# Patient Record
Sex: Female | Born: 1940 | Race: White | Hispanic: No | Marital: Married | Smoking: Current some day smoker
Health system: Southern US, Community
[De-identification: ages and names within clinical notes are randomized; demographics above are authoritative.]

## PROBLEM LIST (undated history)

## (undated) DIAGNOSIS — Z860100 Personal history of colon polyps, unspecified: Secondary | ICD-10-CM

## (undated) DIAGNOSIS — F329 Major depressive disorder, single episode, unspecified: Secondary | ICD-10-CM

## (undated) DIAGNOSIS — R0602 Shortness of breath: Secondary | ICD-10-CM

## (undated) DIAGNOSIS — K56600 Partial intestinal obstruction, unspecified as to cause: Secondary | ICD-10-CM

## (undated) DIAGNOSIS — K219 Gastro-esophageal reflux disease without esophagitis: Secondary | ICD-10-CM

## (undated) DIAGNOSIS — M1990 Unspecified osteoarthritis, unspecified site: Secondary | ICD-10-CM

## (undated) DIAGNOSIS — I639 Cerebral infarction, unspecified: Secondary | ICD-10-CM

## (undated) DIAGNOSIS — J449 Chronic obstructive pulmonary disease, unspecified: Secondary | ICD-10-CM

## (undated) DIAGNOSIS — Z853 Personal history of malignant neoplasm of breast: Secondary | ICD-10-CM

## (undated) DIAGNOSIS — R011 Cardiac murmur, unspecified: Secondary | ICD-10-CM

## (undated) DIAGNOSIS — C50919 Malignant neoplasm of unspecified site of unspecified female breast: Secondary | ICD-10-CM

## (undated) DIAGNOSIS — Z515 Encounter for palliative care: Secondary | ICD-10-CM

## (undated) DIAGNOSIS — I341 Nonrheumatic mitral (valve) prolapse: Secondary | ICD-10-CM

## (undated) DIAGNOSIS — F32A Depression, unspecified: Secondary | ICD-10-CM

## (undated) DIAGNOSIS — R188 Other ascites: Secondary | ICD-10-CM

## (undated) DIAGNOSIS — I499 Cardiac arrhythmia, unspecified: Secondary | ICD-10-CM

## (undated) DIAGNOSIS — C569 Malignant neoplasm of unspecified ovary: Secondary | ICD-10-CM

## (undated) DIAGNOSIS — F419 Anxiety disorder, unspecified: Secondary | ICD-10-CM

## (undated) DIAGNOSIS — C801 Malignant (primary) neoplasm, unspecified: Secondary | ICD-10-CM

## (undated) HISTORY — DX: Chronic obstructive pulmonary disease, unspecified: J44.9

## (undated) HISTORY — DX: Partial intestinal obstruction, unspecified as to cause: K56.600

## (undated) HISTORY — PX: TYMPANOPLASTY: SHX33

## (undated) HISTORY — DX: Gastro-esophageal reflux disease without esophagitis: K21.9

## (undated) HISTORY — DX: Personal history of colonic polyps: Z86.010

## (undated) HISTORY — DX: Malignant (primary) neoplasm, unspecified: C80.1

## (undated) HISTORY — DX: Cerebral infarction, unspecified: I63.9

## (undated) HISTORY — DX: Other ascites: R18.8

## (undated) HISTORY — PX: LAPAROSCOPY: SHX197

## (undated) HISTORY — DX: Unspecified osteoarthritis, unspecified site: M19.90

## (undated) HISTORY — PX: OTHER SURGICAL HISTORY: SHX169

## (undated) HISTORY — DX: Personal history of colon polyps, unspecified: Z86.0100

## (undated) HISTORY — DX: Personal history of malignant neoplasm of breast: Z85.3

---
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
## 1991-08-06 DIAGNOSIS — C50919 Malignant neoplasm of unspecified site of unspecified female breast: Secondary | ICD-10-CM

## 1991-08-06 HISTORY — DX: Malignant neoplasm of unspecified site of unspecified female breast: C50.919

## 1998-11-13 ENCOUNTER — Other Ambulatory Visit: Admission: RE | Admit: 1998-11-13 | Discharge: 1998-11-13 | Payer: Self-pay | Admitting: Obstetrics & Gynecology

## 2000-01-25 ENCOUNTER — Other Ambulatory Visit: Admission: RE | Admit: 2000-01-25 | Discharge: 2000-01-25 | Payer: Self-pay | Admitting: Obstetrics & Gynecology

## 2000-03-10 ENCOUNTER — Encounter: Admission: RE | Admit: 2000-03-10 | Discharge: 2000-03-10 | Payer: Self-pay | Admitting: Oncology

## 2000-03-10 ENCOUNTER — Encounter: Payer: Self-pay | Admitting: Oncology

## 2001-03-23 ENCOUNTER — Encounter: Payer: Self-pay | Admitting: Oncology

## 2001-03-23 ENCOUNTER — Encounter: Admission: RE | Admit: 2001-03-23 | Discharge: 2001-03-23 | Payer: Self-pay | Admitting: Oncology

## 2001-04-13 ENCOUNTER — Ambulatory Visit (HOSPITAL_COMMUNITY): Admission: RE | Admit: 2001-04-13 | Discharge: 2001-04-13 | Payer: Self-pay | Admitting: Oncology

## 2001-04-13 ENCOUNTER — Encounter: Payer: Self-pay | Admitting: Oncology

## 2001-06-03 ENCOUNTER — Other Ambulatory Visit: Admission: RE | Admit: 2001-06-03 | Discharge: 2001-06-03 | Payer: Self-pay | Admitting: Obstetrics & Gynecology

## 2002-03-24 ENCOUNTER — Encounter: Admission: RE | Admit: 2002-03-24 | Discharge: 2002-03-24 | Payer: Self-pay | Admitting: Oncology

## 2002-03-24 ENCOUNTER — Encounter: Payer: Self-pay | Admitting: Oncology

## 2002-06-23 ENCOUNTER — Other Ambulatory Visit: Admission: RE | Admit: 2002-06-23 | Discharge: 2002-06-23 | Payer: Self-pay | Admitting: Obstetrics & Gynecology

## 2003-09-13 ENCOUNTER — Other Ambulatory Visit: Admission: RE | Admit: 2003-09-13 | Discharge: 2003-09-13 | Payer: Self-pay | Admitting: Obstetrics & Gynecology

## 2003-09-19 ENCOUNTER — Encounter: Admission: RE | Admit: 2003-09-19 | Discharge: 2003-09-19 | Payer: Self-pay | Admitting: Obstetrics & Gynecology

## 2004-10-22 ENCOUNTER — Ambulatory Visit: Payer: Self-pay | Admitting: Internal Medicine

## 2005-01-22 ENCOUNTER — Ambulatory Visit: Payer: Self-pay | Admitting: Internal Medicine

## 2005-02-07 ENCOUNTER — Encounter: Admission: RE | Admit: 2005-02-07 | Discharge: 2005-02-07 | Payer: Self-pay | Admitting: Obstetrics & Gynecology

## 2005-02-12 ENCOUNTER — Other Ambulatory Visit: Admission: RE | Admit: 2005-02-12 | Discharge: 2005-02-12 | Payer: Self-pay | Admitting: Obstetrics & Gynecology

## 2005-04-12 ENCOUNTER — Ambulatory Visit: Payer: Self-pay | Admitting: Internal Medicine

## 2005-04-24 ENCOUNTER — Ambulatory Visit: Payer: Self-pay | Admitting: Internal Medicine

## 2005-08-21 ENCOUNTER — Ambulatory Visit: Payer: Self-pay | Admitting: Internal Medicine

## 2005-09-02 ENCOUNTER — Ambulatory Visit: Payer: Self-pay | Admitting: Internal Medicine

## 2005-12-13 ENCOUNTER — Ambulatory Visit: Payer: Self-pay | Admitting: Internal Medicine

## 2005-12-16 ENCOUNTER — Ambulatory Visit: Payer: Self-pay | Admitting: Internal Medicine

## 2006-03-28 ENCOUNTER — Ambulatory Visit: Payer: Self-pay | Admitting: Internal Medicine

## 2006-04-02 ENCOUNTER — Ambulatory Visit: Payer: Self-pay | Admitting: Internal Medicine

## 2006-06-06 ENCOUNTER — Ambulatory Visit: Payer: Self-pay | Admitting: Internal Medicine

## 2006-07-01 ENCOUNTER — Ambulatory Visit: Payer: Self-pay | Admitting: Internal Medicine

## 2006-07-01 LAB — CONVERTED CEMR LAB
ALT: 23 units/L (ref 0–40)
AST: 24 units/L (ref 0–37)
Alkaline Phosphatase: 69 units/L (ref 39–117)
Chloride: 102 meq/L (ref 96–112)
Glomerular Filtration Rate, Af Am: 81 mL/min/{1.73_m2}
Glucose, Bld: 162 mg/dL — ABNORMAL HIGH (ref 70–99)
HDL: 54.9 mg/dL (ref >39.0)
Potassium: 5 meq/L (ref 3.5–5.1)
Sodium: 139 meq/L (ref 135–145)
Total Bilirubin: 0.5 mg/dL (ref 0.3–1.2)
Total Protein: 6.8 g/dL (ref 6.0–8.3)
VLDL: 28 mg/dL (ref 0–40)

## 2006-07-02 ENCOUNTER — Ambulatory Visit: Payer: Self-pay | Admitting: Internal Medicine

## 2006-09-30 ENCOUNTER — Ambulatory Visit: Payer: Self-pay | Admitting: Internal Medicine

## 2006-09-30 LAB — CONVERTED CEMR LAB
ALT: 20 units/L (ref 0–40)
AST: 24 units/L (ref 0–37)
Albumin: 3.7 g/dL (ref 3.5–5.2)
BUN: 10 mg/dL (ref 6–23)
Calcium: 9.4 mg/dL (ref 8.4–10.5)
Chloride: 104 meq/L (ref 96–112)
GFR calc Af Amer: 109 mL/min
GFR calc non Af Amer: 90 mL/min
LDL Cholesterol: 65 mg/dL (ref 0–99)
Total CHOL/HDL Ratio: 2.9
VLDL: 28 mg/dL (ref 0–40)
Vit D, 1,25-Dihydroxy: 48 (ref 20–57)

## 2006-10-03 ENCOUNTER — Ambulatory Visit: Payer: Self-pay | Admitting: Internal Medicine

## 2006-10-20 ENCOUNTER — Ambulatory Visit: Payer: Self-pay

## 2007-01-06 ENCOUNTER — Ambulatory Visit: Payer: Self-pay | Admitting: Internal Medicine

## 2007-01-06 LAB — CONVERTED CEMR LAB
ALT: 20 units/L (ref 0–40)
Alkaline Phosphatase: 70 units/L (ref 39–117)
Bilirubin, Direct: 0.1 mg/dL (ref 0.0–0.3)
Cholesterol: 165 mg/dL (ref 0–200)
HDL: 53.7 mg/dL (ref >39.0)
LDL Cholesterol: 84 mg/dL (ref 0–99)
Total Bilirubin: 0.6 mg/dL (ref 0.3–1.2)
Total CHOL/HDL Ratio: 3.1
Total Protein: 7 g/dL (ref 6.0–8.3)

## 2007-01-09 ENCOUNTER — Ambulatory Visit: Payer: Self-pay | Admitting: Internal Medicine

## 2007-02-17 ENCOUNTER — Encounter: Admission: RE | Admit: 2007-02-17 | Discharge: 2007-02-17 | Payer: Self-pay | Admitting: Obstetrics & Gynecology

## 2007-04-10 ENCOUNTER — Ambulatory Visit: Payer: Self-pay | Admitting: Internal Medicine

## 2007-04-10 LAB — CONVERTED CEMR LAB
BUN: 14 mg/dL (ref 6–23)
CO2: 27 meq/L (ref 19–32)
Calcium: 9.8 mg/dL (ref 8.4–10.5)
GFR calc Af Amer: 108 mL/min
GFR calc non Af Amer: 90 mL/min
Glucose, Bld: 130 mg/dL — ABNORMAL HIGH (ref 70–99)
Potassium: 4.6 meq/L (ref 3.5–5.1)

## 2007-04-14 ENCOUNTER — Ambulatory Visit: Payer: Self-pay | Admitting: Internal Medicine

## 2007-05-14 ENCOUNTER — Ambulatory Visit: Payer: Self-pay | Admitting: Internal Medicine

## 2007-06-16 ENCOUNTER — Encounter: Payer: Self-pay | Admitting: Internal Medicine

## 2007-06-24 ENCOUNTER — Encounter: Payer: Self-pay | Admitting: Internal Medicine

## 2007-06-24 DIAGNOSIS — K219 Gastro-esophageal reflux disease without esophagitis: Secondary | ICD-10-CM

## 2007-06-24 DIAGNOSIS — J449 Chronic obstructive pulmonary disease, unspecified: Secondary | ICD-10-CM

## 2007-06-24 DIAGNOSIS — Z853 Personal history of malignant neoplasm of breast: Secondary | ICD-10-CM

## 2007-06-24 DIAGNOSIS — J4489 Other specified chronic obstructive pulmonary disease: Secondary | ICD-10-CM | POA: Insufficient documentation

## 2007-07-20 ENCOUNTER — Ambulatory Visit: Payer: Self-pay | Admitting: Internal Medicine

## 2007-07-20 DIAGNOSIS — E119 Type 2 diabetes mellitus without complications: Secondary | ICD-10-CM

## 2007-07-20 LAB — CONVERTED CEMR LAB
ALT: 22 units/L (ref 0–35)
AST: 19 units/L (ref 0–37)
Albumin: 3.8 g/dL (ref 3.5–5.2)
Alkaline Phosphatase: 66 units/L (ref 39–117)
BUN: 12 mg/dL (ref 6–23)
CO2: 27 meq/L (ref 19–32)
Chloride: 103 meq/L (ref 96–112)
Creatinine, Ser: 0.8 mg/dL (ref 0.4–1.2)
Direct LDL: 94.7 mg/dL
HDL: 56.7 mg/dL (ref >39.0)
Total Bilirubin: 0.7 mg/dL (ref 0.3–1.2)
Total Protein: 6.3 g/dL (ref 6.0–8.3)
VLDL: 50 mg/dL — ABNORMAL HIGH (ref 0–40)

## 2007-07-22 ENCOUNTER — Ambulatory Visit: Payer: Self-pay | Admitting: Internal Medicine

## 2007-07-22 DIAGNOSIS — Z860100 Personal history of colon polyps, unspecified: Secondary | ICD-10-CM | POA: Insufficient documentation

## 2007-08-24 ENCOUNTER — Encounter: Payer: Self-pay | Admitting: Internal Medicine

## 2007-09-04 ENCOUNTER — Encounter: Payer: Self-pay | Admitting: Internal Medicine

## 2007-10-16 ENCOUNTER — Encounter: Payer: Self-pay | Admitting: Internal Medicine

## 2007-11-20 ENCOUNTER — Telehealth: Payer: Self-pay | Admitting: Internal Medicine

## 2007-11-30 ENCOUNTER — Ambulatory Visit: Payer: Self-pay | Admitting: Internal Medicine

## 2007-12-02 ENCOUNTER — Ambulatory Visit: Payer: Self-pay | Admitting: Internal Medicine

## 2007-12-02 LAB — CONVERTED CEMR LAB
Calcium: 9.2 mg/dL (ref 8.4–10.5)
GFR calc Af Amer: 93 mL/min
GFR calc non Af Amer: 77 mL/min
Glucose, Bld: 96 mg/dL (ref 70–99)
HDL: 55.4 mg/dL (ref >39.0)
LDL Cholesterol: 86 mg/dL (ref 0–99)
Potassium: 4.3 meq/L (ref 3.5–5.1)
Sodium: 139 meq/L (ref 135–145)
Total CHOL/HDL Ratio: 3.1
VLDL: 29 mg/dL (ref 0–40)

## 2007-12-03 LAB — CONVERTED CEMR LAB
Bilirubin Urine: NEGATIVE
Hemoglobin, Urine: NEGATIVE
Nitrite: NEGATIVE
Total Protein, Urine: NEGATIVE mg/dL
Urobilinogen, UA: 0.2 (ref 0.0–1.0)

## 2007-12-07 ENCOUNTER — Ambulatory Visit: Payer: Self-pay | Admitting: Internal Medicine

## 2007-12-09 ENCOUNTER — Telehealth: Payer: Self-pay | Admitting: Internal Medicine

## 2007-12-10 ENCOUNTER — Encounter (INDEPENDENT_AMBULATORY_CARE_PROVIDER_SITE_OTHER): Payer: Self-pay | Admitting: *Deleted

## 2008-01-11 ENCOUNTER — Encounter: Payer: Self-pay | Admitting: Internal Medicine

## 2008-01-14 ENCOUNTER — Encounter: Payer: Self-pay | Admitting: Internal Medicine

## 2008-01-19 ENCOUNTER — Encounter: Admission: RE | Admit: 2008-01-19 | Discharge: 2008-01-19 | Payer: Self-pay | Admitting: Surgery

## 2008-01-20 ENCOUNTER — Ambulatory Visit: Payer: Self-pay | Admitting: Internal Medicine

## 2008-01-20 DIAGNOSIS — E785 Hyperlipidemia, unspecified: Secondary | ICD-10-CM

## 2008-01-20 DIAGNOSIS — K8020 Calculus of gallbladder without cholecystitis without obstruction: Secondary | ICD-10-CM | POA: Insufficient documentation

## 2008-01-20 DIAGNOSIS — I059 Rheumatic mitral valve disease, unspecified: Secondary | ICD-10-CM | POA: Insufficient documentation

## 2008-01-22 ENCOUNTER — Encounter: Payer: Self-pay | Admitting: Internal Medicine

## 2008-01-22 ENCOUNTER — Ambulatory Visit: Payer: Self-pay | Admitting: Internal Medicine

## 2008-01-22 LAB — HM COLONOSCOPY

## 2008-01-25 ENCOUNTER — Encounter: Payer: Self-pay | Admitting: Internal Medicine

## 2008-01-27 ENCOUNTER — Encounter: Payer: Self-pay | Admitting: Internal Medicine

## 2008-02-25 ENCOUNTER — Encounter: Payer: Self-pay | Admitting: Internal Medicine

## 2008-02-26 ENCOUNTER — Ambulatory Visit: Payer: Self-pay | Admitting: Internal Medicine

## 2008-02-26 LAB — CONVERTED CEMR LAB
CO2: 27 meq/L (ref 19–32)
Calcium: 10.2 mg/dL (ref 8.4–10.5)
Creatinine, Ser: 0.8 mg/dL (ref 0.4–1.2)
GFR calc Af Amer: 93 mL/min
Glucose, Bld: 137 mg/dL — ABNORMAL HIGH (ref 70–99)
Hgb A1c MFr Bld: 7.1 % — ABNORMAL HIGH (ref 4.6–6.0)

## 2008-03-02 ENCOUNTER — Ambulatory Visit: Payer: Self-pay | Admitting: Internal Medicine

## 2008-04-25 ENCOUNTER — Ambulatory Visit: Payer: Self-pay | Admitting: Internal Medicine

## 2008-04-25 ENCOUNTER — Inpatient Hospital Stay (HOSPITAL_COMMUNITY): Admission: AD | Admit: 2008-04-25 | Discharge: 2008-04-29 | Payer: Self-pay | Admitting: Internal Medicine

## 2008-04-25 ENCOUNTER — Ambulatory Visit: Payer: Self-pay | Admitting: Cardiology

## 2008-04-25 DIAGNOSIS — R0602 Shortness of breath: Secondary | ICD-10-CM

## 2008-04-25 DIAGNOSIS — R05 Cough: Secondary | ICD-10-CM | POA: Insufficient documentation

## 2008-04-26 ENCOUNTER — Encounter: Payer: Self-pay | Admitting: Cardiology

## 2008-04-30 ENCOUNTER — Telehealth: Payer: Self-pay | Admitting: Internal Medicine

## 2008-05-06 ENCOUNTER — Encounter: Payer: Self-pay | Admitting: Internal Medicine

## 2008-05-09 ENCOUNTER — Ambulatory Visit: Payer: Self-pay | Admitting: Internal Medicine

## 2008-05-09 DIAGNOSIS — F17200 Nicotine dependence, unspecified, uncomplicated: Secondary | ICD-10-CM | POA: Insufficient documentation

## 2008-05-19 ENCOUNTER — Encounter: Payer: Self-pay | Admitting: Internal Medicine

## 2008-06-01 ENCOUNTER — Encounter: Admission: RE | Admit: 2008-06-01 | Discharge: 2008-06-01 | Payer: Self-pay | Admitting: Obstetrics & Gynecology

## 2008-06-27 ENCOUNTER — Telehealth: Payer: Self-pay | Admitting: Internal Medicine

## 2008-07-27 ENCOUNTER — Telehealth: Payer: Self-pay | Admitting: Internal Medicine

## 2008-08-08 ENCOUNTER — Telehealth: Payer: Self-pay | Admitting: Internal Medicine

## 2008-08-11 ENCOUNTER — Encounter: Payer: Self-pay | Admitting: Internal Medicine

## 2008-08-15 ENCOUNTER — Ambulatory Visit: Payer: Self-pay | Admitting: Internal Medicine

## 2008-08-15 LAB — CONVERTED CEMR LAB
CO2: 28 meq/L (ref 19–32)
Calcium: 9.6 mg/dL (ref 8.4–10.5)
Creatinine, Ser: 0.7 mg/dL (ref 0.4–1.2)
Glucose, Bld: 127 mg/dL — ABNORMAL HIGH (ref 70–99)

## 2008-08-16 ENCOUNTER — Ambulatory Visit: Payer: Self-pay | Admitting: Internal Medicine

## 2008-11-14 ENCOUNTER — Ambulatory Visit: Payer: Self-pay | Admitting: Internal Medicine

## 2008-11-16 ENCOUNTER — Ambulatory Visit: Payer: Self-pay | Admitting: Internal Medicine

## 2008-11-16 LAB — CONVERTED CEMR LAB
BUN: 14 mg/dL (ref 6–23)
CO2: 28 meq/L (ref 19–32)
Chloride: 106 meq/L (ref 96–112)
Creatinine, Ser: 0.8 mg/dL (ref 0.4–1.2)
Nitrite: NEGATIVE
Specific Gravity, Urine: 1.02 (ref 1.000–1.030)
Total Protein, Urine: NEGATIVE mg/dL
pH: 6 (ref 5.0–8.0)

## 2008-11-18 ENCOUNTER — Telehealth: Payer: Self-pay | Admitting: Internal Medicine

## 2009-03-01 ENCOUNTER — Telehealth (INDEPENDENT_AMBULATORY_CARE_PROVIDER_SITE_OTHER): Payer: Self-pay | Admitting: *Deleted

## 2009-03-20 ENCOUNTER — Ambulatory Visit: Payer: Self-pay | Admitting: Internal Medicine

## 2009-03-20 LAB — CONVERTED CEMR LAB
Bilirubin, Direct: 0.1 mg/dL (ref 0.0–0.3)
CO2: 29 meq/L (ref 19–32)
Calcium: 9.2 mg/dL (ref 8.4–10.5)
Creatinine, Ser: 0.7 mg/dL (ref 0.4–1.2)
Total Protein: 6.8 g/dL (ref 6.0–8.3)

## 2009-03-21 ENCOUNTER — Ambulatory Visit: Payer: Self-pay | Admitting: Internal Medicine

## 2009-04-12 ENCOUNTER — Encounter: Payer: Self-pay | Admitting: Internal Medicine

## 2009-04-28 ENCOUNTER — Telehealth: Payer: Self-pay | Admitting: Internal Medicine

## 2009-05-03 ENCOUNTER — Ambulatory Visit: Payer: Self-pay | Admitting: Internal Medicine

## 2009-06-05 ENCOUNTER — Encounter: Admission: RE | Admit: 2009-06-05 | Discharge: 2009-06-05 | Payer: Self-pay | Admitting: Obstetrics & Gynecology

## 2009-06-20 ENCOUNTER — Ambulatory Visit: Payer: Self-pay | Admitting: Internal Medicine

## 2009-06-20 LAB — CONVERTED CEMR LAB
GFR calc non Af Amer: 88.91 mL/min (ref >60)
Glucose, Bld: 97 mg/dL (ref 70–99)
Hgb A1c MFr Bld: 6.8 % — ABNORMAL HIGH (ref 4.6–6.5)
Potassium: 4.1 meq/L (ref 3.5–5.1)
Sodium: 142 meq/L (ref 135–145)

## 2009-06-23 ENCOUNTER — Ambulatory Visit: Payer: Self-pay | Admitting: Internal Medicine

## 2009-06-23 DIAGNOSIS — M1990 Unspecified osteoarthritis, unspecified site: Secondary | ICD-10-CM | POA: Insufficient documentation

## 2009-06-23 DIAGNOSIS — J310 Chronic rhinitis: Secondary | ICD-10-CM

## 2009-08-31 ENCOUNTER — Telehealth: Payer: Self-pay | Admitting: Internal Medicine

## 2009-09-01 ENCOUNTER — Telehealth: Payer: Self-pay | Admitting: Internal Medicine

## 2009-09-21 ENCOUNTER — Telehealth: Payer: Self-pay | Admitting: Internal Medicine

## 2009-09-29 ENCOUNTER — Ambulatory Visit: Payer: Self-pay | Admitting: Internal Medicine

## 2009-09-29 LAB — CONVERTED CEMR LAB
GFR calc non Af Amer: 88.84 mL/min (ref >60)
Glucose, Bld: 117 mg/dL — ABNORMAL HIGH (ref 70–99)
Hgb A1c MFr Bld: 7.1 % — ABNORMAL HIGH (ref 4.6–6.5)
Potassium: 4 meq/L (ref 3.5–5.1)
Sodium: 140 meq/L (ref 135–145)
Vitamin B-12: 370 pg/mL (ref 211–911)

## 2009-10-04 ENCOUNTER — Ambulatory Visit: Payer: Self-pay | Admitting: Internal Medicine

## 2010-01-16 ENCOUNTER — Ambulatory Visit: Payer: Self-pay | Admitting: Internal Medicine

## 2010-01-16 LAB — CONVERTED CEMR LAB
AST: 22 units/L (ref 0–37)
Alkaline Phosphatase: 67 units/L (ref 39–117)
Bilirubin, Direct: 0.1 mg/dL (ref 0.0–0.3)
Direct LDL: 108 mg/dL
GFR calc non Af Amer: 85.92 mL/min (ref >60)
Potassium: 4.5 meq/L (ref 3.5–5.1)
Sodium: 143 meq/L (ref 135–145)
Total Bilirubin: 0.4 mg/dL (ref 0.3–1.2)
Total CHOL/HDL Ratio: 3
VLDL: 68.4 mg/dL — ABNORMAL HIGH (ref 0.0–40.0)

## 2010-01-17 ENCOUNTER — Ambulatory Visit: Payer: Self-pay | Admitting: Internal Medicine

## 2010-01-17 DIAGNOSIS — J209 Acute bronchitis, unspecified: Secondary | ICD-10-CM

## 2010-04-30 ENCOUNTER — Telehealth: Payer: Self-pay | Admitting: Internal Medicine

## 2010-05-09 ENCOUNTER — Encounter: Payer: Self-pay | Admitting: Internal Medicine

## 2010-05-11 ENCOUNTER — Ambulatory Visit: Payer: Self-pay | Admitting: Internal Medicine

## 2010-05-11 LAB — CONVERTED CEMR LAB
Calcium: 9.7 mg/dL (ref 8.4–10.5)
Creatinine, Ser: 0.8 mg/dL (ref 0.4–1.2)
GFR calc non Af Amer: 79.43 mL/min (ref >60)
Sodium: 139 meq/L (ref 135–145)

## 2010-05-14 ENCOUNTER — Ambulatory Visit: Payer: Self-pay | Admitting: Internal Medicine

## 2010-05-14 ENCOUNTER — Encounter: Payer: Self-pay | Admitting: Internal Medicine

## 2010-05-28 ENCOUNTER — Encounter: Payer: Self-pay | Admitting: Internal Medicine

## 2010-06-06 ENCOUNTER — Encounter: Admission: RE | Admit: 2010-06-06 | Discharge: 2010-06-06 | Payer: Self-pay | Admitting: Obstetrics & Gynecology

## 2010-06-21 ENCOUNTER — Encounter: Payer: Self-pay | Admitting: Internal Medicine

## 2010-08-22 ENCOUNTER — Encounter: Payer: Self-pay | Admitting: Internal Medicine

## 2010-08-28 ENCOUNTER — Ambulatory Visit
Admission: RE | Admit: 2010-08-28 | Discharge: 2010-08-28 | Payer: Self-pay | Source: Home / Self Care | Attending: Internal Medicine | Admitting: Internal Medicine

## 2010-08-28 ENCOUNTER — Encounter (INDEPENDENT_AMBULATORY_CARE_PROVIDER_SITE_OTHER): Payer: Self-pay | Admitting: *Deleted

## 2010-08-28 DIAGNOSIS — R0989 Other specified symptoms and signs involving the circulatory and respiratory systems: Secondary | ICD-10-CM | POA: Insufficient documentation

## 2010-08-30 ENCOUNTER — Ambulatory Visit
Admission: RE | Admit: 2010-08-30 | Discharge: 2010-08-30 | Payer: Self-pay | Source: Home / Self Care | Attending: Internal Medicine | Admitting: Internal Medicine

## 2010-08-30 ENCOUNTER — Other Ambulatory Visit: Payer: Self-pay | Admitting: Internal Medicine

## 2010-08-30 LAB — CBC WITH DIFFERENTIAL/PLATELET
Basophils Absolute: 0 10*3/uL (ref 0.0–0.1)
Basophils Relative: 0.5 % (ref 0.0–3.0)
Hemoglobin: 11.9 g/dL — ABNORMAL LOW (ref 12.0–15.0)
Lymphocytes Relative: 29.2 % (ref 12.0–46.0)
Monocytes Relative: 7.9 % (ref 3.0–12.0)
Neutro Abs: 6.1 10*3/uL (ref 1.4–7.7)
RBC: 4.05 Mil/uL (ref 3.87–5.11)

## 2010-08-30 LAB — TSH: TSH: 3.3 u[IU]/mL (ref 0.35–5.50)

## 2010-08-30 LAB — URINALYSIS
Ketones, ur: NEGATIVE
Leukocytes, UA: NEGATIVE
Specific Gravity, Urine: 1.01 (ref 1.000–1.030)
Urine Glucose: NEGATIVE
pH: 7 (ref 5.0–8.0)

## 2010-08-30 LAB — BASIC METABOLIC PANEL
CO2: 29 mEq/L (ref 19–32)
Chloride: 101 mEq/L (ref 96–112)
Sodium: 138 mEq/L (ref 135–145)

## 2010-08-30 LAB — LIPID PANEL: Total CHOL/HDL Ratio: 3

## 2010-08-30 LAB — MICROALBUMIN / CREATININE URINE RATIO
Creatinine,U: 45.3 mg/dL
Microalb, Ur: 1 mg/dL (ref 0.0–1.9)

## 2010-08-30 LAB — HEPATIC FUNCTION PANEL
ALT: 23 U/L (ref 0–35)
AST: 20 U/L (ref 0–37)
Alkaline Phosphatase: 67 U/L (ref 39–117)
Bilirubin, Direct: 0.1 mg/dL (ref 0.0–0.3)
Total Protein: 6.9 g/dL (ref 6.0–8.3)

## 2010-08-30 LAB — HEMOGLOBIN A1C: Hgb A1c MFr Bld: 7.5 % — ABNORMAL HIGH (ref 4.6–6.5)

## 2010-09-04 ENCOUNTER — Encounter: Payer: Self-pay | Admitting: Internal Medicine

## 2010-09-04 NOTE — Assessment & Plan Note (Signed)
Summary: 4 mo rov /nws  #   Vital Signs:  Patient profile:   70 year old female Height:      64 inches Weight:      137.25 pounds BMI:     23.64 O2 Sat:      91 % on Room air Temp:     97.5 degrees F oral Pulse rate:   88 / minute BP sitting:   110 / 64  (left arm) Cuff size:   regular  Vitals Entered By: Lucious Groves (January 17, 2010 1:26 PM)  O2 Flow:  Room air CC: 4 mo rtn ov./kb Is Patient Diabetic? No Pain Assessment Patient in pain? no      Comments Patient states that she did not know she was supposed to be taking Vitamin D./kb   Primary Care Provider:  Dr Trinna Post Hannah Crill  CC:  4 mo rtn ov./kb.  History of Present Illness: The patient presents for a follow up of hypertension, diabetes, hyperlipidemia C/o cough and nasal drainage.  Current Medications (verified): 1)  Lovastatin 40 Mg  Tabs (Lovastatin) .... Take 1 By Mouth Qd 2)  Tenormin 50 Mg  Tabs (Atenolol) .... 1/2 Two Times A Day 3)  Zoloft 100 Mg  Tabs (Sertraline Hcl) .... Once Daily 4)  Vitamin-B Complex   Tabs (B Complex Vitamins) .... Once Daily 5)  Baby Aspirin 81 Mg  Chew (Aspirin) .... Once Daily 6)  Buspar 15 Mg  Tabs (Buspirone Hcl) .... Two Times A Day 7)  Xanax 0.5 Mg  Tabs (Alprazolam) .... Two Times A Day As Needed 8)  Prandin 2 Mg  Tabs (Repaglinide) .... Three Times A Day As Needed 9)  Omeprazole 20 Mg Cpdr (Omeprazole) .... Take 2  By Mouth  A Day 10)  Duoneb 0.5-2.5 (3) Mg/42ml Soln (Ipratropium-Albuterol) .Marland Kitchen.. 1 Via Hhn Qid Dx 491.2 Copd 11)  Metformin Hcl 1000 Mg Tabs (Metformin Hcl) .... Take 1 Tablet By Mouth Two Times A Day 12)  Wellbutrin Sr 150 Mg Xr12h-Tab (Bupropion Hcl) .Marland Kitchen.. 1 By Mouth Bid 13)  Pennsaid 1.5 % Soln (Diclofenac Sodium) .... 3-5 Gtt To Skin Three Times Daily As Needed 14)  Vitamin D3 1000 Unit  Tabs (Cholecalciferol) .Marland Kitchen.. 1 By Mouth Daily 15)  Combivent 103-18 Mcg/act Aero (Ipratropium-Albuterol) .... 2 Inh Qid Prn 16)  Onetouch Test  Strp (Glucose Blood) .... Use  Bid 17)  Prednisone 10 Mg Tabs (Prednisone) .... Take 40mg  Qd For 3 Days, Then 20 Mg Qd For 3 Days, Then 10mg  Qd For 6 Days, Then Stop. Take Pc.  Allergies (verified): 1)  ! 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   Review of Systems  The patient denies fever, syncope, prolonged cough, and abdominal pain.    Physical Exam  General:  Well-developed,well-nourished,in no acute distress; alert,appropriate and cooperative throughout examination Mouth:  no lesions Neck:  supple, no lymphodenomathies, no masses, thyromegaly  Lungs:  clear bilaterally, no wheezes, rhonchi or crackles Heart:  RRR Abdomen:  soft, non-tender, no organomegalies, no masses Msk:  No deformity or scoliosis noted of thoracic or lumbar spine.   Neurologic:  No cranial nerve deficits noted. Station and gait are normal. Plantar reflexes are down-going bilaterally. DTRs are symmetrical throughout. Sensory, motor and coordinative functions appear intact. Skin:  Intact without suspicious lesions or rashes Psych:  Cognition and judgment appear intact. Alert and cooperative with normal attention span and concentration. No apparent delusions, illusions, hallucinations; not suicidal and depressed affect.     Impression & Recommendations:  Problem #  1:  DIABETES MELLITUS, TYPE II (ICD-250.00) Assessment Unchanged  Her updated medication list for this problem includes:    Baby Aspirin 81 Mg Chew (Aspirin) ..... Once daily    Prandin 2 Mg Tabs (Repaglinide) .Marland Kitchen... Three times a day as needed    Metformin Hcl 1000 Mg Tabs (Metformin hcl) .Marland Kitchen... Take 1 tablet by mouth two times a day  Problem # 2:  COPD (ICD-496) Assessment: Unchanged  Her updated medication list for this problem includes:    Duoneb 0.5-2.5 (3) Mg/46ml Soln (Ipratropium-albuterol) .Marland Kitchen... 1 via hhn qid dx 491.2 copd    Combivent 103-18 Mcg/act Aero (Ipratropium-albuterol) .Marland Kitchen... 2 inh qid prn  Problem # 3:  GERD (ICD-530.81) Assessment: Unchanged  Her updated medication list for this problem includes:    Omeprazole 20 Mg Cpdr (Omeprazole) .Marland Kitchen... Take 2  by mouth  a day  Problem # 4:  OSTEOARTHRITIS (ICD-715.90) Assessment: Unchanged  Her updated medication list for this problem includes:    Baby Aspirin 81 Mg Chew (Aspirin) ..... Once daily  Problem # 5:  BREAST CANCER, HX OF (ICD-V10.3) Assessment: Comment Only  Complete Medication List: 1)  Lovastatin 40 Mg Tabs (Lovastatin) .... Take 1 by mouth qd 2)  Tenormin 50 Mg Tabs (Atenolol) .... 1/2 two times a day 3)  Zoloft 100 Mg Tabs (Sertraline hcl) .... Once daily 4)  Vitamin-b Complex Tabs (B complex vitamins) .... Once daily 5)  Baby Aspirin 81 Mg Chew (Aspirin) .... Once daily 6)  Buspar 15 Mg Tabs (Buspirone hcl) .... Two times a day 7)  Xanax 0.5 Mg Tabs (Alprazolam) .... Two times a day as needed 8)  Prandin 2 Mg Tabs (Repaglinide) .... Three times a day as needed 9)  Omeprazole 20 Mg Cpdr (Omeprazole) .... Take 2  by mouth  a day 10)  Duoneb 0.5-2.5 (3) Mg/54ml Soln (Ipratropium-albuterol) .Marland Kitchen.. 1 via hhn qid dx 491.2 copd 11)  Metformin Hcl 1000 Mg Tabs (Metformin hcl) .... Take 1 tablet by mouth two times a day 12)  Wellbutrin Sr 150 Mg Xr12h-tab (Bupropion hcl) .Marland Kitchen.. 1 by mouth bid 13)  Pennsaid 1.5 % Soln  (Diclofenac sodium) .... 3-5 gtt to skin three times daily as needed 14)  Vitamin D3 1000 Unit Tabs (Cholecalciferol) .Marland Kitchen.. 1 by mouth daily 15)  Combivent 103-18 Mcg/act Aero (Ipratropium-albuterol) .... 2 inh qid prn 16)  Onetouch Test Strp (Glucose blood) .... Use bid 17)  Prednisone 10 Mg Tabs (Prednisone) .... Take 40mg  qd for 3 days, then 20 mg qd for 3 days, then 10mg  qd for 6 days, then stop. take pc. 18)  Augmentin 875-125 Mg Tabs (Amoxicillin-pot clavulanate) .Marland Kitchen.. 1 by mouth bid  Other Orders: T-2 View CXR (71020TC)  Patient Instructions: 1)  Please schedule a follow-up appointment in 4 months. 2)  BMP prior to visit, ICD-9: 3)  HbgA1C prior to visit, ICD-9: 250.00  Prescriptions: AUGMENTIN 875-125 MG TABS (AMOXICILLIN-POT CLAVULANATE) 1 by mouth bid  #20 x 0   Entered and Authorized by:   Tresa Garter MD   Signed by:   Tresa Garter MD on 01/17/2010   Method used:   Electronically to        [facility]* (retail)       [address] [place], [place]  [address]       Ph: [phone] or [phone]       Fax: [phone]   RxID:   [id number]

## 2010-09-04 NOTE — Progress Notes (Signed)
Summary: Xanax Rf  Phone Note Refill Request   Refills Requested: Medication #1:  XANAX 0.5 MG  TABS two times a day as needed   Dosage confirmed as above?Dosage Confirmed   Supply Requested: 60   Last Refilled: 02/28/2010 [facility]   Method Requested: Telephone to Pharmacy Initial call taken by: Lanier Prude, [facility]),  April 30, 2010 5:08 PM  Follow-up for Phone Call        ok x 6 Follow-up by: Tresa Garter MD,  April 30, 2010 5:22 PM  Additional Follow-up for Phone Call Additional follow up Details #1::        Rx called to pharmacy Additional Follow-up by: Lanier Prude, [facility]),  May 01, 2010 8:11 AM    Prescriptions: Prudy Feeler 0.5 MG  TABS (ALPRAZOLAM) two times a day as needed  #60 x 6   Entered by:   Lanier Prude, [facility])   Authorized by:   Tresa Garter MD   Signed by:   Lanier Prude, CMA(AAMA) on 05/01/2010   Method used:   Telephoned to ...       [facility]* (retail)       [address] [place], [place]  [address]       Ph: [phone] or [phone]       Fax: [phone]   RxID:   [id number]

## 2010-09-04 NOTE — Progress Notes (Signed)
Summary: Metformin refill  Phone Note Refill Request Message from:  Fax from Pharmacy on September 01, 2009 9:30 AM  Refills Requested: Medication #1:  METFORMIN HCL 1000 MG TABS Take 1 tablet by mouth two times a day Initial call taken by: Lucious Groves,  September 01, 2009 9:31 AM  Follow-up for Phone Call        corrected prescription--fax was from [facility]. Follow-up by: Lucious Groves,  September 01, 2009 9:32 AM    Prescriptions: METFORMIN HCL 1000 MG TABS (METFORMIN HCL) Take 1 tablet by mouth two times a day  #180 x 3   Entered by:   Lucious Groves   Authorized by:   Tresa Garter MD   Signed by:   Lucious Groves on 09/01/2009   Method used:   Electronically to        [facility]* (retail)       [address] [place], [place]  [address]       Ph: [phone] or [phone]       Fax: [phone]   RxID:   [id number] METFORMIN HCL 1000 MG TABS (METFORMIN HCL) Take 1 tablet by mouth two times a day  #180 x 3   Entered by:   Lucious Groves   Authorized by:   Tresa Garter MD   Signed by:   Lucious Groves on 09/01/2009   Method used:   Electronically to        [id number] (retail)       [address]       [place], [place]  [address]       Ph: [phone] or [phone]       Fax: [phone]   RxID:   [id number]

## 2010-09-04 NOTE — Assessment & Plan Note (Signed)
Summary: 4 MO ROV / FLU SHOT /NWS   Vital Signs:  Patient profile:   70 year old female Height:      64 inches Weight:      140 pounds BMI:     24.12 O2 Sat:      95 % on Room air Temp:     97.7 degrees F oral Pulse rate:   92 / minute Pulse rhythm:   regular Resp:     16 per minute BP sitting:   100 / 68  (left arm) Cuff size:   regular  Vitals Entered By: Lanier Prude, CMA(AAMA) (May 14, 2010 1:35 PM)  O2 Flow:  Room air CC: 4 mo f/u Is Patient Diabetic? Yes   Primary Care Provider:  Dr Trinna Post Plotnikov  CC:  4 mo f/u.  History of Present Illness: The patient presents for a follow up of hypertension, diabetes, hyperlipidemia, COPD  Preventive Screening-Counseling & Management  Alcohol-Tobacco     Smoking Status: current     Packs/Day: 11/2pk pd  Current Medications (verified): 1)  Lovastatin 40 Mg  Tabs (Lovastatin) .... Take 1 By Mouth Qd 2)  Tenormin 50 Mg  Tabs (Atenolol) .... 1/2 Two Times A Day 3)  Zoloft 100 Mg  Tabs (Sertraline Hcl) .... Once Daily 4)  Vitamin-B Complex   Tabs (B Complex Vitamins) .... Once Daily 5)  Baby Aspirin 81 Mg  Chew (Aspirin) .... Once Daily 6)  Buspar 15 Mg  Tabs (Buspirone Hcl) .... Two Times A Day 7)  Xanax 0.5 Mg  Tabs (Alprazolam) .... Two Times A Day As Needed 8)  Prandin 2 Mg  Tabs (Repaglinide) .... Three Times A Day As Needed 9)  Omeprazole 20 Mg Cpdr (Omeprazole) .... Take 2  By Mouth  A Day 10)  Duoneb 0.5-2.5 (3) Mg/23ml Soln (Ipratropium-Albuterol) .Marland Kitchen.. 1 Via Hhn Qid Dx 491.2 Copd 11)  Metformin Hcl 1000 Mg Tabs (Metformin Hcl) .... Take 1 Tablet By Mouth Two Times A Day 12)  Wellbutrin Sr 150 Mg Xr12h-Tab (Bupropion Hcl) .Marland Kitchen.. 1 By Mouth Bid 13)  Pennsaid 1.5 % Soln (Diclofenac Sodium) .... 3-5 Gtt To Skin Three Times Daily As Needed 14)  Vitamin D3 1000 Unit  Tabs (Cholecalciferol) .Marland Kitchen.. 1 By Mouth Daily 15)  Combivent 103-18 Mcg/act Aero (Ipratropium-Albuterol) .... 2 Inh Qid Prn 16)  Onetouch Test  Strp (Glucose  Blood) .... Use Bid 17)  Prednisone 10 Mg Tabs (Prednisone) .... Take 40mg  Qd For 3 Days, Then 20 Mg Qd For 3 Days, Then 10mg  Qd For 6 Days, Then Stop. Take Pc.  Allergies (verified): 1)  ! 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   Review of Systems       The patient complains of dyspnea on exertion.  The patient denies chest pain and abdominal pain.  Physical Exam  General:  Well-developed,well-nourished,in no acute distress; alert,appropriate and cooperative throughout examination Nose:  External nasal examination shows no deformity or inflammation. Nasal mucosa are pink and moist without lesions or exudates. Mouth:  no lesions Lungs:  clear bilaterally, no wheezes, rhonchi or crackles Heart:  RRR Abdomen:  soft, non-tender, no organomegalies, no masses Msk:  No deformity or scoliosis noted of thoracic or lumbar spine.   Extremities:  trace left pedal edema and trace right pedal edema.   Neurologic:  No cranial nerve deficits noted. Station and gait are normal. Plantar reflexes are down-going bilaterally. DTRs are symmetrical throughout. Sensory, motor and coordinative functions appear intact. Skin:  Intact without suspicious lesions or rashes Psych:  Cognition and judgment  appear intact. Alert and cooperative with normal attention span and concentration. No apparent delusions, illusions, hallucinations; not suicidal and depressed affect.     Impression & Recommendations:  Problem # 1:  DIABETES MELLITUS, TYPE II (ICD-250.00) Assessment Unchanged  Her updated medication list for this problem includes:    Baby Aspirin 81 Mg Chew (Aspirin) ..... Once daily    Prandin 2 Mg Tabs (Repaglinide) .Marland Kitchen... Three times a day as needed    Metformin Hcl 1000 Mg Tabs (Metformin hcl) .Marland Kitchen... Take 1 tablet by mouth two times a day  Labs Reviewed: Creat: 0.8 (05/11/2010)    Reviewed HgBA1c results: 7.2 (05/11/2010)  7.1 (01/16/2010)  Problem # 2:  DYSLIPIDEMIA (ICD-272.4) Assessment: Unchanged  Her updated medication list for this problem includes:    Lovastatin 40 Mg Tabs (Lovastatin) .Marland Kitchen... Take 1 by mouth qd  Problem # 3:  GERD (ICD-530.81) Assessment: Deteriorated Seeing Dr Christella Hartigan Her updated medication list for this problem includes:    Omeprazole 20 Mg Cpdr (Omeprazole) .Marland Kitchen... Take 2  by mouth  a day  Problem # 4:  COPD (ICD-496) Assessment: Unchanged  PFTs obtained and discussed Her updated medication list for this problem includes:    Duoneb 0.5-2.5 (3) Mg/26ml Soln (Ipratropium-albuterol) .Marland Kitchen... 1 via hhn qid dx 491.2 copd    Combivent 103-18 Mcg/act Aero (Ipratropium-albuterol) .Marland Kitchen... 2 inh qid prn    Symbicort 160-4.5 Mcg/act Aero (Budesonide-formoterol fumarate) .Marland Kitchen... 2 inh two times a day  Orders: Spirometry w/Graph (94010)  Problem # 5:  TOBACCO USE DISORDER/SMOKER-SMOKING CESSATION DISCUSSED (ICD-305.1) Assessment: Deteriorated  Encouraged smoking cessation and discussed different methods for smoking cessation.   Complete Medication List: 1)  Lovastatin 40 Mg Tabs (Lovastatin) .... Take 1 by mouth qd 2)  Tenormin 50 Mg Tabs (Atenolol) .... 1/2 two times a day 3)  Zoloft 100 Mg Tabs (Sertraline hcl) .... Once daily 4)  Vitamin-b Complex Tabs (B  complex vitamins) .... Once daily 5)  Baby Aspirin 81 Mg Chew (Aspirin) .... Once daily 6)  Buspar 15 Mg Tabs (Buspirone hcl) .... Two times a day 7)  Xanax 0.5 Mg Tabs (Alprazolam) .... Two times a day as needed 8)  Prandin 2 Mg Tabs (Repaglinide) .... Three times a day as needed 9)  Omeprazole 20 Mg Cpdr (Omeprazole) .... Take 2  by mouth  a day 10)  Duoneb 0.5-2.5 (3) Mg/32ml Soln (Ipratropium-albuterol) .Marland Kitchen.. 1 via hhn qid dx 491.2 copd 11)  Metformin Hcl 1000 Mg Tabs (Metformin hcl) .... Take 1 tablet by mouth two times a day 12)  Wellbutrin Sr 150 Mg Xr12h-tab (Bupropion hcl) .Marland Kitchen.. 1 by mouth bid 13)  Pennsaid 1.5 % Soln (Diclofenac sodium) .... 3-5 gtt to skin three times daily as needed 14)  Vitamin D3 1000  Unit Tabs (Cholecalciferol) .Marland Kitchen.. 1 by mouth daily 15)  Combivent 103-18 Mcg/act Aero (Ipratropium-albuterol) .... 2 inh qid prn 16)  Onetouch Test Strp (Glucose blood) .... Use bid 17)  Symbicort 160-4.5 Mcg/act Aero (Budesonide-formoterol fumarate) .... 2 inh two times a day  Other Orders: Flu Vaccine 83yrs + MEDICARE PATIENTS ([id number]) Administration Flu vaccine - MCR ([id number])   Patient Instructions: 1)  Please schedule a follow-up appointment in 4 months well w/labs and A1c 250.00. Prescriptions: SYMBICORT 160-4.5 MCG/ACT AERO (BUDESONIDE-FORMOTEROL FUMARATE) 2 inh two times a day  #1 x 3   Entered and Authorized by:   Tresa Garter MD   Signed by:   Tresa Garter MD on 05/14/2010   Method used:   Print then Give to Patient   RxID:   [id number] OMEPRAZOLE 20 MG CPDR (OMEPRAZOLE) TAKE 2  by mouth  a day  #180 x 3   Entered and Authorized by:   Tresa Garter MD   Signed by:   Tresa Garter MD on 05/14/2010   Method used:   Electronically to        [facility]* (retail)       [address] [place], [place]  [address]       Ph: [phone] or [phone]       Fax: [phone]   RxID:    [id number]    .[email or web address]   Flu Vaccine Consent Questions     Do you have a history of severe allergic reactions to this vaccine? no    Any prior history of allergic reactions to egg and/or gelatin? no    Do you have a sensitivity to the preservative Thimersol? no    Do you have a past history of Guillan-Barre Syndrome? no    Do you currently have an acute febrile illness? no    Have you ever had a severe reaction to latex? no    Vaccine information given and explained to patient? yes    Are you currently pregnant? no    Lot Number:[id number]   Exp Date:02/02/2011   Site Given  Right Deltoid IM Lanier Prude, [facility])  May 14, 2010 1:40 PM

## 2010-09-04 NOTE — Assessment & Plan Note (Signed)
Summary: FU Natale Milch  #   Vital Signs:  Patient profile:   70 year old female Weight:      136 pounds BMI:     23.43 O2 Sat:      95 % Temp:     98.3 degrees F oral Pulse rate:   96 / minute BP sitting:   114 / 70  (left arm)  Vitals Entered By: Tora Perches (October 04, 2009 1:18 PM) CC: f/u Is Patient Diabetic? Yes   Primary Care Provider:  Dr Trinna Post Plotnikov  CC:  f/u.  History of Present Illness: The patient presents for a follow up of hypertension, diabetes, hyperlipidemia   Current Medications (verified): 1)  Lovastatin 40 Mg  Tabs (Lovastatin) .... Take 1 By Mouth Qd 2)  Tenormin 50 Mg  Tabs (Atenolol) .... 1/2 Two Times A Day 3)  Zoloft 100 Mg  Tabs (Sertraline Hcl) .... Once Daily 4)  Vitamin-B Complex   Tabs (B Complex Vitamins) .... Once Daily 5)  Baby Aspirin 81 Mg  Chew (Aspirin) .... Once Daily 6)  Buspar 15 Mg  Tabs (Buspirone Hcl) .... Two Times A Day 7)  Xanax 0.5 Mg  Tabs (Alprazolam) .... Two Times A Day As Needed 8)  Prandin 2 Mg  Tabs (Repaglinide) .... Three Times A Day As Needed 9)  Omeprazole 20 Mg Cpdr (Omeprazole) .... Take 2  By Mouth  A Day 10)  Duoneb 0.5-2.5 (3) Mg/22ml Soln (Ipratropium-Albuterol) .Marland Kitchen.. 1 Via Hhn Qid Dx 491.2 Copd 11)  Metformin Hcl 1000 Mg Tabs (Metformin Hcl) .... Take 1 Tablet By Mouth Two Times A Day 12)  Wellbutrin Sr 150 Mg Xr12h-Tab (Bupropion Hcl) .Marland Kitchen.. 1 By Mouth Bid 13)  Pennsaid 1.5 % Soln (Diclofenac Sodium) .... 3-5 Gtt To Skin Three Times Daily As Needed 14)  Vitamin D3 1000 Unit  Tabs (Cholecalciferol) .Marland Kitchen.. 1 By Mouth Daily 15)  Combivent 103-18 Mcg/act Aero (Ipratropium-Albuterol) .... 2 Inh Qid Prn 16)  Onetouch Test  Strp (Glucose Blood) .... Use Bid  Allergies: 1)  ! Chantix (Varenicline Tartrate)  Past History:  Past Medical History: Last updated: 06/23/2009 Breast cancer, hx of COPD GERD Diabetes mellitus, type II Colonic polyps, hx of L TM chronic perforation gyn Dr Jennette Kettle Osteoarthritis  Social  History: Last updated: 05/09/2008 Occupation: RT Married Patient has 2 daughters Current Smoker 1 1/2 ppd, quit again 2009 Alcohol Use - no Daily Caffeine Use-2 glasses daily Illicit Drug Use - no Patient does not get regular exercise.   Review of Systems  The patient denies weight loss, weight gain, chest pain, dyspnea on exertion, and abdominal pain.    Physical Exam  General:  Well-developed,well-nourished,in no acute distress; alert,appropriate and cooperative throughout examination Nose:  External nasal examination shows no deformity or inflammation. Nasal mucosa are pink and moist without lesions or exudates. Mouth:  no lesions Lungs:  clear bilaterally, no wheezes, rhonchi or crackles Heart:  RRR Abdomen:  soft, non-tender, no organomegalies, no masses Msk:  No deformity or scoliosis noted of thoracic or lumbar spine.   Neurologic:  No cranial nerve deficits noted. Station and gait are normal. Plantar reflexes are down-going bilaterally. DTRs are symmetrical throughout. Sensory, motor and coordinative functions appear intact. Skin:  Intact without suspicious lesions or rashes Psych:  Cognition and judgment appear intact. Alert and cooperative with normal attention span and concentration. No apparent delusions, illusions, hallucinations; not suicidal and depressed affect.     Impression & Recommendations:  Problem # 1:  DIABETES MELLITUS, TYPE II (ICD-250.00) Assessment Unchanged  Her updated medication list for this problem includes:    Baby Aspirin 81 Mg Chew (Aspirin) ..... Once daily    Prandin 2 Mg Tabs (Repaglinide) .Marland Kitchen... Three times a day as needed    Metformin Hcl 1000 Mg Tabs (Metformin hcl) .Marland Kitchen... Take 1 tablet by mouth two times a day  Problem # 2:  COPD (ICD-496) Assessment: Unchanged  Her updated medication list for this problem includes:    Duoneb 0.5-2.5 (3) Mg/48ml Soln (Ipratropium-albuterol) .Marland Kitchen... 1 via hhn qid dx 491.2 copd    Combivent 103-18  Mcg/act Aero (Ipratropium-albuterol) .Marland Kitchen... 2 inh qid prn  Problem # 3:  OSTEOARTHRITIS (ICD-715.90) Assessment: Unchanged  Her updated medication list for this problem includes:    Baby Aspirin 81 Mg Chew (Aspirin) ..... Once daily  Problem # 4:  DYSLIPIDEMIA (ICD-272.4) Assessment: Unchanged  Her updated medication list for this problem includes:    Lovastatin 40 Mg Tabs (Lovastatin) .Marland Kitchen... Take 1 by mouth qd  Problem # 5:  GERD (ICD-530.81) Assessment: Improved  Her updated medication list for this problem includes:    Omeprazole 20 Mg Cpdr (Omeprazole) .Marland Kitchen... Take 2  by mouth  a day  Complete Medication List: 1)  Lovastatin 40 Mg Tabs (Lovastatin) .... Take 1 by mouth qd 2)  Tenormin 50 Mg Tabs (Atenolol) .... 1/2 two times a day 3)  Zoloft 100 Mg Tabs (Sertraline hcl) .... Once daily 4)  Vitamin-b Complex Tabs (B complex vitamins) .... Once daily 5)  Baby Aspirin 81 Mg Chew (Aspirin) .... Once daily 6)  Buspar 15 Mg Tabs (Buspirone hcl) .... Two times a day 7)  Xanax 0.5 Mg Tabs (Alprazolam) .... Two times a day as needed 8)  Prandin 2 Mg Tabs (Repaglinide) .... Three times a day as needed 9)  Omeprazole 20 Mg Cpdr (Omeprazole) .... Take 2  by mouth  a day 10)  Duoneb 0.5-2.5 (3) Mg/79ml Soln (Ipratropium-albuterol) .Marland Kitchen.. 1 via hhn qid dx 491.2 copd 11)  Metformin Hcl 1000 Mg Tabs (Metformin hcl) .... Take 1 tablet by mouth two times a day 12)  Wellbutrin Sr 150 Mg Xr12h-tab (Bupropion hcl) .Marland Kitchen.. 1 by mouth bid 13)  Pennsaid 1.5 % Soln (Diclofenac sodium) .... 3-5 gtt to skin three times daily as needed 14)  Vitamin D3 1000 Unit Tabs (Cholecalciferol) .Marland Kitchen.. 1 by mouth daily 15)  Combivent 103-18 Mcg/act Aero (Ipratropium-albuterol) .... 2 inh qid prn 16)  Onetouch Test Strp (Glucose blood) .... Use bid 17)  Prednisone 10 Mg Tabs (Prednisone) .... Take 40mg  qd for 3 days, then 20 mg qd for 3 days, then 10mg  qd for 6 days, then stop. take pc. 18)  Zithromax Z-pak 250 Mg Tabs  (Azithromycin) .... As dirrected  Patient Instructions: 1)  Please schedule a follow-up appointment in 4 months. 2)  BMP prior to visit, ICD-9: 3)  Hepatic Panel prior to visit, ICD-9:250.00 4)  Lipid Panel prior to visit, ICD-9: 5)  HbgA1C prior to visit, ICD-9: Prescriptions: ZITHROMAX Z-PAK 250 MG TABS (AZITHROMYCIN) as dirrected  #1 x 0   Entered and Authorized by:   Tresa Garter MD   Signed by:   Tresa Garter MD on 10/04/2009   Method used:   Print then Give to Patient   RxID:   [id number] PREDNISONE 10 MG TABS (PREDNISONE) Take 40mg  qd for 3 days, then 20 mg qd for 3 days, then 10mg  qd for 6 days, then stop. Take pc.  #  24 x 1   Entered and Authorized by:   Tresa Garter MD   Signed by:   Tresa Garter MD on 10/04/2009   Method used:   Print then Give to Patient   RxID:   [id number] BUSPAR 15 MG  TABS (BUSPIRONE HCL) two times a day  #180 x 3   Entered and Authorized by:   Tresa Garter MD   Signed by:   Tresa Garter MD on 10/04/2009   Method used:   Print then Give to Patient   RxID:   [id number] WELLBUTRIN SR 150 MG XR12H-TAB (BUPROPION HCL) 1 by mouth bid  #180 x 3   Entered and Authorized by:   Tresa Garter MD   Signed by:   Tresa Garter MD on 10/04/2009   Method used:   Print then Give to Patient   RxID:   [id number] XANAX 0.5 MG  TABS (ALPRAZOLAM) two times a day as needed  #60 x 6   Entered and Authorized by:   Tresa Garter MD   Signed by:   Tresa Garter MD on 10/04/2009   Method used:   Print then Give to Patient   RxID:   [id number]

## 2010-09-04 NOTE — Progress Notes (Signed)
Summary: Alprazolam refill  Phone Note Refill Request Message from:  Fax from Pharmacy on September 21, 2009 11:12 AM  Refills Requested: Medication #1:  XANAX 0.5 MG  TABS two times a day as needed Next Appointment Scheduled: 10-04-2009 Initial call taken by: Lucious Groves,  September 21, 2009 11:12 AM  Follow-up for Phone Call        ok x 6 Follow-up by: Tresa Garter MD,  September 21, 2009 1:25 PM    Prescriptions: Prudy Feeler 0.5 MG  TABS (ALPRAZOLAM) two times a day as needed  #60 x 6   Entered by:   Rock Nephew CMA   Authorized by:   Tresa Garter MD   Signed by:   Rock Nephew CMA on 09/21/2009   Method used:   Telephoned to ...       [id number] (retail)       [address]       [place], [place]  [address]       Ph: [phone] or [phone]       Fax: [phone]   RxID:   [id number]

## 2010-09-04 NOTE — Progress Notes (Signed)
Summary: omeprazole, buspirone,sertraline,atenolol,lovastatin  Phone Note Other Incoming Call back at fax   [facility]: [facility] Summary of Call: refill on omeprazole, atenolol, buspirone, sertraline, lovastatin  ok x 3 refills on both/vg Initial call taken by: Tora Perches,  August 31, 2009 1:36 PM    Prescriptions: OMEPRAZOLE 20 MG CPDR (OMEPRAZOLE) TAKE 1 by mouth  a day  #90 x 3   Entered by:   Tora Perches   Authorized by:   Tresa Garter MD   Signed by:   Tora Perches on 08/31/2009   Method used:   Faxed to ...       [facility]* (retail)       [address] [place], [place]  [address]       Ph: [phone] or [phone]       Fax: [phone]   RxID:   [id number] BUSPAR 15 MG  TABS (BUSPIRONE HCL) two times a day  #180 x 2   Entered by:   Tora Perches   Authorized by:   Tresa Garter MD   Signed by:   Tora Perches on 08/31/2009   Method used:   Faxed to ...       [facility]* (retail)       [address] [place], [place]  [address]       Ph: [phone] or [phone]       Fax: [phone]   RxID:   [id number] ZOLOFT 100 MG  TABS (SERTRALINE HCL) once daily  #90 x 2   Entered by:   Tora Perches   Authorized by:   Tresa Garter MD   Signed by:   Tora Perches on 08/31/2009   Method used:   Faxed to ...       [facility]* (retail)       [address] [place], [place]  [address]       Ph: [phone] or [phone]       Fax: [phone]   RxID:   [id number] TENORMIN 50 MG  TABS (ATENOLOL) 1/2 two times a day  #90 x 3   Entered by:   Tora Perches   Authorized by:   Tresa Garter MD   Signed by:   Tora Perches on 08/31/2009   Method used:   Faxed to ...       [facility]* (retail)       [address] [place], [place]  [address]       Ph: [phone] or [phone]       Fax: [phone]   RxID:   [id number] LOVASTATIN 40 MG  TABS (LOVASTATIN) TAKE 1 by mouth QD  #90 x 3   Entered by:   Tora Perches   Authorized by:   Tresa Garter MD   Signed by:   Tora Perches on 08/31/2009   Method used:   Faxed to ...       [facility]* (retail)       [address] [place], [place]  [address]       Ph: [phone] or [phone]       Fax: [phone]   RxID:   [id number]

## 2010-09-05 ENCOUNTER — Encounter (INDEPENDENT_AMBULATORY_CARE_PROVIDER_SITE_OTHER): Payer: Medicare Other

## 2010-09-05 ENCOUNTER — Encounter: Payer: Self-pay | Admitting: Internal Medicine

## 2010-09-05 DIAGNOSIS — I6529 Occlusion and stenosis of unspecified carotid artery: Secondary | ICD-10-CM

## 2010-09-05 DIAGNOSIS — R0989 Other specified symptoms and signs involving the circulatory and respiratory systems: Secondary | ICD-10-CM

## 2010-09-06 NOTE — Assessment & Plan Note (Signed)
Summary: yearly f/u medicare/#/cd   Vital Signs:  Patient profile:   70 year old female Height:      64 inches Weight:      144 pounds BMI:     24.81 O2 Sat:      93 % on Room air Temp:     98.7 degrees F oral Pulse rate:   88 / minute Pulse rhythm:   regular Resp:     16 per minute BP sitting:   124 / 70  (left arm) Cuff size:   regular  Vitals Entered By: Lanier Prude, CMA(AAMA) (August 28, 2010 2:19 PM)  O2 Flow:  Room air CC: MWV Is Patient Diabetic? No   Primary Care Provider:  Dr Trinna Post Georgene Kopper  CC:  [facility].  History of Present Illness: The patient presents for a follow up of hypertension, diabetes, hyperlipidemia The patient presents for a preventive health examination  Patient past medical history, social history, and family history reviewed in detail no significant changes.  Patient is physically active. Depression is negative and mood is fair. Hearing is normal, and able to perform activities of daily living. Risk of falling is negligible and home safety has been reviewed and is appropriate. Patient has normal heighnl t, weight, and good visual acuity. Patient has been counseled on age-appropriate routine health concerns for screening and prevention. Education, counseling done. Cognition is nl.   Preventive Screening-Counseling & Management  Alcohol-Tobacco     Alcohol drinks/day: 0     Smoking Status: current     Packs/Day: <0.25  Caffeine-Diet-Exercise     Caffeine use/day: 3     Does Patient Exercise: yes     Type of exercise: cardio,weights     Times/week: 3     Depression Counseling: not indicated; screening negative for depression  Hep-HIV-STD-Contraception     Hepatitis Risk: no risk noted     Dental Visit-last 6 months no     SBE monthly: yes     Sun Exposure-Excessive: no  Safety-Violence-Falls     Seat Belt Use: yes     Helmet Use: n/a     Firearms in the Home: no firearms in the home     Smoke Detectors: yes     Violence in the Home: no  risk noted     Sexual Abuse: no     Fall Risk: none  Current Medications (verified): 1)  Lovastatin 40 Mg  Tabs (Lovastatin) .... Take 1 By Mouth Qd 2)  Tenormin 50 Mg  Tabs (Atenolol) .... 1/2 Two Times A Day 3)  Zoloft 100 Mg  Tabs (Sertraline Hcl) .... Once Daily 4)  Vitamin-B Complex   Tabs (B Complex Vitamins) .... Once Daily 5)  Baby Aspirin 81 Mg  Chew (Aspirin) .... Once Daily 6)  Buspar 15 Mg  Tabs (Buspirone Hcl) .... Two Times A Day 7)  Xanax 0.5 Mg  Tabs (Alprazolam) .... Two Times A Day As Needed 8)  Prandin 2 Mg  Tabs (Repaglinide) .... Three Times A Day As Needed 9)  Omeprazole 20 Mg Cpdr (Omeprazole) .... Take 2  By Mouth  A Day 10)  Duoneb 0.5-2.5 (3) Mg/6ml Soln (Ipratropium-Albuterol) .Marland Kitchen.. 1 Via Hhn Qid Dx 491.2 Copd 11)  Metformin Hcl 1000 Mg Tabs (Metformin Hcl) .... Take 1 Tablet By Mouth Two Times A Day 12)  Wellbutrin Sr 150 Mg Xr12h-Tab (Bupropion Hcl) .Marland Kitchen.. 1 By Mouth Bid 13)  Pennsaid 1.5 % Soln (Diclofenac Sodium) .... 3-5 Gtt To Skin Three Times Daily  As Needed 14)  Vitamin D3 1000 Unit  Tabs (Cholecalciferol) .Marland Kitchen.. 1 By Mouth Daily 15)  Combivent 103-18 Mcg/act Aero (Ipratropium-Albuterol) .... 2 Inh Qid Prn 16)  Onetouch Test  Strp (Glucose Blood) .... Use Bid 17)  Symbicort 160-4.5 Mcg/act Aero (Budesonide-Formoterol Fumarate) .... 2 Inh Two Times A Day  Allergies (verified): 1)  ! Chantix (Varenicline Tartrate)  Past History:  Past Medical History: Last updated: 06/23/2009 Breast cancer, hx of COPD GERD Diabetes mellitus, type II Colonic polyps, hx of L TM chronic perforation gyn Dr Jennette Kettle Osteoarthritis  Past Surgical History: Last updated: 01/20/2008 C-Section x 2 (1980, 1982) laparoscopy (work up for infertility) Left tympanoplasty x 2 Left breast fibroadenoma removal-1960's Breast Cyst aspirations Left modified radial mastectomy  Social History: Last updated: 05/14/2010 Occupation: RT Married Patient has 2 daughters Current Smoker  1 1/2 ppd, quit again 2009; restarted 1 ppd 2011 Alcohol Use - no Daily Caffeine Use-2 glasses daily Illicit Drug Use - no Patient does not get regular exercise.   Social History: Packs/Day:  <0.25 Caffeine use/day:  3 Does Patient Exercise:  yes Dental Care w/in 6 mos.:  no Sun Exposure-Excessive:  no Seat Belt Use:  yes Fall Risk:  none Hepatitis Risk:  no risk noted  Review of Systems       The patient complains of dyspnea on exertion and prolonged cough.  The patient denies anorexia, fever, weight loss, weight gain, vision loss, decreased hearing, hoarseness, chest pain, syncope, peripheral edema, headaches, hemoptysis, abdominal pain, melena, hematochezia, severe indigestion/heartburn, hematuria, incontinence, genital sores, muscle weakness, suspicious skin lesions, transient blindness, difficulty walking, depression, unusual weight change, abnormal bleeding, enlarged lymph nodes, angioedema, and breast masses.    Physical Exam  General:  Well-developed,well-nourished,in no acute distress; alert,appropriate and cooperative throughout examination Head:  Normocephalic and atraumatic without obvious abnormalities. No apparent alopecia or balding. Eyes:  No corneal or conjunctival inflammation noted. EOMI. Perrla. Ears:  External ear exam shows no significant lesions or deformities.  Otoscopic examination reveals clear canals, tympanic membranes are intact bilaterally without bulging, retraction, inflammation or discharge. Hearing is grossly normal bilaterally. Nose:  External nasal examination shows no deformity or inflammation. Nasal mucosa are pink and moist without lesions or exudates. Mouth:  no lesions Neck:  supple, no lymphodenomathies, no masses, thyromegaly  Bruit is present - mild Lungs:  clear bilaterally, no wheezes, rhonchi or crackles Heart:  RRR Abdomen:  soft, non-tender, no organomegalies, no masses Msk:  No deformity or scoliosis noted of thoracic or lumbar spine.    Pulses:  R and L carotid,radial,femoral,dorsalis pedis and posterior tibial pulses are full and equal bilaterally Extremities:  No clubbing, cyanosis, edema, or deformity noted with normal full range of motion of all joints.   Neurologic:  No cranial nerve deficits noted. Station and gait are normal. Plantar reflexes are down-going bilaterally. DTRs are symmetrical throughout. Sensory, motor and coordinative functions appear intact. Skin:  Intact without suspicious lesions or rashes Axillary Nodes:  No palpable lymphadenopathy Psych:  Cognition and judgment appear intact. Alert and cooperative with normal attention span and concentration. No apparent delusions, illusions, hallucinations; not suicidal and depressed affect.     Impression & Recommendations:  Problem # 1:  HEALTH MAINTENANCE EXAM (ICD-V70.0) Assessment New  Overall doing well, age appropriate education and counseling updated and referral for appropriate preventive services done unless declined, immunizations up to date or declined, diet counseling done if overweight, urged to quit smoking if smokes, most recent labs reviewed and current ordered  if appropriate, ecg reviewed or declined (interpretation per ECG scanned in the EMR if done); information regarding Medicare Preventation requirements given if appropriate.  I have personally reviewed the Medicare Annual Wellness questionnaire and have noted 1.   The patient's medical and social history 2.   Their use of alcohol, tobacco or illicit drugs 3.   Their current medications and supplements 4.   The patient's functional ability including ADL's, fall risks, home safety risks and hearing or visual             impairment. 5.   Diet and physical activities 6.   Evidence for depression or mood disorders The patients weight, height, BMI and visual acuity have been recorded in the chart I have made referrals, counseling and provided education to the patient based review of the above  and I have provided the pt with a written personalized care plan for preventive services.    Orders: Medicare -1st Annual Wellness Visit [phone])  Problem # 2:  TOBACCO USE DISORDER/SMOKER-SMOKING CESSATION DISCUSSED (ICD-305.1) Assessment: Improved  Encouraged smoking cessation and discussed different methods for smoking cessation.   Problem # 3:  DYSLIPIDEMIA (ICD-272.4)  Her updated medication list for this problem includes:    Lovastatin 40 Mg Tabs (Lovastatin) .Marland Kitchen... Take 1 by mouth qd  Problem # 4:  COPD (ICD-496) Assessment: Deteriorated As per #2 Her updated medication list for this problem includes:    Duoneb 0.5-2.5 (3) Mg/3ml Soln (Ipratropium-albuterol) .Marland Kitchen... 1 via hhn qid dx 491.2 copd    Combivent 103-18 Mcg/act Aero (Ipratropium-albuterol) .Marland Kitchen... 2 inh qid prn    Symbicort 160-4.5 Mcg/act Aero (Budesonide-formoterol fumarate) .Marland Kitchen... 2 inh two times a day    Daliresp 500 Mcg Tabs (Roflumilast) .Marland Kitchen... 1 by mouth qd  Problem # 5:  CAROTID BRUIT (ICD-785.9) Assessment: New  Orders: Doppler Referral (Doppler)  Complete Medication List: 1)  Lovastatin 40 Mg Tabs (Lovastatin) .... Take 1 by mouth qd 2)  Tenormin 50 Mg Tabs (Atenolol) .... 1/2 two times a day 3)  Zoloft 100 Mg Tabs (Sertraline hcl) .... Once daily 4)  Vitamin-b Complex Tabs (B complex vitamins) .... Once daily 5)  Baby Aspirin 81 Mg Chew (Aspirin) .... Once daily 6)  Buspar 15 Mg Tabs (Buspirone hcl) .... Two times a day 7)  Xanax 0.5 Mg Tabs (Alprazolam) .... Two times a day as needed 8)  Prandin 2 Mg Tabs (Repaglinide) .... Three times a day as needed 9)  Omeprazole 20 Mg Cpdr (Omeprazole) .... Take 2  by mouth  a day 10)  Duoneb 0.5-2.5 (3) Mg/1ml Soln (Ipratropium-albuterol) .Marland Kitchen.. 1 via hhn qid dx 491.2 copd 11)  Metformin Hcl 1000 Mg Tabs (Metformin hcl) .... Take 1 tablet by mouth two times a day 12)  Wellbutrin Sr 150 Mg Xr12h-tab (Bupropion hcl) .Marland Kitchen.. 1 by mouth bid 13)  Pennsaid 1.5 % Soln  (Diclofenac sodium) .... 3-5 gtt to skin three times daily as needed 14)  Vitamin D3 1000 Unit Tabs (Cholecalciferol) .Marland Kitchen.. 1 by mouth daily 15)  Combivent 103-18 Mcg/act Aero (Ipratropium-albuterol) .... 2 inh qid prn 16)  Onetouch Test Strp (Glucose blood) .... Use bid 17)  Symbicort 160-4.5 Mcg/act Aero (Budesonide-formoterol fumarate) .... 2 inh two times a day 18)  Prednisone 10 Mg Tabs (Prednisone) .... Take 40mg  qd for 3 days, then 20 mg qd for 3 days, then 10mg  qd for 6 days, then stop. take pc. 19)  Zithromax Z-pak 250 Mg Tabs (Azithromycin) .... As dirrected 20)  Daliresp 500  Mcg Tabs (Roflumilast) .Marland Kitchen.. 1 by mouth qd  Other Orders: TD Toxoids IM 7 YR + (08657) Admin 1st Vaccine (84696)  Patient Instructions: 1)  LABS IN AM v70.0 tomorrow 2)  BMP prior to visit, ICD-9:401.1 3)  Hepatic Panel prior to visit, ICD-9: 4)  Lipid Panel prior to visit, ICD-9: 5)  TSH prior to visit, ICD-9: 6)  CBC w/ Diff prior to visit, ICD-9: 7)  Urine-dip prior to visit, ICD-9:250.00 8)  HbgA1C prior to visit, ICD-9: 9)  Urine Microalbumin prior to visit, ICD-9: 10)  Please schedule a follow-up appointment in 4 months. 11)  Hgb A1c 12)  BMET Prescriptions: DALIRESP 500 MCG TABS (ROFLUMILAST) 1 by mouth qd  #90 x 3   Entered and Authorized by:   Tresa Garter MD   Signed by:   Tresa Garter MD on 08/28/2010   Method used:   Print then Give to Patient   RxID:   [id number] ZITHROMAX Z-PAK 250 MG TABS (AZITHROMYCIN) as dirrected  #1 x 0   Entered and Authorized by:   Tresa Garter MD   Signed by:   Tresa Garter MD on 08/28/2010   Method used:   Print then Give to Patient   RxID:   [id number] PREDNISONE 10 MG TABS (PREDNISONE) Take 40mg  qd for 3 days, then 20 mg qd for 3 days, then 10mg  qd for 6 days, then stop. Take pc.  #24 x 1   Entered and Authorized by:   Tresa Garter MD   Signed by:   Tresa Garter MD on 08/28/2010   Method used:   Print  then Give to Patient   RxID:   [id number] SYMBICORT 160-4.5 MCG/ACT AERO (BUDESONIDE-FORMOTEROL FUMARATE) 2 inh two times a day  #3 x 3   Entered and Authorized by:   Tresa Garter MD   Signed by:   Tresa Garter MD on 08/28/2010   Method used:   Electronically to        [facility]* (retail)       [address] [place], [place]  [address]       Ph: [phone] or [phone]       Fax: [phone]   RxID:   [id number] ZOLOFT 100 MG  TABS (SERTRALINE HCL) once daily  #90 x 2   Entered and Authorized by:   Tresa Garter MD   Signed by:   Tresa Garter MD on 08/28/2010   Method used:   Electronically to        [facility]* (retail)       [address] [place], [place]  [address]       Ph: [phone] or [phone]       Fax: [phone]   RxID:   [id number] TENORMIN 50 MG  TABS (ATENOLOL) 1/2 two times a day  #90 x 3   Entered and Authorized by:   Tresa Garter MD   Signed by:   Tresa Garter MD on 08/28/2010   Method used:   Electronically to        [facility]* (retail)       [address]       [place]  [address] [place], [place]  [address]       Ph: [phone] or [phone]       Fax: [phone]   RxID:   [id number] LOVASTATIN 40 MG  TABS (LOVASTATIN) TAKE 1 by mouth QD  #90 x 3   Entered and Authorized by:   Tresa Garter MD   Signed by:   Tresa Garter MD on 08/28/2010   Method used:   Electronically to        [facility]* (retail)       [address] [place], [place]  [address]       Ph: [phone] or [phone]       Fax: [phone]   RxID:   [id number]    Orders Added: 1)  TD Toxoids IM 7 YR + [90714] 2)  Admin 1st Vaccine [90471] 3)  Doppler Referral  [Doppler] 4)  Medicare -1st Annual Wellness Visit [G0438] 5)  Est. Patient Level III [57322]   Immunization History:  Pneumovax Immunization History:    Pneumovax:  historical (05/11/2008)  Immunizations Administered:  Tetanus Vaccine:    Vaccine Type: Td    Site: right deltoid    Mfr: Sanofi Pasteur    Dose: 0.5 ml    Route: IM    Given by: Lanier Prude, CMA(AAMA)    Exp. Date: 11/22/2011    Lot #: [id number]    VIS given: 06/22/08 version given August 28, 2010.   Immunization History:  Pneumovax Immunization History:    Pneumovax:  Historical (05/11/2008)  Immunizations Administered:  Tetanus Vaccine:    Vaccine Type: Td    Site: right deltoid    Mfr: Sanofi Pasteur    Dose: 0.5 ml    Route: IM    Given by: Lanier Prude, CMA(AAMA)    Exp. Date: 11/22/2011    Lot #: [id number]    VIS given: 06/22/08 version given August 28, 2010.

## 2010-09-06 NOTE — Letter (Addendum)
Summary: Primary Care Consult Scheduled Letter  [facility]  [address] [place], [place] [address]   Phone: [phone]  Fax: [phone]      08/28/2010 MRN: [id number]  [name] [name] [address] [place], [place]  [address]    Dear Ms. Tullius,      We have scheduled an appointment for you.  At the recommendation of Dr.Plotnikov, we have scheduled you a consult with [facility] (Doppler) on FEB 1,2012 at 1:00pm.  Their address is [address] .The office phone number is [phone]  If this appointment day and time is not convenient for you, please feel free to call the office of the doctor you are being referred to at the number listed above and reschedule the appointment.     It is important for you to keep your scheduled appointments. We are here to make sure you are given good patient care. If you have questions or you have made changes to your appointment, please notify us at  [phone]        , ask for Debra.   Thank you,  Patient Care Coordinator  [facility]

## 2010-09-12 NOTE — Letter (Signed)
Summary: [facility]  [facility]   Imported By: Lester Watkins 09/07/2010 11:24:12  _____________________________________________________________________  External Attachment:    Type:   Image     Comment:   External Document

## 2010-09-12 NOTE — Miscellaneous (Signed)
Summary: Orders Update  Clinical Lists Changes  Orders: Added new Test order of Carotid Duplex (Carotid Duplex) - Signed 

## 2010-11-13 ENCOUNTER — Telehealth: Payer: Self-pay | Admitting: *Deleted

## 2010-11-13 MED ORDER — BUPROPION HCL ER (SR) 150 MG PO TB12: 150.0000 mg | ORAL_TABLET | Freq: Two times a day (BID) | ORAL | Status: DC

## 2010-11-13 NOTE — Telephone Encounter (Signed)
rf sent 

## 2010-11-28 ENCOUNTER — Telehealth: Payer: Self-pay | Admitting: *Deleted

## 2010-11-28 ENCOUNTER — Other Ambulatory Visit: Payer: Self-pay | Admitting: *Deleted

## 2010-11-28 MED ORDER — BUSPIRONE HCL 15 MG PO TABS: 15.0000 mg | ORAL_TABLET | Freq: Two times a day (BID) | ORAL | Status: DC

## 2010-11-28 NOTE — Telephone Encounter (Signed)
rec rf req for Alprazolam 0.5mg  1 po bid # 60. Last filled 09-28-10.... Ok to Rf??

## 2010-11-28 NOTE — Telephone Encounter (Signed)
OK to fill this prescription with additional refills x2. How is her husband doing? Thank you!

## 2010-11-29 MED ORDER — ALPRAZOLAM 0.5 MG PO TABS: 0.5000 mg | ORAL_TABLET | Freq: Two times a day (BID) | ORAL | Status: DC

## 2010-11-30 ENCOUNTER — Telehealth: Payer: Self-pay | Admitting: *Deleted

## 2010-11-30 NOTE — Telephone Encounter (Signed)
FYI:  I spoke to pt...she states her husband [name] had CABG just over 1 week ago and is home now and doing fine.

## 2010-12-18 NOTE — Consult Note (Signed)
[name] [name], MAZER NO.:  [id number]   MEDICAL RECORD NO.:  [id number]          PATIENT TYPE:  INP   LOCATION:  2010                         FACILITY:  [facility]   PHYSICIAN:  Madolyn Frieze. Jens Som, MD, FACCDATE OF BIRTH:  December 02, 1940   DATE OF CONSULTATION:  04/25/2008  DATE OF DISCHARGE:                                 CONSULTATION   [name] is a pleasant 70 year old female who we were asked to evaluate  for chest tightness and an abnormal electrocardiogram.  The patient has  no prior cardiac history.  She did have a stress Myoview in March 2008  that showed no scar or ischemia and her ejection fraction was 72%.  She  did develop hypoxia with ambulation.  She typically denies any dyspnea  on exertion, orthopnea, PND, pedal edema, palpitations, presyncope,  syncope, or exertional chest pain.  She traveled to [place] recently.  While she was there approximately 1 week ago, she developed a sneeze.  She then developed congestion followed by a cough productive of a clear  sputum.  There she was noted to have fevers and chills.  There was no  hemoptysis.  She then developed significant dyspnea on exertion, which  slowly worsened.  It got to the point where she was short of breath  continuously.  She traveled home on the train on Saturday this past  week.  It reached an altitude, she developed chest tightness.  The pain  was described as a constant pressure.  It did not radiate.  It was not  related to position or pleuritic. It lasted approximately 1 hour and  then resolved spontaneously.  There was no associated nausea, vomiting,  or diaphoresis.  However, she did continue to be short of breath.  The  pain did not radiate.  It has now resolved spontaneously and has had no  further symptoms.  Prior to arrival to the ER,  she was given a dose of  prednisone by a family friend who is a Development worker, community .  She saw her family  physician and ECG was felt to show possible prior anterior  infarct. She  was therefore admitted for rule out myocardial infarction and we were  asked to evaluate.   MEDICATIONS:  At home, her medications  include __________  p.o. daily.  Foradil Aerolizer, fish oil, aspirin 81 mg daily, Glucophage 500 mg in  the morning and 1000 in the evening, buspirone, Xanax, Prandin ,  omeprazole.   ALLERGIES:  ZANTAC.   PAST MEDICAL HISTORY:  Significant for diabetes mellitus as well as  hyperlipidemia, but has no hypertension.  She also has COPD.  She has a  history of gastroesophageal reflux disease.  She has previous breast  cancer status post left mastectomy.  There is no significant past  medical history noted.   SOCIAL HISTORY:  Patient does smoke.  She does not consume alcohol.  She  has 2 children, none is married.   FAMILY HISTORY:  No CAD.   REVIEW OF SYSTEMS:  She denies any headaches, fevers, or chills.  She  denies hemoptysis.  There is  no dysphagia, odynophagia, melena, or  hematochezia.  There is no dysuria or hematuria.  There are no rashes or  seizure activity.  There is orthopnea, but there is no PND or pedal  edema.  Remaining systems are negative.   PHYSICAL EXAMINATION:  VITAL SIGNS:  Blood pressure of 136/75, pulse is  80.  Her temperature is 97.3.  She is 95% on room air.  GENERAL:  She is well developed and well nourished, in no acute  distress.  SKIN:  Warm and dry.  She does not appear to be depressed.  There is no  peripheral clubbing.  BACK:  Normal.  HEENT:  Normal with normal eyelids.  NECK:  Supple with a normal upstroke bilaterally.  I cannot appreciate  bruits.  There is no jugular venous distention, and no thyromegaly is  noted.  CHEST:  Diminished breath sounds throughout.  There is a minimal  expiratory wheeze.  CARDIOVASCULAR:  Regular rate and rhythm with a normal S1 and S2.  Her  heart sounds are distant.  I cannot appreciate murmurs, rubs, or  gallops.  ABDOMEN:  Nontender, nondistended.  Positive bowel  sounds.  No  hepatosplenomegaly.  No mass appreciated.  There is no abdominal bruit.  EXTREMITIES:  She has 2+ femoral pulses bilaterally.  No bruits.  Extremities showed no edema that I can palpate.  No cords.  She has 2+  dorsalis pedis pulses bilaterally.  NEUROLOGIC:  Grossly intact.   LABORATORY DATA:  Her laboratories show a sodium of 138 and potassium of  3.9.  Her BUN and creatinine are 15 and 0.73 respectively.  Her D-dimer  is normal.  Her hemoglobin and hematocrit were 13.9 and 41.8.  Her white  blood cell count is 12.1.   Her electrocardiogram here shows a sinus rhythm at a rate of 75.  There  were no ST changes noted.   DIAGNOSES:  1. Atypical chest pain - The patient was admitted with chest pain.  A      D-dimer has been done that was normal, which essentially excludes      pulmonary embolus.  An electrocardiogram was normal.  Her symptoms      are somewhat atypical but we will cycle enzymes.  If they are      negative, will require an outpatient Myoview for risk      stratification.  2. Abnormal electrocardiogram.  There may have been lead placement      issues responsible for loss of R wave  from her primary care      physician's office. Her electrocardiogram is normal here.  3. Dyspnea - Most likely related to chronic obstructive pulmonary      disease.  She will need therapy for this and I will leave this to      the primary care service.  We will check an echocardiogram to      evaluate LV function. Again, we will also cycle the enzymes.  4. Diabetes mellitus - Per the primary care service.  5. Hyperlipidemia - Continue her statin.  6. Tobacco abuse - We advised the patient to discontinue this.   We will be happy to follow while she is in the hospital.      Madolyn Frieze. Jens Som, MD, [facility]  Electronically Signed     BSC/MEDQ  D:  04/25/2008  T:  04/27/2008  Job:  [id number]

## 2010-12-18 NOTE — Discharge Summary (Signed)
NAMEHADLEA, FURUYA NO.:  [id number]   MEDICAL RECORD NO.:  [id number]          PATIENT TYPE:  INP   LOCATION:  2010                         FACILITY:  [facility]   PHYSICIAN:  Valerie A. Felicity Coyer, MDDATE OF BIRTH:  13-Apr-1941   DATE OF ADMISSION:  04/25/2008  DATE OF DISCHARGE:  04/29/2008                               DISCHARGE SUMMARY   DISCHARGE DIAGNOSES:  1. Atypical chest pain secondary to chronic obstructive pulmonary      disease exacerbation.  2. Dyslipidemia.  3. Depression/anxiety.  4. Diabetes type 2 exacerbated by steroids during this admission.  5. Gastroesophageal reflux disease.  6. History of breast cancer.  7. Tobacco abuse.   HISTORY OF PRESENT ILLNESS:  [name] is a 70 year old female, who was  admitted on April 25, 2008, with chief complaint of shortness of  breath.  She apparently was in  [place] about 1 week prior to this  admission where she developed shortness of breath and fatigue.  She  denied any chest pain, peripheral edema, fever, or chills.  She also  noted a nonproductive cough and did not have any hemoptysis.  The  patient's family friend, who is a physician put her on 60 mg of  prednisone the day prior to this admission without improvement.  She  presented for further evaluation and treatment to the [facility] and was  admitted to [facility].   COURSE OF HOSPITALIZATION:  1. Atypical chest pain secondary to acute COPD exacerbation.  The      patient was admitted.  She was seen in consultation by [facility]      [facility], who felt that the patient's symptoms were atypical in      nature and likely secondary to COPD flare.  She was placed on IV      Solu-Medrol as well as Xopenex and ipratropium.  She was also      placed on empiric antibiotics.  The patient improved clinically.      Her hyperglycemia was managed with sliding scale insulin.  Her      blood sugars are currently stable under 150 on oral prednisone.  Her oxygen saturation is stable.  Of note, she did undergo a 2-D      echo during this admission which noted a normal LV function of 65%      and positive diastolic dysfunction.  There was noted to be some      hypokinesis of the basal to mid posterior wall.  At this time, we      planned to discharge home on a prednisone taper.  We will give her      prescriptions for Xopenex and ipratropium bromide, which is      instructed to use for the next week as well as a nebulizer machine      with tubing and mask.  She may then returned to her Combivent      inhaler as needed.   DISCHARGE MEDICATIONS:  1. Lovastatin 40 mg p.o. daily.  2. Atenolol 25 mg p.o. b.i.d.  3. Zoloft 100 mg p.o. daily.  4.  Caltrate plus D 600 mg p.o. daily.  5. Foradil 12 mcg twice daily.  6. Vitamin B complex once daily.  7. Aspirin 81 mg p.o. daily.  8. Glucophage 1000 mg p.o. b.i.d.  9. BuSpar 15 mg p.o. b.i.d.  10.Xanax 0.5 mg p.r.n.  11.Prandin 2 mg p.o. t.i.d.  12.Robinul Forte  2 mg one-half tablet p.o. daily.  13.Omeprazole 50 mg p.o. b.i.d.  14.Xopenex 0.63 mg nebs and nebulizer three times daily as needed.  15.Ipratropium bromide 0.5 mg per 2.5 mL.  16.Nebs three times daily.  These are to be used for the next week and      then the patient may resume Combivent 2 puffs q.6 h. as needed      instead.  17.Prednisone 10 mg tabs, 6 tablets on April 30, 2008; and 5 tabs      on May 01, 2008, and May 02, 2008; and 4 tabs on      May 03, 2008, and May 04, 2008; and 3 tabs on May 05, 2008, and May 06, 2008; and 2 tabs on May 07, 2008, and      May 08, 2008; and then 1 tab on May 09, 2008, and May 10, 2008, and then stop.  18.Avelox 400 mg p.o. daily for 4 days.  19.Prescription  __________ nebulizer, mask, and tubing.   PERTINENT LABORATORY DATA:  At time of discharge, cardiac enzymes  negative x3.  D-dimer less than 0.22.  TSH 2.186.  BUN 15 and  creatinine  0.73.  Hemoglobin 13.9, hematocrit 41.8.   DISPOSITION:  She will be discharged to home.   FOLLOWUP:  She should follow up with Dr. Posey Rea in 1-2 weeks and  contact the office for an appointment.  She is also instructed to  contact Dr. Posey Rea should she develop sugars greater than 300,  increased shortness of breath, or fever greater than 101.   PRIMARY CARE PHYSICIAN:  Georgina Quint. Plotnikov, MD   Greater than 30 minutes was spent on discharge planning.      Sandford Craze, NP      Raenette Rover. Felicity Coyer, MD  Electronically Signed    MO/MEDQ  D:  04/29/2008  T:  04/29/2008  Job:  [id number]   cc:   Georgina Quint. Plotnikov, MD

## 2010-12-19 ENCOUNTER — Other Ambulatory Visit: Payer: Self-pay

## 2010-12-19 ENCOUNTER — Other Ambulatory Visit: Payer: Self-pay | Admitting: Internal Medicine

## 2010-12-19 DIAGNOSIS — I10 Essential (primary) hypertension: Secondary | ICD-10-CM

## 2010-12-19 DIAGNOSIS — E119 Type 2 diabetes mellitus without complications: Secondary | ICD-10-CM

## 2010-12-25 ENCOUNTER — Other Ambulatory Visit (INDEPENDENT_AMBULATORY_CARE_PROVIDER_SITE_OTHER): Payer: Medicare Other

## 2010-12-25 DIAGNOSIS — I10 Essential (primary) hypertension: Secondary | ICD-10-CM

## 2010-12-25 DIAGNOSIS — E119 Type 2 diabetes mellitus without complications: Secondary | ICD-10-CM

## 2010-12-25 LAB — BASIC METABOLIC PANEL
BUN: 16 mg/dL (ref 6–23)
Calcium: 9.4 mg/dL (ref 8.4–10.5)
Creatinine, Ser: 0.8 mg/dL (ref 0.4–1.2)
GFR: 73.73 mL/min (ref >60.00)
Glucose, Bld: 119 mg/dL — ABNORMAL HIGH (ref 70–99)

## 2010-12-26 ENCOUNTER — Telehealth: Payer: Self-pay | Admitting: *Deleted

## 2010-12-26 ENCOUNTER — Ambulatory Visit (INDEPENDENT_AMBULATORY_CARE_PROVIDER_SITE_OTHER): Payer: Medicare Other | Admitting: Internal Medicine

## 2010-12-26 ENCOUNTER — Encounter: Payer: Self-pay | Admitting: Internal Medicine

## 2010-12-26 DIAGNOSIS — J449 Chronic obstructive pulmonary disease, unspecified: Secondary | ICD-10-CM

## 2010-12-26 DIAGNOSIS — K219 Gastro-esophageal reflux disease without esophagitis: Secondary | ICD-10-CM

## 2010-12-26 DIAGNOSIS — E538 Deficiency of other specified B group vitamins: Secondary | ICD-10-CM

## 2010-12-26 DIAGNOSIS — E559 Vitamin D deficiency, unspecified: Secondary | ICD-10-CM

## 2010-12-26 DIAGNOSIS — E119 Type 2 diabetes mellitus without complications: Secondary | ICD-10-CM

## 2010-12-26 MED ORDER — RANITIDINE HCL 150 MG PO TABS: 150.0000 mg | ORAL_TABLET | Freq: Two times a day (BID) | ORAL | Status: DC

## 2010-12-26 MED ORDER — ALPRAZOLAM 0.5 MG PO TABS: 0.5000 mg | ORAL_TABLET | Freq: Two times a day (BID) | ORAL | Status: DC

## 2010-12-26 NOTE — Progress Notes (Signed)
  Subjective:    Patient ID: [name] [name], female    DOB: [birth year], 70 y.o.   MRN: [id number]  HPI  The patient presents for a follow-up of  COPD, chronic hypertension, chronic dyslipidemia, type 2 diabetes controlled with medicines Stressed out with husband's CABG.     Review of Systems  Constitutional: Negative for fatigue.  HENT: Negative for congestion.   Respiratory: Positive for shortness of breath. Negative for cough.   Cardiovascular: Positive for chest pain. Negative for leg swelling.  Gastrointestinal: Negative for nausea and blood in stool.  Genitourinary: Negative for flank pain.  Musculoskeletal: Negative for back pain.  Skin: Negative for pallor.  Neurological: Negative for numbness.  Psychiatric/Behavioral: Negative for behavioral problems and self-injury. The patient is not nervous/anxious.        Objective:   Physical Exam  Constitutional: No distress.  HENT:  Mouth/Throat: No oropharyngeal exudate.  Eyes: Left eye exhibits no discharge.  Neck: No JVD present.  Cardiovascular: Exam reveals no friction rub.   Pulmonary/Chest: She has wheezes. She has no rales.  Abdominal: She exhibits no mass. There is no tenderness.  Genitourinary: Guaiac negative stool.  Musculoskeletal: She exhibits no edema.  Skin: No rash noted. No erythema.        Lab Results  Component Value Date   WBC 10.2 08/30/2010   HGB 11.9* 08/30/2010   HCT 34.9* 08/30/2010   PLT 357.0 08/30/2010   CHOL 170 08/30/2010   TRIG 105.0 08/30/2010   HDL 68.00 08/30/2010   LDLDIRECT 108.0 01/16/2010   ALT 23 08/30/2010   AST 20 08/30/2010   NA 140 12/25/2010   K 4.9 12/25/2010   CL 105 12/25/2010   CREATININE 0.8 12/25/2010   BUN 16 12/25/2010   CO2 23 12/25/2010   TSH 3.30 08/30/2010   HGBA1C 7.2* 12/25/2010   MICROALBUR 1.0 08/30/2010     Assessment & Plan:  GERD On Rx  COPD On Rx  DM w/o Complication Type II On Rx Labs reviewed    Stress  Discussed

## 2010-12-26 NOTE — Assessment & Plan Note (Signed)
On Rx 

## 2010-12-26 NOTE — Assessment & Plan Note (Signed)
On Rx Labs reviewed 

## 2011-01-01 ENCOUNTER — Other Ambulatory Visit: Payer: Self-pay | Admitting: *Deleted

## 2011-01-01 MED ORDER — GLUCOSE BLOOD VI STRP: ORAL_STRIP | Status: DC

## 2011-01-03 NOTE — Telephone Encounter (Signed)
Error

## 2011-02-07 ENCOUNTER — Other Ambulatory Visit: Payer: Self-pay | Admitting: Internal Medicine

## 2011-03-25 ENCOUNTER — Other Ambulatory Visit (INDEPENDENT_AMBULATORY_CARE_PROVIDER_SITE_OTHER): Payer: Medicare Other

## 2011-03-25 DIAGNOSIS — Z79899 Other long term (current) drug therapy: Secondary | ICD-10-CM

## 2011-03-25 DIAGNOSIS — E119 Type 2 diabetes mellitus without complications: Secondary | ICD-10-CM

## 2011-03-25 DIAGNOSIS — E538 Deficiency of other specified B group vitamins: Secondary | ICD-10-CM

## 2011-03-25 LAB — COMPREHENSIVE METABOLIC PANEL
ALT: 20 U/L (ref 0–35)
BUN: 15 mg/dL (ref 6–23)
CO2: 28 mEq/L (ref 19–32)
Calcium: 9.4 mg/dL (ref 8.4–10.5)
Creatinine, Ser: 0.8 mg/dL (ref 0.4–1.2)
GFR: 78.06 mL/min (ref >60.00)
Glucose, Bld: 112 mg/dL — ABNORMAL HIGH (ref 70–99)
Total Bilirubin: 0.5 mg/dL (ref 0.3–1.2)

## 2011-03-25 LAB — HEMOGLOBIN A1C: Hgb A1c MFr Bld: 7.1 % — ABNORMAL HIGH (ref 4.6–6.5)

## 2011-03-25 LAB — VITAMIN B12: Vitamin B-12: 832 pg/mL (ref 211–911)

## 2011-03-26 LAB — VITAMIN D 25 HYDROXY (VIT D DEFICIENCY, FRACTURES): Vit D, 25-Hydroxy: 61 ng/mL (ref 30–89)

## 2011-03-27 ENCOUNTER — Encounter: Payer: Self-pay | Admitting: Internal Medicine

## 2011-03-27 ENCOUNTER — Ambulatory Visit (INDEPENDENT_AMBULATORY_CARE_PROVIDER_SITE_OTHER): Payer: Medicare Other | Admitting: Internal Medicine

## 2011-03-27 DIAGNOSIS — F17200 Nicotine dependence, unspecified, uncomplicated: Secondary | ICD-10-CM

## 2011-03-27 DIAGNOSIS — M1990 Unspecified osteoarthritis, unspecified site: Secondary | ICD-10-CM

## 2011-03-27 DIAGNOSIS — E785 Hyperlipidemia, unspecified: Secondary | ICD-10-CM

## 2011-03-27 DIAGNOSIS — Z8659 Personal history of other mental and behavioral disorders: Secondary | ICD-10-CM

## 2011-03-27 DIAGNOSIS — E119 Type 2 diabetes mellitus without complications: Secondary | ICD-10-CM

## 2011-03-27 DIAGNOSIS — F329 Major depressive disorder, single episode, unspecified: Secondary | ICD-10-CM | POA: Insufficient documentation

## 2011-03-27 DIAGNOSIS — J449 Chronic obstructive pulmonary disease, unspecified: Secondary | ICD-10-CM

## 2011-03-27 NOTE — Assessment & Plan Note (Signed)
Cont Rx 

## 2011-03-27 NOTE — Assessment & Plan Note (Signed)
Discussed.

## 2011-03-27 NOTE — Assessment & Plan Note (Signed)
On Rx 

## 2011-03-27 NOTE — Progress Notes (Signed)
  Subjective:    Patient ID: [name] [name], female    DOB: [birth year], 70 y.o.   MRN: [id number]  HPI   The patient presents for a follow-up of  chronic hypertension, chronic dyslipidemia, type 2 diabetes controlled with medicines   Review of Systems  Constitutional: Negative for chills, activity change, appetite change, fatigue and unexpected weight change.  HENT: Negative for congestion, mouth sores and sinus pressure.   Eyes: Negative for visual disturbance.  Respiratory: Positive for shortness of breath. Negative for cough and chest tightness.   Gastrointestinal: Negative for nausea and abdominal pain.  Genitourinary: Negative for frequency, difficulty urinating and vaginal pain.  Musculoskeletal: Negative for back pain and gait problem.  Skin: Negative for pallor and rash.  Neurological: Negative for dizziness, tremors, weakness, numbness and headaches.  Psychiatric/Behavioral: Negative for confusion and sleep disturbance. The patient is nervous/anxious.        Objective:   Physical Exam  Constitutional: She appears well-developed and well-nourished. No distress.  HENT:  Head: Normocephalic.  Right Ear: External ear normal.  Left Ear: External ear normal.  Nose: Nose normal.  Mouth/Throat: Oropharynx is clear and moist.  Eyes: Conjunctivae are normal. Pupils are equal, round, and reactive to light. Right eye exhibits no discharge. Left eye exhibits no discharge.  Neck: Normal range of motion. Neck supple. No JVD present. No tracheal deviation present. No thyromegaly present.  Cardiovascular: Normal rate, regular rhythm and normal heart sounds.   Pulmonary/Chest: No stridor. No respiratory distress. She has no wheezes.  Abdominal: Soft. Bowel sounds are normal. She exhibits no distension and no mass. There is no tenderness. There is no rebound and no guarding.  Musculoskeletal: She exhibits no edema and no tenderness.  Lymphadenopathy:    She has no cervical adenopathy.    Neurological: She displays normal reflexes. No cranial nerve deficit. She exhibits normal muscle tone. Coordination normal.  Skin: No rash noted. No erythema.  Psychiatric: Her behavior is normal. Judgment and thought content normal.     Lab Results  Component Value Date   WBC 10.2 08/30/2010   HGB 11.9* 08/30/2010   HCT 34.9* 08/30/2010   PLT 357.0 08/30/2010   CHOL 170 08/30/2010   TRIG 105.0 08/30/2010   HDL 68.00 08/30/2010   LDLDIRECT 108.0 01/16/2010   ALT 20 03/25/2011   AST 19 03/25/2011   NA 141 03/25/2011   K 4.3 03/25/2011   CL 103 03/25/2011   CREATININE 0.8 03/25/2011   BUN 15 03/25/2011   CO2 28 03/25/2011   TSH 3.30 08/30/2010   HGBA1C 7.1* 03/25/2011   MICROALBUR 1.0 08/30/2010        Assessment & Plan:

## 2011-03-27 NOTE — Assessment & Plan Note (Signed)
Chronic. 

## 2011-05-06 LAB — GLUCOSE, CAPILLARY
Glucose-Capillary: 108 — ABNORMAL HIGH
Glucose-Capillary: 113 — ABNORMAL HIGH
Glucose-Capillary: 134 — ABNORMAL HIGH
Glucose-Capillary: 136 — ABNORMAL HIGH
Glucose-Capillary: 155 — ABNORMAL HIGH
Glucose-Capillary: 167 — ABNORMAL HIGH
Glucose-Capillary: 171 — ABNORMAL HIGH
Glucose-Capillary: 239 — ABNORMAL HIGH
Glucose-Capillary: 246 — ABNORMAL HIGH

## 2011-05-06 LAB — HEMOGLOBIN A1C: Hgb A1c MFr Bld: 7.1 — ABNORMAL HIGH

## 2011-05-06 LAB — LIPID PANEL
HDL: 57
LDL Cholesterol: 94
Total CHOL/HDL Ratio: 3
VLDL: 21

## 2011-05-06 LAB — COMPREHENSIVE METABOLIC PANEL
ALT: 27
CO2: 27
Calcium: 9.6
Creatinine, Ser: 0.73
GFR calc Af Amer: >60
GFR calc non Af Amer: >60
Glucose, Bld: 153 — ABNORMAL HIGH
Sodium: 138
Total Protein: 7.2

## 2011-05-06 LAB — DIFFERENTIAL
Eosinophils Absolute: 0.2
Eosinophils Relative: 2
Lymphs Abs: 3.5
Monocytes Relative: 6

## 2011-05-06 LAB — CARDIAC PANEL(CRET KIN+CKTOT+MB+TROPI)
Relative Index: 1.6
Relative Index: 1.8
Relative Index: INVALID
Total CK: 103
Total CK: 95
Troponin I: 0.04
Troponin I: <0.01

## 2011-05-06 LAB — CBC
HCT: 41.8
MCV: 91.6
RBC: 4.56
WBC: 12.1 — ABNORMAL HIGH

## 2011-05-06 LAB — TSH: TSH: 2.186

## 2011-05-07 ENCOUNTER — Ambulatory Visit (INDEPENDENT_AMBULATORY_CARE_PROVIDER_SITE_OTHER): Payer: Medicare Other

## 2011-05-07 DIAGNOSIS — Z23 Encounter for immunization: Secondary | ICD-10-CM

## 2011-05-23 ENCOUNTER — Other Ambulatory Visit: Payer: Self-pay | Admitting: Obstetrics & Gynecology

## 2011-05-23 DIAGNOSIS — Z9012 Acquired absence of left breast and nipple: Secondary | ICD-10-CM

## 2011-05-23 DIAGNOSIS — Z1231 Encounter for screening mammogram for malignant neoplasm of breast: Secondary | ICD-10-CM

## 2011-06-12 ENCOUNTER — Other Ambulatory Visit: Payer: Self-pay | Admitting: *Deleted

## 2011-06-12 ENCOUNTER — Ambulatory Visit
Admission: RE | Admit: 2011-06-12 | Discharge: 2011-06-12 | Disposition: A | Discharge status: Home / Self Care | Payer: Medicare Other | Source: Ambulatory Visit | Attending: Obstetrics & Gynecology | Admitting: Obstetrics & Gynecology

## 2011-06-12 DIAGNOSIS — Z9012 Acquired absence of left breast and nipple: Secondary | ICD-10-CM

## 2011-06-12 DIAGNOSIS — Z1231 Encounter for screening mammogram for malignant neoplasm of breast: Secondary | ICD-10-CM

## 2011-06-12 MED ORDER — SERTRALINE HCL 100 MG PO TABS: 100.0000 mg | ORAL_TABLET | Freq: Every day | ORAL | Status: DC

## 2011-06-24 ENCOUNTER — Other Ambulatory Visit (INDEPENDENT_AMBULATORY_CARE_PROVIDER_SITE_OTHER): Payer: Medicare Other

## 2011-06-24 DIAGNOSIS — E119 Type 2 diabetes mellitus without complications: Secondary | ICD-10-CM

## 2011-06-24 DIAGNOSIS — E785 Hyperlipidemia, unspecified: Secondary | ICD-10-CM

## 2011-06-24 LAB — COMPREHENSIVE METABOLIC PANEL
AST: 40 U/L — ABNORMAL HIGH (ref 0–37)
Albumin: 4 g/dL (ref 3.5–5.2)
Alkaline Phosphatase: 67 U/L (ref 39–117)
Potassium: 4.4 mEq/L (ref 3.5–5.1)
Sodium: 140 mEq/L (ref 135–145)
Total Bilirubin: 0.4 mg/dL (ref 0.3–1.2)
Total Protein: 7.2 g/dL (ref 6.0–8.3)

## 2011-06-25 ENCOUNTER — Ambulatory Visit (INDEPENDENT_AMBULATORY_CARE_PROVIDER_SITE_OTHER): Payer: Medicare Other | Admitting: Internal Medicine

## 2011-06-25 ENCOUNTER — Encounter: Payer: Self-pay | Admitting: Internal Medicine

## 2011-06-25 VITALS — BP 118/56 | HR 100 | Temp 98.7°F | Resp 16 | Wt 135.0 lb

## 2011-06-25 DIAGNOSIS — K219 Gastro-esophageal reflux disease without esophagitis: Secondary | ICD-10-CM

## 2011-06-25 DIAGNOSIS — E119 Type 2 diabetes mellitus without complications: Secondary | ICD-10-CM

## 2011-06-25 DIAGNOSIS — E785 Hyperlipidemia, unspecified: Secondary | ICD-10-CM

## 2011-06-25 DIAGNOSIS — F329 Major depressive disorder, single episode, unspecified: Secondary | ICD-10-CM

## 2011-06-25 DIAGNOSIS — M1990 Unspecified osteoarthritis, unspecified site: Secondary | ICD-10-CM

## 2011-06-25 DIAGNOSIS — J449 Chronic obstructive pulmonary disease, unspecified: Secondary | ICD-10-CM

## 2011-06-25 MED ORDER — ALPRAZOLAM 0.5 MG PO TABS: 0.5000 mg | ORAL_TABLET | Freq: Two times a day (BID) | ORAL | Status: DC

## 2011-06-25 MED ORDER — OMEPRAZOLE 40 MG PO CPDR: 40.0000 mg | DELAYED_RELEASE_CAPSULE | Freq: Every day | ORAL | Status: DC

## 2011-06-25 MED ORDER — PREDNISONE 10 MG PO TABS: 10.0000 mg | ORAL_TABLET | ORAL | Status: DC

## 2011-06-25 NOTE — Progress Notes (Signed)
  Subjective:    Patient ID: [name] [name], female    DOB: 09/06/1940, 70 y.o.   MRN: [id number]  HPI  The patient presents for a follow-up of  chronic COPD, hypertension, chronic dyslipidemia, type 2 diabetes controlled with medicines    Review of Systems  Constitutional: Positive for fatigue. Negative for chills, activity change, appetite change and unexpected weight change.  HENT: Negative for congestion, mouth sores and sinus pressure.   Eyes: Negative for visual disturbance.  Respiratory: Positive for cough and shortness of breath. Negative for chest tightness.   Gastrointestinal: Negative for nausea and abdominal pain.  Genitourinary: Negative for frequency, difficulty urinating and vaginal pain.  Musculoskeletal: Negative for back pain and gait problem.  Skin: Negative for pallor and rash.  Neurological: Negative for dizziness, tremors, weakness, numbness and headaches.  Psychiatric/Behavioral: Negative for confusion and sleep disturbance.       Objective:   Physical Exam  Constitutional: She appears well-developed and well-nourished. No distress.  HENT:  Head: Normocephalic.  Right Ear: External ear normal.  Left Ear: External ear normal.  Nose: Nose normal.  Mouth/Throat: Oropharynx is clear and moist.  Eyes: Conjunctivae are normal. Pupils are equal, round, and reactive to light. Right eye exhibits no discharge. Left eye exhibits no discharge.  Neck: Normal range of motion. Neck supple. No JVD present. No tracheal deviation present. No thyromegaly present.  Cardiovascular: Normal rate, regular rhythm and normal heart sounds.   Pulmonary/Chest: No stridor. No respiratory distress. She has wheezes.  Abdominal: Soft. Bowel sounds are normal. She exhibits no distension and no mass. There is no tenderness. There is no rebound and no guarding.  Musculoskeletal: She exhibits no edema and no tenderness.  Lymphadenopathy:    She has no cervical adenopathy.  Neurological: She  displays normal reflexes. No cranial nerve deficit. She exhibits normal muscle tone. Coordination normal.  Skin: No rash noted. No erythema.  Psychiatric: She has a normal mood and affect. Her behavior is normal. Judgment and thought content normal.    Wt Readings from Last 3 Encounters:  06/25/11 135 lb (61.236 kg)  03/27/11 135 lb (61.236 kg)  12/26/10 141 lb (63.957 kg)   Lab Results  Component Value Date   WBC 10.2 08/30/2010   HGB 11.9* 08/30/2010   HCT 34.9* 08/30/2010   PLT 357.0 08/30/2010   GLUCOSE 125* 06/24/2011   CHOL 170 08/30/2010   TRIG 105.0 08/30/2010   HDL 68.00 08/30/2010   LDLDIRECT 108.0 01/16/2010   LDLCALC 81 08/30/2010   ALT 45* 06/24/2011   AST 40* 06/24/2011   NA 140 06/24/2011   K 4.4 06/24/2011   CL 106 06/24/2011   CREATININE 0.8 06/24/2011   BUN 15 06/24/2011   CO2 24 06/24/2011   TSH 3.30 08/30/2010   INR 1.0 04/25/2008   HGBA1C 7.3* 06/24/2011   MICROALBUR 1.0 08/30/2010         Assessment & Plan:

## 2011-06-25 NOTE — Assessment & Plan Note (Signed)
Chronic - better; weaning off Wellbutrin

## 2011-06-25 NOTE — Assessment & Plan Note (Signed)
Continue with current prescription therapy as reflected on the Med list.  

## 2011-07-02 ENCOUNTER — Other Ambulatory Visit: Payer: Self-pay | Admitting: *Deleted

## 2011-07-02 MED ORDER — BUPROPION HCL ER (SR) 150 MG PO TB12: 150.0000 mg | ORAL_TABLET | Freq: Two times a day (BID) | ORAL | Status: DC

## 2011-08-15 ENCOUNTER — Encounter: Payer: Self-pay | Admitting: Internal Medicine

## 2011-08-15 ENCOUNTER — Ambulatory Visit (INDEPENDENT_AMBULATORY_CARE_PROVIDER_SITE_OTHER): Payer: Medicare Other | Admitting: Internal Medicine

## 2011-08-15 VITALS — BP 112/62 | HR 88 | Temp 97.4°F | Resp 16 | Wt 139.0 lb

## 2011-08-15 DIAGNOSIS — J209 Acute bronchitis, unspecified: Secondary | ICD-10-CM

## 2011-08-15 DIAGNOSIS — J449 Chronic obstructive pulmonary disease, unspecified: Secondary | ICD-10-CM

## 2011-08-15 MED ORDER — DOXYCYCLINE HYCLATE 100 MG PO TABS: 100.0000 mg | ORAL_TABLET | Freq: Two times a day (BID) | ORAL | Status: AC

## 2011-08-15 MED ORDER — PROMETHAZINE-CODEINE 6.25-10 MG/5ML PO SYRP: 5.0000 mL | ORAL_SOLUTION | ORAL | Status: AC | PRN

## 2011-08-15 MED ORDER — METHYLPREDNISOLONE ACETATE PF 80 MG/ML IJ SUSP
120.0000 mg | Freq: Once | INTRAMUSCULAR | Status: AC
  Administered 2011-08-15: 120 mg via INTRAMUSCULAR

## 2011-08-15 NOTE — Assessment & Plan Note (Signed)
See Meds 

## 2011-08-15 NOTE — Assessment & Plan Note (Signed)
Discussed.

## 2011-08-18 NOTE — Progress Notes (Signed)
  Subjective:    Patient ID: [name] [name], female    DOB: August 14, 1940, 71 y.o.   MRN: [id number]  HPI    HPI  C/o URI sx's x 5  days. C/o ST, cough, SOB, weakness. Not better with OTC medicines. Actually, the patient is getting worse. The patient did not sleep last night due to cough.  Review of Systems  Constitutional: Positive for fever, chills and fatigue.  HENT: Positive for congestion, rhinorrhea, sneezing and postnasal drip.   Eyes: Positive for photophobia and pain. Negative for discharge and visual disturbance.  Respiratory: Positive for cough and wheezing.   Positive for chest pain.  Gastrointestinal: Negative for vomiting, abdominal pain, diarrhea and abdominal distention.  Genitourinary: Negative for dysuria and difficulty urinating.  Skin: Negative for rash.  Neurological: Positive for dizziness, weakness and light-headedness.     Review of Systems  Constitutional: Positive for fever and fatigue. Negative for chills, activity change, appetite change and unexpected weight change.  HENT: Positive for congestion and rhinorrhea. Negative for mouth sores and sinus pressure.   Eyes: Negative for visual disturbance.  Respiratory: Positive for cough, shortness of breath and wheezing. Negative for chest tightness.   Gastrointestinal: Negative for nausea and abdominal pain.  Genitourinary: Negative for frequency, difficulty urinating and vaginal pain.  Musculoskeletal: Negative for back pain and gait problem.  Skin: Negative for pallor and rash.  Neurological: Negative for dizziness, tremors, weakness, numbness and headaches.  Psychiatric/Behavioral: Negative for confusion and sleep disturbance.       Objective:   Physical Exam  Constitutional: She appears well-developed. No distress.  HENT:  Head: Normocephalic.  Right Ear: External ear normal.  Left Ear: External ear normal.  Nose: Nose normal.  Mouth/Throat: Oropharynx is clear and moist.       eryth throat  Eyes:  Conjunctivae are normal. Pupils are equal, round, and reactive to light. Right eye exhibits no discharge. Left eye exhibits no discharge.  Neck: Normal range of motion. Neck supple. No JVD present. No tracheal deviation present. No thyromegaly present.  Cardiovascular: Normal rate, regular rhythm and normal heart sounds.   Pulmonary/Chest: No stridor. No respiratory distress. She has no wheezes. She has no rales.       Coarse BS B  Abdominal: Soft. Bowel sounds are normal. She exhibits no distension and no mass. There is no tenderness. There is no rebound and no guarding.  Musculoskeletal: She exhibits no edema and no tenderness.  Lymphadenopathy:    She has no cervical adenopathy.  Neurological: She displays normal reflexes. No cranial nerve deficit. She exhibits normal muscle tone. Coordination normal.  Skin: No rash noted. No erythema.  Psychiatric: She has a normal mood and affect. Her behavior is normal. Judgment and thought content normal.          Assessment & Plan:

## 2011-08-26 ENCOUNTER — Other Ambulatory Visit: Payer: Self-pay | Admitting: *Deleted

## 2011-08-26 MED ORDER — BUSPIRONE HCL 15 MG PO TABS: 15.0000 mg | ORAL_TABLET | Freq: Two times a day (BID) | ORAL | Status: DC

## 2011-08-26 MED ORDER — LOVASTATIN 40 MG PO TABS: 40.0000 mg | ORAL_TABLET | Freq: Every day | ORAL | Status: DC

## 2011-08-26 MED ORDER — ATENOLOL 50 MG PO TABS: 25.0000 mg | ORAL_TABLET | Freq: Two times a day (BID) | ORAL | Status: DC

## 2011-09-12 ENCOUNTER — Other Ambulatory Visit: Payer: Self-pay | Admitting: *Deleted

## 2011-09-12 MED ORDER — METFORMIN HCL 1000 MG PO TABS: 1000.0000 mg | ORAL_TABLET | Freq: Two times a day (BID) | ORAL | Status: DC

## 2011-10-02 ENCOUNTER — Other Ambulatory Visit: Payer: Self-pay | Admitting: Internal Medicine

## 2011-10-03 ENCOUNTER — Other Ambulatory Visit: Payer: Self-pay | Admitting: *Deleted

## 2011-10-03 MED ORDER — BUDESONIDE-FORMOTEROL FUMARATE 160-4.5 MCG/ACT IN AERO: 2.0000 | INHALATION_SPRAY | Freq: Two times a day (BID) | RESPIRATORY_TRACT | Status: DC

## 2011-10-04 ENCOUNTER — Other Ambulatory Visit (INDEPENDENT_AMBULATORY_CARE_PROVIDER_SITE_OTHER): Payer: Medicare Other

## 2011-10-04 DIAGNOSIS — E119 Type 2 diabetes mellitus without complications: Secondary | ICD-10-CM

## 2011-10-04 LAB — BASIC METABOLIC PANEL
BUN: 16 mg/dL (ref 6–23)
CO2: 27 mEq/L (ref 19–32)
Chloride: 103 mEq/L (ref 96–112)
Creatinine, Ser: 0.9 mg/dL (ref 0.4–1.2)
Potassium: 4.5 mEq/L (ref 3.5–5.1)

## 2011-10-07 ENCOUNTER — Other Ambulatory Visit: Payer: Self-pay | Admitting: Endocrinology

## 2011-10-07 ENCOUNTER — Ambulatory Visit: Payer: Medicare Other | Admitting: Internal Medicine

## 2011-10-07 ENCOUNTER — Ambulatory Visit (INDEPENDENT_AMBULATORY_CARE_PROVIDER_SITE_OTHER): Payer: Medicare Other | Admitting: Endocrinology

## 2011-10-07 ENCOUNTER — Encounter: Payer: Self-pay | Admitting: Endocrinology

## 2011-10-07 ENCOUNTER — Ambulatory Visit (HOSPITAL_COMMUNITY)
Admission: RE | Admit: 2011-10-07 | Discharge: 2011-10-07 | Disposition: A | Discharge status: Home / Self Care | Payer: Medicare Other | Source: Ambulatory Visit | Attending: Endocrinology | Admitting: Endocrinology

## 2011-10-07 VITALS — BP 112/68 | HR 92 | Temp 98.1°F | Wt 139.0 lb

## 2011-10-07 DIAGNOSIS — R109 Unspecified abdominal pain: Secondary | ICD-10-CM | POA: Insufficient documentation

## 2011-10-07 DIAGNOSIS — R142 Eructation: Secondary | ICD-10-CM | POA: Insufficient documentation

## 2011-10-07 DIAGNOSIS — M479 Spondylosis, unspecified: Secondary | ICD-10-CM | POA: Insufficient documentation

## 2011-10-07 DIAGNOSIS — R141 Gas pain: Secondary | ICD-10-CM | POA: Insufficient documentation

## 2011-10-07 DIAGNOSIS — R140 Abdominal distension (gaseous): Secondary | ICD-10-CM

## 2011-10-07 DIAGNOSIS — D259 Leiomyoma of uterus, unspecified: Secondary | ICD-10-CM | POA: Insufficient documentation

## 2011-10-07 DIAGNOSIS — R143 Flatulence: Secondary | ICD-10-CM | POA: Insufficient documentation

## 2011-10-07 NOTE — Progress Notes (Signed)
Subjective:    Patient ID: [name] [name], female    DOB: November 28, 1940, 71 y.o.   MRN: [id number]  HPI Pt states 10 days of moderate bloating of the abdomen, and assoc slight left lower quadrant pain.  She says the metformin gives her loose BM's, but she does not have diarrhea.  No help with simethicone or dulcolax.  She says she does not take laxatives in general.   Past Medical History  Diagnosis Date  . History of breast cancer   . Cancer     breast  . COPD (chronic obstructive pulmonary disease)   . GERD (gastroesophageal reflux disease)   . Diabetes mellitus     type II  . Hx of colonic polyps   . Osteoarthritis     Past Surgical History  Procedure Date  . Cesarean section 1980, 1982    X 2   . Laparoscopy     work up for infertility  . Tympanoplasty     left X 2  . Left breast fibroadenoma removal 1960's  . Breast cyst aspirations   . Left modified radial mastectomy     History   Social History  . Marital Status: Married    Spouse Name: N/A    Number of Children: N/A  . Years of Education: N/A   Occupational History  . Not on file.   Social History Main Topics  . Smoking status: Current Everyday Smoker  . Smokeless tobacco: Not on file   Comment: 1.5 ppd, quit again 2009; restarted 1 ppd 2011  . Alcohol Use: No  . Drug Use: No  . Sexually Active: Not Currently   Other Topics Concern  . Not on file   Social History Narrative  . No narrative on file    Current Outpatient Prescriptions on File Prior to Visit  Medication Sig Dispense Refill  . albuterol-ipratropium (COMBIVENT) 18-103 MCG/ACT inhaler Inhale 2 puffs into the lungs 4 (four) times daily.        Marland Kitchen ALPRAZolam (XANAX) 0.5 MG tablet Take 1 tablet (0.5 mg total) by mouth 2 (two) times daily.  60 tablet  5  . aspirin 81 MG tablet Take 81 mg by mouth daily.        Marland Kitchen atenolol (TENORMIN) 50 MG tablet Take 0.5 tablets (25 mg total) by mouth 2 (two) times daily.  90 tablet  1  . b complex vitamins  tablet Take 1 tablet by mouth daily.        . budesonide-formoterol (SYMBICORT) 160-4.5 MCG/ACT inhaler Inhale 2 puffs into the lungs 2 (two) times daily.  1 Inhaler  5  . buPROPion (WELLBUTRIN SR) 150 MG 12 hr tablet Take 1 tablet (150 mg total) by mouth 2 (two) times daily.  180 tablet  2  . busPIRone (BUSPAR) 15 MG tablet Take 1 tablet (15 mg total) by mouth 2 (two) times daily.  180 tablet  1  . cholecalciferol (VITAMIN D) 1000 UNITS tablet Take 1,000 Units by mouth daily.        . Diclofenac Sodium (PENNSAID) 1.5 % SOLN Place 3-5 drops onto the skin 3 (three) times daily as needed.        Marland Kitchen glucose blood (ONE TOUCH TEST STRIPS) test strip Use as instructed  100 each  3  . ipratropium-albuterol (DUONEB) 0.5-2.5 (3) MG/3ML SOLN INHALE 1 VIAL VIA HAND HELD NEBULIZER 4 TIMES A DAY  360 mL  3  . lovastatin (MEVACOR) 40 MG tablet Take 1 tablet (40 mg  total) by mouth at bedtime.  90 tablet  2  . metFORMIN (GLUCOPHAGE) 1000 MG tablet Take 1 tablet (1,000 mg total) by mouth 2 (two) times daily with a meal.  180 tablet  3  . omeprazole (PRILOSEC) 40 MG capsule Take 1 capsule (40 mg total) by mouth daily.  90 capsule  3  . ranitidine (ZANTAC) 150 MG tablet Take 1 tablet (150 mg total) by mouth 2 (two) times daily.  180 tablet  3  . repaglinide (PRANDIN) 2 MG tablet Take 2 mg by mouth 3 (three) times daily as needed.       . roflumilast (DALIRESP) 500 MCG TABS tablet Take 500 mcg by mouth daily.        . sertraline (ZOLOFT) 100 MG tablet Take 1 tablet (100 mg total) by mouth daily.  90 tablet  1  . predniSONE (DELTASONE) 10 MG tablet Take 1 tablet (10 mg total) by mouth as directed. Prednisone 10 mg: take 4 tabs a day x 3 days; then 3 tabs a day x 4 days; then 2 tabs a day x 4 days, then 1 tab a day x 6 days, then stop. Take pc.   38 tablet  1    Allergies  Allergen Reactions  . Varenicline Tartrate     Family History  Problem Relation Age of Onset  . Cancer Mother     endometrial/ colon  .  Cancer Brother     colon  . COPD Other   . Hypertension Other     BP 112/68  Pulse 92  Temp(Src) 98.1 F (36.7 C) (Oral)  Wt 139 lb (63.05 kg)  SpO2 93%   Review of Systems Denies brbpr/n/v/weight change    Objective:   Physical Exam VITAL SIGNS:  See vs page. GENERAL: no distress. ABDOMEN: abdomen is soft, nontender.  no hepatosplenomegaly.  slightly distended.  no hernia.    Lab Results  Component Value Date   HGBA1C 7.5* 10/04/2011  x-rays: neg    Assessment & Plan:  Gi sxs, new.  ? Related to metformin

## 2011-10-07 NOTE — Patient Instructions (Addendum)
X-rays are requested for you today.  please call [phone] to hear your test results.  You will be prompted to enter the 9-digit "MRN" number that appears at the top left of this page, followed by #.  Then you will hear the message. I hope you feel better soon.  If you don't feel better in a few days, please call your doctor.  (update: i left message on phone-tree:  rx as we discussed)

## 2011-10-14 ENCOUNTER — Encounter: Payer: Self-pay | Admitting: Endocrinology

## 2011-10-14 ENCOUNTER — Ambulatory Visit (INDEPENDENT_AMBULATORY_CARE_PROVIDER_SITE_OTHER): Payer: Medicare Other | Admitting: Endocrinology

## 2011-10-14 VITALS — BP 112/72 | HR 100 | Temp 97.8°F | Ht 64.0 in | Wt 139.0 lb

## 2011-10-14 DIAGNOSIS — R140 Abdominal distension (gaseous): Secondary | ICD-10-CM

## 2011-10-14 DIAGNOSIS — R109 Unspecified abdominal pain: Secondary | ICD-10-CM | POA: Insufficient documentation

## 2011-10-14 DIAGNOSIS — R142 Eructation: Secondary | ICD-10-CM

## 2011-10-14 NOTE — Progress Notes (Signed)
Subjective:    Patient ID: [name] [name], female    DOB: 10/09/1940, 71 y.o.   MRN: [id number]  HPI Bloating persists.  Only minimal improvement with dulcolax.  There is slight assoc pain.  No weight change.  Only previous abd or pelvic surgery is c-section.   Past Medical History  Diagnosis Date  . History of breast cancer   . Cancer     breast  . COPD (chronic obstructive pulmonary disease)   . GERD (gastroesophageal reflux disease)   . Diabetes mellitus     type II  . Hx of colonic polyps   . Osteoarthritis     Past Surgical History  Procedure Date  . Cesarean section 1980, 1982    X 2   . Laparoscopy     work up for infertility  . Tympanoplasty     left X 2  . Left breast fibroadenoma removal 1960's  . Breast cyst aspirations   . Left modified radial mastectomy     History   Social History  . Marital Status: Married    Spouse Name: N/A    Number of Children: N/A  . Years of Education: N/A   Occupational History  . Not on file.   Social History Main Topics  . Smoking status: Current Everyday Smoker  . Smokeless tobacco: Not on file   Comment: 1.5 ppd, quit again 2009; restarted 1 ppd 2011  . Alcohol Use: No  . Drug Use: No  . Sexually Active: Not Currently   Other Topics Concern  . Not on file   Social History Narrative  . No narrative on file    Current Outpatient Prescriptions on File Prior to Visit  Medication Sig Dispense Refill  . albuterol-ipratropium (COMBIVENT) 18-103 MCG/ACT inhaler Inhale 2 puffs into the lungs 4 (four) times daily.        Marland Kitchen ALPRAZolam (XANAX) 0.5 MG tablet Take 1 tablet (0.5 mg total) by mouth 2 (two) times daily.  60 tablet  5  . aspirin 81 MG tablet Take 81 mg by mouth daily.        Marland Kitchen atenolol (TENORMIN) 50 MG tablet Take 0.5 tablets (25 mg total) by mouth 2 (two) times daily.  90 tablet  1  . b complex vitamins tablet Take 1 tablet by mouth daily.        . budesonide-formoterol (SYMBICORT) 160-4.5 MCG/ACT inhaler  Inhale 2 puffs into the lungs 2 (two) times daily.  1 Inhaler  5  . buPROPion (WELLBUTRIN SR) 150 MG 12 hr tablet Take 1 tablet (150 mg total) by mouth 2 (two) times daily.  180 tablet  2  . busPIRone (BUSPAR) 15 MG tablet Take 1 tablet (15 mg total) by mouth 2 (two) times daily.  180 tablet  1  . cholecalciferol (VITAMIN D) 1000 UNITS tablet Take 1,000 Units by mouth daily.        . Diclofenac Sodium (PENNSAID) 1.5 % SOLN Place 3-5 drops onto the skin 3 (three) times daily as needed.        Marland Kitchen glucose blood (ONE TOUCH TEST STRIPS) test strip Use as instructed  100 each  3  . ipratropium-albuterol (DUONEB) 0.5-2.5 (3) MG/3ML SOLN INHALE 1 VIAL VIA HAND HELD NEBULIZER 4 TIMES A DAY  360 mL  3  . lovastatin (MEVACOR) 40 MG tablet Take 1 tablet (40 mg total) by mouth at bedtime.  90 tablet  2  . metFORMIN (GLUCOPHAGE) 1000 MG tablet Take 1 tablet (1,000 mg  total) by mouth 2 (two) times daily with a meal.  180 tablet  3  . omeprazole (PRILOSEC) 40 MG capsule Take 1 capsule (40 mg total) by mouth daily.  90 capsule  3  . ranitidine (ZANTAC) 150 MG tablet Take 1 tablet (150 mg total) by mouth 2 (two) times daily.  180 tablet  3  . repaglinide (PRANDIN) 2 MG tablet Take 2 mg by mouth 3 (three) times daily as needed.       . roflumilast (DALIRESP) 500 MCG TABS tablet Take 500 mcg by mouth daily.        . sertraline (ZOLOFT) 100 MG tablet Take 1 tablet (100 mg total) by mouth daily.  90 tablet  1    Allergies  Allergen Reactions  . Varenicline Tartrate     Family History  Problem Relation Age of Onset  . Cancer Mother     endometrial/ colon  . Cancer Brother     colon  . COPD Other   . Hypertension Other     BP 112/72  Pulse 100  Temp(Src) 97.8 F (36.6 C) (Oral)  Ht 5\' 4"  (1.626 m)  Wt 139 lb (63.05 kg)  BMI 23.86 kg/m2  SpO2 96%  Review of Systems Denies fever/n/v/d.      Objective:   Physical Exam VITAL SIGNS:  See vs page GENERAL: no distress ABDOMEN: soft, nontender.  no  hepatosplenomegaly. moderately distended.  no hernia       Assessment & Plan:  abd distention and pain, uncertain etiology

## 2011-10-14 NOTE — Patient Instructions (Signed)
A CT scan is requested for you today.  you will receive a phone call, about a day and time for an appointment.  then please call [phone] to hear your test results.  You will be prompted to enter the 9-digit "MRN" number that appears at the top left of this page, followed by #.  Then you will hear the message.

## 2011-10-15 ENCOUNTER — Ambulatory Visit (INDEPENDENT_AMBULATORY_CARE_PROVIDER_SITE_OTHER)
Admission: RE | Admit: 2011-10-15 | Discharge: 2011-10-15 | Disposition: A | Discharge status: Home / Self Care | Payer: Medicare Other | Source: Ambulatory Visit | Attending: Endocrinology | Admitting: Endocrinology

## 2011-10-15 ENCOUNTER — Other Ambulatory Visit: Payer: Self-pay | Admitting: Endocrinology

## 2011-10-15 DIAGNOSIS — R109 Unspecified abdominal pain: Secondary | ICD-10-CM

## 2011-10-15 MED ORDER — IOHEXOL 300 MG/ML  SOLN
100.0000 mL | Freq: Once | INTRAMUSCULAR | Status: AC | PRN
  Administered 2011-10-15: 100 mL via INTRAVENOUS

## 2011-10-16 ENCOUNTER — Ambulatory Visit: Payer: Medicare Other | Admitting: Endocrinology

## 2011-10-16 ENCOUNTER — Telehealth: Payer: Self-pay | Admitting: Endocrinology

## 2011-10-16 DIAGNOSIS — K668 Other specified disorders of peritoneum: Secondary | ICD-10-CM

## 2011-10-16 DIAGNOSIS — C569 Malignant neoplasm of unspecified ovary: Secondary | ICD-10-CM

## 2011-10-16 DIAGNOSIS — R109 Unspecified abdominal pain: Secondary | ICD-10-CM

## 2011-10-16 DIAGNOSIS — R140 Abdominal distension (gaseous): Secondary | ICD-10-CM

## 2011-10-16 DIAGNOSIS — R1900 Intra-abdominal and pelvic swelling, mass and lump, unspecified site: Secondary | ICD-10-CM

## 2011-10-16 NOTE — Telephone Encounter (Signed)
i called pt 10/17/11.  Come in for ca-125.  i ref to oncol

## 2011-10-17 LAB — CA 125: CA 125: 2050.7 U/mL — ABNORMAL HIGH (ref 0.0–30.2)

## 2011-10-17 NOTE — Patient Instructions (Signed)
i called pt 10/17/11.  i gave result.  i changed ref to gyn-onc

## 2011-10-18 ENCOUNTER — Telehealth: Payer: Self-pay | Admitting: *Deleted

## 2011-10-18 ENCOUNTER — Ambulatory Visit (HOSPITAL_COMMUNITY)
Admission: RE | Admit: 2011-10-18 | Discharge: 2011-10-18 | Disposition: A | Discharge status: Home / Self Care | Payer: Medicare Other | Source: Ambulatory Visit | Attending: Endocrinology | Admitting: Endocrinology

## 2011-10-18 DIAGNOSIS — R180 Malignant ascites: Secondary | ICD-10-CM | POA: Insufficient documentation

## 2011-10-18 NOTE — Telephone Encounter (Signed)
i ordered

## 2011-10-18 NOTE — Procedures (Signed)
Successful diag and therapeutic paracentesis Fluid sent for labs and cyto No comp Stable Full report in PACS

## 2011-10-18 NOTE — Telephone Encounter (Signed)
[facility] called-they need an order for cytology placed into Epic for the fluid from the paracentesis to be sent to hospital to be evaluated.

## 2011-10-21 ENCOUNTER — Encounter: Payer: Self-pay | Admitting: Internal Medicine

## 2011-10-21 ENCOUNTER — Ambulatory Visit (INDEPENDENT_AMBULATORY_CARE_PROVIDER_SITE_OTHER): Payer: Medicare Other | Admitting: Internal Medicine

## 2011-10-21 ENCOUNTER — Telehealth (HOSPITAL_COMMUNITY): Payer: Self-pay | Admitting: Radiology

## 2011-10-21 ENCOUNTER — Other Ambulatory Visit (HOSPITAL_COMMUNITY): Payer: Medicare Other

## 2011-10-21 VITALS — BP 110/60 | HR 108 | Temp 97.7°F | Resp 16 | Wt 136.5 lb

## 2011-10-21 DIAGNOSIS — K668 Other specified disorders of peritoneum: Secondary | ICD-10-CM

## 2011-10-21 DIAGNOSIS — R188 Other ascites: Secondary | ICD-10-CM

## 2011-10-21 DIAGNOSIS — E119 Type 2 diabetes mellitus without complications: Secondary | ICD-10-CM

## 2011-10-21 NOTE — Assessment & Plan Note (Signed)
Continue with current prescription therapy as reflected on the Med list.  

## 2011-10-21 NOTE — Assessment & Plan Note (Signed)
313

## 2011-10-21 NOTE — Assessment & Plan Note (Addendum)
CT ABDOMEN AND PELVIS WITH CONTRAST 10/14/11 Technique: Multidetector CT imaging of the abdomen and pelvis was  performed following the standard protocol during bolus  administration of intravenous contrast.  Contrast: OMNIPAQUE IOHEXOL 300 MG/ML IJ SOLN  Comparison: 12/07/2007  Findings: Patchy/nodular opacity at the right lung base (series  3/image 14).  Liver, spleen, pancreas, and adrenal glands are within normal  limits.  Gallbladder is distended. No intrahepatic or extrahepatic ductal  dilatation.  Tiny right renal cysts. Left kidney is unremarkable. No  hydronephrosis.  No evidence of bowel obstruction. Normal appendix.  Atherosclerotic calcifications of the abdominal aorta and branch  vessels.  Moderate abdominopelvic ascites. Omental caking, including a 2.6 x  6.6 cm omental implant in the anterior abdomen (series 2/image 49).  Additional widespread peritoneal implants including abnormal soft  tissue in the pelvic cul-de-sac (series 2/images 70-71).  Small fat and fluid containing periumbilical hernia with and  associated 9 mm subcutaneous implant (series 2/image 43).  Uterus is notable for calcified uterine fibroids. Bilateral  ovaries are poorly evaluated by CT but mildly heterogeneous and  likely mildly enlarged for age/menopausal status, left greater than  right (series 2/image 68).  Bladder is unremarkable.  Degenerative changes of the visualized thoracolumbar spine.  IMPRESSION:  Moderate malignant abdominopelvic ascites with peritoneal  implants/omental caking, as described above.  This appearance is worrisome for metastatic ovarian cancer,  although ovaries are poorly evaluated by CT.  Tissue sampling is suggested, either via diagnostic paracentesis or  possibly ultrasound-guided biopsy of the periumbilical  nodule/omental caking.  These results were called by telephone on 10/15/2011 at 1540  hours to Dr. Romero Belling, who verbally acknowledged these    results.  Original Report Authenticated By: Charline Bills, M.D.  Dr Nelly Rout appt fot 3/[id number]

## 2011-10-21 NOTE — Progress Notes (Signed)
  Subjective:    Patient ID: [name] [name], female    DOB: 1940/08/29, 71 y.o.   MRN: [id number]  HPI  F/u abn CT and ascitis No abd pain  Wt Readings from Last 3 Encounters:  10/21/11 136 lb 8 oz (61.916 kg)  10/14/11 139 lb (63.05 kg)  10/07/11 139 lb (63.05 kg)   BP Readings from Last 3 Encounters:  10/21/11 110/60  10/18/11 121/60  10/14/11 112/72      Review of Systems  Constitutional: Positive for fatigue and unexpected weight change. Negative for chills, activity change and appetite change.  HENT: Negative for congestion, mouth sores and sinus pressure.   Eyes: Negative for visual disturbance.  Respiratory: Positive for shortness of breath. Negative for cough and chest tightness.   Gastrointestinal: Positive for diarrhea and abdominal distention. Negative for nausea and abdominal pain.  Genitourinary: Negative for frequency, difficulty urinating and vaginal pain.  Musculoskeletal: Negative for back pain and gait problem.  Skin: Negative for pallor and rash.  Neurological: Negative for dizziness, tremors, weakness, numbness and headaches.  Psychiatric/Behavioral: Negative for confusion and sleep disturbance.       Objective:   Physical Exam  Constitutional: She appears well-developed. No distress.  HENT:  Head: Normocephalic.  Right Ear: External ear normal.  Left Ear: External ear normal.  Nose: Nose normal.  Mouth/Throat: Oropharynx is clear and moist.  Eyes: Conjunctivae are normal. Pupils are equal, round, and reactive to light. Right eye exhibits no discharge. Left eye exhibits no discharge.  Neck: Normal range of motion. Neck supple. No JVD present. No tracheal deviation present. No thyromegaly present.  Cardiovascular: Normal rate, regular rhythm and normal heart sounds.   Pulmonary/Chest: No stridor. No respiratory distress. She has no wheezes.  Abdominal: Soft. Bowel sounds are normal. She exhibits distension. She exhibits no mass. There is no  tenderness. There is no rebound and no guarding.  Musculoskeletal: She exhibits no edema and no tenderness.  Lymphadenopathy:    She has no cervical adenopathy.  Neurological: She displays normal reflexes. No cranial nerve deficit. She exhibits normal muscle tone. Coordination normal.  Skin: No rash noted. No erythema.  Psychiatric: She has a normal mood and affect. Her behavior is normal. Judgment and thought content normal.   Lab Results  Component Value Date   WBC 10.2 08/30/2010   HGB 11.9* 08/30/2010   HCT 34.9* 08/30/2010   PLT 357.0 08/30/2010   GLUCOSE 97 10/04/2011   CHOL 170 08/30/2010   TRIG 105.0 08/30/2010   HDL 68.00 08/30/2010   LDLDIRECT 108.0 01/16/2010   LDLCALC 81 08/30/2010   ALT 45* 06/24/2011   AST 40* 06/24/2011   NA 138 10/04/2011   K 4.5 10/04/2011   CL 103 10/04/2011   CREATININE 0.9 10/04/2011   BUN 16 10/04/2011   CO2 27 10/04/2011   TSH 3.30 08/30/2010   INR 1.0 04/25/2008   HGBA1C 7.5* 10/04/2011   MICROALBUR 1.0 08/30/2010   CT  CA125 2050.7       Assessment & Plan:

## 2011-10-22 ENCOUNTER — Ambulatory Visit: Payer: Medicare Other | Attending: Gynecologic Oncology | Admitting: Gynecologic Oncology

## 2011-10-22 ENCOUNTER — Encounter: Payer: Self-pay | Admitting: Gynecologic Oncology

## 2011-10-22 VITALS — BP 110/54 | HR 78 | Temp 98.0°F | Resp 22 | Ht 64.84 in | Wt 136.0 lb

## 2011-10-22 DIAGNOSIS — R1900 Intra-abdominal and pelvic swelling, mass and lump, unspecified site: Secondary | ICD-10-CM

## 2011-10-22 DIAGNOSIS — Z853 Personal history of malignant neoplasm of breast: Secondary | ICD-10-CM | POA: Insufficient documentation

## 2011-10-22 DIAGNOSIS — R971 Elevated cancer antigen 125 [CA 125]: Secondary | ICD-10-CM | POA: Insufficient documentation

## 2011-10-22 DIAGNOSIS — Z7982 Long term (current) use of aspirin: Secondary | ICD-10-CM | POA: Insufficient documentation

## 2011-10-22 DIAGNOSIS — F17200 Nicotine dependence, unspecified, uncomplicated: Secondary | ICD-10-CM | POA: Insufficient documentation

## 2011-10-22 DIAGNOSIS — K219 Gastro-esophageal reflux disease without esophagitis: Secondary | ICD-10-CM | POA: Insufficient documentation

## 2011-10-22 DIAGNOSIS — E119 Type 2 diabetes mellitus without complications: Secondary | ICD-10-CM | POA: Insufficient documentation

## 2011-10-22 DIAGNOSIS — Z800 Family history of malignant neoplasm of digestive organs: Secondary | ICD-10-CM | POA: Insufficient documentation

## 2011-10-22 DIAGNOSIS — J4489 Other specified chronic obstructive pulmonary disease: Secondary | ICD-10-CM | POA: Insufficient documentation

## 2011-10-22 DIAGNOSIS — R180 Malignant ascites: Secondary | ICD-10-CM | POA: Insufficient documentation

## 2011-10-22 DIAGNOSIS — C801 Malignant (primary) neoplasm, unspecified: Secondary | ICD-10-CM | POA: Insufficient documentation

## 2011-10-22 DIAGNOSIS — J449 Chronic obstructive pulmonary disease, unspecified: Secondary | ICD-10-CM | POA: Insufficient documentation

## 2011-10-22 DIAGNOSIS — Z79899 Other long term (current) drug therapy: Secondary | ICD-10-CM | POA: Insufficient documentation

## 2011-10-22 DIAGNOSIS — Z8049 Family history of malignant neoplasm of other genital organs: Secondary | ICD-10-CM | POA: Insufficient documentation

## 2011-10-22 NOTE — Progress Notes (Signed)
Consult Note: Gyn-Onc   Consult was requested by Dr. Plotnikov  for the evaluation of [name] [name] CC:  Malignant ascites. Chief Complaint  Patient presents with  . Pelvic Mass    New pt    HPI: gravida 2 para 2 last normal menstrual period the age of 50. [name] [name] history is notable for breast cancer diagnosed in 1992 treated with a left radical mastectomy and subsequent methotrexate 5-FU and leucovorin. [name] [name] has been amenorrheic since.  In December 2012 she noted abdominal bloating that worsened. She reports feeling an increase in the size of her abdomen and intermittent episodes of diarrhea and loose stool. This worsened and she was evaluated by her primary care practitioner. A CT scan of the abdomen and pelvis was performed on 10/15/2011. This was notable for the absence of evidence of a bowel obstruction. There was a  moderate amount of abdominal pelvic ascites. Omental caking was appreciated with the presence of a 6.6 cm omental implant. Peritoneal implants were noted in addition to a periumbilical hernia and a 9 mm subcutaneous implant. An ultrasound-guided paracentesis was performed on 10/18/2011. The pathology returned  PERITONEAL/ ASCITIC FLUID: MALIGNANT CELLS CONSISTENT WITH METASTATIC ADENOCARCINOMA. SEE COMMENT. COMMENT: IMMUNOHISTOCHEMICAL STAINING IS PERFORMED. THE CARCINOMA IS POSITIVE FOR CYTOKERATIN 7 AND WT-1. THERE IS EQUIVOCAL STAINING FOR ESTROGEN RECEPTOR. CYTOKERATIN 20, CALRETININ, AND CDX-2 ARE ALL NEGATIVE. THE STAINING PATTERN IS HIGHLY SUGGESTIVE OF A PRIMARY GYNECOLOGIC ORIGIN.  ACA 125 returned with a value of 2050.7.  Her family history is notable for mother diagnosed with synchronous endometrial and colon cancer the age of 68. A brother was diagnosed with colon cancer in his mid-50s. The patient underwent colonoscopy 2 years ago with removal of 2 benign polyps. She denies any awareness of genetic testing in any of the members of her  family.   Review of Systems:  Constitutional  Feels well, poor appetite 3 pounds weight loss since December of 2012 Skin/Breast  No rash, sores, jaundice, itching, dryness Cardiovascular  No chest pain, shortness of breath, or edema  Pulmonary  No cough or wheeze. Reports 3 pillow orthopnea Gastro Intestinal  No nausea, vomit ting.  Reports intermittent soft stool and diarrhea.   No bright red blood per rectum, no abdominal pain, change in bowel movement, or constipation. Reports increase in ascites since the last paracentesis Genito Urinary  No frequency, urgency, dysuria, no vaginal bleeding or discharge. Musculo Skeletal  No myalgia, arthralgia, joint swelling or pain  Neurologic  No weakness, numbness, change in gait,  Psychology  No depression, anxiety, insomnia.    Current Meds:  Outpatient Encounter Prescriptions as of 10/22/2011  Medication Sig Dispense Refill  . albuterol-ipratropium (COMBIVENT) 18-103 MCG/ACT inhaler Inhale 2 puffs into the lungs 4 (four) times daily.        . ALPRAZolam (XANAX) 0.5 MG tablet Take 1 tablet (0.5 mg total) by mouth 2 (two) times daily.  60 tablet  5  . aspirin 81 MG tablet Take 81 mg by mouth daily.        . atenolol (TENORMIN) 50 MG tablet Take 0.5 tablets (25 mg total) by mouth 2 (two) times daily.  90 tablet  1  . b complex vitamins tablet Take 1 tablet by mouth daily.        . budesonide-formoterol (SYMBICORT) 160-4.5 MCG/ACT inhaler Inhale 2 puffs into the lungs 2 (two) times daily.  1 Inhaler  5  . buPROPion (WELLBUTRIN SR) 150 MG 12 hr tablet   Take 1 tablet (150 mg total) by mouth 2 (two) times daily.  180 tablet  2  . busPIRone (BUSPAR) 15 MG tablet Take 1 tablet (15 mg total) by mouth 2 (two) times daily.  180 tablet  1  . cholecalciferol (VITAMIN D) 1000 UNITS tablet Take 1,000 Units by mouth daily.        . glucose blood (ONE TOUCH TEST STRIPS) test strip Use as instructed  100 each  3  . ipratropium-albuterol (DUONEB) 0.5-2.5  (3) MG/3ML SOLN INHALE 1 VIAL VIA HAND HELD NEBULIZER 4 TIMES A DAY  360 mL  3  . lovastatin (MEVACOR) 40 MG tablet Take 1 tablet (40 mg total) by mouth at bedtime.  90 tablet  2  . metFORMIN (GLUCOPHAGE) 1000 MG tablet Take 1 tablet (1,000 mg total) by mouth 2 (two) times daily with a meal.  180 tablet  3  . omeprazole (PRILOSEC) 40 MG capsule Take 1 capsule (40 mg total) by mouth daily.  90 capsule  3  . repaglinide (PRANDIN) 2 MG tablet Take 2 mg by mouth 3 (three) times daily as needed.       . sertraline (ZOLOFT) 100 MG tablet Take 1 tablet (100 mg total) by mouth daily.  90 tablet  1  . Diclofenac Sodium (PENNSAID) 1.5 % SOLN Place 3-5 drops onto the skin 3 (three) times daily as needed.        . ranitidine (ZANTAC) 150 MG tablet Take 1 tablet (150 mg total) by mouth 2 (two) times daily.  180 tablet  3  . roflumilast (DALIRESP) 500 MCG TABS tablet Take 500 mcg by mouth daily.          Allergy:  Allergies  Allergen Reactions  . Varenicline Tartrate     Social Hx:   History   Social History  . Marital Status: Married    Spouse Name: N/A    Number of Children: N/A  . Years of Education: N/A   Occupational History  . Not on file.   Social History Main Topics  . Smoking status: Current Everyday Smoker -- 1.0 packs/day    Types: Cigarettes  . Smokeless tobacco: Not on file   Comment: quit again 2009; restarted 1 ppd 2011  . Alcohol Use: No  . Drug Use: No  . Sexually Active: Not Currently   Other Topics Concern  . Not on file   Social History Narrative  . No narrative on file    Past Surgical Hx:  Past Surgical History  Procedure Date  . Cesarean section 1980, 1982    X 2   . Laparoscopy     work up for infertility  . Tympanoplasty     left X 2  . Left breast fibroadenoma removal 1960's  . Breast cyst aspirations   . Left modified radial mastectomy     Past Medical Hx:  Past Medical History  Diagnosis Date  . History of breast cancer   . Cancer      breast  . COPD (chronic obstructive pulmonary disease)   . GERD (gastroesophageal reflux disease)   . Diabetes mellitus     type II  . Hx of colonic polyps   . Osteoarthritis     Past Gynecological History:  Gravida 2 para 2 menarche occurred at age of 16 with regular menses until menopause was chemotherapy induced to the age of 16. 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  Used oral contraceptive pills for a short while.  Family Hx:    Family History  Problem Relation Age of Onset  . Cancer Mother     endometrial/ colon  . Cancer Brother     colon  . COPD Other   . Hypertension Other     Vitals:  Blood pressure 110/54, pulse 78, temperature 98 F (36.7 C), resp. rate 22, height 5' 4.84" (1.647 m), weight 61.689 kg (136 lb).  Physical Exam: WD in NAD Neck  Supple NROM, without any enlargements.  Lymph Node Survey No cervical supraclavicular or inguinal adenopathy Cardiovascular  Pulse normal rate, regularity and rhythm. S1 and S2 normal.  Lungs  Clear to auscultation bilateraly, distant breath sounds without wheezes/crackles/rhonchi.  Skin  No rash/lesions/breakdown  Psychiatry  Alert and oriented to person, place, and time  Abdomen  Normoactive bowel sounds, abdomen soft, non-tender.  Umbilical hernia noted no palpable masses.  Evidence of ascites. Omental cake palpable.  Back No CVA tenderness Genito Urinary  Vulva/vagina: Normal external [name] genitalia.  No lesions. No discharge or bleeding.  Bladder/urethra:  No lesions or masses  Vagina: Atrophic without any lesions or discharge  Cervix: Normal 1.5 cm appearing, no lesions.  Uterus: Small, mobile,   Adnexa: Palpable pelvic mass is noted.  Rectal  Good tone, nodularity within the cul-de-sac palpable pelvic mass  Extremities  No bilateral cyanosis, clubbing or edema.   Assessment/Plan:  [name] [name]  is a 70 y.o.  year old with a history of breast cancer treated approximately 20 years ago who now presents  with malignant ascites and CA 125 elevated to 2050.7 imaging is notable for the presence of an omental cake ascites no note is made of a pelvic mass. However on pelvic examination cul-de-sac nodularity is appreciated in addition to a pelvic mass.  The patient was consult that the recommendation would be exploratory laparotomy with staging  Procedure is  inclusive of hysterectomy bilateral salpingo-oophorectomy omentectomy, and radical debulking to achieve the minimum residual disease. The patient is aware that this may require a rectosigmoid resection.  She is aware to consideration place for placement of an intraperitoneal catheter but this would be dependent upon the surgical findings and the surgical procedure performed. She is also aware that chemotherapy will be administered after surgical debulking . The risks of the procedure discussed with the patient and her husband were that of infection bleeding damage to surrounding structures prolonged hospitalization and reoperation. [name] [name] is aware that Dr. Paula Gehrig will be her surgeon that the procedure will take place on October 01 2011.  All of her questions were answered to her satisfaction.  Consideration will be given to testing the tumor for microsatellite instability.    Madelena Maturin, MD, PhD 10/22/2011, 12:25 PM        

## 2011-10-22 NOTE — Patient Instructions (Signed)
Exploratory laparotomy hysterectomy and debulking 10/29/2011 by Dr. Cleda Mccreedy and Dr. Andria Rhein enema and one bottle of mag citrate the day prior to surgery

## 2011-10-24 ENCOUNTER — Encounter (HOSPITAL_COMMUNITY)
Admission: RE | Admit: 2011-10-24 | Discharge: 2011-10-24 | Disposition: A | Discharge status: Home / Self Care | Payer: Medicare Other | Source: Ambulatory Visit | Attending: Gynecologic Oncology | Admitting: Gynecologic Oncology

## 2011-10-24 ENCOUNTER — Ambulatory Visit (HOSPITAL_COMMUNITY)
Admission: RE | Admit: 2011-10-24 | Discharge: 2011-10-24 | Disposition: A | Discharge status: Home / Self Care | Payer: Medicare Other | Source: Ambulatory Visit | Attending: Gynecologic Oncology | Admitting: Gynecologic Oncology

## 2011-10-24 ENCOUNTER — Encounter (HOSPITAL_COMMUNITY): Payer: Self-pay

## 2011-10-24 ENCOUNTER — Other Ambulatory Visit: Payer: Self-pay

## 2011-10-24 ENCOUNTER — Encounter (HOSPITAL_COMMUNITY): Payer: Self-pay | Admitting: Pharmacy Technician

## 2011-10-24 DIAGNOSIS — E119 Type 2 diabetes mellitus without complications: Secondary | ICD-10-CM | POA: Insufficient documentation

## 2011-10-24 DIAGNOSIS — C569 Malignant neoplasm of unspecified ovary: Secondary | ICD-10-CM | POA: Insufficient documentation

## 2011-10-24 DIAGNOSIS — Z01812 Encounter for preprocedural laboratory examination: Secondary | ICD-10-CM | POA: Insufficient documentation

## 2011-10-24 DIAGNOSIS — Z01810 Encounter for preprocedural cardiovascular examination: Secondary | ICD-10-CM | POA: Insufficient documentation

## 2011-10-24 DIAGNOSIS — J4489 Other specified chronic obstructive pulmonary disease: Secondary | ICD-10-CM | POA: Insufficient documentation

## 2011-10-24 DIAGNOSIS — Z79899 Other long term (current) drug therapy: Secondary | ICD-10-CM | POA: Insufficient documentation

## 2011-10-24 DIAGNOSIS — K219 Gastro-esophageal reflux disease without esophagitis: Secondary | ICD-10-CM | POA: Insufficient documentation

## 2011-10-24 DIAGNOSIS — J449 Chronic obstructive pulmonary disease, unspecified: Secondary | ICD-10-CM | POA: Insufficient documentation

## 2011-10-24 DIAGNOSIS — I10 Essential (primary) hypertension: Secondary | ICD-10-CM | POA: Insufficient documentation

## 2011-10-24 DIAGNOSIS — Z01818 Encounter for other preprocedural examination: Secondary | ICD-10-CM | POA: Insufficient documentation

## 2011-10-24 HISTORY — DX: Shortness of breath: R06.02

## 2011-10-24 HISTORY — DX: Nonrheumatic mitral (valve) prolapse: I34.1

## 2011-10-24 HISTORY — DX: Anxiety disorder, unspecified: F41.9

## 2011-10-24 HISTORY — DX: Cardiac arrhythmia, unspecified: I49.9

## 2011-10-24 HISTORY — DX: Depression, unspecified: F32.A

## 2011-10-24 HISTORY — DX: Cardiac murmur, unspecified: R01.1

## 2011-10-24 HISTORY — DX: Major depressive disorder, single episode, unspecified: F32.9

## 2011-10-24 LAB — COMPREHENSIVE METABOLIC PANEL
ALT: 25 U/L (ref 0–35)
Alkaline Phosphatase: 81 U/L (ref 39–117)
BUN: 12 mg/dL (ref 6–23)
CO2: 27 mEq/L (ref 19–32)
GFR calc Af Amer: >90 mL/min (ref >90)
GFR calc non Af Amer: 87 mL/min — ABNORMAL LOW (ref >90)
Glucose, Bld: 130 mg/dL — ABNORMAL HIGH (ref 70–99)
Potassium: 4.3 mEq/L (ref 3.5–5.1)
Sodium: 138 mEq/L (ref 135–145)
Total Bilirubin: 0.1 mg/dL — ABNORMAL LOW (ref 0.3–1.2)

## 2011-10-24 LAB — CBC
HCT: 35.8 % — ABNORMAL LOW (ref 36.0–46.0)
Hemoglobin: 11.8 g/dL — ABNORMAL LOW (ref 12.0–15.0)
MCHC: 33 g/dL (ref 30.0–36.0)
RBC: 4.2 MIL/uL (ref 3.87–5.11)

## 2011-10-24 LAB — DIFFERENTIAL
Basophils Absolute: 0.1 10*3/uL (ref 0.0–0.1)
Basophils Relative: 1 % (ref 0–1)
Eosinophils Absolute: 0.2 10*3/uL (ref 0.0–0.7)
Eosinophils Relative: 2 % (ref 0–5)
Lymphs Abs: 2.4 10*3/uL (ref 0.7–4.0)
Neutrophils Relative %: 69 % (ref 43–77)

## 2011-10-24 LAB — SURGICAL PCR SCREEN
MRSA, PCR: INVALID — AB
Staphylococcus aureus: INVALID — AB

## 2011-10-24 NOTE — Pre-Procedure Instructions (Deleted)
10/24/11- Last ECHO 2009 in EPIC- this lists mitral valve as Mild regurgitation in report

## 2011-10-24 NOTE — Pre-Procedure Instructions (Signed)
10/24/11 ECHO in [facility] 2009 - lists trivial mitral regurgitation  In report.  Pt reported on preop history of 10/24/11- Mitral Valve Prolapse.

## 2011-10-24 NOTE — Pre-Procedure Instructions (Signed)
10/24/11 Pt aware nasal PCR sent out to main lab. Will call pt on Monday . Pt is aware to keep script until Monday. Nurse will let her be aware if positive.

## 2011-10-24 NOTE — Patient Instructions (Signed)
[address]  10/24/2011   Your procedure is scheduled on:  10/29/11 1030am-210pm  Report to [facility] at 0800 AM.  Call this number if you have problems the morning of surgery: [phone]   Remember:   Do not eat food:After Midnight.  May have clear liquids:until Midnight .    Take these medicines the morning of surgery with A SIP OF WATER:    Do not wear jewelry, make-up or nail polish.  Do not wear lotions, powders, or perfumes.   Do not shave 48 hours prior to surgery.  Do not bring valuables to the hospital.  Contacts, dentures or bridgework may not be worn into surgery.  Leave suitcase in the car. After surgery it may be brought to your room.  For patients admitted to the hospital, checkout time is 11:00 AM the day of discharge.   Special Instructions: CHG Shower Use Special Wash: 1/2 bottle night before surgery and 1/2 bottle morning of surgery. Shower chin to toes with CHG.  Wash face and private parts with regular soap.    Please read over the following fact sheets that you were given: MRSA Information, Incentive Spirometry Fact Sheet, Blood Transfusion Fact Sheet, coughing and deep breathing exercises, leg exercises

## 2011-10-27 LAB — MRSA CULTURE

## 2011-10-28 ENCOUNTER — Other Ambulatory Visit: Payer: Self-pay | Admitting: *Deleted

## 2011-10-28 MED ORDER — IPRATROPIUM-ALBUTEROL 18-103 MCG/ACT IN AERO: 2.0000 | INHALATION_SPRAY | RESPIRATORY_TRACT | Status: DC | PRN

## 2011-10-29 ENCOUNTER — Encounter (HOSPITAL_COMMUNITY): Payer: Self-pay | Admitting: Anesthesiology

## 2011-10-29 ENCOUNTER — Ambulatory Visit (HOSPITAL_COMMUNITY): Payer: Medicare Other | Admitting: Anesthesiology

## 2011-10-29 ENCOUNTER — Inpatient Hospital Stay (HOSPITAL_COMMUNITY)
Admission: RE | Admit: 2011-10-29 | Discharge: 2011-11-03 | DRG: 737 | Disposition: A | Discharge status: Home / Self Care | Payer: Medicare Other | Source: Ambulatory Visit | Attending: Obstetrics & Gynecology | Admitting: Obstetrics & Gynecology

## 2011-10-29 ENCOUNTER — Encounter (HOSPITAL_COMMUNITY): Payer: Self-pay | Admitting: *Deleted

## 2011-10-29 ENCOUNTER — Encounter (HOSPITAL_COMMUNITY)
Admission: RE | Disposition: A | Discharge status: Home / Self Care | Payer: Self-pay | Source: Ambulatory Visit | Attending: Obstetrics & Gynecology

## 2011-10-29 DIAGNOSIS — F17200 Nicotine dependence, unspecified, uncomplicated: Secondary | ICD-10-CM

## 2011-10-29 DIAGNOSIS — Z01818 Encounter for other preprocedural examination: Secondary | ICD-10-CM

## 2011-10-29 DIAGNOSIS — R109 Unspecified abdominal pain: Secondary | ICD-10-CM

## 2011-10-29 DIAGNOSIS — K219 Gastro-esophageal reflux disease without esophagitis: Secondary | ICD-10-CM | POA: Diagnosis present

## 2011-10-29 DIAGNOSIS — E119 Type 2 diabetes mellitus without complications: Secondary | ICD-10-CM

## 2011-10-29 DIAGNOSIS — Z860100 Personal history of colon polyps, unspecified: Secondary | ICD-10-CM

## 2011-10-29 DIAGNOSIS — M1990 Unspecified osteoarthritis, unspecified site: Secondary | ICD-10-CM | POA: Diagnosis present

## 2011-10-29 DIAGNOSIS — R1900 Intra-abdominal and pelvic swelling, mass and lump, unspecified site: Secondary | ICD-10-CM

## 2011-10-29 DIAGNOSIS — R1031 Right lower quadrant pain: Secondary | ICD-10-CM

## 2011-10-29 DIAGNOSIS — R971 Elevated cancer antigen 125 [CA 125]: Secondary | ICD-10-CM | POA: Diagnosis present

## 2011-10-29 DIAGNOSIS — C569 Malignant neoplasm of unspecified ovary: Principal | ICD-10-CM | POA: Diagnosis present

## 2011-10-29 DIAGNOSIS — J310 Chronic rhinitis: Secondary | ICD-10-CM

## 2011-10-29 DIAGNOSIS — Z79899 Other long term (current) drug therapy: Secondary | ICD-10-CM

## 2011-10-29 DIAGNOSIS — Z01812 Encounter for preprocedural laboratory examination: Secondary | ICD-10-CM

## 2011-10-29 DIAGNOSIS — R180 Malignant ascites: Secondary | ICD-10-CM | POA: Diagnosis present

## 2011-10-29 DIAGNOSIS — I059 Rheumatic mitral valve disease, unspecified: Secondary | ICD-10-CM

## 2011-10-29 DIAGNOSIS — Z8049 Family history of malignant neoplasm of other genital organs: Secondary | ICD-10-CM

## 2011-10-29 DIAGNOSIS — K8020 Calculus of gallbladder without cholecystitis without obstruction: Secondary | ICD-10-CM

## 2011-10-29 DIAGNOSIS — E785 Hyperlipidemia, unspecified: Secondary | ICD-10-CM

## 2011-10-29 DIAGNOSIS — R0989 Other specified symptoms and signs involving the circulatory and respiratory systems: Secondary | ICD-10-CM

## 2011-10-29 DIAGNOSIS — D72829 Elevated white blood cell count, unspecified: Secondary | ICD-10-CM | POA: Diagnosis present

## 2011-10-29 DIAGNOSIS — J4489 Other specified chronic obstructive pulmonary disease: Secondary | ICD-10-CM | POA: Diagnosis present

## 2011-10-29 DIAGNOSIS — J449 Chronic obstructive pulmonary disease, unspecified: Secondary | ICD-10-CM

## 2011-10-29 DIAGNOSIS — C786 Secondary malignant neoplasm of retroperitoneum and peritoneum: Secondary | ICD-10-CM | POA: Diagnosis present

## 2011-10-29 DIAGNOSIS — J209 Acute bronchitis, unspecified: Secondary | ICD-10-CM

## 2011-10-29 DIAGNOSIS — R140 Abdominal distension (gaseous): Secondary | ICD-10-CM

## 2011-10-29 DIAGNOSIS — K429 Umbilical hernia without obstruction or gangrene: Secondary | ICD-10-CM | POA: Diagnosis present

## 2011-10-29 DIAGNOSIS — Z853 Personal history of malignant neoplasm of breast: Secondary | ICD-10-CM

## 2011-10-29 DIAGNOSIS — Z800 Family history of malignant neoplasm of digestive organs: Secondary | ICD-10-CM

## 2011-10-29 DIAGNOSIS — K668 Other specified disorders of peritoneum: Secondary | ICD-10-CM

## 2011-10-29 DIAGNOSIS — R05 Cough: Secondary | ICD-10-CM

## 2011-10-29 DIAGNOSIS — R0602 Shortness of breath: Secondary | ICD-10-CM

## 2011-10-29 DIAGNOSIS — Z7982 Long term (current) use of aspirin: Secondary | ICD-10-CM

## 2011-10-29 DIAGNOSIS — Z9010 Acquired absence of unspecified breast and nipple: Secondary | ICD-10-CM

## 2011-10-29 DIAGNOSIS — F329 Major depressive disorder, single episode, unspecified: Secondary | ICD-10-CM

## 2011-10-29 DIAGNOSIS — I10 Essential (primary) hypertension: Secondary | ICD-10-CM | POA: Diagnosis present

## 2011-10-29 HISTORY — PX: LAPAROTOMY: SHX154

## 2011-10-29 HISTORY — PX: ABDOMINAL HYSTERECTOMY: SHX81

## 2011-10-29 HISTORY — PX: SALPINGOOPHORECTOMY: SHX82

## 2011-10-29 LAB — GLUCOSE, CAPILLARY
Glucose-Capillary: 108 mg/dL — ABNORMAL HIGH (ref 70–99)
Glucose-Capillary: 299 mg/dL — ABNORMAL HIGH (ref 70–99)

## 2011-10-29 LAB — TYPE AND SCREEN: ABO/RH(D): B POS

## 2011-10-29 SURGERY — LAPAROTOMY, EXPLORATORY
Anesthesia: General | Wound class: Clean Contaminated

## 2011-10-29 MED ORDER — OXYCODONE-ACETAMINOPHEN 5-325 MG PO TABS
1.0000 | ORAL_TABLET | ORAL | Status: DC | PRN
  Administered 2011-10-31 – 2011-11-03 (×12): 2 via ORAL
  Filled 2011-10-29 (×12): qty 2

## 2011-10-29 MED ORDER — IPRATROPIUM BROMIDE 0.02 % IN SOLN
0.5000 mg | RESPIRATORY_TRACT | Status: DC | PRN
  Administered 2011-10-29 – 2011-10-31 (×3): 0.5 mg via RESPIRATORY_TRACT
  Filled 2011-10-29 (×8): qty 2.5

## 2011-10-29 MED ORDER — ZOLPIDEM TARTRATE 5 MG PO TABS: 5.0000 mg | ORAL_TABLET | Freq: Every evening | ORAL | Status: DC | PRN

## 2011-10-29 MED ORDER — ONDANSETRON HCL 4 MG PO TABS: 4.0000 mg | ORAL_TABLET | Freq: Four times a day (QID) | ORAL | Status: DC | PRN

## 2011-10-29 MED ORDER — NALOXONE HCL 0.4 MG/ML IJ SOLN: 0.4000 mg | INTRAMUSCULAR | Status: DC | PRN

## 2011-10-29 MED ORDER — NEOSTIGMINE METHYLSULFATE 1 MG/ML IJ SOLN
INTRAMUSCULAR | Status: DC | PRN
  Administered 2011-10-29: 3 mg via INTRAVENOUS

## 2011-10-29 MED ORDER — GLYCOPYRROLATE 0.2 MG/ML IJ SOLN
INTRAMUSCULAR | Status: DC | PRN
  Administered 2011-10-29: 0.4 mg via INTRAVENOUS

## 2011-10-29 MED ORDER — PROPOFOL 10 MG/ML IV EMUL
INTRAVENOUS | Status: DC | PRN
  Administered 2011-10-29: 170 mg via INTRAVENOUS

## 2011-10-29 MED ORDER — LACTATED RINGERS IV SOLN
INTRAVENOUS | Status: DC
  Administered 2011-10-29: 1000 mL via INTRAVENOUS

## 2011-10-29 MED ORDER — KCL IN DEXTROSE-NACL 20-5-0.45 MEQ/L-%-% IV SOLN
INTRAVENOUS | Status: DC
  Administered 2011-10-30 – 2011-10-31 (×3): via INTRAVENOUS
  Filled 2011-10-29 (×7): qty 1000

## 2011-10-29 MED ORDER — DIPHENHYDRAMINE HCL 12.5 MG/5ML PO ELIX
12.5000 mg | ORAL_SOLUTION | Freq: Four times a day (QID) | ORAL | Status: DC | PRN
  Administered 2011-10-30: 12.5 mg via ORAL
  Filled 2011-10-29: qty 5

## 2011-10-29 MED ORDER — MIDAZOLAM HCL 5 MG/5ML IJ SOLN
INTRAMUSCULAR | Status: DC | PRN
  Administered 2011-10-29 (×2): 1 mg via INTRAVENOUS

## 2011-10-29 MED ORDER — DEXTROSE 5 % IV SOLN
1.0000 g | INTRAVENOUS | Status: DC
  Filled 2011-10-29: qty 1

## 2011-10-29 MED ORDER — ONDANSETRON HCL 4 MG/2ML IJ SOLN
4.0000 mg | Freq: Four times a day (QID) | INTRAMUSCULAR | Status: DC | PRN
  Filled 2011-10-29 (×2): qty 2

## 2011-10-29 MED ORDER — DEXTROSE 5 % IV SOLN
1.0000 g | INTRAVENOUS | Status: DC | PRN
  Administered 2011-10-29: 1 g via INTRAVENOUS

## 2011-10-29 MED ORDER — IPRATROPIUM-ALBUTEROL 18-103 MCG/ACT IN AERO
2.0000 | INHALATION_SPRAY | RESPIRATORY_TRACT | Status: DC | PRN
  Filled 2011-10-29: qty 14.7

## 2011-10-29 MED ORDER — ONDANSETRON HCL 4 MG/2ML IJ SOLN
4.0000 mg | Freq: Four times a day (QID) | INTRAMUSCULAR | Status: DC | PRN
  Administered 2011-10-29 – 2011-10-30 (×2): 4 mg via INTRAVENOUS

## 2011-10-29 MED ORDER — LIDOCAINE HCL (CARDIAC) 20 MG/ML IV SOLN
INTRAVENOUS | Status: DC | PRN
  Administered 2011-10-29: 75 mg via INTRAVENOUS

## 2011-10-29 MED ORDER — HYDROMORPHONE HCL PF 1 MG/ML IJ SOLN
INTRAMUSCULAR | Status: AC
  Filled 2011-10-29: qty 1

## 2011-10-29 MED ORDER — HEPARIN SOD (PORK) LOCK FLUSH 100 UNIT/ML IV SOLN
INTRAVENOUS | Status: AC
  Filled 2011-10-29: qty 5

## 2011-10-29 MED ORDER — ENOXAPARIN SODIUM 40 MG/0.4ML ~~LOC~~ SOLN
SUBCUTANEOUS | Status: AC
  Administered 2011-10-29: 40 mg via SUBCUTANEOUS
  Filled 2011-10-29: qty 0.4

## 2011-10-29 MED ORDER — PROMETHAZINE HCL 25 MG/ML IJ SOLN: 6.2500 mg | INTRAMUSCULAR | Status: DC | PRN

## 2011-10-29 MED ORDER — FENTANYL CITRATE 0.05 MG/ML IJ SOLN
INTRAMUSCULAR | Status: DC | PRN
  Administered 2011-10-29: 100 ug via INTRAVENOUS
  Administered 2011-10-29 (×2): 50 ug via INTRAVENOUS
  Administered 2011-10-29: 100 ug via INTRAVENOUS
  Administered 2011-10-29 (×2): 50 ug via INTRAVENOUS

## 2011-10-29 MED ORDER — ACETAMINOPHEN 10 MG/ML IV SOLN
INTRAVENOUS | Status: AC
  Filled 2011-10-29: qty 100

## 2011-10-29 MED ORDER — INSULIN ASPART 100 UNIT/ML ~~LOC~~ SOLN
0.0000 [IU] | SUBCUTANEOUS | Status: DC
  Administered 2011-10-29 (×2): 5 [IU] via SUBCUTANEOUS
  Administered 2011-10-30 (×2): 3 [IU] via SUBCUTANEOUS
  Administered 2011-10-30: 1 [IU] via SUBCUTANEOUS
  Administered 2011-10-30 (×3): 2 [IU] via SUBCUTANEOUS
  Administered 2011-10-31 (×3): 1 [IU] via SUBCUTANEOUS

## 2011-10-29 MED ORDER — ENOXAPARIN SODIUM 40 MG/0.4ML ~~LOC~~ SOLN: 40.0000 mg | SUBCUTANEOUS | Status: DC

## 2011-10-29 MED ORDER — CISATRACURIUM BESYLATE 2 MG/ML IV SOLN
INTRAVENOUS | Status: DC | PRN
  Administered 2011-10-29: 4 mg via INTRAVENOUS
  Administered 2011-10-29: 2 mg via INTRAVENOUS
  Administered 2011-10-29: 8 mg via INTRAVENOUS
  Administered 2011-10-29: 2 mg via INTRAVENOUS

## 2011-10-29 MED ORDER — DEXAMETHASONE SODIUM PHOSPHATE 10 MG/ML IJ SOLN
INTRAMUSCULAR | Status: DC | PRN
  Administered 2011-10-29: 10 mg via INTRAVENOUS

## 2011-10-29 MED ORDER — BUPIVACAINE LIPOSOME 1.3 % IJ SUSP
INTRAMUSCULAR | Status: DC | PRN
  Administered 2011-10-29: 50 mL

## 2011-10-29 MED ORDER — DIPHENHYDRAMINE HCL 50 MG/ML IJ SOLN
12.5000 mg | Freq: Four times a day (QID) | INTRAMUSCULAR | Status: DC | PRN
  Administered 2011-10-30: 12.5 mg via INTRAVENOUS
  Filled 2011-10-29: qty 1

## 2011-10-29 MED ORDER — ALBUTEROL SULFATE (5 MG/ML) 0.5% IN NEBU
2.5000 mg | INHALATION_SOLUTION | RESPIRATORY_TRACT | Status: DC | PRN
  Administered 2011-10-29 – 2011-10-31 (×3): 2.5 mg via RESPIRATORY_TRACT
  Filled 2011-10-29 (×7): qty 0.5

## 2011-10-29 MED ORDER — ACETAMINOPHEN 10 MG/ML IV SOLN
INTRAVENOUS | Status: DC | PRN
  Administered 2011-10-29: 1000 mg via INTRAVENOUS

## 2011-10-29 MED ORDER — HYDROMORPHONE 0.3 MG/ML IV SOLN
INTRAVENOUS | Status: DC
  Administered 2011-10-29 (×2): 0.2 mg via INTRAVENOUS
  Administered 2011-10-29: 10 mL via INTRAVENOUS
  Administered 2011-10-30: 20:00:00 via INTRAVENOUS
  Administered 2011-10-30: 3 mL via INTRAVENOUS
  Administered 2011-10-30: 2.19 mg via INTRAVENOUS
  Administered 2011-10-30: 2 mL via INTRAVENOUS
  Administered 2011-10-31: 1.39 mg via INTRAVENOUS
  Administered 2011-10-31: 2.19 mg via INTRAVENOUS
  Administered 2011-10-31: 1019 mg via INTRAVENOUS

## 2011-10-29 MED ORDER — ONDANSETRON HCL 4 MG/2ML IJ SOLN
INTRAMUSCULAR | Status: DC | PRN
  Administered 2011-10-29 (×2): 2 mg via INTRAVENOUS

## 2011-10-29 MED ORDER — 0.9 % SODIUM CHLORIDE (POUR BTL) OPTIME
TOPICAL | Status: DC | PRN
  Administered 2011-10-29: 2000 mL

## 2011-10-29 MED ORDER — KCL IN DEXTROSE-NACL 20-5-0.45 MEQ/L-%-% IV SOLN
INTRAVENOUS | Status: AC
  Filled 2011-10-29: qty 1000

## 2011-10-29 MED ORDER — EPHEDRINE SULFATE 50 MG/ML IJ SOLN
INTRAMUSCULAR | Status: DC | PRN
  Administered 2011-10-29: 5 mg via INTRAVENOUS
  Administered 2011-10-29: 10 mg via INTRAVENOUS
  Administered 2011-10-29: 7.5 mg via INTRAVENOUS

## 2011-10-29 MED ORDER — HYDROMORPHONE 0.3 MG/ML IV SOLN
INTRAVENOUS | Status: AC
  Administered 2011-10-30: 1.79 mg via INTRAVENOUS
  Filled 2011-10-29: qty 25

## 2011-10-29 MED ORDER — BUPIVACAINE LIPOSOME 1.3 % IJ SUSP
20.0000 mL | Freq: Once | INTRAMUSCULAR | Status: DC
  Filled 2011-10-29: qty 20

## 2011-10-29 MED ORDER — LACTATED RINGERS IV SOLN
INTRAVENOUS | Status: DC | PRN
  Administered 2011-10-29 (×4): via INTRAVENOUS

## 2011-10-29 MED ORDER — HYDROMORPHONE HCL PF 1 MG/ML IJ SOLN
0.2500 mg | INTRAMUSCULAR | Status: DC | PRN
  Administered 2011-10-29: 0.25 mg via INTRAVENOUS
  Administered 2011-10-29: 0.5 mg via INTRAVENOUS

## 2011-10-29 MED ORDER — SODIUM CHLORIDE 0.9 % IJ SOLN: 9.0000 mL | INTRAMUSCULAR | Status: DC | PRN

## 2011-10-29 MED ORDER — ATENOLOL 25 MG PO TABS
25.0000 mg | ORAL_TABLET | Freq: Two times a day (BID) | ORAL | Status: DC
  Administered 2011-10-30 – 2011-11-03 (×9): 25 mg via ORAL
  Filled 2011-10-29 (×11): qty 1

## 2011-10-29 MED ORDER — HYDROMORPHONE HCL PF 1 MG/ML IJ SOLN
INTRAMUSCULAR | Status: DC | PRN
  Administered 2011-10-29 (×2): 1 mg via INTRAVENOUS

## 2011-10-29 SURGICAL SUPPLY — 58 items
ATTRACTOMAT 16X20 MAGNETIC DRP (DRAPES) ×3 IMPLANT
BAG URINE DRAINAGE (UROLOGICAL SUPPLIES) IMPLANT
BLADE EXTENDED COATED 6.5IN (ELECTRODE) ×3 IMPLANT
CANISTER SUCTION 2500CC (MISCELLANEOUS) ×6 IMPLANT
CATH FOLEY 2WAY SLVR  5CC 16FR (CATHETERS)
CATH FOLEY 2WAY SLVR 5CC 16FR (CATHETERS) IMPLANT
CLIP TI MEDIUM 6 (CLIP) ×3 IMPLANT
CLIP TI MEDIUM LARGE 6 (CLIP) ×3 IMPLANT
CLOTH BEACON ORANGE TIMEOUT ST (SAFETY) ×3 IMPLANT
COVER SURGICAL LIGHT HANDLE (MISCELLANEOUS) ×3 IMPLANT
DRAPE BACK TABLE (DRAPES) ×3 IMPLANT
DRAPE TABLE BACK 44X90 PK DISP (DRAPES) IMPLANT
DRAPE UTILITY 15X26 (DRAPE) ×3 IMPLANT
DRAPE WARM FLUID 44X44 (DRAPE) ×3 IMPLANT
DRSG TELFA 4X14 ISLAND ADH (GAUZE/BANDAGES/DRESSINGS) ×3 IMPLANT
ELECT REM PT RETURN 9FT ADLT (ELECTROSURGICAL) ×3
ELECTRODE REM PT RTRN 9FT ADLT (ELECTROSURGICAL) ×2 IMPLANT
GAUZE SPONGE 4X4 16PLY XRAY LF (GAUZE/BANDAGES/DRESSINGS) IMPLANT
GLOVE BIO SURGEON STRL SZ 6.5 (GLOVE) ×3 IMPLANT
GLOVE BIO SURGEON STRL SZ7.5 (GLOVE) ×6 IMPLANT
GLOVE BIOGEL M STRL SZ7.5 (GLOVE) ×15 IMPLANT
GLOVE BIOGEL PI IND STRL 7.0 (GLOVE) ×2 IMPLANT
GLOVE BIOGEL PI INDICATOR 7.0 (GLOVE) ×1
GOWN PREVENTION PLUS XLARGE (GOWN DISPOSABLE) ×3 IMPLANT
GOWN STRL NON-REIN LRG LVL3 (GOWN DISPOSABLE) ×15 IMPLANT
GOWN STRL REIN XL XLG (GOWN DISPOSABLE) ×3 IMPLANT
HOLDER FOLEY CATH W/STRAP (MISCELLANEOUS) ×3 IMPLANT
NEEDLE HYPO 25X1 1.5 SAFETY (NEEDLE) ×3 IMPLANT
NS IRRIG 1000ML POUR BTL (IV SOLUTION) ×6 IMPLANT
PACK ABDOMINAL WL (CUSTOM PROCEDURE TRAY) ×3 IMPLANT
SHEET LAVH (DRAPES) ×3 IMPLANT
SPONGE LAP 18X18 X RAY DECT (DISPOSABLE) ×12 IMPLANT
STAPLER VISISTAT 35W (STAPLE) ×3 IMPLANT
SUCTION POOLE TIP (SUCTIONS) ×3 IMPLANT
SUT ETHILON 1 LR 30 (SUTURE) IMPLANT
SUT PDS AB 0 CT1 36 (SUTURE) IMPLANT
SUT PDS AB 0 CTX 60 (SUTURE) ×6 IMPLANT
SUT PDS AB 1 CTX 36 (SUTURE) ×6 IMPLANT
SUT PDS AB 1 CTXB1 36 (SUTURE) ×6 IMPLANT
SUT SILK 2 0 (SUTURE)
SUT SILK 2 0 30  PSL (SUTURE)
SUT SILK 2 0 30 PSL (SUTURE) IMPLANT
SUT SILK 2-0 18XBRD TIE 12 (SUTURE) IMPLANT
SUT VIC AB 0 CT1 36 (SUTURE) ×24 IMPLANT
SUT VIC AB 2-0 CT2 27 (SUTURE) IMPLANT
SUT VIC AB 2-0 SH 27 (SUTURE) ×1
SUT VIC AB 2-0 SH 27X BRD (SUTURE) ×2 IMPLANT
SUT VIC AB 3-0 CTX 36 (SUTURE) IMPLANT
SUT VIC AB 4-0 PS1 27 (SUTURE) IMPLANT
SUT VICRYL 0 TIES 12 18 (SUTURE) ×6 IMPLANT
SUT VICRYL 2 0 18  UND BR (SUTURE) ×1
SUT VICRYL 2 0 18 UND BR (SUTURE) ×2 IMPLANT
SYR 20CC LL (SYRINGE) ×3 IMPLANT
SYR CONTROL 10ML LL (SYRINGE) IMPLANT
TOWEL OR 17X26 10 PK STRL BLUE (TOWEL DISPOSABLE) ×6 IMPLANT
TOWEL OR NON WOVEN STRL DISP B (DISPOSABLE) ×3 IMPLANT
TRAY FOLEY CATH 14FRSI W/METER (CATHETERS) ×3 IMPLANT
WATER STERILE IRR 1500ML POUR (IV SOLUTION) IMPLANT

## 2011-10-29 NOTE — Progress Notes (Signed)
Pt c/o increased back and abd pain. MD called awaiting call back.

## 2011-10-29 NOTE — Anesthesia Postprocedure Evaluation (Signed)
  Anesthesia Post-op Note  Patient: [name] [name]  Procedure(s) Performed: Procedure(s) (LRB): EXPLORATORY LAPAROTOMY (N/A) HYSTERECTOMY ABDOMINAL (N/A) SALPINGO OOPHERECTOMY (Bilateral) OMENTECTOMY (N/A)  Patient Location: PACU  Anesthesia Type: General  Level of Consciousness: awake and alert   Airway and Oxygen Therapy: Patient Spontanous Breathing  Post-op Pain: mild  Post-op Assessment: Post-op Vital signs reviewed, Patient's Cardiovascular Status Stable, Respiratory Function Stable, Patent Airway and No signs of Nausea or vomiting  Post-op Vital Signs: stable  Complications: No apparent anesthesia complications

## 2011-10-29 NOTE — Transfer of Care (Signed)
Immediate Anesthesia Transfer of Care Note  Patient: [name] [name]  Procedure(s) Performed: Procedure(s) (LRB): EXPLORATORY LAPAROTOMY (N/A) HYSTERECTOMY ABDOMINAL (N/A) SALPINGO OOPHERECTOMY (Bilateral) OMENTECTOMY (N/A)  Patient Location: PACU  Anesthesia Type: General  Level of Consciousness: awake, oriented and patient cooperative  Airway & Oxygen Therapy: Patient Spontanous Breathing and Patient connected to face mask oxygen  Post-op Assessment: Report given to PACU RN, Post -op Vital signs reviewed and stable and Patient moving all extremities  Post vital signs: Reviewed and stable  Complications: No apparent anesthesia complications

## 2011-10-29 NOTE — Anesthesia Preprocedure Evaluation (Addendum)
Anesthesia Evaluation  Patient identified by MRN, date of birth, ID band Patient awake    Reviewed: Allergy & Precautions, H&P , NPO status , Patient's Chart, lab work & pertinent test results  Airway Mallampati: II TM Distance: >3 FB Neck ROM: Full    Dental No notable dental hx.    Pulmonary shortness of breath, COPD COPD inhaler,  CXR: COPD changes. breath sounds clear to auscultation  Pulmonary exam normal       Cardiovascular hypertension, Pt. on home beta blockers + dysrhythmias + Valvular Problems/Murmurs Rhythm:Regular Rate:Normal  ECG: Septal q waves.   Neuro/Psych negative neurological ROS  negative psych ROS   GI/Hepatic Neg liver ROS, GERD-  Medicated,  Endo/Other  Diabetes mellitus-, Oral Hypoglycemic Agents  Renal/GU negative Renal ROS  negative genitourinary   Musculoskeletal negative musculoskeletal ROS (+)   Abdominal   Peds negative pediatric ROS (+)  Hematology negative hematology ROS (+)   Anesthesia Other Findings   Reproductive/Obstetrics negative OB ROS                           Anesthesia Physical Anesthesia Plan  ASA: III  Anesthesia Plan: General   Post-op Pain Management:    Induction: Intravenous  Airway Management Planned: Oral ETT  Additional Equipment:   Intra-op Plan:   Post-operative Plan: Extubation in OR  Informed Consent: I have reviewed the patients History and Physical, chart, labs and discussed the procedure including the risks, benefits and alternatives for the proposed anesthesia with the patient or authorized representative who has indicated his/her understanding and acceptance.   Dental advisory given  Plan Discussed with: CRNA  Anesthesia Plan Comments:        Anesthesia Quick Evaluation

## 2011-10-29 NOTE — H&P (View-Only) (Signed)
Consult Note: Gyn-Onc   Consult was requested by Dr. Posey Rea  for the evaluation of [name] [name] 71 y.o. female CC:  Malignant ascites. Chief Complaint  Patient presents with  . Pelvic Mass    New pt    HPI: gravida 2 para 2 last normal menstrual period the age of 71. [name] [name] history is notable for breast cancer diagnosed in 1992 treated with a left radical mastectomy and subsequent methotrexate 5-FU and leucovorin. [name] [name] has been amenorrheic since.  In December 2012 she noted abdominal bloating that worsened. She reports feeling an increase in the size of her abdomen and intermittent episodes of diarrhea and loose stool. This worsened and she was evaluated by her primary care practitioner. A CT scan of the abdomen and pelvis was performed on 10/15/2011. This was notable for the absence of evidence of a bowel obstruction. There was a  moderate amount of abdominal pelvic ascites. Omental caking was appreciated with the presence of a 6.6 cm omental implant. Peritoneal implants were noted in addition to a periumbilical hernia and a 9 mm subcutaneous implant. An ultrasound-guided paracentesis was performed on 10/18/2011. The pathology returned  PERITONEAL/ ASCITIC FLUID: MALIGNANT CELLS CONSISTENT WITH METASTATIC ADENOCARCINOMA. SEE COMMENT. COMMENT: IMMUNOHISTOCHEMICAL STAINING IS PERFORMED. THE CARCINOMA IS POSITIVE FOR CYTOKERATIN 7 AND WT-1. THERE IS EQUIVOCAL STAINING FOR ESTROGEN RECEPTOR. CYTOKERATIN 20, CALRETININ, AND CDX-2 ARE ALL NEGATIVE. THE STAINING PATTERN IS HIGHLY SUGGESTIVE OF A PRIMARY GYNECOLOGIC ORIGIN.  ACA 125 returned with a value of 2050.7.  Her family history is notable for mother diagnosed with synchronous endometrial and colon cancer the age of 58. A brother was diagnosed with colon cancer in his mid-50s. The patient underwent colonoscopy 2 years ago with removal of 2 benign polyps. She denies any awareness of genetic testing in any of the members of her  family.   Review of Systems:  Constitutional  Feels well, poor appetite 3 pounds weight loss since December of 2012 Skin/Breast  No rash, sores, jaundice, itching, dryness Cardiovascular  No chest pain, shortness of breath, or edema  Pulmonary  No cough or wheeze. Reports 3 pillow orthopnea Gastro Intestinal  No nausea, vomit ting.  Reports intermittent soft stool and diarrhea.   No bright red blood per rectum, no abdominal pain, change in bowel movement, or constipation. Reports increase in ascites since the last paracentesis Genito Urinary  No frequency, urgency, dysuria, no vaginal bleeding or discharge. Musculo Skeletal  No myalgia, arthralgia, joint swelling or pain  Neurologic  No weakness, numbness, change in gait,  Psychology  No depression, anxiety, insomnia.    Current Meds:  Outpatient Encounter Prescriptions as of 10/22/2011  Medication Sig Dispense Refill  . albuterol-ipratropium (COMBIVENT) 18-103 MCG/ACT inhaler Inhale 2 puffs into the lungs 4 (four) times daily.        Marland Kitchen ALPRAZolam (XANAX) 0.5 MG tablet Take 1 tablet (0.5 mg total) by mouth 2 (two) times daily.  60 tablet  5  . aspirin 81 MG tablet Take 81 mg by mouth daily.        Marland Kitchen atenolol (TENORMIN) 50 MG tablet Take 0.5 tablets (25 mg total) by mouth 2 (two) times daily.  90 tablet  1  . b complex vitamins tablet Take 1 tablet by mouth daily.        . budesonide-formoterol (SYMBICORT) 160-4.5 MCG/ACT inhaler Inhale 2 puffs into the lungs 2 (two) times daily.  1 Inhaler  5  . buPROPion (WELLBUTRIN SR) 150 MG 12 hr tablet  Take 1 tablet (150 mg total) by mouth 2 (two) times daily.  180 tablet  2  . busPIRone (BUSPAR) 15 MG tablet Take 1 tablet (15 mg total) by mouth 2 (two) times daily.  180 tablet  1  . cholecalciferol (VITAMIN D) 1000 UNITS tablet Take 1,000 Units by mouth daily.        Marland Kitchen glucose blood (ONE TOUCH TEST STRIPS) test strip Use as instructed  100 each  3  . ipratropium-albuterol (DUONEB) 0.5-2.5  (3) MG/3ML SOLN INHALE 1 VIAL VIA HAND HELD NEBULIZER 4 TIMES A DAY  360 mL  3  . lovastatin (MEVACOR) 40 MG tablet Take 1 tablet (40 mg total) by mouth at bedtime.  90 tablet  2  . metFORMIN (GLUCOPHAGE) 1000 MG tablet Take 1 tablet (1,000 mg total) by mouth 2 (two) times daily with a meal.  180 tablet  3  . omeprazole (PRILOSEC) 40 MG capsule Take 1 capsule (40 mg total) by mouth daily.  90 capsule  3  . repaglinide (PRANDIN) 2 MG tablet Take 2 mg by mouth 3 (three) times daily as needed.       . sertraline (ZOLOFT) 100 MG tablet Take 1 tablet (100 mg total) by mouth daily.  90 tablet  1  . Diclofenac Sodium (PENNSAID) 1.5 % SOLN Place 3-5 drops onto the skin 3 (three) times daily as needed.        . ranitidine (ZANTAC) 150 MG tablet Take 1 tablet (150 mg total) by mouth 2 (two) times daily.  180 tablet  3  . roflumilast (DALIRESP) 500 MCG TABS tablet Take 500 mcg by mouth daily.          Allergy:  Allergies  Allergen Reactions  . Varenicline Tartrate     Social Hx:   History   Social History  . Marital Status: Married    Spouse Name: N/A    Number of Children: N/A  . Years of Education: N/A   Occupational History  . Not on file.   Social History Main Topics  . Smoking status: Current Everyday Smoker -- 1.0 packs/day    Types: Cigarettes  . Smokeless tobacco: Not on file   Comment: quit again 2009; restarted 1 ppd 2011  . Alcohol Use: No  . Drug Use: No  . Sexually Active: Not Currently   Other Topics Concern  . Not on file   Social History Narrative  . No narrative on file    Past Surgical Hx:  Past Surgical History  Procedure Date  . Cesarean section 1980, 1982    X 2   . Laparoscopy     work up for infertility  . Tympanoplasty     left X 2  . Left breast fibroadenoma removal 1960's  . Breast cyst aspirations   . Left modified radial mastectomy     Past Medical Hx:  Past Medical History  Diagnosis Date  . History of breast cancer   . Cancer      breast  . COPD (chronic obstructive pulmonary disease)   . GERD (gastroesophageal reflux disease)   . Diabetes mellitus     type II  . Hx of colonic polyps   . Osteoarthritis     Past Gynecological History:  Gravida 2 para 2 menarche occurred at age of 41 with regular menses until menopause was chemotherapy induced to the age of 27. 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  Used oral contraceptive pills for a short while.  Family Hx:  Family History  Problem Relation Age of Onset  . Cancer Mother     endometrial/ colon  . Cancer Brother     colon  . COPD Other   . Hypertension Other     Vitals:  Blood pressure 110/54, pulse 78, temperature 98 F (36.7 C), resp. rate 22, height 5' 4.84" (1.647 m), weight 61.689 kg (136 lb).  Physical Exam: WD in NAD Neck  Supple NROM, without any enlargements.  Lymph Node Survey No cervical supraclavicular or inguinal adenopathy Cardiovascular  Pulse normal rate, regularity and rhythm. S1 and S2 normal.  Lungs  Clear to auscultation bilateraly, distant breath sounds without wheezes/crackles/rhonchi.  Skin  No rash/lesions/breakdown  Psychiatry  Alert and oriented to person, place, and time  Abdomen  Normoactive bowel sounds, abdomen soft, non-tender.  Umbilical hernia noted no palpable masses.  Evidence of ascites. Omental cake palpable.  Back No CVA tenderness Genito Urinary  Vulva/vagina: Normal external female genitalia.  No lesions. No discharge or bleeding.  Bladder/urethra:  No lesions or masses  Vagina: Atrophic without any lesions or discharge  Cervix: Normal 1.5 cm appearing, no lesions.  Uterus: Small, mobile,   Adnexa: Palpable pelvic mass is noted.  Rectal  Good tone, nodularity within the cul-de-sac palpable pelvic mass  Extremities  No bilateral cyanosis, clubbing or edema.   Assessment/Plan:  [name] [name]  is a 71 y.o.  year old with a history of breast cancer treated approximately 20 years ago who now presents  with malignant ascites and CA 125 elevated to 2050.7 imaging is notable for the presence of an omental cake ascites no note is made of a pelvic mass. However on pelvic examination cul-de-sac nodularity is appreciated in addition to a pelvic mass.  The patient was consult that the recommendation would be exploratory laparotomy with staging  Procedure is  inclusive of hysterectomy bilateral salpingo-oophorectomy omentectomy, and radical debulking to achieve the minimum residual disease. The patient is aware that this may require a rectosigmoid resection.  She is aware to consideration place for placement of an intraperitoneal catheter but this would be dependent upon the surgical findings and the surgical procedure performed. She is also aware that chemotherapy will be administered after surgical debulking . The risks of the procedure discussed with the patient and her husband were that of infection bleeding damage to surrounding structures prolonged hospitalization and reoperation. [name] [name] is aware that Dr. Rockney Ghee will be her surgeon that the procedure will take place on October 01 2011.  All of her questions were answered to her satisfaction.  Consideration will be given to testing the tumor for microsatellite instability.    Laurette Schimke, MD, PhD 10/22/2011, 12:25 PM

## 2011-10-29 NOTE — Interval H&P Note (Signed)
History and Physical Interval Note:  10/29/2011 10:34 AM  [name] [name]  has presented today for surgery, with the diagnosis of ovarian cancer  The various methods of treatment have been discussed with the patient and family. After consideration of risks, benefits and other options for treatment, the patient has consented to  Procedure(s) (LRB): EXPLORATORY LAPAROTOMY (N/A) HYSTERECTOMY ABDOMINAL (N/A) SALPINGO OOPHERECTOMY (Bilateral) OMENTECTOMY (N/A) as a surgical intervention .  The patients' history has been reviewed, patient examined, no change in status, stable for surgery.  I have reviewed the patients' chart and labs.  Questions were answered to the patient's satisfaction.     [name] [name] A.

## 2011-10-29 NOTE — Op Note (Signed)
PATIENT: [name] [name] DATE OF BIRTH: 02/23/1941 ENCOUNTER DATE:    Preop Diagnosis: IIIC ovarian cancer  Postoperative Diagnosis: Suboptimal IIIC ovarian cancer  Surgery: Total abdominal hysterectomy, bilateral salpingo-oophorectomy, omentectomy, radical tumor debulking involving peritoneal stripping of the bladder peritoneum, posterior peritoneum, cecal nodule, and anterior abdominal wall peritoneum.  Surgeons:  Bernita Buffy. Duard Brady, MD; Antionette Char, MD   Assistant: Telford Nab   Anesthesia: General   Estimated blood loss: 250 ml   IVF: 3000 ml   Urine output:  175 ml   Ascites: 2500 ml  Complications: None   Pathology: Uterus, cervix, bilateral tubes and ovaries, omentum, posterior cul-de-sac peritoneum, bladder peritoneum, umbilical nodule, cecal nodule  Operative findings: Extensive carcinomatosis with significant disease involving the diaphragm on the right side. I am the majority of nodules are less than 1 cm however there were fairly confluent. The omentum had a 6 cm primary nodule with several other nodules involving both hepatic and splenic flexure. Within the pelvis was applied overlying the bladder peritoneum as well as a 4 cm Platte on the peritoneum by the rectosigmoid colon. There is a 2 cm plaqued by the cecum. The adnexa were involved with tumor but there was no dominant adnexal mass. At the completion of the procedure the patient had a 2 cm nodule involving the cul-de-sac, diffuse small miliary disease involving the mesentery of the small and large bowel, and subcentimeter disease involving the right hemidiaphragm however it was fairly confluent. I would consider the patient to be suboptimally cyto-reduced.  Procedure: Patient was identified in the preoperative holding area spell. Informed consent was signed on the chart. Risks and benefits the procedure were discussed with the patient she wished to proceed. The patient was taken to the operating room placed in  the supine position where general anesthesia was induced. She was then placed in the dorsolithotomy position. Exam under anesthesia revealed significant ascites with a large fluid wave. Nodularity at the level of the umbilicus. There is nodularity on rectovaginal examination. The perineum was then prepped with Betadine a Foley catheter was inserted and the bladder under sterile conditions. The abdomen was prepped prepped with core chlor prep sponge. After the prep to ride for 3 minutes the patient was prepped in usual fashion. Timeout was performed to confirm the patient procedure antibiotic allergy status. The vertical midline incision was made with a knife and carried down to the level of the fashion using Bovie cautery. The fascia was scored in  the midline and the fascial incision was extended superiorly and inferiorly. The peritoneal cavity was then entered. Approximately 2 L of ascites was drawn out from the abdominal cavity. The abdominal findings as above were noted. After assuring adequate incision. A Buchwalter self-retaining retractor was placed on the bed. This point all points during the procedure there was no significant pressure on the psoas muscles.  Attention was first Ron's the omentum. Anesthesia was asked to place an NG to. As placement was confirmed. The peritoneum of the intracolic omentum was scored. This incision was carried along the entire antimesenteric aspect of the transverse colon.  New the aspect of the hepatic flexure there was a separate 4 cm nodule that needed to be removed. Therefore it was to the short gastrics were taken down on the patient's right side. Each pedicle was created at clamped and suture ligated with 2-0 Vicryl. This allowed complete removal of a 4 cm lateral. We then continued on the transverse colon removing the omentum. Were possible Bovie cautery  was used on and where required separate pedicles were made transected and suture ligated. Reach the aspect of the  splenic flexure. Spinal flexure was taken down on the left colon by scored the peritoneum. This allowed the omentum in the tumor to come ports visual field. Also there not be significant traction on the spleen. The disease did not enter the splenic hilum. Once the splenic flexure was brought down were then able to continue with resection of the omental disease using Bovie cautery. Were required on pedicles were created and suture ligated. We were able to move the omentum completely there is no residual disease in that aspect.  Our attention was then drawn to the pelvis. The largest small bowel packed out of the way with moist laparotomy sponges and the patient was placed in Trendelenburg position. The broad ligament on the patient's left side was transected with monopolar cautery and anterior posterior leaves the broad ligament were opened. The ureter was identified. The ureter a window was made between IP and the ureter. The IP was transected and suture ligated. Posteriorly due to the amount of disease on the posterior peritoneum on the left side on the ureter was dissected free of the medial leaf of the peritoneum the level of the uterine vessels. The medial peritoneum was then stripped  away to allow removal of the disease. The uterine vessels were then skeletonized and the anterior broad ligament the bladder flap was created. After identifying the uterine vessels was clamped with a curved Masterson clamps ligated and suture. The bladder flap was adequately created. Our attention was drawn to the right side the round ligament was transected the bladder flap was completed anteriorly. The posterior leaf of the broad ligament was then opened. The ureter was identified. A window was made between the IP and the ureter. The IP was clamped transected and suture ligated. Similar to the left side the uterine vessels were skeletonized clamped with curved Masterson's transected and suture ligated. We entered the space  between the vagina and the rectum to allow mobilization of the uterus due to tumor plaque involving the posterior aspect of the uterus. We continued on to  the cardinal ligament creating pedicles which were clamped and suture ligated. The dissection continued  to the cervicovaginal junction and 2 curved Masterson's were used in the midline. The cervix was amputated from the vagina and the vagina was closed with figure-of-eight sutures of 0 Vicryl. The ureter was noted on the left be well lateral and inferior to the area of dissection. The peritoneum posteriorly was then grasped with Allises and with the exception of approximately 2 x 2 centimeter plaque that would require the rectosigmoid resection the peritoneum was stripped clean posteriorly in the cul-de-sac. The anterior peritoneum overlying the bladder was grasped with Allis clamps. The bladder peritoneum was completely removed to dissect off the disease anterior to the bladder.  Adequate hemostasis was ensured. Our attention was then drawn to the patient's right side. The peritoneum by the cecum was opened using Bovie cautery. The peritoneal based nodule near the cecum was then removed using pin point cautery and meticulous dissection.  At this point we can reinspected the bladder peritoneum and it was noted to be hemostatic and the bladder was intact. All other pedicles were similarly intact and hemostatic. Palpation of the right hemidiaphragm again revealed significant disease in that aspect and the decision was made to conclude the debulking procedure secondary to the significant morbidity that could ensue from the dissection and  potential inability to remove all of the disease.  The Buchwalter was then removed all laparotomy sponges were removed from the abdomen. There was an appropriate first count. The fascia was closed using running suture of #1 PDS. Exparel then used for postoperative pain control. The skin was closed using staples.  Patient  tolerated procedure well.  All instrument, needle, laparotomy pads and Ray-Tec counts were correct x2. The patient tolerated the procedure well and was taken to the recovery room in stable condition.   This is [facility] dictating an operative note on patient [name] [name].

## 2011-10-29 NOTE — Preoperative (Signed)
Beta Blockers   Reason not to administer Beta Blockers:Not Applicable 

## 2011-10-29 NOTE — Progress Notes (Signed)
Pt on atenolol po. Pt has ng tube and is NPO. Called MD awaiting call back.

## 2011-10-30 LAB — GLUCOSE, CAPILLARY
Glucose-Capillary: 185 mg/dL — ABNORMAL HIGH (ref 70–99)
Glucose-Capillary: 185 mg/dL — ABNORMAL HIGH (ref 70–99)
Glucose-Capillary: 160 mg/dL — ABNORMAL HIGH (ref 70–99)
Glucose-Capillary: 134 mg/dL — ABNORMAL HIGH (ref 70–99)

## 2011-10-30 LAB — CBC
HCT: 34.4 % — ABNORMAL LOW (ref 36.0–46.0)
Hemoglobin: 11.3 g/dL — ABNORMAL LOW (ref 12.0–15.0)
RBC: 4.05 MIL/uL (ref 3.87–5.11)

## 2011-10-30 LAB — DIFFERENTIAL
Basophils Relative: 0 % (ref 0–1)
Eosinophils Relative: 0 % (ref 0–5)
Lymphs Abs: 2.1 10*3/uL (ref 0.7–4.0)
Monocytes Absolute: 2.1 10*3/uL — ABNORMAL HIGH (ref 0.1–1.0)

## 2011-10-30 LAB — BASIC METABOLIC PANEL
CO2: 22 mEq/L (ref 19–32)
Glucose, Bld: 206 mg/dL — ABNORMAL HIGH (ref 70–99)
Potassium: 4.7 mEq/L (ref 3.5–5.1)
Sodium: 133 mEq/L — ABNORMAL LOW (ref 135–145)

## 2011-10-30 MED ORDER — ALBUTEROL SULFATE (5 MG/ML) 0.5% IN NEBU
2.5000 mg | INHALATION_SOLUTION | Freq: Three times a day (TID) | RESPIRATORY_TRACT | Status: DC
  Administered 2011-10-30 – 2011-11-03 (×13): 2.5 mg via RESPIRATORY_TRACT
  Filled 2011-10-30 (×8): qty 0.5

## 2011-10-30 MED ORDER — FAMOTIDINE IN NACL 20-0.9 MG/50ML-% IV SOLN
20.0000 mg | Freq: Two times a day (BID) | INTRAVENOUS | Status: DC
  Administered 2011-10-30 (×2): 20 mg via INTRAVENOUS
  Filled 2011-10-30 (×3): qty 50

## 2011-10-30 MED ORDER — HYDROMORPHONE 0.3 MG/ML IV SOLN
INTRAVENOUS | Status: AC
  Administered 2011-10-30: 1.79 mg via INTRAVENOUS
  Filled 2011-10-30: qty 25

## 2011-10-30 MED ORDER — BISACODYL 10 MG RE SUPP
10.0000 mg | Freq: Every morning | RECTAL | Status: DC
  Administered 2011-10-30 – 2011-10-31 (×2): 10 mg via RECTAL
  Filled 2011-10-30 (×2): qty 1

## 2011-10-30 MED ORDER — ENOXAPARIN SODIUM 40 MG/0.4ML ~~LOC~~ SOLN
40.0000 mg | SUBCUTANEOUS | Status: DC
  Administered 2011-10-30 – 2011-11-03 (×5): 40 mg via SUBCUTANEOUS
  Filled 2011-10-30 (×5): qty 0.4

## 2011-10-30 MED ORDER — LORAZEPAM 2 MG/ML IJ SOLN
0.5000 mg | Freq: Two times a day (BID) | INTRAMUSCULAR | Status: DC | PRN
Start: 2011-10-30 — End: 2011-10-31

## 2011-10-30 MED ORDER — ENOXAPARIN (LOVENOX) PATIENT EDUCATION KIT
PACK | Freq: Once | Status: DC
  Filled 2011-10-30: qty 1

## 2011-10-30 MED ORDER — BUDESONIDE-FORMOTEROL FUMARATE 160-4.5 MCG/ACT IN AERO
2.0000 | INHALATION_SPRAY | Freq: Two times a day (BID) | RESPIRATORY_TRACT | Status: DC
  Administered 2011-10-30 – 2011-11-03 (×9): 2 via RESPIRATORY_TRACT
  Filled 2011-10-30: qty 6

## 2011-10-30 MED ORDER — PANTOPRAZOLE SODIUM 40 MG IV SOLR
40.0000 mg | INTRAVENOUS | Status: DC
  Administered 2011-10-30: 40 mg via INTRAVENOUS
  Filled 2011-10-30 (×2): qty 40

## 2011-10-30 MED ORDER — PHENOL 1.4 % MT LIQD
1.0000 | OROMUCOSAL | Status: DC | PRN
  Administered 2011-10-30: 1 via OROMUCOSAL
  Filled 2011-10-30: qty 177

## 2011-10-30 MED ORDER — HYDROMORPHONE 0.3 MG/ML IV SOLN
INTRAVENOUS | Status: AC
  Filled 2011-10-30: qty 25

## 2011-10-30 MED ORDER — IPRATROPIUM BROMIDE 0.02 % IN SOLN
0.5000 mg | Freq: Three times a day (TID) | RESPIRATORY_TRACT | Status: DC
  Administered 2011-10-30 – 2011-11-03 (×13): 0.5 mg via RESPIRATORY_TRACT
  Filled 2011-10-30 (×7): qty 2.5

## 2011-10-30 NOTE — Progress Notes (Signed)
Patient ID: [name] [name], female   DOB: [birth year], 71 y.o.   MRN: [id number] 1 Day Post-Op Procedure(s) (LRB): EXPLORATORY LAPAROTOMY (N/A) HYSTERECTOMY ABDOMINAL (N/A) SALPINGO OOPHERECTOMY (Bilateral) OMENTECTOMY (N/A)  Subjective: Patient reports sore throat.    Objective: Vital signs in last 24 hours: Temp:  [97 F (36.1 C)-98.5 F (36.9 C)] 98.4 F (36.9 C) (03/27 0555) Pulse Rate:  [81-96] 96  (03/27 0555) Resp:  [11-18] 13  (03/27 0803) BP: (105-140)/(46-70) 123/61 mmHg (03/27 0555) SpO2:  [90 %-99 %] 91 % (03/27 0803) FiO2 (%):  [93 %-94 %] 94 % (03/27 0444) Weight:  [60.782 kg (134 lb)] 60.782 kg (134 lb) (03/26 1704) Last BM Date: 10/29/11  Intake/Output from previous day: 03/26 0701 - 03/27 0700 In: 3930.7 [I.V.:3900.7; NG/GT:30] Out: 3965 [Urine:925; Emesis/NG output:140; Blood:400]  CBG (last 3)   Basename 10/30/11 0723 10/30/11 0502 10/30/11 0011  GLUCAP 214* 185* 219*     Physical Examination: General: alert GI: soft, non-tender; bowel sounds normal; no masses,  no organomegaly and incision: clean, dry and intact Extremities: extremities normal, atraumatic, no cyanosis or edema   Labs: WBC/Hgb/Hct/Plts:  19.1/11.3/34.4/579 (03/27 0439) BUN/Cr/glu/ALT/AST/amyl/lip:  9/0.63/--/--/--/--/-- (03/27 0439)   Assessment:  71 y.o. s/p Procedure(s): EXPLORATORY LAPAROTOMY HYSTERECTOMY ABDOMINAL SALPINGO OOPHERECTOMY OMENTECTOMY: stable Pain:  Pain is well-controlled on PCA.  ID: Leukocytosis, doubt infectious etiology  GI:  NGT with small drainage.   GI stress ulcer prophylaxis includes: proton pump inhibitor per orders.  Pulm:  H/o COPD; O2 sats OK    Endo: Diabetes mellitus Type II, under fair control.Marland Kitchen   Prophylaxis: pharmacologic prophylaxis (with any of the following: enoxaparin (Lovenox) 40mg  SQ 2 hours prior to surgery then every day) and intermittent pneumatic compression boots.  Plan: Encourage ambulation D/C NGT this pm.  CBC w/diff  in am   LOS: 1 day    JACKSON-MOORE,Corlene Sabia A 10/30/2011, 8:47 AM

## 2011-10-31 ENCOUNTER — Other Ambulatory Visit: Payer: Self-pay | Admitting: Oncology

## 2011-10-31 DIAGNOSIS — C569 Malignant neoplasm of unspecified ovary: Secondary | ICD-10-CM

## 2011-10-31 LAB — BASIC METABOLIC PANEL
CO2: 28 mEq/L (ref 19–32)
Calcium: 8 mg/dL — ABNORMAL LOW (ref 8.4–10.5)
GFR calc Af Amer: >90 mL/min (ref >90)
GFR calc non Af Amer: >90 mL/min (ref >90)
Sodium: 136 mEq/L (ref 135–145)

## 2011-10-31 LAB — CBC
MCH: 27.7 pg (ref 26.0–34.0)
Platelets: 505 10*3/uL — ABNORMAL HIGH (ref 150–400)
RBC: 3.58 MIL/uL — ABNORMAL LOW (ref 3.87–5.11)
RDW: 14.2 % (ref 11.5–15.5)

## 2011-10-31 LAB — GLUCOSE, CAPILLARY: Glucose-Capillary: 137 mg/dL — ABNORMAL HIGH (ref 70–99)

## 2011-10-31 MED ORDER — OXYMETAZOLINE HCL 0.05 % NA SOLN
2.0000 | Freq: Two times a day (BID) | NASAL | Status: DC | PRN
  Filled 2011-10-31: qty 15

## 2011-10-31 MED ORDER — REPAGLINIDE 2 MG PO TABS
2.0000 mg | ORAL_TABLET | Freq: Three times a day (TID) | ORAL | Status: DC | PRN
  Filled 2011-10-31: qty 1

## 2011-10-31 MED ORDER — BUPROPION HCL ER (SR) 150 MG PO TB12
150.0000 mg | ORAL_TABLET | Freq: Every day | ORAL | Status: DC
  Administered 2011-10-31 – 2011-11-03 (×4): 150 mg via ORAL
  Filled 2011-10-31 (×4): qty 1

## 2011-10-31 MED ORDER — DIPHENHYDRAMINE HCL 25 MG PO CAPS
25.0000 mg | ORAL_CAPSULE | Freq: Four times a day (QID) | ORAL | Status: DC | PRN
  Administered 2011-10-31 – 2011-11-02 (×3): 25 mg via ORAL
  Filled 2011-10-31 (×3): qty 1

## 2011-10-31 MED ORDER — PANTOPRAZOLE SODIUM 40 MG PO TBEC
40.0000 mg | DELAYED_RELEASE_TABLET | Freq: Every day | ORAL | Status: DC
  Administered 2011-10-31 – 2011-11-02 (×3): 40 mg via ORAL
  Filled 2011-10-31 (×4): qty 1

## 2011-10-31 MED ORDER — METFORMIN HCL 500 MG PO TABS
1000.0000 mg | ORAL_TABLET | Freq: Two times a day (BID) | ORAL | Status: DC
  Administered 2011-10-31 – 2011-11-03 (×7): 1000 mg via ORAL
  Filled 2011-10-31 (×8): qty 2

## 2011-10-31 MED ORDER — BUSPIRONE HCL 15 MG PO TABS
15.0000 mg | ORAL_TABLET | Freq: Two times a day (BID) | ORAL | Status: DC
  Administered 2011-10-31 – 2011-11-03 (×7): 15 mg via ORAL
  Filled 2011-10-31 (×8): qty 1

## 2011-10-31 MED ORDER — METFORMIN HCL 500 MG PO TABS: 1000.0000 mg | ORAL_TABLET | Freq: Two times a day (BID) | ORAL | Status: DC

## 2011-10-31 MED ORDER — ALPRAZOLAM 0.5 MG PO TABS
0.5000 mg | ORAL_TABLET | Freq: Two times a day (BID) | ORAL | Status: DC | PRN
  Administered 2011-10-31 – 2011-11-02 (×4): 0.5 mg via ORAL
  Filled 2011-10-31 (×4): qty 1

## 2011-10-31 MED ORDER — SERTRALINE HCL 100 MG PO TABS
100.0000 mg | ORAL_TABLET | Freq: Every day | ORAL | Status: DC
  Administered 2011-11-01 – 2011-11-03 (×3): 100 mg via ORAL
  Filled 2011-10-31 (×3): qty 1

## 2011-10-31 NOTE — Progress Notes (Signed)
Per Dr. Tamela Oddi, pt does not need to maintain IV access. Marcelino Duster, RN

## 2011-10-31 NOTE — Progress Notes (Signed)
Patient ID: [name] [name], female   DOB: April 10, 1941, 71 y.o.   MRN: [id number] 2 Days Post-Op Procedure(s) (LRB): EXPLORATORY LAPAROTOMY (N/A) HYSTERECTOMY ABDOMINAL (N/A) SALPINGO OOPHERECTOMY (Bilateral) OMENTECTOMY (N/A)  Subjective: Patient reports  no problems voiding.    Objective: Vital signs in last 24 hours: Temp:  [97.7 F (36.5 C)-99.6 F (37.6 C)] 98.6 F (37 C) (03/28 0545) Pulse Rate:  [82-94] 82  (03/28 0545) Resp:  [17-22] 20  (03/28 0545) BP: (99-114)/(52-62) 114/58 mmHg (03/28 0545) SpO2:  [2 %-96 %] 96 % (03/28 0545) Last BM Date: 10/29/11  Intake/Output from previous day: 03/27 0701 - 03/28 0700 In: 4167.1 [P.O.:1440; I.V.:2727.1] Out: 1950 [Urine:1950]  CBG (last 3)   Basename 10/31/11 0731 10/31/11 0403 10/31/11 0043  GLUCAP 137* 130* 147*     Physical Examination: General: alert and cooperative Resp: clear to auscultation bilaterally GI: soft, non-tender; bowel sounds normal; no masses,  no organomegaly and incision: clean, dry and intact Extremities: extremities normal, atraumatic, no cyanosis or edema  Labs: WBC/Hgb/Hct/Plts:  14.0/9.9/30.7/505 (03/28 0440) BUN/Cr/glu/ALT/AST/amyl/lip:  3/0.59/--/--/--/--/-- (03/28 0440)   Assessment:  71 y.o. s/p Procedure(s): EXPLORATORY LAPAROTOMY HYSTERECTOMY ABDOMINAL SALPINGO OOPHERECTOMY OMENTECTOMY: progressing well Pain:  Pain is well-controlled on PCA.  CV: Hypertension: Controlled   GI:  Tolerating po: Yes     Endo: Diabetes mellitus Type II, under good control.Marland Kitchen    Prophylaxis: pharmacologic prophylaxis (with any of the following: enoxaparin (Lovenox) 40mg  SQ 2 hours prior to surgery then every day) and intermittent pneumatic compression boots.  Plan: Advance diet Encourage ambulation Discontinue IV fluids   LOS: 2 days    JACKSON-MOORE,Laria Grimmett A 10/31/2011, 8:11 AM

## 2011-11-01 ENCOUNTER — Telehealth: Payer: Self-pay | Admitting: Oncology

## 2011-11-01 ENCOUNTER — Encounter (HOSPITAL_COMMUNITY): Payer: Self-pay | Admitting: Gynecologic Oncology

## 2011-11-01 LAB — GLUCOSE, CAPILLARY: Glucose-Capillary: 95 mg/dL (ref 70–99)

## 2011-11-01 MED ORDER — SIMETHICONE 80 MG PO CHEW
80.0000 mg | CHEWABLE_TABLET | Freq: Four times a day (QID) | ORAL | Status: DC | PRN
  Administered 2011-11-01 (×3): 80 mg via ORAL
  Filled 2011-11-01 (×3): qty 1

## 2011-11-01 MED ORDER — SIMETHICONE 80 MG PO CHEW
80.0000 mg | CHEWABLE_TABLET | Freq: Two times a day (BID) | ORAL | Status: DC | PRN
  Filled 2011-11-01 (×2): qty 1

## 2011-11-01 MED ORDER — BISACODYL 10 MG RE SUPP
10.0000 mg | Freq: Two times a day (BID) | RECTAL | Status: DC
  Administered 2011-11-01 – 2011-11-02 (×3): 10 mg via RECTAL
  Filled 2011-11-01 (×3): qty 1

## 2011-11-01 MED ORDER — NICOTINE 21 MG/24HR TD PT24
21.0000 mg | MEDICATED_PATCH | Freq: Every day | TRANSDERMAL | Status: DC
  Administered 2011-11-01 – 2011-11-02 (×2): 21 mg via TRANSDERMAL
  Filled 2011-11-01 (×3): qty 1

## 2011-11-01 MED ORDER — BISACODYL 10 MG RE SUPP: 10.0000 mg | Freq: Two times a day (BID) | RECTAL | Status: DC | PRN

## 2011-11-01 NOTE — Telephone Encounter (Signed)
S/w the pt and she is aware of her appts on 11/15/2011.gv lisa the request for her to schedule the genetics testing appt

## 2011-11-01 NOTE — Progress Notes (Signed)
3 Days Post-Op Procedure(s) (LRB): EXPLORATORY LAPAROTOMY (N/A) HYSTERECTOMY ABDOMINAL (N/A) SALPINGO OOPHERECTOMY (Bilateral) OMENTECTOMY (N/A)  Subjective: Patient reports tolerating PO and no problems voiding.   - flatus. Objective: I have reviewed patient's vital signs, intake and output, medications, labs and pathology.  General: alert and no distress Resp: clear to auscultation bilaterally Cardio: regular rate and rhythm, S1, S2 normal, no murmur, click, rub or gallop GI: incision: clean, dry and intact Extremities: extremities normal, atraumatic, no cyanosis or edema Vaginal Bleeding: none  Assessment: s/p Procedure(s) (LRB): EXPLORATORY LAPAROTOMY (N/A) HYSTERECTOMY ABDOMINAL (N/A) SALPINGO OOPHERECTOMY (Bilateral) OMENTECTOMY (N/A): stable  Plan: Advance diet       Ambulate.  Simethicone p.o. and Dulcolax suppository ordered.     Start p.o. Pain meds with regular diet and D/C PCA.                                                                                                                                                                                                                                                                                                                                                                     LOS: 3 days    Rayder Sullenger A 11/01/2011, 8:57 AM

## 2011-11-02 LAB — GLUCOSE, CAPILLARY
Glucose-Capillary: 96 mg/dL (ref 70–99)
Glucose-Capillary: 182 mg/dL — ABNORMAL HIGH (ref 70–99)
Glucose-Capillary: 142 mg/dL — ABNORMAL HIGH (ref 70–99)
Glucose-Capillary: 168 mg/dL — ABNORMAL HIGH (ref 70–99)

## 2011-11-02 MED ORDER — OXYCODONE-ACETAMINOPHEN 5-325 MG PO TABS: 1.0000 | ORAL_TABLET | ORAL | Status: DC | PRN

## 2011-11-02 MED ORDER — IBUPROFEN 800 MG PO TABS: 800.0000 mg | ORAL_TABLET | Freq: Three times a day (TID) | ORAL | Status: AC | PRN

## 2011-11-02 NOTE — Discharge Instructions (Signed)
Routine

## 2011-11-02 NOTE — Discharge Summary (Signed)
Physician Discharge Summary  Patient ID: [name] [name] MRN: [id number] DOB/AGE: 69-Sep-1942 69 y.o.  Admit date: 10/29/2011 Discharge date: 11/02/2011  Admission Diagnoses:   Pelvic Mass Discharge Diagnoses:   Same.  Ovarian Cancer Active Problems:  * No active hospital problems. *    Discharged Condition: good  Hospital Course: Patient underwent exploratory laparotomy, TAH/BSO, and Omentectomy.  There were no complications intraoperatively.  Post op course was uncomplicated.  Patient discharged home on post op say # 4 in good condition.  Consults: None  Significant Diagnostic Studies: labs: Hgb = 9.9  BUN = 3  Creatinine = 0.59  Treatments: IV hydration  Discharge Exam: Blood pressure 128/78, pulse 79, temperature 98 F (36.7 C), temperature source Oral, resp. rate 18, height 5\' 4"  (1.626 m), weight 60.782 kg (134 lb), SpO2 93.00%. General appearance: alert and no distress Resp: clear to auscultation bilaterally Cardio: regular rate and rhythm, S1, S2 normal, no murmur, click, rub or gallop GI: soft, non-tender; bowel sounds normal; no masses,  no organomegaly Extremities: extremities normal, atraumatic, no cyanosis or edema Incision/Wound:  Clean, dry and intact.  Nontender.  Disposition:   Discharge Orders    Future Appointments: Provider: Department: Dept Phone: Center:   11/15/2011 10:30 AM Delcie Roch [facility] [phone] None   11/15/2011 11:00 AM Lowella Dell, MD [facility] [phone] None   11/19/2011 2:15 PM Tresa Garter, MD [facility] [phone] [facility]     Medication List  As of 11/02/2011  8:40 AM   STOP taking these medications         acetaminophen 500 MG tablet      naproxen sodium 220 MG tablet         TAKE these medications         ALPRAZolam 0.5 MG tablet   Commonly known as: XANAX   Take 1 tablet (0.5 mg total) by mouth 2 (two) times daily.      aspirin 81 MG tablet   Take 81 mg by mouth every evening.     atenolol 50 MG tablet   Commonly known as: TENORMIN   Take 25 mg by mouth 2 (two) times daily.      b complex vitamins tablet   Take 1 tablet by mouth daily.      budesonide-formoterol 160-4.5 MCG/ACT inhaler   Commonly known as: SYMBICORT   Inhale 2 puffs into the lungs 2 (two) times daily.      buPROPion 150 MG 12 hr tablet   Commonly known as: WELLBUTRIN SR   Take 150 mg by mouth daily after breakfast.      busPIRone 15 MG tablet   Commonly known as: BUSPAR   Take 1 tablet (15 mg total) by mouth 2 (two) times daily.      cholecalciferol 1000 UNITS tablet   Commonly known as: VITAMIN D   Take 1,000 Units by mouth daily.      glucose blood test strip   Use as instructed      ibuprofen 800 MG tablet   Commonly known as: ADVIL,MOTRIN   Take 1 tablet (800 mg total) by mouth every 8 (eight) hours as needed for pain.      ipratropium-albuterol 0.5-2.5 (3) MG/3ML Soln   Commonly known as: DUONEB   3 (three) times daily.      albuterol-ipratropium 18-103 MCG/ACT inhaler   Commonly known as: COMBIVENT   Inhale 2 puffs into the lungs every 4 (four) hours as needed. Wheezing  lovastatin 40 MG tablet   Commonly known as: MEVACOR   Take 1 tablet (40 mg total) by mouth at bedtime.      metFORMIN 1000 MG tablet   Commonly known as: GLUCOPHAGE   Take 1 tablet (1,000 mg total) by mouth 2 (two) times daily with a meal.      omeprazole 40 MG capsule   Commonly known as: PRILOSEC   Take 1 capsule (40 mg total) by mouth daily.      oxyCODONE-acetaminophen 5-325 MG per tablet   Commonly known as: PERCOCET   Take 1 tablet by mouth every 4 (four) hours as needed for pain.      oxymetazoline 0.05 % nasal spray   Commonly known as: AFRIN   Place 2 sprays into the nose 2 (two) times daily as needed. Allergies        ranitidine 150 MG tablet   Commonly known as: ZANTAC   Take 150 mg by mouth 2 (two) times daily as needed. Heart burn        repaglinide 2 MG tablet    Commonly known as: PRANDIN   Take 2 mg by mouth 3 (three) times daily as needed. blood glucose- lowering      sertraline 100 MG tablet   Commonly known as: ZOLOFT   Take 100 mg by mouth daily before breakfast.           Follow-up Information    Follow up with GEHRIG,PAOLA A., MD. Schedule an appointment as soon as possible for a visit in 1 week.   Contact information:   [address] [facility] [place] [address] [phone]          Signed: [name] 11/02/2011, 8:40 AM

## 2011-11-02 NOTE — Plan of Care (Signed)
Problem: Phase II Progression Outcomes Goal: Progress activity as tolerated unless otherwise ordered Outcome: Adequate for Discharge Pt up in hallway min of 5 times today

## 2011-11-02 NOTE — Progress Notes (Signed)
Pt walked full length of unit tonight.  Pt c/o severe gas pains at times.  Sitting in rocking chair and turning in bed to try to expel gas.  Simethicone given as ordered.  Pt belching frequently/

## 2011-11-02 NOTE — Progress Notes (Signed)
4 Days Post-Op Procedure(s) (LRB): EXPLORATORY LAPAROTOMY (N/A) HYSTERECTOMY ABDOMINAL (N/A) SALPINGO OOPHERECTOMY (Bilateral) OMENTECTOMY (N/A)  Subjective: Patient reports tolerating PO, + flatus, + BM and no problems voiding.    Objective: I have reviewed patient's vital signs, intake and output, medications, labs, microbiology and pathology.  General: alert and no distress GI: soft, non-tender; bowel sounds normal; no masses,  no organomegaly Extremities: extremities normal, atraumatic, no cyanosis or edema  Assessment: s/p Procedure(s) (LRB): EXPLORATORY LAPAROTOMY (N/A) HYSTERECTOMY ABDOMINAL (N/A) SALPINGO OOPHERECTOMY (Bilateral) OMENTECTOMY (N/A): progressing well and tolerating diet  Plan: Discharge home  LOS: 4 days    [name] [name] A 11/02/2011, 8:23 AM

## 2011-11-02 NOTE — Progress Notes (Signed)
Patient c/o front of gown wet. Mod. amt serosanguineous drainage noted.  Abdominal incision approximated with staples intact. Drainage appears to be coming from the upper to mid portion of staple line. Patient relay she had a paracentesis the Tuesday before surgery with 2Liters of fluid drawn from abd during surgery. Dr. Clearance Coots notified via telephone & relayed to keep area covered with ABD pad and change prn & drainage should stop.

## 2011-11-03 LAB — GLUCOSE, CAPILLARY: Glucose-Capillary: 116 mg/dL — ABNORMAL HIGH (ref 70–99)

## 2011-11-03 MED ORDER — ENOXAPARIN SODIUM 40 MG/0.4ML ~~LOC~~ SOLN: 40.0000 mg | SUBCUTANEOUS | Status: DC

## 2011-11-03 NOTE — Plan of Care (Signed)
Problem: Discharge Progression Outcomes Goal: Other Discharge Outcomes/Goals Outcome: Completed/Met Date Met:  11/03/11 Discharge teaching on self injection of Lovenox complete. Also instructed patient to not take Ibuprofen while on Lovenox per MD order patient  verbalized  understanding.

## 2011-11-03 NOTE — Progress Notes (Signed)
5 Days Post-Op Procedure(s) (LRB): EXPLORATORY LAPAROTOMY (N/A) HYSTERECTOMY ABDOMINAL (N/A) SALPINGO OOPHERECTOMY (Bilateral) OMENTECTOMY (N/A)  Subjective: Patient reports tolerating PO, + flatus, + BM and no problems voiding.    Objective: I have reviewed patient's vital signs, intake and output, medications, labs and pathology.  General: alert and no distress Resp: clear to auscultation bilaterally Cardio: regular rate and rhythm, S1, S2 normal, no murmur, click, rub or gallop GI: soft, non-tender; bowel sounds normal; no masses,  no organomegaly Extremities: extremities normal, atraumatic, no cyanosis or edema  Assessment: s/p Procedure(s) (LRB): EXPLORATORY LAPAROTOMY (N/A) HYSTERECTOMY ABDOMINAL (N/A) SALPINGO OOPHERECTOMY (Bilateral) OMENTECTOMY (N/A): progressing well and tolerating diet  Plan: Discharge home  LOS: 5 days    [name] [name] A 11/03/2011, 8:41 AM

## 2011-11-03 NOTE — Progress Notes (Signed)
Called into patients room to evaluate incision.  Patient with midline abdominal incision with staples ota without drainage.  Small fifty cent piece size pouch noted to top rt side of upper incision.  No drainage noted, no change in size from beginning of shift assessment.  Day RN Carlynn Purl, RN report pouching at this site and size noted at handoff with no change noted at this time.  Per Carlynn Purl, RN Dr Clearance Coots notified of pouching.  No drainage noted at this time.  Patient instructed to call if increase in pain, drainage noted, and/or change in size.  Patient verbalizes understanding.  Call light within reach.

## 2011-11-03 NOTE — Plan of Care (Signed)
Problem: Discharge Progression Outcomes Goal: Discontinue staples (if applicable) Outcome: Not Applicable Date Met:  11/03/11 Removal in week at follow up appointment.

## 2011-11-06 ENCOUNTER — Telehealth: Payer: Self-pay | Admitting: *Deleted

## 2011-11-06 ENCOUNTER — Encounter: Payer: Self-pay | Admitting: *Deleted

## 2011-11-06 NOTE — Telephone Encounter (Signed)
Confirmed 11/28/11 genetics appt w/ pt.

## 2011-11-06 NOTE — Progress Notes (Signed)
Post-operative wound check   Pt in for staple removal.  Doing well at home: eating and drinking, pain well controlled, bladder and bowels working without difficulty. Incision clean, dry and intact. Staples removed and Steri-strips applied.  Reportable signs and symptoms reviewed. Return for follow-up as scheduled.   [name] [name]

## 2011-11-15 ENCOUNTER — Other Ambulatory Visit (HOSPITAL_BASED_OUTPATIENT_CLINIC_OR_DEPARTMENT_OTHER): Payer: Medicare Other | Admitting: Lab

## 2011-11-15 ENCOUNTER — Ambulatory Visit (HOSPITAL_BASED_OUTPATIENT_CLINIC_OR_DEPARTMENT_OTHER): Payer: Medicare Other | Admitting: Oncology

## 2011-11-15 VITALS — BP 112/68 | HR 81 | Temp 97.4°F | Ht 64.0 in | Wt 120.6 lb

## 2011-11-15 DIAGNOSIS — Z853 Personal history of malignant neoplasm of breast: Secondary | ICD-10-CM

## 2011-11-15 DIAGNOSIS — C569 Malignant neoplasm of unspecified ovary: Secondary | ICD-10-CM

## 2011-11-15 DIAGNOSIS — R5381 Other malaise: Secondary | ICD-10-CM

## 2011-11-15 DIAGNOSIS — J449 Chronic obstructive pulmonary disease, unspecified: Secondary | ICD-10-CM

## 2011-11-15 LAB — CBC WITH DIFFERENTIAL/PLATELET
BASO%: 0.8 % (ref 0.0–2.0)
Basophils Absolute: 0.1 10*3/uL (ref 0.0–0.1)
EOS%: 9.6 % — ABNORMAL HIGH (ref 0.0–7.0)
HCT: 33.8 % — ABNORMAL LOW (ref 34.8–46.6)
MCH: 28.3 pg (ref 25.1–34.0)
MCHC: 32.9 g/dL (ref 31.5–36.0)
MCV: 85.9 fL (ref 79.5–101.0)
MONO%: 7.6 % (ref 0.0–14.0)
NEUT%: 59.3 % (ref 38.4–76.8)
RDW: 14.9 % — ABNORMAL HIGH (ref 11.2–14.5)
lymph#: 2.5 10*3/uL (ref 0.9–3.3)
nRBC: 0 % (ref 0–0)

## 2011-11-15 LAB — COMPREHENSIVE METABOLIC PANEL
ALT: 11 U/L (ref 0–35)
CO2: 22 mEq/L (ref 19–32)
Calcium: 9.6 mg/dL (ref 8.4–10.5)
Chloride: 105 mEq/L (ref 96–112)
Creatinine, Ser: 0.75 mg/dL (ref 0.50–1.10)
Sodium: 138 mEq/L (ref 135–145)
Total Protein: 7 g/dL (ref 6.0–8.3)

## 2011-11-15 LAB — CA 125: CA 125: 838.5 U/mL — ABNORMAL HIGH (ref 0.0–30.2)

## 2011-11-17 ENCOUNTER — Other Ambulatory Visit: Payer: Self-pay | Admitting: Oncology

## 2011-11-17 NOTE — Progress Notes (Signed)
ID: BRISEIDY SPARK   DOB: 21-May-1941  MR#: [id number]  CSN#:[id number]  HISTORY OF PRESENT ILLNESS: [name] [name] is a 71 year-old [place] woman I followed remotely for a history of breast cancer. More recently she noted that her belly was getting "bigger". Gradually she has developed increasing low back pain. After a period of several months, as her symptoms increased, she brought this problem to her primary physician's attention and a KUB was obtained on 10/07/2011, which was unremarkable. This was followed by a CT scan 10/15/2011, which showed ascites and omental caking. Paracentesis 10/18/2011 showed ([id number]) malignant cells consistent with metastatic adenocarcinoma, with equivocal staining for estrogen receptor. The pattern of additional stains was most suggestive of a primary gynecologic tumor. CA 125 at this time was 2051.  The patient was referred to gynecologic oncology and on 10/29/2011 underwent exploratory laparotomy under Drs. Cleda Mccreedy and Antionette Char. This consisted of a total abdominal hysterectomy with bilateral salpingo-oophorectomy, omentectomy, and radical tumor debulking. Unfortunately there was significant tumor attached to the diaphragm so the patient was suboptimally debulked. Accordingly a peritoneal catheter was not placed.  The patient is now referred for consideration of adjuvant chemotherapy.  INTERVAL HISTORY: [name] [name] has made an excellent recovery with only mild pain, no constipation, rather occasional loose stools. There's been no dehiscence, erythema, swelling, inflammation, or fever.   REVIEW OF SYSTEMS: Her abdomen is still "full", but less so than previously. She continues to lose weight, was 135 pounds a year ago, his N. and 20 pounds currently. She describes herself as mildly fatigued, has a dry cough and some shortness of breath with vigorous activity, and she does continue to smoke. Her appetite is poor but there is no significant nausea or vomiting.  Back pain is mild and there is some anxiety but no depression or suicidal ideation. She is having mild hot flashes which are not new or increased from baseline. A detailed review of systems was otherwise noncontributory  PAST MEDICAL HISTORY: Past Medical History  Diagnosis Date  . History of breast cancer   . COPD (chronic obstructive pulmonary disease)   . GERD (gastroesophageal reflux disease)   . Diabetes mellitus     type II  . Hx of colonic polyps   . Osteoarthritis   . MVP (mitral valve prolapse)   . Dysrhythmia     pt states runs a little fast   . Shortness of breath     with exertion   . Depression   . Anxiety   . Cancer     left breast  . Heart murmur     PAST SURGICAL HISTORY: Past Surgical History  Procedure Date  . Cesarean section 1980, 1982    X 2   . Laparoscopy     work up for infertility  . Tympanoplasty     left X 2  . Left breast fibroadenoma removal 1960's  . Breast cyst aspirations   . Left modified radial mastectomy   . Laparotomy 10/29/2011    Procedure: EXPLORATORY LAPAROTOMY;  Surgeon: Rejeana Brock A. Duard Brady, MD;  Location: [facility];  Service: Gynecology;  Laterality: N/A;  . Abdominal hysterectomy 10/29/2011    Procedure: HYSTERECTOMY ABDOMINAL;  Surgeon: Rejeana Brock A. Duard Brady, MD;  Location: [facility];  Service: Gynecology;  Laterality: N/A;  Total abdominal hysterectomy  . Salpingoophorectomy 10/29/2011    Procedure: SALPINGO OOPHERECTOMY;  Surgeon: Rejeana Brock A. Duard Brady, MD;  Location: [facility];  Service: Gynecology;  Laterality: Bilateral;    FAMILY HISTORY Family History  Problem  Relation Age of Onset  . Cancer Mother     endometrial/ colon  . Cancer Brother     colon  . COPD Other   . Hypertension Other    the patient's father died at the age of 18. The patient's mother died at the age of 74 she had a history of colon cancer and endometrial cancer. The patient has no sisters. She has one brother, with a history of colon cancer. There is no history of breast  cancer in the family. The patient is scheduled for genetic testing later this month.  GYNECOLOGIC HISTORY: She had menarche age 83. First pregnancy to term was at age 52. She is GX T2. She became menopausal at the time of her chemotherapy for breast cancer. This was age 64. 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 Her husband [name] [name] runs a Psychologist, sport and exercise out of their own home. Daughter [name] [name], 95,  currently lives at home with the patient. There are some issues relating to this daughter that the patient discussed briefly. [place]hter. [name] [name], 30, is a trauma nurse in [place]. The patient has no grandchildren. She is not a Advice worker.   ADVANCED DIRECTIVES: not in place  HEALTH MAINTENANCE: History  Substance Use Topics  . Smoking status: Current Everyday Smoker -- 1.0 packs/day for 30 years    Types: Cigarettes  . Smokeless tobacco: Never Used   Comment: quit again 2009; restarted 1 ppd 2011  . Alcohol Use: No     Colonoscopy: 2009  PAP: s/p hysterectomy  Bone density: 2010. "good"  Mammography: 2012 NOV--unremarkable  Allergies  Allergen Reactions  . Tramadol Other (See Comments)    fainty  . Varenicline Tartrate Other (See Comments)    Crazy dreams     Current Outpatient Prescriptions  Medication Sig Dispense Refill  . acetaminophen (TYLENOL) 325 MG tablet Take 650 mg by mouth every 6 (six) hours as needed.      Marland Kitchen albuterol-ipratropium (COMBIVENT) 18-103 MCG/ACT inhaler Inhale 2 puffs into the lungs every 4 (four) hours as needed. Wheezing  14.7 g  3  . ALPRAZolam (XANAX) 0.5 MG tablet Take 1 tablet (0.5 mg total) by mouth 2 (two) times daily.  60 tablet  5  . atenolol (TENORMIN) 50 MG tablet Take 25 mg by mouth 2 (two) times daily.      Marland Kitchen b complex vitamins tablet Take 1 tablet by mouth daily.        . budesonide-formoterol (SYMBICORT) 160-4.5 MCG/ACT inhaler Inhale 2 puffs into the lungs 2  (two) times daily.  1 Inhaler  5  . buPROPion (WELLBUTRIN SR) 150 MG 12 hr tablet Take 150 mg by mouth daily after breakfast.      . busPIRone (BUSPAR) 15 MG tablet Take 1 tablet (15 mg total) by mouth 2 (two) times daily.  180 tablet  1  . cholecalciferol (VITAMIN D) 1000 UNITS tablet Take 1,000 Units by mouth daily.        . diphenhydrAMINE (SOMINEX) 25 MG tablet Take 25 mg by mouth at bedtime as needed.      . enoxaparin (LOVENOX) 40 MG/0.4ML injection Inject 0.4 mLs (40 mg total) into the skin daily.  14 Syringe  0  . glucose blood (ONE TOUCH TEST STRIPS) test strip Use as instructed  100 each  3  . ipratropium-albuterol (DUONEB) 0.5-2.5 (3) MG/3ML SOLN 3 (three) times daily.       Marland Kitchen lovastatin (MEVACOR)  40 MG tablet Take 1 tablet (40 mg total) by mouth at bedtime.  90 tablet  2  . metFORMIN (GLUCOPHAGE) 1000 MG tablet Take 1 tablet (1,000 mg total) by mouth 2 (two) times daily with a meal.  180 tablet  3  . omeprazole (PRILOSEC) 40 MG capsule Take 1 capsule (40 mg total) by mouth daily.  90 capsule  3  . oxymetazoline (AFRIN) 0.05 % nasal spray Place 2 sprays into the nose 2 (two) times daily as needed. Allergies       . ranitidine (ZANTAC) 150 MG tablet Take 150 mg by mouth 2 (two) times daily as needed. Heart burn       . repaglinide (PRANDIN) 2 MG tablet Take 2 mg by mouth 3 (three) times daily as needed. blood glucose- lowering      . sertraline (ZOLOFT) 100 MG tablet Take 100 mg by mouth daily before breakfast.      . aspirin 81 MG tablet Take 81 mg by mouth every evening.       Marland Kitchen oxyCODONE-acetaminophen (ROXICET) 5-325 MG per tablet Take 1 tablet by mouth every 4 (four) hours as needed for pain.  40 tablet  0  . DISCONTD: roflumilast (DALIRESP) 500 MCG TABS tablet Take 500 mcg by mouth daily.          OBJECTIVE: Middle-aged white woman who appears comfortable. Her husband is present during the exam. Filed Vitals:   11/15/11 1100  BP: 112/68  Pulse: 81  Temp: 97.4 F (36.3 C)      Body mass index is 20.70 kg/(m^2).    ECOG FS: 1  Sclerae unicteric Oropharynx clear No peripheral adenopathy Lungs no rales or rhonchi Heart regular rate and rhythm Abd soft, nontender, positive bowel sounds, no masses palpated; the abdominal incision is healing very nicely, with no dehiscence, no significant induration, minimal erythema in the superior aspect focally MSK no focal spinal tenderness, no peripheral edema Neuro: nonfocal Breasts: Left breast is status post modified radical mastectomy; there is no evidence of local recurrence. Right breast is unremarkable  LAB RESULTS: Lab Results  Component Value Date   WBC 11.0* 11/15/2011   NEUTROABS 6.6* 11/15/2011   HGB 11.1* 11/15/2011   HCT 33.8* 11/15/2011   MCV 85.9 11/15/2011   PLT 560* 11/15/2011      Chemistry      Component Value Date/Time   NA 138 11/15/2011 1040   K 5.4* 11/15/2011 1040   CL 105 11/15/2011 1040   CO2 22 11/15/2011 1040   BUN 18 11/15/2011 1040   CREATININE 0.75 11/15/2011 1040      Component Value Date/Time   CALCIUM 9.6 11/15/2011 1040   ALKPHOS 79 11/15/2011 1040   AST 12 11/15/2011 1040   ALT 11 11/15/2011 1040   BILITOT 0.3 11/15/2011 1040       No results found for this basename: LABCA2    CA 125 is currently 839    No results found for this basename: INR:1;PROTIME:1 in the last 168 hours  Urinalysis    Component Value Date/Time   COLORURINE LT. YELLOW 08/30/2010 0923   APPEARANCEUR CLEAR 08/30/2010 0923   LABSPEC 1.010 08/30/2010 0923   PHURINE 7.0 08/30/2010 0923   BILIRUBINUR NEGATIVE 08/30/2010 0923   KETONESUR NEGATIVE 08/30/2010 0923   UROBILINOGEN 0.2 08/30/2010 0923   NITRITE NEGATIVE 08/30/2010 0923   LEUKOCYTESUR NEGATIVE 08/30/2010 0923    STUDIES: Chest 2 View  10/24/2011  *RADIOLOGY REPORT*  Clinical Data: Preoperative evaluation.  Recent  diagnosis of ovarian cancer.  Prior history of breast cancer with mastectomy in 1993.  30 pack year history of smoking with COPD  CHEST - 2  VIEW  Comparison: 01/17/2010  Findings: The patient is status post left mastectomy and left axillary node dissection.  Hyperinflation is stable with underlying COPD and prominence of the interstitial markings correlating with underlying bronchitic change.  The cardiomediastinal silhouette is stable with heart size upper limits of normal.  The lung fields appear clear with no signs of focal infiltrate or congestive failure.  No pleural fluid or significant peribronchial cuffing is identified.  No focal nodularity is seen to suggest the presence of metastatic disease.  Bony structures appear intact.  IMPRESSION: Stable COPD changes.  No new worrisome focal or acute abnormality identified.  Original Report Authenticated By: Bertha Stakes, M.D.    ASSESSMENT: 71 year old [place] woman  (1) status post left modified radical mastectomy remotely (it we will try to retrieve those records), treated adjuvantly with CMF chemotherapy, with no evidence of recurrence  (2) now status post TAH BSO, omentectomy, and suboptimal debulking 10/29/2011 for a high-grade serous adenocarcinoma of the ovary, pT3c NX (Stage IIIC)  (3) COPD with ongoing tobacco abuse  (4) diabetes mellitus  PLAN: The is making a good postoperative recovery. She understands that ovarian cancer is not curative with surgery, and that the chance of cure  with optimal chemotherapy is low, the majority of patients relapsing at some point, the typical course being one of multiple relapses and remissions. Standard chemotherapy would consist of carboplatin and paclitaxel and that is what we discussed today. She has a good understanding of the possible toxicities side effects and complications and because she has some important events scheduled for early June, the best time to start will be April 30.  She has a good understanding of the possible toxicities side effects and complications of treatment. She will also come to chemotherapy school  for further orientation. She has been scheduled for Port-A-Cath placement. She has already been set up for genetic testing and she understands she is likely to carry a mutation which might affect her daughters. Laurelle is very eager to get started. I strongly encouraged her to discontinue smoking during her chemotherapy to optimize results   Kelechi Astarita C    11/17/2011

## 2011-11-18 ENCOUNTER — Other Ambulatory Visit: Payer: Self-pay | Admitting: Oncology

## 2011-11-18 ENCOUNTER — Telehealth: Payer: Self-pay | Admitting: Oncology

## 2011-11-18 DIAGNOSIS — C569 Malignant neoplasm of unspecified ovary: Secondary | ICD-10-CM

## 2011-11-18 NOTE — Telephone Encounter (Signed)
S/w the pt regarding rescheduling her chemo educ class and to pick up her appt calendars for her next md and tx appts

## 2011-11-19 ENCOUNTER — Other Ambulatory Visit: Payer: Self-pay | Admitting: Physician Assistant

## 2011-11-19 ENCOUNTER — Encounter: Payer: Self-pay | Admitting: Internal Medicine

## 2011-11-19 ENCOUNTER — Ambulatory Visit (INDEPENDENT_AMBULATORY_CARE_PROVIDER_SITE_OTHER): Payer: Medicare Other | Admitting: Internal Medicine

## 2011-11-19 VITALS — BP 98/58 | HR 97 | Temp 98.7°F | Resp 16 | Wt 121.0 lb

## 2011-11-19 DIAGNOSIS — E559 Vitamin D deficiency, unspecified: Secondary | ICD-10-CM

## 2011-11-19 DIAGNOSIS — F329 Major depressive disorder, single episode, unspecified: Secondary | ICD-10-CM

## 2011-11-19 DIAGNOSIS — R209 Unspecified disturbances of skin sensation: Secondary | ICD-10-CM

## 2011-11-19 DIAGNOSIS — K219 Gastro-esophageal reflux disease without esophagitis: Secondary | ICD-10-CM

## 2011-11-19 DIAGNOSIS — R202 Paresthesia of skin: Secondary | ICD-10-CM

## 2011-11-19 DIAGNOSIS — E119 Type 2 diabetes mellitus without complications: Secondary | ICD-10-CM

## 2011-11-19 DIAGNOSIS — J449 Chronic obstructive pulmonary disease, unspecified: Secondary | ICD-10-CM

## 2011-11-19 DIAGNOSIS — K668 Other specified disorders of peritoneum: Secondary | ICD-10-CM

## 2011-11-19 NOTE — Assessment & Plan Note (Signed)
Continue with current prescription therapy as reflected on the Med list.  

## 2011-11-19 NOTE — Progress Notes (Signed)
Patient ID: [name] [name], female   DOB: August 22, 1940, 71 y.o.   MRN: [id number]  Subjective:    Patient ID: [name] [name], female    DOB: [birth year], 71 y.o.   MRN: [id number]  HPI  F/u abd tumor and ascitis - s/p debulking No new abd pain  Wt Readings from Last 3 Encounters:  11/19/11 121 lb (54.885 kg)  11/15/11 120 lb 9.6 oz (54.704 kg)  10/29/11 134 lb (60.782 kg)   BP Readings from Last 3 Encounters:  11/19/11 98/58  11/15/11 112/68  11/03/11 107/63      Review of Systems  Constitutional: Positive for fatigue and unexpected weight change. Negative for chills, activity change and appetite change.  HENT: Negative for congestion, mouth sores and sinus pressure.   Eyes: Negative for visual disturbance.  Respiratory: Positive for shortness of breath. Negative for cough and chest tightness.   Gastrointestinal: Positive for diarrhea and abdominal distention. Negative for nausea and abdominal pain.  Genitourinary: Negative for frequency, difficulty urinating and vaginal pain.  Musculoskeletal: Negative for back pain and gait problem.  Skin: Negative for pallor and rash.  Neurological: Negative for dizziness, tremors, weakness, numbness and headaches.  Psychiatric/Behavioral: Negative for confusion and sleep disturbance.       Objective:   Physical Exam  Constitutional: She appears well-developed. No distress.  HENT:  Head: Normocephalic.  Right Ear: External ear normal.  Left Ear: External ear normal.  Nose: Nose normal.  Mouth/Throat: Oropharynx is clear and moist.  Eyes: Conjunctivae are normal. Pupils are equal, round, and reactive to light. Right eye exhibits no discharge. Left eye exhibits no discharge.  Neck: Normal range of motion. Neck supple. No JVD present. No tracheal deviation present. No thyromegaly present.  Cardiovascular: Normal rate, regular rhythm and normal heart sounds.   Pulmonary/Chest: No stridor. No respiratory distress. She has no wheezes.    Abdominal: Soft. Bowel sounds are normal. She exhibits distension. She exhibits no mass. There is no tenderness. There is no rebound and no guarding.  Musculoskeletal: She exhibits no edema and no tenderness.  Lymphadenopathy:    She has no cervical adenopathy.  Neurological: She displays normal reflexes. No cranial nerve deficit. She exhibits normal muscle tone. Coordination normal.  Skin: No rash noted. No erythema.  Psychiatric: She has a normal mood and affect. Her behavior is normal. Judgment and thought content normal.   Lab Results  Component Value Date   WBC 11.0* 11/15/2011   HGB 11.1* 11/15/2011   HCT 33.8* 11/15/2011   PLT 560* 11/15/2011   GLUCOSE 179* 11/15/2011   CHOL 170 08/30/2010   TRIG 105.0 08/30/2010   HDL 68.00 08/30/2010   LDLDIRECT 108.0 01/16/2010   LDLCALC 81 08/30/2010   ALT 11 11/15/2011   AST 12 11/15/2011   NA 138 11/15/2011   K 5.4* 11/15/2011   CL 105 11/15/2011   CREATININE 0.75 11/15/2011   BUN 18 11/15/2011   CO2 22 11/15/2011   TSH 3.30 08/30/2010   INR 1.0 04/25/2008   HGBA1C 7.5* 10/04/2011   MICROALBUR 1.0 08/30/2010   CT  CA125 2050.7  surg report       Assessment & Plan:

## 2011-11-19 NOTE — Assessment & Plan Note (Signed)
Continue with current prescription therapy as reflected on the Med list. If low CBG - cut back on metformin

## 2011-11-19 NOTE — Assessment & Plan Note (Signed)
3/13; 4/13 - debulked

## 2011-11-20 ENCOUNTER — Encounter (HOSPITAL_COMMUNITY): Payer: Self-pay | Admitting: Pharmacy Technician

## 2011-11-20 ENCOUNTER — Telehealth (HOSPITAL_COMMUNITY): Payer: Self-pay | Admitting: Oncology

## 2011-11-21 ENCOUNTER — Other Ambulatory Visit: Payer: Medicare Other

## 2011-11-21 ENCOUNTER — Encounter (HOSPITAL_COMMUNITY): Payer: Self-pay

## 2011-11-21 ENCOUNTER — Other Ambulatory Visit: Payer: Self-pay | Admitting: Oncology

## 2011-11-21 ENCOUNTER — Telehealth: Payer: Self-pay | Admitting: *Deleted

## 2011-11-21 ENCOUNTER — Ambulatory Visit (HOSPITAL_COMMUNITY)
Admission: RE | Admit: 2011-11-21 | Discharge: 2011-11-21 | Disposition: A | Discharge status: Home / Self Care | Payer: Medicare Other | Source: Ambulatory Visit | Attending: Oncology | Admitting: Oncology

## 2011-11-21 DIAGNOSIS — C569 Malignant neoplasm of unspecified ovary: Secondary | ICD-10-CM

## 2011-11-21 DIAGNOSIS — Z79899 Other long term (current) drug therapy: Secondary | ICD-10-CM | POA: Insufficient documentation

## 2011-11-21 DIAGNOSIS — E119 Type 2 diabetes mellitus without complications: Secondary | ICD-10-CM | POA: Insufficient documentation

## 2011-11-21 LAB — PROTIME-INR: Prothrombin Time: 12.7 seconds (ref 11.6–15.2)

## 2011-11-21 LAB — CBC
Platelets: 488 10*3/uL — ABNORMAL HIGH (ref 150–400)
RBC: 4.24 MIL/uL (ref 3.87–5.11)
WBC: 10.4 10*3/uL (ref 4.0–10.5)

## 2011-11-21 MED ORDER — SODIUM CHLORIDE 0.9 % IV SOLN
INTRAVENOUS | Status: DC
  Administered 2011-11-21: 13:00:00 via INTRAVENOUS

## 2011-11-21 MED ORDER — MIDAZOLAM HCL 2 MG/2ML IJ SOLN
INTRAMUSCULAR | Status: AC
  Filled 2011-11-21: qty 2

## 2011-11-21 MED ORDER — HEPARIN SOD (PORK) LOCK FLUSH 100 UNIT/ML IV SOLN
INTRAVENOUS | Status: AC | PRN
  Administered 2011-11-21: 500 [IU]

## 2011-11-21 MED ORDER — FENTANYL CITRATE 0.05 MG/ML IJ SOLN
INTRAMUSCULAR | Status: AC
  Filled 2011-11-21: qty 6

## 2011-11-21 MED ORDER — MIDAZOLAM HCL 2 MG/2ML IJ SOLN
INTRAMUSCULAR | Status: AC
  Filled 2011-11-21: qty 4

## 2011-11-21 MED ORDER — FENTANYL CITRATE 0.05 MG/ML IJ SOLN
INTRAMUSCULAR | Status: AC | PRN
  Administered 2011-11-21 (×2): 50 ug via INTRAVENOUS
  Administered 2011-11-21: 100 ug via INTRAVENOUS

## 2011-11-21 MED ORDER — CEFAZOLIN SODIUM 1-5 GM-% IV SOLN
INTRAVENOUS | Status: AC
  Filled 2011-11-21: qty 50

## 2011-11-21 MED ORDER — LIDOCAINE HCL 1 % IJ SOLN
INTRAMUSCULAR | Status: AC
  Filled 2011-11-21: qty 20

## 2011-11-21 MED ORDER — MIDAZOLAM HCL 5 MG/5ML IJ SOLN
INTRAMUSCULAR | Status: AC | PRN
  Administered 2011-11-21 (×3): 1 mg via INTRAVENOUS

## 2011-11-21 MED ORDER — CEFAZOLIN SODIUM 1-5 GM-% IV SOLN: 1.0000 g | Freq: Once | INTRAVENOUS | Status: DC

## 2011-11-21 NOTE — Telephone Encounter (Signed)
gave patient appointment for ir port a cath placement made patient appointments for 11-2011 thru 03-2012

## 2011-11-21 NOTE — Procedures (Signed)
Placement of right IJ portacath.  Tip at cavoatrial junction. No immediate complication.

## 2011-11-21 NOTE — H&P (Signed)
[name] [name] is an 71 y.o. female.   Chief Complaint: Ovarian Ca; Hx Breast Ca HPI: scheduled now for [facility] a Cath placement for chemo  Past Medical History  Diagnosis Date  . History of breast cancer   . COPD (chronic obstructive pulmonary disease)   . GERD (gastroesophageal reflux disease)   . Diabetes mellitus     type II  . Hx of colonic polyps   . Osteoarthritis   . MVP (mitral valve prolapse)   . Dysrhythmia     pt states runs a little fast   . Shortness of breath     with exertion   . Depression   . Anxiety   . Cancer     left breast  . Heart murmur     Past Surgical History  Procedure Date  . Cesarean section 1980, 1982    X 2   . Laparoscopy     work up for infertility  . Tympanoplasty     left X 2  . Left breast fibroadenoma removal 1960's  . Breast cyst aspirations   . Left modified radial mastectomy   . Laparotomy 10/29/2011    Procedure: EXPLORATORY LAPAROTOMY;  Surgeon: Rejeana Brock A. Duard Brady, MD;  Location: [facility];  Service: Gynecology;  Laterality: N/A;  . Abdominal hysterectomy 10/29/2011    Procedure: HYSTERECTOMY ABDOMINAL;  Surgeon: Rejeana Brock A. Duard Brady, MD;  Location: [facility];  Service: Gynecology;  Laterality: N/A;  Total abdominal hysterectomy  . Salpingoophorectomy 10/29/2011    Procedure: SALPINGO OOPHERECTOMY;  Surgeon: Rejeana Brock A. Duard Brady, MD;  Location: [facility];  Service: Gynecology;  Laterality: Bilateral;    Family History  Problem Relation Age of Onset  . Cancer Mother     endometrial/ colon  . Cancer Brother     colon  . COPD Other   . Hypertension Other    Social History:  reports that she has been smoking Cigarettes.  She has a 30 pack-year smoking history. She has never used smokeless tobacco. She reports that she does not drink alcohol or use illicit drugs.  Allergies: No Known Allergies  Medications Prior to Admission  Medication Sig Dispense Refill  . acetaminophen (TYLENOL) 325 MG tablet Take 650 mg by mouth every 6 (six) hours as needed.  For pain      . albuterol-ipratropium (COMBIVENT) 18-103 MCG/ACT inhaler Inhale 2 puffs into the lungs every 4 (four) hours as needed. For wheezing      . ALPRAZolam (XANAX) 0.5 MG tablet Take 1 tablet (0.5 mg total) by mouth 2 (two) times daily.  60 tablet  5  . aspirin EC 81 MG tablet Take 81 mg by mouth daily.      Marland Kitchen atenolol (TENORMIN) 50 MG tablet Take 25 mg by mouth 2 (two) times daily.      Marland Kitchen b complex vitamins tablet Take 1 tablet by mouth daily.        . budesonide-formoterol (SYMBICORT) 160-4.5 MCG/ACT inhaler Inhale 2 puffs into the lungs 2 (two) times daily.  1 Inhaler  5  . buPROPion (WELLBUTRIN SR) 150 MG 12 hr tablet Take 150 mg by mouth daily after breakfast.      . busPIRone (BUSPAR) 15 MG tablet Take 1 tablet (15 mg total) by mouth 2 (two) times daily.  180 tablet  1  . cholecalciferol (VITAMIN D) 1000 UNITS tablet Take 1,000 Units by mouth daily.        . diphenhydrAMINE (SOMINEX) 25 MG tablet Take 25 mg by mouth at  bedtime as needed. For sleep      . glucose blood (ONE TOUCH TEST STRIPS) test strip Use as instructed  100 each  3  . ipratropium-albuterol (DUONEB) 0.5-2.5 (3) MG/3ML SOLN Inhale 3 mLs into the lungs 3 (three) times daily.       Marland Kitchen lovastatin (MEVACOR) 40 MG tablet Take 1 tablet (40 mg total) by mouth at bedtime.  90 tablet  2  . metFORMIN (GLUCOPHAGE) 1000 MG tablet Take 1 tablet (1,000 mg total) by mouth 2 (two) times daily with a meal.  180 tablet  3  . omeprazole (PRILOSEC) 40 MG capsule Take 1 capsule (40 mg total) by mouth daily.  90 capsule  3  . oxyCODONE-acetaminophen (ROXICET) 5-325 MG per tablet Take 1 tablet by mouth every 4 (four) hours as needed for pain.  40 tablet  0  . oxymetazoline (AFRIN) 0.05 % nasal spray Place 2 sprays into the nose 2 (two) times daily as needed. Allergies       . ranitidine (ZANTAC) 150 MG tablet Take 150 mg by mouth 2 (two) times daily as needed. Heart burn       . repaglinide (PRANDIN) 2 MG tablet Take 2 mg by mouth 3  (three) times daily as needed. blood glucose- lowering      . sertraline (ZOLOFT) 100 MG tablet Take 100 mg by mouth daily before breakfast.      . DISCONTD: roflumilast (DALIRESP) 500 MCG TABS tablet Take 500 mcg by mouth daily.         Medications Prior to Admission  Medication Dose Route Frequency Provider Last Rate Last Dose  . 0.9 %  sodium chloride infusion   Intravenous Continuous Abundio Miu, MD      . ceFAZolin (ANCEF) 1-5 GM-% IVPB           . ceFAZolin (ANCEF) IVPB 1 g/50 mL premix  1 g Intravenous Once Abundio Miu, MD      . fentaNYL (SUBLIMAZE) 0.05 MG/ML injection           . lidocaine (XYLOCAINE) 1 % (with pres) injection           . midazolam (VERSED) 2 MG/2ML injection           . midazolam (VERSED) 2 MG/2ML injection             Results for orders placed during the hospital encounter of 11/21/11 (from the past 48 hour(s))  CBC     Status: Abnormal   Collection Time   11/21/11 12:30 PM      Component Value Range Comment   WBC 10.4  4.0 - 10.5 (K/uL)    RBC 4.24  3.87 - 5.11 (MIL/uL)    Hemoglobin 12.0  12.0 - 15.0 (g/dL)    HCT 16.1 (*) 09.6 - 46.0 (%)    MCV 83.7  78.0 - 100.0 (fL)    MCH 28.3  26.0 - 34.0 (pg)    MCHC 33.8  30.0 - 36.0 (g/dL)    RDW 04.5  40.9 - 81.1 (%)    Platelets 488 (*) 150 - 400 (K/uL)   PROTIME-INR     Status: Normal   Collection Time   11/21/11 12:30 PM      Component Value Range Comment   Prothrombin Time 12.7  11.6 - 15.2 (seconds)    INR 0.93  0.00 - 1.49     No results found.  Review of Systems  Constitutional: Negative for fever.  Respiratory:  Negative for cough.   Cardiovascular: Negative for chest pain.  Gastrointestinal: Negative for nausea and vomiting.  Neurological: Negative for headaches.    Blood pressure 108/62, pulse 89, temperature 98.4 F (36.9 C), resp. rate 14, height 5\' 4"  (1.626 m), weight 121 lb (54.885 kg), SpO2 95.00%. Physical Exam  Constitutional: She appears well-developed and well-nourished.    Cardiovascular: Normal rate, regular rhythm and normal heart sounds.   No murmur heard. Respiratory: Effort normal and breath sounds normal. She has no wheezes.  GI: Soft. Bowel sounds are normal. There is no tenderness.  Musculoskeletal: Normal range of motion.  Neurological: She is alert.  Skin: Skin is warm.  Psychiatric: She has a normal mood and affect. Her behavior is normal. Judgment and thought content normal.     Assessment/Plan Ovarian Ca Hx Breast Ca; smoker Scheduled for PAC placement for chemo Pt aware of procedure benefits and risks and agreeable to proceed. Consent in chart  Alwaleed Obeso A 11/21/2011, 1:02 PM

## 2011-11-21 NOTE — Discharge Instructions (Signed)
Implanted Port Instructions  An implanted port is a central line that has a round shape and is placed under the skin. It is used for long-term IV (intravenous) access for:   Medicine.   Fluids.   Liquid nutrition, such as TPN (total parenteral nutrition).   Blood samples.  Ports can be placed:   In the chest area just below the collarbone (this is the most common place.)   In the arms.   In the belly (abdomen) area.   In the legs.  PARTS OF THE PORT  A port has 2 main parts:   The reservoir. The reservoir is round, disc-shaped, and will be a small, raised area under your skin.   The reservoir is the part where a needle is inserted (accessed) to either give medicines or to draw blood.   The catheter. The catheter is a long, slender tube that extends from the reservoir. The catheter is placed into a large vein.   Medicine that is inserted into the reservoir goes into the catheter and then into the vein.  INSERTION OF THE PORT   The port is surgically placed in either an operating room or in a procedural area (interventional radiology).   Medicine may be given to help you relax during the procedure.   The skin where the port will be inserted is numbed (local anesthetic).   1 or 2 small cuts (incisions) will be made in the skin to insert the port.   The port can be used after it has been inserted.  INCISION SITE CARE   The incision site may have small adhesive strips on it. This helps keep the incision site closed. Sometimes, no adhesive strips are placed. Instead of adhesive strips, a special kind of surgical glue is used to keep the incision closed.   If adhesive strips were placed on the incision sites, do not take them off. They will fall off on their own.   The incision site may be sore for 1 to 2 days. Pain medicine can help.   Do not get the incision site wet. Bathe or shower as directed by your caregiver.   The incision site should heal in 5 to 7 days. A small scar may form after the  incision has healed.  ACCESSING THE PORT  Special steps must be taken to access the port:   Before the port is accessed, a numbing cream can be placed on the skin. This helps numb the skin over the port site.   A sterile technique is used to access the port.   The port is accessed with a needle. Only "non-coring" port needles should be used to access the port. Once the port is accessed, a blood return should be checked. This helps ensure the port is in the vein and is not clogged (clotted).   If your caregiver believes your port should remain accessed, a clear (transparent) bandage will be placed over the needle site. The bandage and needle will need to be changed every week or as directed by your caregiver.   Keep the bandage covering the needle clean and dry. Do not get it wet. Follow your caregiver's instructions on how to take a shower or bath when the port is accessed.   If your port does not need to stay accessed, no bandage is needed over the port.  FLUSHING THE PORT  Flushing the port keeps it from getting clogged. How often the port is flushed depends on:   If a   constant infusion is running. If a constant infusion is running, the port may not need to be flushed.   If intermittent medicines are given.   If the port is not being used.  For intermittent medicines:   The port will need to be flushed:   After medicines have been given.   After blood has been drawn.   As part of routine maintenance.   A port is normally flushed with:   Normal saline.   Heparin.   Follow your caregiver's advice on how often, how much, and the type of flush to use on your port.  IMPORTANT PORT INFORMATION   Tell your caregiver if you are allergic to heparin.   After your port is placed, you will get a manufacturer's information card. The card has information about your port. Keep this card with you at all times.   There are many types of ports available. Know what kind of port you have.   In case of an  emergency, it may be helpful to wear a medical alert bracelet. This can help alert health care workers that you have a port.   The port can stay in for as long as your caregiver believes it is necessary.   When it is time for the port to come out, surgery will be done to remove it. The surgery will be similar to how the port was put in.   If you are in the hospital or clinic:   Your port will be taken care of and flushed by a nurse.   If you are at home:   A home health care nurse may give medicines and take care of the port.   You or a family member can get special training and directions for giving medicine and taking care of the port at home.  SEEK IMMEDIATE MEDICAL CARE IF:    Your port does not flush or you are unable to get a blood return.   New drainage or pus is coming from the incision.   A bad smell is coming from the incision site.   You develop swelling or increased redness at the incision site.   You develop increased swelling or pain at the port site.   You develop swelling or pain in the surrounding skin near the port.   You have an oral temperature above 102 F (38.9 C), not controlled by medicine.  MAKE SURE YOU:    Understand these instructions.   Will watch your condition.   Will get help right away if you are not doing well or get worse.  Document Released: 07/22/2005 Document Revised: 07/11/2011 Document Reviewed: 10/13/2008  [employer] Patient Information 2012 [employer]          Moderate Sedation, Adult  Moderate sedation is given to help you relax or even sleep through a procedure. You may remain sleepy, be clumsy, or have poor balance for several hours following this procedure. Arrange for a responsible adult, family member, or friend to take you home. A responsible adult should stay with you for at least 24 hours or until the medicines have worn off.   Do not participate in any activities where you could become injured for the next 24 hours, or until you feel normal  again. Do not:   Drive.   Swim.   Ride a bicycle.   Operate heavy machinery.   Cook.   Use power tools.   Climb ladders.   Work at heights.   Do not   make important decisions or sign legal documents until you are improved.   Vomiting may occur if you eat too soon. When you can drink without vomiting, try water, juice, or soup. Try solid foods if you feel little or no nausea.   Only take over-the-counter or prescription medications for pain, discomfort, or fever as directed by your caregiver.If pain medications have been prescribed for you, ask your caregiver how soon it is safe to take them.   Make sure you and your family fully understands everything about the medication given to you. Make sure you understand what side effects may occur.   You should not drink alcohol, take sleeping pills, or medications that cause drowsiness for at least 24 hours.   If you smoke, do not smoke alone.   If you are feeling better, you may resume normal activities 24 hours after receiving sedation.   Keep all appointments as scheduled. Follow all instructions.   Ask questions if you do not understand.  SEEK MEDICAL CARE IF:    Your skin is pale or bluish in color.   You continue to feel sick to your stomach (nauseous) or throw up (vomit).   Your pain is getting worse and not helped by medication.   You have bleeding or swelling.   You are still sleepy or feeling clumsy after 24 hours.  SEEK IMMEDIATE MEDICAL CARE IF:    You develop a rash.   You have difficulty breathing.   You develop any type of allergic problem.   You have a fever.  Document Released: 04/16/2001 Document Revised: 07/11/2011 Document Reviewed: 09/07/2007  [employer] Patient Information 2012 [employer]

## 2011-11-24 ENCOUNTER — Encounter: Payer: Self-pay | Admitting: Internal Medicine

## 2011-11-26 ENCOUNTER — Encounter: Payer: Self-pay | Admitting: *Deleted

## 2011-11-26 ENCOUNTER — Other Ambulatory Visit: Payer: Medicare Other

## 2011-11-28 ENCOUNTER — Encounter: Payer: Medicare Other | Admitting: Genetic Counselor

## 2011-11-29 ENCOUNTER — Other Ambulatory Visit: Payer: Self-pay | Admitting: *Deleted

## 2011-11-29 DIAGNOSIS — C569 Malignant neoplasm of unspecified ovary: Secondary | ICD-10-CM

## 2011-12-02 ENCOUNTER — Ambulatory Visit (HOSPITAL_BASED_OUTPATIENT_CLINIC_OR_DEPARTMENT_OTHER): Payer: Medicare Other | Admitting: Physician Assistant

## 2011-12-02 ENCOUNTER — Other Ambulatory Visit: Payer: Self-pay | Admitting: *Deleted

## 2011-12-02 ENCOUNTER — Other Ambulatory Visit: Payer: Medicare Other | Admitting: Lab

## 2011-12-02 ENCOUNTER — Encounter: Payer: Self-pay | Admitting: Physician Assistant

## 2011-12-02 ENCOUNTER — Telehealth: Payer: Self-pay | Admitting: Oncology

## 2011-12-02 VITALS — BP 123/71 | HR 54 | Temp 98.1°F | Ht 64.0 in | Wt 119.2 lb

## 2011-12-02 DIAGNOSIS — C569 Malignant neoplasm of unspecified ovary: Secondary | ICD-10-CM

## 2011-12-02 LAB — CBC WITH DIFFERENTIAL/PLATELET
Eosinophils Absolute: 0.7 10*3/uL — ABNORMAL HIGH (ref 0.0–0.5)
MCV: 86 fL (ref 79.5–101.0)
MONO#: 0.7 10*3/uL (ref 0.1–0.9)
MONO%: 6.7 % (ref 0.0–14.0)
NEUT#: 7 10*3/uL — ABNORMAL HIGH (ref 1.5–6.5)
RBC: 4.13 10*6/uL (ref 3.70–5.45)
RDW: 15.3 % — ABNORMAL HIGH (ref 11.2–14.5)
WBC: 10.9 10*3/uL — ABNORMAL HIGH (ref 3.9–10.3)
nRBC: 0 % (ref 0–0)

## 2011-12-02 LAB — CA 125: CA 125: 496.1 U/mL — ABNORMAL HIGH (ref 0.0–30.2)

## 2011-12-02 LAB — COMPREHENSIVE METABOLIC PANEL
ALT: 21 U/L (ref 0–35)
AST: 17 U/L (ref 0–37)
CO2: 24 mEq/L (ref 19–32)
Sodium: 137 mEq/L (ref 135–145)
Total Bilirubin: 0.2 mg/dL — ABNORMAL LOW (ref 0.3–1.2)
Total Protein: 7.6 g/dL (ref 6.0–8.3)

## 2011-12-02 MED ORDER — ONDANSETRON HCL 8 MG PO TABS: ORAL_TABLET | ORAL | Status: DC

## 2011-12-02 MED ORDER — PROCHLORPERAZINE MALEATE 10 MG PO TABS: 10.0000 mg | ORAL_TABLET | Freq: Four times a day (QID) | ORAL | Status: DC | PRN

## 2011-12-02 MED ORDER — LIDOCAINE-PRILOCAINE 2.5-2.5 % EX CREA: TOPICAL_CREAM | CUTANEOUS | Status: DC | PRN

## 2011-12-02 MED ORDER — DEXAMETHASONE 4 MG PO TABS: ORAL_TABLET | ORAL | Status: DC

## 2011-12-02 NOTE — Progress Notes (Signed)
ID: TAHIRY SPICER   DOB: 1940-12-21  MR#: [id number]  CSN#:[id number]  HISTORY OF PRESENT ILLNESS: Fletcher is a 71 year-old [place] woman I followed remotely for a history of breast cancer. More recently she noted that her belly was getting "bigger". Gradually she has developed increasing low back pain. After a period of several months, as her symptoms increased, she brought this problem to her primary physician's attention and a KUB was obtained on 10/07/2011, which was unremarkable. This was followed by a CT scan 10/15/2011, which showed ascites and omental caking. Paracentesis 10/18/2011 showed ([id number]) malignant cells consistent with metastatic adenocarcinoma, with equivocal staining for estrogen receptor. The pattern of additional stains was most suggestive of a primary gynecologic tumor. CA 125 at this time was 2051.  The patient was referred to gynecologic oncology and on 10/29/2011 underwent exploratory laparotomy under Drs. Cleda Mccreedy and Antionette Char. This consisted of a total abdominal hysterectomy with bilateral salpingo-oophorectomy, omentectomy, and radical tumor debulking. Unfortunately there was significant tumor attached to the diaphragm so the patient was suboptimally debulked. Accordingly a peritoneal catheter was not placed.  The patient is to be treated with 6 cycles of carboplatin/paclitaxel given every 3 weeks, with Neulasta on day 2 for granulocyte support.  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 She is ready to initiate adjuvant chemotherapy consisting of carboplatin/paclitaxel given every 3 weeks x6 cycles. She will be receiving Neulasta on day 2 for granulocyte support.  Since her last appointment here, Manessa had her port placed with no complications. She attended chemotherapy school. She has had some stressful situations at home with their oldest daughter, but she  seems to be coping with things very appropriately. She is ready to proceed with therapy.  REVIEW OF SYSTEMS:  Mardene Celeste is a little nervous and anxious, but no depression, and she denies suicidal ideations. She's had no recent fevers, chills, or night sweats, although she does have hot flashes. She has no nausea, diarrhea, or constipation. No abnormal bleeding. Her abdomen no longer fills bloated or "full" and she denies any abdominal or pelvic pain.she has an occasional cough which is not new but denies increased shortness of breath. No abnormal headaches and no unusual myalgias arthralgias or bony pain. At baseline, she also denies any peripheral neuropathy.  Otherwise a detailed review of systems is noncontributory.  PAST MEDICAL HISTORY: Past Medical History  Diagnosis Date  . History of breast cancer   . COPD (chronic obstructive pulmonary disease)   . GERD (gastroesophageal reflux disease)   . Diabetes mellitus     type II  . Hx of colonic polyps   . Osteoarthritis   . MVP (mitral valve prolapse)   . Dysrhythmia     pt states runs a little fast   . Shortness of breath     with exertion   . Depression   . Anxiety   . Cancer     left breast  . Heart murmur     PAST SURGICAL HISTORY: Past Surgical History  Procedure Date  . Cesarean section 1980, 1982    X 2   . Laparoscopy     work up for infertility  . Tympanoplasty     left X 2  . Left breast fibroadenoma removal 1960's  . Breast cyst aspirations   . Left modified radial mastectomy   . Laparotomy 10/29/2011    Procedure: EXPLORATORY LAPAROTOMY;  Surgeon: Rejeana Brock A. Duard Brady,  MD;  Location: [facility];  Service: Gynecology;  Laterality: N/A;  . Abdominal hysterectomy 10/29/2011    Procedure: HYSTERECTOMY ABDOMINAL;  Surgeon: Rejeana Brock A. Duard Brady, MD;  Location: [facility];  Service: Gynecology;  Laterality: N/A;  Total abdominal hysterectomy  . Salpingoophorectomy 10/29/2011    Procedure: SALPINGO OOPHERECTOMY;  Surgeon: Rejeana Brock A. Duard Brady,  MD;  Location: [facility];  Service: Gynecology;  Laterality: Bilateral;    FAMILY HISTORY Family History  Problem Relation Age of Onset  . Cancer Mother     endometrial/ colon  . Cancer Brother     colon  . COPD Other   . Hypertension Other    the patient's father died at the age of 10. The patient's mother died at the age of 20 she had a history of colon cancer and endometrial cancer. The patient has no sisters. She has one brother, with a history of colon cancer. There is no history of breast cancer in the family. The patient is scheduled for genetic testing later this month.  GYNECOLOGIC HISTORY: She had menarche age 77. First pregnancy to term was at age 56. She is GX T2. She became menopausal at the time of her chemotherapy for breast cancer. This was age 7. 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 Her husband [name] runs a Psychologist, sport and exercise out of their own home. Daughter [name], 97,  currently lives at home with the patient. There are some issues relating to this daughter that the patient discussed briefly. [place]auhter. [name], 30, is a trauma nurse in [place]. The patient has no grandchildren. She is not a Advice worker.   ADVANCED DIRECTIVES: not in place  HEALTH MAINTENANCE: History  Substance Use Topics  . Smoking status: Current Everyday Smoker -- 1.0 packs/day for 30 years    Types: Cigarettes  . Smokeless tobacco: Never Used   Comment: quit again 2009; restarted 1 ppd 2011  . Alcohol Use: No     Colonoscopy: 2009  PAP: s/p hysterectomy  Bone density: 2010. "good"  Mammography: 2012 NOV--unremarkable  No Known Allergies  Current Outpatient Prescriptions  Medication Sig Dispense Refill  . acetaminophen (TYLENOL) 325 MG tablet Take 650 mg by mouth every 6 (six) hours as needed. For pain      . albuterol-ipratropium (COMBIVENT) 18-103 MCG/ACT inhaler Inhale 2 puffs into the lungs every 4 (four)  hours as needed. For wheezing      . ALPRAZolam (XANAX) 0.5 MG tablet Take 1 tablet (0.5 mg total) by mouth 2 (two) times daily.  60 tablet  5  . aspirin EC 81 MG tablet Take 81 mg by mouth daily.      Marland Kitchen atenolol (TENORMIN) 50 MG tablet Take 25 mg by mouth 2 (two) times daily.      Marland Kitchen b complex vitamins tablet Take 1 tablet by mouth daily.        . budesonide-formoterol (SYMBICORT) 160-4.5 MCG/ACT inhaler Inhale 2 puffs into the lungs 2 (two) times daily.  1 Inhaler  5  . buPROPion (WELLBUTRIN SR) 150 MG 12 hr tablet Take 150 mg by mouth daily after breakfast.      . busPIRone (BUSPAR) 15 MG tablet Take 1 tablet (15 mg total) by mouth 2 (two) times daily.  180 tablet  1  . cholecalciferol (VITAMIN D) 1000 UNITS tablet Take 1,000 Units by mouth daily.        Marland Kitchen dexamethasone (DECADRON) 4 MG tablet Take 2 tablets by  mouth two times a day starting the day after chemotherapy for 3 days.  30 tablet  1  . diphenhydrAMINE (SOMINEX) 25 MG tablet Take 25 mg by mouth at bedtime as needed. For sleep      . glucose blood (ONE TOUCH TEST STRIPS) test strip Use as instructed  100 each  3  . ipratropium-albuterol (DUONEB) 0.5-2.5 (3) MG/3ML SOLN Inhale 3 mLs into the lungs 3 (three) times daily.       Marland Kitchen lovastatin (MEVACOR) 40 MG tablet Take 1 tablet (40 mg total) by mouth at bedtime.  90 tablet  2  . metFORMIN (GLUCOPHAGE) 1000 MG tablet Take 1 tablet (1,000 mg total) by mouth 2 (two) times daily with a meal.  180 tablet  3  . omeprazole (PRILOSEC) 40 MG capsule Take 1 capsule (40 mg total) by mouth daily.  90 capsule  3  . ondansetron (ZOFRAN) 8 MG tablet Take 1 tab two times a day starting the day after chemo for 3 days. Then take 1 tab two times a day as needed for nausea or vomiting.  30 tablet  2  . oxymetazoline (AFRIN) 0.05 % nasal spray Place 2 sprays into the nose 2 (two) times daily as needed. Allergies       . prochlorperazine (COMPAZINE) 10 MG tablet Take 1 tablet (10 mg total) by mouth every 6 (six)  hours as needed (Nausea or vomiting).  30 tablet  3  . ranitidine (ZANTAC) 150 MG tablet Take 150 mg by mouth 2 (two) times daily as needed. Heart burn       . repaglinide (PRANDIN) 2 MG tablet Take 2 mg by mouth 3 (three) times daily as needed. blood glucose- lowering      . sertraline (ZOLOFT) 100 MG tablet Take 100 mg by mouth daily before breakfast.      . lidocaine-prilocaine (EMLA) cream Apply topically as needed.  60 g  3  . oxyCODONE-acetaminophen (ROXICET) 5-325 MG per tablet Take 1 tablet by mouth every 4 (four) hours as needed for pain.  40 tablet  0  . DISCONTD: roflumilast (DALIRESP) 500 MCG TABS tablet Take 500 mcg by mouth daily.          OBJECTIVE: Middle-aged white woman who appears comfortable. Her husband is present during the exam. Filed Vitals:   12/02/11 1536  BP: 123/71  Pulse: 54  Temp: 98.1 F (36.7 C)     Body mass index is 20.46 kg/(m^2).    ECOG FS: 0 Physical Exam: HEENT:  Sclerae anicteric, conjunctivae pink.  Oropharynx clear.  No mucositis or candidiasis.   Nodes:  No cervical, supraclavicular, or axillary lymphadenopathy palpated.  Breast Exam:  Deferred  Lungs:  Clear to auscultation bilaterally.  No crackles, rhonchi, or wheezes.   Heart:  Regular rate and rhythm.   Abdomen:  Soft, nontender to gentle palpation.  Positive bowel sounds.  No organomegaly or masses palpated.   Musculoskeletal:  No focal spinal tenderness to palpation. Port is intact in the right upper chest wall with no erythema, edema, or evidence of infection. Incision is well-healed. Extremities:  Benign.  No peripheral edema or cyanosis.   Skin:  Benign.   Neuro:  Nonfocal.    LAB RESULTS: Lab Results  Component Value Date   WBC 10.9* 12/02/2011   NEUTROABS 7.0* 12/02/2011   HGB 11.6 12/02/2011   HCT 35.5 12/02/2011   MCV 86.0 12/02/2011   PLT 434* 12/02/2011      Chemistry  Component Value Date/Time   NA 138 11/15/2011 1040   K 5.4* 11/15/2011 1040   CL 105 11/15/2011  1040   CO2 22 11/15/2011 1040   BUN 18 11/15/2011 1040   CREATININE 0.75 11/15/2011 1040      Component Value Date/Time   CALCIUM 9.6 11/15/2011 1040   ALKPHOS 79 11/15/2011 1040   AST 12 11/15/2011 1040   ALT 11 11/15/2011 1040   BILITOT 0.3 11/15/2011 1040       CA 125 drawn on 11/15/11 was  839.  Today's results are pending.    STUDIES: Chest 2 View  10/24/2011  *RADIOLOGY REPORT*  Clinical Data: Preoperative evaluation.  Recent diagnosis of ovarian cancer.  Prior history of breast cancer with mastectomy in 1993.  30 pack year history of smoking with COPD  CHEST - 2 VIEW  Comparison: 01/17/2010  Findings: The patient is status post left mastectomy and left axillary node dissection.  Hyperinflation is stable with underlying COPD and prominence of the interstitial markings correlating with underlying bronchitic change.  The cardiomediastinal silhouette is stable with heart size upper limits of normal.  The lung fields appear clear with no signs of focal infiltrate or congestive failure.  No pleural fluid or significant peribronchial cuffing is identified.  No focal nodularity is seen to suggest the presence of metastatic disease.  Bony structures appear intact.  IMPRESSION: Stable COPD changes.  No new worrisome focal or acute abnormality identified.  Original Report Authenticated By: Bertha Stakes, M.D.    ASSESSMENT: 71 year old [place] woman  (1) status post left modified radical mastectomy remotely (it we will try to retrieve those records), treated adjuvantly with CMF chemotherapy, with no evidence of recurrence  (2) now status post TAH BSO, omentectomy, and suboptimal debulking 10/29/2011 for a high-grade serous adenocarcinoma of the ovary, pT3c NX (Stage IIIC).due to initiate chemotherapy to consist of 6 q. three-week doses of carboplatin/paclitaxel, with Neulasta on day 2 for granulocyte support.   (3) COPD with ongoing tobacco abuse  (4) diabetes mellitus  PLAN:    Sydni will return tomorrow as scheduled to initiate her chemotherapy. Over half of our 45 minute appointment today was spent reviewing possible side effects and toxicities, and also reviewing her antinausea regimen. This will include dexamethasone, ondansetron, and prochlorperazine following each cycle. She was also given a prescription for EMLA cream. All of these instructions were given to her in writing today.  I'll plan on seeing Ersilia again next week on May 7 for assessment chemotoxicity. She's already scheduled to see Dr. Duard Brady for follow up on May 8, and will see Dr. Darnelle Catalan when she returns for her second dose of chemotherapy on May 21.  Taquita and her husband both voice understanding and agreement with this plan and will call any changes or problems.  Emmah Bratcher    12/02/2011

## 2011-12-02 NOTE — Telephone Encounter (Signed)
gve the pt her may-July 2013 appt calendar 

## 2011-12-03 ENCOUNTER — Ambulatory Visit (HOSPITAL_BASED_OUTPATIENT_CLINIC_OR_DEPARTMENT_OTHER): Payer: Medicare Other

## 2011-12-03 VITALS — BP 103/61 | HR 91 | Temp 98.0°F

## 2011-12-03 DIAGNOSIS — Z5111 Encounter for antineoplastic chemotherapy: Secondary | ICD-10-CM

## 2011-12-03 DIAGNOSIS — C569 Malignant neoplasm of unspecified ovary: Secondary | ICD-10-CM

## 2011-12-03 MED ORDER — DEXAMETHASONE SODIUM PHOSPHATE 4 MG/ML IJ SOLN
20.0000 mg | Freq: Once | INTRAMUSCULAR | Status: AC
  Administered 2011-12-03: 20 mg via INTRAVENOUS

## 2011-12-03 MED ORDER — ONDANSETRON 16 MG/50ML IVPB (CHCC)
16.0000 mg | Freq: Once | INTRAVENOUS | Status: AC
  Administered 2011-12-03: 16 mg via INTRAVENOUS

## 2011-12-03 MED ORDER — SODIUM CHLORIDE 0.9 % IV SOLN
435.0000 mg | Freq: Once | INTRAVENOUS | Status: AC
  Administered 2011-12-03: 440 mg via INTRAVENOUS
  Filled 2011-12-03: qty 44

## 2011-12-03 MED ORDER — HEPARIN SOD (PORK) LOCK FLUSH 100 UNIT/ML IV SOLN
500.0000 [IU] | Freq: Once | INTRAVENOUS | Status: AC | PRN
  Administered 2011-12-03: 500 [IU]
  Filled 2011-12-03: qty 5

## 2011-12-03 MED ORDER — SODIUM CHLORIDE 0.9 % IJ SOLN
10.0000 mL | INTRAMUSCULAR | Status: DC | PRN
  Administered 2011-12-03: 10 mL
  Filled 2011-12-03: qty 10

## 2011-12-03 MED ORDER — SODIUM CHLORIDE 0.9 % IV SOLN
Freq: Once | INTRAVENOUS | Status: AC
  Administered 2011-12-03: 12:00:00 via INTRAVENOUS

## 2011-12-03 MED ORDER — DIPHENHYDRAMINE HCL 50 MG/ML IJ SOLN
25.0000 mg | Freq: Once | INTRAMUSCULAR | Status: AC
  Administered 2011-12-03: 25 mg via INTRAVENOUS

## 2011-12-03 MED ORDER — PACLITAXEL CHEMO INJECTION 300 MG/50ML
175.0000 mg/m2 | Freq: Once | INTRAVENOUS | Status: AC
  Administered 2011-12-03: 276 mg via INTRAVENOUS
  Filled 2011-12-03: qty 46

## 2011-12-03 MED ORDER — FAMOTIDINE IN NACL 20-0.9 MG/50ML-% IV SOLN
20.0000 mg | Freq: Once | INTRAVENOUS | Status: AC
  Administered 2011-12-03: 20 mg via INTRAVENOUS

## 2011-12-03 NOTE — Patient Instructions (Signed)
[facility] Discharge Instructions for Patients Receiving Chemotherapy  Today you received the following chemotherapy agents TAXOL,CARBOPLATIN To help prevent nausea and vomiting after your treatment, we encourage you to take your nausea medication Begin taking it  and take it as often as prescribed.   If you develop nausea and vomiting that is not controlled by your nausea medication, call the clinic. If it is after clinic hours your family physician or the after hours number for the clinic or go to the Emergency Department.   BELOW ARE SYMPTOMS THAT SHOULD BE REPORTED IMMEDIATELY:  *FEVER GREATER THAN 100.5 F  *CHILLS WITH OR WITHOUT FEVER  NAUSEA AND VOMITING THAT IS NOT CONTROLLED WITH YOUR NAUSEA MEDICATION  *UNUSUAL SHORTNESS OF BREATH  *UNUSUAL BRUISING OR BLEEDING  TENDERNESS IN MOUTH AND THROAT WITH OR WITHOUT PRESENCE OF ULCERS  *URINARY PROBLEMS  *BOWEL PROBLEMS  UNUSUAL RASH Items with * indicate a potential emergency and should be followed up as soon as possible.  One of the nurses will contact you 24 hours after your treatment. Please let the nurse know about any problems that you may have experienced. Feel free to call the clinic you have any questions or concerns. The clinic phone number is [phone].   I have been informed and understand all the instructions given to me. I know to contact the clinic, my physician, or go to the Emergency Department if any problems should occur. I do not have any questions at this time, but understand that I may call the clinic during office hours   should I have any questions or need assistance in obtaining follow up care.    __________________________________________  _____________  __________ Signature of Patient or Authorized Representative            Date                   Time    __________________________________________ Nurse's Signature

## 2011-12-04 ENCOUNTER — Telehealth: Payer: Self-pay | Admitting: *Deleted

## 2011-12-04 ENCOUNTER — Ambulatory Visit (HOSPITAL_BASED_OUTPATIENT_CLINIC_OR_DEPARTMENT_OTHER): Payer: Medicare Other

## 2011-12-04 VITALS — BP 103/57 | HR 96 | Temp 99.3°F

## 2011-12-04 DIAGNOSIS — C569 Malignant neoplasm of unspecified ovary: Secondary | ICD-10-CM

## 2011-12-04 MED ORDER — PEGFILGRASTIM INJECTION 6 MG/0.6ML
6.0000 mg | Freq: Once | SUBCUTANEOUS | Status: AC
  Administered 2011-12-04: 6 mg via SUBCUTANEOUS
  Filled 2011-12-04: qty 0.6

## 2011-12-04 NOTE — Telephone Encounter (Signed)
Patient here for Neulasta injection following 1st chemotherapy treatment on 12/03/11.  States she is doing well.  No nausea or vomiting.  Does have some diarrhea, but this is normal for her since her surgery.  She is drinking and eating without difficulty.  Know to call with any questions or problems.

## 2011-12-10 ENCOUNTER — Other Ambulatory Visit (HOSPITAL_BASED_OUTPATIENT_CLINIC_OR_DEPARTMENT_OTHER): Payer: Medicare Other | Admitting: Lab

## 2011-12-10 ENCOUNTER — Encounter: Payer: Self-pay | Admitting: Physician Assistant

## 2011-12-10 ENCOUNTER — Ambulatory Visit (HOSPITAL_BASED_OUTPATIENT_CLINIC_OR_DEPARTMENT_OTHER): Payer: Medicare Other | Admitting: Physician Assistant

## 2011-12-10 ENCOUNTER — Other Ambulatory Visit: Payer: Self-pay | Admitting: *Deleted

## 2011-12-10 VITALS — BP 131/69 | HR 105 | Temp 98.3°F | Ht 64.0 in | Wt 120.0 lb

## 2011-12-10 DIAGNOSIS — F329 Major depressive disorder, single episode, unspecified: Secondary | ICD-10-CM

## 2011-12-10 DIAGNOSIS — C7982 Secondary malignant neoplasm of genital organs: Secondary | ICD-10-CM

## 2011-12-10 DIAGNOSIS — C569 Malignant neoplasm of unspecified ovary: Secondary | ICD-10-CM

## 2011-12-10 DIAGNOSIS — J449 Chronic obstructive pulmonary disease, unspecified: Secondary | ICD-10-CM

## 2011-12-10 DIAGNOSIS — C801 Malignant (primary) neoplasm, unspecified: Secondary | ICD-10-CM

## 2011-12-10 DIAGNOSIS — R202 Paresthesia of skin: Secondary | ICD-10-CM

## 2011-12-10 DIAGNOSIS — K668 Other specified disorders of peritoneum: Secondary | ICD-10-CM

## 2011-12-10 DIAGNOSIS — Z853 Personal history of malignant neoplasm of breast: Secondary | ICD-10-CM

## 2011-12-10 DIAGNOSIS — K219 Gastro-esophageal reflux disease without esophagitis: Secondary | ICD-10-CM

## 2011-12-10 DIAGNOSIS — E119 Type 2 diabetes mellitus without complications: Secondary | ICD-10-CM

## 2011-12-10 LAB — CBC WITH DIFFERENTIAL/PLATELET
BASO%: 0.5 % (ref 0.0–2.0)
LYMPH%: 9.2 % — ABNORMAL LOW (ref 14.0–49.7)
MCHC: 32.8 g/dL (ref 31.5–36.0)
MCV: 85.5 fL (ref 79.5–101.0)
MONO%: 5.8 % (ref 0.0–14.0)
Platelets: 326 10*3/uL (ref 145–400)
RBC: 3.96 10*6/uL (ref 3.70–5.45)
WBC: 45.6 10*3/uL — ABNORMAL HIGH (ref 3.9–10.3)
nRBC: 0 % (ref 0–0)

## 2011-12-10 MED ORDER — MAGIC MOUTHWASH W/LIDOCAINE: 5.0000 mL | Freq: Four times a day (QID) | ORAL | Status: DC | PRN

## 2011-12-10 MED ORDER — LORAZEPAM 0.5 MG PO TABS: ORAL_TABLET | ORAL | Status: DC

## 2011-12-10 NOTE — Progress Notes (Signed)
ID: [place]   DOB: [birth year]  MR#: [id number]  CSN#:[id number]  HISTORY OF PRESENT ILLNESS: [name] [name] is a 71 year-old [place] woman I followed remotely for a history of breast cancer. More recently she noted that her belly was getting "bigger". Gradually she has developed increasing low back pain. After a period of several months, as her symptoms increased, she brought this problem to her primary physician's attention and a KUB was obtained on 10/07/2011, which was unremarkable. This was followed by a CT scan 10/15/2011, which showed ascites and omental caking. Paracentesis 10/18/2011 showed ([id number]) malignant cells consistent with metastatic adenocarcinoma, with equivocal staining for estrogen receptor. The pattern of additional stains was most suggestive of a primary gynecologic tumor. CA 125 at this time was 2051.  The patient was referred to gynecologic oncology and on 10/29/2011 underwent exploratory laparotomy under Drs. Cleda Mccreedy and Antionette Char. This consisted of a total abdominal hysterectomy with bilateral salpingo-oophorectomy, omentectomy, and radical tumor debulking. Unfortunately there was significant tumor attached to the diaphragm so the patient was suboptimally debulked. Accordingly a peritoneal catheter was not placed.  The patient is to be treated with 6 cycles of carboplatin/paclitaxel given every 3 weeks, with Neulasta on day 2 for granulocyte support.  INTERVAL HISTORY:  [name] returns today for assessment of chemotoxicity on day 8 cycle 1 of 6 planned every 3 week doses of carboplatin/paclitaxel given for recent diagnosis of ovarian carcinoma. She receives Neulasta on day 2 for granulocyte support.  [name] tells me it has been made "good week" her oldest daughter, [name] [name], is here with her today, and seems to be doing better. Obviously, this reduces [name]'s stress level.  REVIEW OF SYSTEMS:  [name] tolerated the first cycle quite well with few  complaints. She had some slightly loose bowels following treatment on day 2, but was constipated by day 3 and needed a glycerin suppository. Her bowels have normalized. She denies any rectal bleeding.   [name]'s biggest complaints today are insomnia and the bony pain associated with the Neulasta injection. She's been taking her Xanax along with some Benadryl with minimal relief for the insomnia. She had pain primarily in the hips, but also slightly in the sternum. She took Claritin, Tylenol, and Aleve with good results. She tells me the pain "totally went away" on Friday, May 3.  She denies any fevers, chills, or night sweats. No nausea or emesis. No chest pain or shortness of breath. No peripheral swelling or peripheral neuropathy. She has had some mouth ulcers, but no problems swallowing.  Otherwise a detailed review of systems is noncontributory.  PAST MEDICAL HISTORY: Past Medical History  Diagnosis Date  . History of breast cancer   . COPD (chronic obstructive pulmonary disease)   . GERD (gastroesophageal reflux disease)   . Diabetes mellitus     type II  . Hx of colonic polyps   . Osteoarthritis   . MVP (mitral valve prolapse)   . Dysrhythmia     pt states runs a little fast   . Shortness of breath     with exertion   . Depression   . Anxiety   . Cancer     left breast  . Heart murmur     PAST SURGICAL HISTORY: Past Surgical History  Procedure Date  . Cesarean section 1980, 1982    X 2   . Laparoscopy     work up for infertility  . Tympanoplasty     left X 2  . Left  breast fibroadenoma removal 1960's  . Breast cyst aspirations   . Left modified radial mastectomy   . Laparotomy 10/29/2011    Procedure: EXPLORATORY LAPAROTOMY;  Surgeon: Rejeana Brock A. Duard Brady, MD;  Location: [facility];  Service: Gynecology;  Laterality: N/A;  . Abdominal hysterectomy 10/29/2011    Procedure: HYSTERECTOMY ABDOMINAL;  Surgeon: Rejeana Brock A. Duard Brady, MD;  Location: [facility];  Service: Gynecology;   Laterality: N/A;  Total abdominal hysterectomy  . Salpingoophorectomy 10/29/2011    Procedure: SALPINGO OOPHERECTOMY;  Surgeon: Rejeana Brock A. Duard Brady, MD;  Location: [facility];  Service: Gynecology;  Laterality: Bilateral;    FAMILY HISTORY Family History  Problem Relation Age of Onset  . Cancer Mother     endometrial/ colon  . Cancer Brother     colon  . COPD Other   . Hypertension Other    the patient's father died at the age of 64. The patient's mother died at the age of 43 she had a history of colon cancer and endometrial cancer. The patient has no sisters. She has one brother, with a history of colon cancer. There is no history of breast cancer in the family. The patient is scheduled for genetic testing later this month.  GYNECOLOGIC HISTORY: She had menarche age 23. First pregnancy to term was at age 59. She is GX T2. She became menopausal at the time of her chemotherapy for breast cancer. This was age 73. 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 Her husband [name] runs a Psychologist, sport and exercise out of their own home. Daughter [name] [name], 59,  currently lives at home with the patient. There are some issues relating to this daughter that the patient discussed briefly. [place]. [name] [name], 30, is a trauma nurse in [place]. The patient has no grandchildren. She is not a Advice worker.   ADVANCED DIRECTIVES: not in place  HEALTH MAINTENANCE: History  Substance Use Topics  . Smoking status: Current Everyday Smoker -- 1.0 packs/day for 30 years    Types: Cigarettes  . Smokeless tobacco: Never Used   Comment: quit again 2009; restarted 1 ppd 2011  . Alcohol Use: No     Colonoscopy: 2009  PAP: s/p hysterectomy  Bone density: 2010. "good"  Mammography: 2012 NOV--unremarkable  No Known Allergies  Current Outpatient Prescriptions  Medication Sig Dispense Refill  . acetaminophen (TYLENOL) 325 MG tablet Take 650 mg by mouth  every 6 (six) hours as needed. For pain      . albuterol-ipratropium (COMBIVENT) 18-103 MCG/ACT inhaler Inhale 2 puffs into the lungs every 4 (four) hours as needed. For wheezing      . aspirin EC 81 MG tablet Take 81 mg by mouth daily.      Marland Kitchen atenolol (TENORMIN) 50 MG tablet Take 25 mg by mouth 2 (two) times daily.      Marland Kitchen b complex vitamins tablet Take 1 tablet by mouth daily.        . budesonide-formoterol (SYMBICORT) 160-4.5 MCG/ACT inhaler Inhale 2 puffs into the lungs 2 (two) times daily.  1 Inhaler  5  . buPROPion (WELLBUTRIN SR) 150 MG 12 hr tablet Take 150 mg by mouth daily after breakfast.      . busPIRone (BUSPAR) 15 MG tablet Take 1 tablet (15 mg total) by mouth 2 (two) times daily.  180 tablet  1  . cholecalciferol (VITAMIN D) 1000 UNITS tablet Take 1,000 Units by mouth daily.        Marland Kitchen dexamethasone (  DECADRON) 4 MG tablet Take 2 tablets by mouth two times a day starting the day after chemotherapy for 3 days.  30 tablet  1  . diphenhydrAMINE (SOMINEX) 25 MG tablet Take 25 mg by mouth at bedtime as needed. For sleep      . glucose blood (ONE TOUCH TEST STRIPS) test strip Use as instructed  100 each  3  . ipratropium-albuterol (DUONEB) 0.5-2.5 (3) MG/3ML SOLN Inhale 3 mLs into the lungs 3 (three) times daily.       Marland Kitchen lidocaine-prilocaine (EMLA) cream Apply topically as needed.  60 g  3  . loratadine (CLARITIN) 10 MG tablet Take 10 mg by mouth daily.      Marland Kitchen lovastatin (MEVACOR) 40 MG tablet Take 1 tablet (40 mg total) by mouth at bedtime.  90 tablet  2  . metFORMIN (GLUCOPHAGE) 1000 MG tablet Take 1 tablet (1,000 mg total) by mouth 2 (two) times daily with a meal.  180 tablet  3  . naproxen sodium (ANAPROX) 220 MG tablet Take 220 mg by mouth 2 (two) times daily with a meal.      . omeprazole (PRILOSEC) 40 MG capsule Take 1 capsule (40 mg total) by mouth daily.  90 capsule  3  . ondansetron (ZOFRAN) 8 MG tablet Take 1 tab two times a day starting the day after chemo for 3 days. Then take 1  tab two times a day as needed for nausea or vomiting.  30 tablet  2  . oxymetazoline (AFRIN) 0.05 % nasal spray Place 2 sprays into the nose 2 (two) times daily as needed. Allergies       . prochlorperazine (COMPAZINE) 10 MG tablet Take 1 tablet (10 mg total) by mouth every 6 (six) hours as needed (Nausea or vomiting).  30 tablet  3  . ranitidine (ZANTAC) 150 MG tablet Take 150 mg by mouth 2 (two) times daily as needed. Heart burn       . repaglinide (PRANDIN) 2 MG tablet Take 2 mg by mouth 3 (three) times daily as needed. blood glucose- lowering      . sertraline (ZOLOFT) 100 MG tablet Take 100 mg by mouth daily before breakfast.      . Alum & Mag Hydroxide-Simeth (MAGIC MOUTHWASH W/LIDOCAINE) SOLN Take 5 mLs by mouth 4 (four) times daily as needed.  240 mL  2  . LORazepam (ATIVAN) 0.5 MG tablet 1-2 PO QHS PRN anxiety and/or sleeplessness  60 tablet  0  . oxyCODONE-acetaminophen (ROXICET) 5-325 MG per tablet Take 1 tablet by mouth every 4 (four) hours as needed for pain.  40 tablet  0  . DISCONTD: roflumilast (DALIRESP) 500 MCG TABS tablet Take 500 mcg by mouth daily.          OBJECTIVE: Middle-aged white woman who appears comfortable. Filed Vitals:   12/10/11 1502  BP: 131/69  Pulse: 105  Temp: 98.3 F (36.8 C)     Body mass index is 20.60 kg/(m^2).    ECOG FS: 0 Physical Exam: HEENT:  Sclerae anicteric, conjunctivae pink.  Oropharynx shows evidence of several small ulcerations in the posterior buccal mucosa bilaterally. No oral candidiasis.   Nodes:  No cervical, supraclavicular, or axillary lymphadenopathy palpated.  Breast Exam:  Deferred  Lungs:  Clear to auscultation bilaterally.  No crackles, rhonchi, or wheezes.   Heart:  Regular rate and rhythm.   Abdomen:  Soft, nontender to gentle palpation.  Positive bowel sounds.  No organomegaly or masses palpated.   Musculoskeletal:  No focal spinal tenderness to palpation. Port is intact in the right upper chest wall with no erythema,  edema, or evidence of infection.  Extremities:  Benign.  No peripheral edema or cyanosis.   Skin:  Benign.   Neuro:  Nonfocal.    LAB RESULTS: Lab Results  Component Value Date   WBC 45.6* 12/10/2011   NEUTROABS 38.1* 12/10/2011   HGB 11.1* 12/10/2011   HCT 33.9* 12/10/2011   MCV 85.5 12/10/2011   PLT 326 12/10/2011      Chemistry      Component Value Date/Time   NA 137 12/02/2011 1515   K 4.2 12/02/2011 1515   CL 102 12/02/2011 1515   CO2 24 12/02/2011 1515   BUN 21 12/02/2011 1515   CREATININE 0.76 12/02/2011 1515      Component Value Date/Time   CALCIUM 9.7 12/02/2011 1515   ALKPHOS 81 12/02/2011 1515   AST 17 12/02/2011 1515   ALT 21 12/02/2011 1515   BILITOT 0.2* 12/02/2011 1515     CA125 on 12/02/2011 was 496.1, down from 838.5 on 11/15/2011, and over 2000 on 10/16/2011.  STUDIES: Chest 2 View  10/24/2011  *RADIOLOGY REPORT*  Clinical Data: Preoperative evaluation.  Recent diagnosis of ovarian cancer.  Prior history of breast cancer with mastectomy in 1993.  30 pack year history of smoking with COPD  CHEST - 2 VIEW  Comparison: 01/17/2010  Findings: The patient is status post left mastectomy and left axillary node dissection.  Hyperinflation is stable with underlying COPD and prominence of the interstitial markings correlating with underlying bronchitic change.  The cardiomediastinal silhouette is stable with heart size upper limits of normal.  The lung fields appear clear with no signs of focal infiltrate or congestive failure.  No pleural fluid or significant peribronchial cuffing is identified.  No focal nodularity is seen to suggest the presence of metastatic disease.  Bony structures appear intact.  IMPRESSION: Stable COPD changes.  No new worrisome focal or acute abnormality identified.  Original Report Authenticated By: Bertha Stakes, M.D.    ASSESSMENT: 71 year old [place] woman  (1) status post left modified radical mastectomy remotely (it we will try to retrieve  those records), treated adjuvantly with CMF chemotherapy, with no evidence of recurrence  (2) now status post TAH BSO, omentectomy, and suboptimal debulking 10/29/2011 for a high-grade serous adenocarcinoma of the ovary, pT3c NX (Stage IIIC).due to initiate chemotherapy to consist of 6 q. three-week doses of carboplatin/paclitaxel, with Neulasta on day 2 for granulocyte support.   (3) COPD with ongoing tobacco abuse  (4) diabetes mellitus  PLAN:  Virlee will return tomorrow as scheduled to follow up with Dr. Duard Brady.  She'll meet with our genetic counselor next week, and we'll see Dr. Darnelle Catalan 2 weeks from today on May 21 anticipation of her second dose of chemotherapy.  I have encouraged Davisha to discontinue her Xanax, and replace this with lorazepam at night to help her sleep.  She can certainly continue with Benadryl as before if needed. I also prescribed Magic mouthwash for mild mucositis. She can swish, carbo, and spit up to 4 times daily as needed for mouth pain.  Kailah voices understanding and agreement with our plan, and call with any changes or problems.  Jensen Cheramie    12/10/2011

## 2011-12-11 ENCOUNTER — Ambulatory Visit: Payer: Medicare Other | Attending: Gynecologic Oncology | Admitting: Gynecologic Oncology

## 2011-12-11 ENCOUNTER — Encounter: Payer: Self-pay | Admitting: Gynecologic Oncology

## 2011-12-11 VITALS — BP 118/58 | HR 70 | Temp 98.5°F | Resp 22 | Ht 64.84 in | Wt 120.0 lb

## 2011-12-11 DIAGNOSIS — E119 Type 2 diabetes mellitus without complications: Secondary | ICD-10-CM | POA: Insufficient documentation

## 2011-12-11 DIAGNOSIS — Z79899 Other long term (current) drug therapy: Secondary | ICD-10-CM | POA: Insufficient documentation

## 2011-12-11 DIAGNOSIS — K219 Gastro-esophageal reflux disease without esophagitis: Secondary | ICD-10-CM | POA: Insufficient documentation

## 2011-12-11 DIAGNOSIS — J449 Chronic obstructive pulmonary disease, unspecified: Secondary | ICD-10-CM | POA: Insufficient documentation

## 2011-12-11 DIAGNOSIS — Z853 Personal history of malignant neoplasm of breast: Secondary | ICD-10-CM | POA: Insufficient documentation

## 2011-12-11 DIAGNOSIS — C569 Malignant neoplasm of unspecified ovary: Secondary | ICD-10-CM

## 2011-12-11 DIAGNOSIS — J4489 Other specified chronic obstructive pulmonary disease: Secondary | ICD-10-CM | POA: Insufficient documentation

## 2011-12-11 NOTE — Patient Instructions (Signed)
Follow-up with Dr. Magrinat 

## 2011-12-11 NOTE — Progress Notes (Signed)
Consult Note: Gyn-Onc  [name] [name] 71 y.o. female  CC:  Chief Complaint  Patient presents with  . Ovarian Cancer    Follow up    HPI: HISTORY OF PRESENT ILLNESS:  [name] [name] is a 71 year-old woman a history of breast cancer. More recently she noted that her belly was getting "bigger". Gradually she has developed increasing low back pain. After a period of several months, as her symptoms increased, she brought this problem to her primary physician's attention and a KUB was obtained on 10/07/2011, which was unremarkable. This was followed by a CT scan 10/15/2011, which showed ascites and omental caking. Paracentesis 10/18/2011 showed ([id number]) malignant cells consistent with metastatic adenocarcinoma, with equivocal staining for estrogen receptor. The pattern of additional stains was most suggestive of a primary gynecologic tumor. CA 125 at this time was 2051.   On 10/29/2011, she underwent exploratory laparotomy under Drs. Cleda Mccreedy and Antionette Char. This consisted of a total abdominal hysterectomy with bilateral salpingo-oophorectomy, omentectomy, and radical tumor debulking. Unfortunately there was significant tumor attached to the diaphragm so the patient was suboptimally debulked. Accordingly a peritoneal catheter was not placed.   Specific operative findings included extensive carcinomatosis with significant disease involving the diaphragm on the right side. The majority of nodules are less than 1 cm however there were somewhat confluent. The omentum had a 6 cm primary nodule which was completely removed. At the conclusion of the procedure she had a 2 cm nodule involving the cul-de-sac, diffuse small miliary volume disease involving the mesentery of the small and large bowel, and subcentimeter disease involving the right hemidiaphragm however it was fairly confluent. 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 It was high grade. All biopsies omentum were  positive. The serosa of the uterus was positive. She's had CA 125 strong postoperatively and they have come down rather nicely. Her CA 125 at the time of cycle #1 of paclitaxel and carboplatin on April 29 was 496.    INTERVAL HISTORY: [name] [name] has made an excellent recovery with only mild pain, no constipation, rather occasional loose stools. There's been no dehiscence, erythema, swelling, inflammation, or fever.   REVIEW OF SYSTEMS: Her abdomen is still "full", but less so than previously. Her weight is stable. She is having some difficulty sleeping as we discussed with Ativan 1 mg and Benadryl. She does have some restless legs at night as well. Last week, she was started on Xanax and she states that she took for Xanax and was not able to fall back asleep. She is tolerating her chemotherapy quite well. She still about 10 pounds under her regular weight but is eating well. Her activity level is excellent she's remaining busy and is healing up quite positive. She did have some hip pain after receiving her Neulasta which lasted about 5 days.   Current Meds:  Outpatient Encounter Prescriptions as of 12/11/2011  Medication Sig Dispense Refill  . acetaminophen (TYLENOL) 325 MG tablet Take 650 mg by mouth every 6 (six) hours as needed. For pain      . albuterol-ipratropium (COMBIVENT) 18-103 MCG/ACT inhaler Inhale 2 puffs into the lungs every 4 (four) hours as needed. For wheezing      . ALPRAZolam (XANAX) 1 MG tablet Take 1 mg by mouth at bedtime as needed.      . Alum & Mag Hydroxide-Simeth (MAGIC MOUTHWASH W/LIDOCAINE) SOLN Take 5 mLs by mouth 4 (four) times daily as needed.  240 mL  2  . aspirin EC  81 MG tablet Take 81 mg by mouth daily.      Marland Kitchen atenolol (TENORMIN) 50 MG tablet Take 25 mg by mouth 2 (two) times daily.      Marland Kitchen b complex vitamins tablet Take 1 tablet by mouth daily.        . budesonide-formoterol (SYMBICORT) 160-4.5 MCG/ACT inhaler Inhale 2 puffs into the lungs 2 (two) times daily.  1  Inhaler  5  . buPROPion (WELLBUTRIN SR) 150 MG 12 hr tablet Take 150 mg by mouth daily after breakfast.      . busPIRone (BUSPAR) 15 MG tablet Take 1 tablet (15 mg total) by mouth 2 (two) times daily.  180 tablet  1  . cholecalciferol (VITAMIN D) 1000 UNITS tablet Take 1,000 Units by mouth daily.        Marland Kitchen dexamethasone (DECADRON) 4 MG tablet Take 2 tablets by mouth two times a day starting the day after chemotherapy for 3 days.  30 tablet  1  . diphenhydrAMINE (SOMINEX) 25 MG tablet Take 25 mg by mouth at bedtime as needed. For sleep      . glucose blood (ONE TOUCH TEST STRIPS) test strip Use as instructed  100 each  3  . ipratropium-albuterol (DUONEB) 0.5-2.5 (3) MG/3ML SOLN Inhale 3 mLs into the lungs 3 (three) times daily.       Marland Kitchen lidocaine-prilocaine (EMLA) cream Apply topically as needed.  60 g  3  . loratadine (CLARITIN) 10 MG tablet Take 10 mg by mouth daily.      Marland Kitchen lovastatin (MEVACOR) 40 MG tablet Take 1 tablet (40 mg total) by mouth at bedtime.  90 tablet  2  . metFORMIN (GLUCOPHAGE) 1000 MG tablet Take 1 tablet (1,000 mg total) by mouth 2 (two) times daily with a meal.  180 tablet  3  . naproxen sodium (ANAPROX) 220 MG tablet Take 220 mg by mouth 2 (two) times daily with a meal.      . omeprazole (PRILOSEC) 40 MG capsule Take 1 capsule (40 mg total) by mouth daily.  90 capsule  3  . ondansetron (ZOFRAN) 8 MG tablet Take 1 tab two times a day starting the day after chemo for 3 days. Then take 1 tab two times a day as needed for nausea or vomiting.  30 tablet  2  . oxymetazoline (AFRIN) 0.05 % nasal spray Place 2 sprays into the nose 2 (two) times daily as needed. Allergies       . prochlorperazine (COMPAZINE) 10 MG tablet Take 1 tablet (10 mg total) by mouth every 6 (six) hours as needed (Nausea or vomiting).  30 tablet  3  . ranitidine (ZANTAC) 150 MG tablet Take 150 mg by mouth 2 (two) times daily as needed. Heart burn       . repaglinide (PRANDIN) 2 MG tablet Take 2 mg by mouth 3  (three) times daily as needed. blood glucose- lowering      . sertraline (ZOLOFT) 100 MG tablet Take 100 mg by mouth daily before breakfast.      . LORazepam (ATIVAN) 0.5 MG tablet 1-2 PO QHS PRN anxiety and/or sleeplessness  60 tablet  0  . oxyCODONE-acetaminophen (ROXICET) 5-325 MG per tablet Take 1 tablet by mouth every 4 (four) hours as needed for pain.  40 tablet  0    Allergy: No Known Allergies  Social Hx:   History   Social History  . Marital Status: Married    Spouse Name: N/A    Number  of Children: N/A  . Years of Education: N/A   Occupational History  . Not on file.   Social History Main Topics  . Smoking status: Current Everyday Smoker -- 1.0 packs/day for 30 years    Types: Cigarettes  . Smokeless tobacco: Never Used   Comment: quit again 2009; restarted 1 ppd 2011  . Alcohol Use: No  . Drug Use: No  . Sexually Active: Not Currently   Other Topics Concern  . Not on file   Social History Narrative  . No narrative on file    Past Surgical Hx:  Past Surgical History  Procedure Date  . Cesarean section 1980, 1982    X 2   . Laparoscopy     work up for infertility  . Tympanoplasty     left X 2  . Left breast fibroadenoma removal 1960's  . Breast cyst aspirations   . Left modified radial mastectomy   . Laparotomy 10/29/2011    Procedure: EXPLORATORY LAPAROTOMY;  Surgeon: Rejeana Brock A. Duard Brady, MD;  Location: [facility];  Service: Gynecology;  Laterality: N/A;  . Abdominal hysterectomy 10/29/2011    Procedure: HYSTERECTOMY ABDOMINAL;  Surgeon: Rejeana Brock A. Duard Brady, MD;  Location: [facility];  Service: Gynecology;  Laterality: N/A;  Total abdominal hysterectomy  . Salpingoophorectomy 10/29/2011    Procedure: SALPINGO OOPHERECTOMY;  Surgeon: Rejeana Brock A. Duard Brady, MD;  Location: [facility];  Service: Gynecology;  Laterality: Bilateral;    Past Medical Hx:  Past Medical History  Diagnosis Date  . History of breast cancer   . COPD (chronic obstructive pulmonary disease)   . GERD  (gastroesophageal reflux disease)   . Diabetes mellitus     type II  . Hx of colonic polyps   . Osteoarthritis   . MVP (mitral valve prolapse)   . Dysrhythmia     pt states runs a little fast   . Shortness of breath     with exertion   . Depression   . Anxiety   . Cancer     left breast  . Heart murmur     Family Hx:  Family History  Problem Relation Age of Onset  . Cancer Mother     endometrial/ colon  . Cancer Brother     colon  . COPD Other   . Hypertension Other     Vitals:  Blood pressure 118/58, pulse 70, temperature 98.5 F (36.9 C), resp. rate 22, height 5' 4.84" (1.647 m), weight 120 lb (54.432 kg).  Physical Exam: Well-nourished well-developed female in no acute distress.  Abdomen: Soft,. She has a well-healed vertical midline incision. It is nondistended. There is no fluid wave. There is no appreciable masses.  Extremities: No edema.  Pelvic: Normal external female genitalia. The vagina is atrophic. The vaginal cuff is well-healed. There is no nodularity or masses.  Assessment/Plan:  71 year old with suboptimally debulked stage IIIc ovarian carcinoma who is doing well status post surgery. She has started her chemotherapy and I anticipate she'll have a 6 cycles of chemotherapy. She will followup with Dr. Darnelle Catalan. We'll continue to follow her CA 125's. She is obese see her in the interim is Dr. Darnelle Catalan feels is most appropriate. When she completes therapy it is felt that she has no evidence of recurrent disease is in remission we'll begin alternating visits between Dr. Darnelle Catalan the office in her own. She knows that she can feel free to call us at anytime should she have any questions.  Zanovia Rotz A., MD 12/11/2011, 12:30 PM

## 2011-12-16 ENCOUNTER — Ambulatory Visit (HOSPITAL_BASED_OUTPATIENT_CLINIC_OR_DEPARTMENT_OTHER): Payer: Medicare Other | Admitting: Genetic Counselor

## 2011-12-16 DIAGNOSIS — Z853 Personal history of malignant neoplasm of breast: Secondary | ICD-10-CM

## 2011-12-16 DIAGNOSIS — Z800 Family history of malignant neoplasm of digestive organs: Secondary | ICD-10-CM

## 2011-12-16 DIAGNOSIS — C569 Malignant neoplasm of unspecified ovary: Secondary | ICD-10-CM

## 2011-12-16 DIAGNOSIS — Z86010 Personal history of colonic polyps: Secondary | ICD-10-CM

## 2011-12-16 DIAGNOSIS — IMO0002 Reserved for concepts with insufficient information to code with codable children: Secondary | ICD-10-CM

## 2011-12-16 NOTE — Progress Notes (Signed)
Dr.  Duard Brady requested a consultation for genetic counseling and risk assessment for [name] [name], a 71 y.o. female, for discussion of her personal history of ovarian cancer and breast cancer. She presents to clinic today to discuss the possibility of a genetic predisposition to cancer, and to further clarify her risks, as well as her family members' risks for cancer.   HISTORY OF PRESENT ILLNESS: In 1993, at the age of 87, [name] [name] was diagnosed with breast cancer.  Recently, in January of 2013 she was diagnosed with ovarian cancer.  This was treated with surgery on October 29, 2011.    Past Medical History  Diagnosis Date  . History of breast cancer   . COPD (chronic obstructive pulmonary disease)   . GERD (gastroesophageal reflux disease)   . Diabetes mellitus     type II  . Hx of colonic polyps   . Osteoarthritis   . MVP (mitral valve prolapse)   . Dysrhythmia     pt states runs a little fast   . Shortness of breath     with exertion   . Depression   . Anxiety   . Cancer     left breast  . Heart murmur     Past Surgical History  Procedure Date  . Cesarean section 1980, 1982    X 2   . Laparoscopy     work up for infertility  . Tympanoplasty     left X 2  . Left breast fibroadenoma removal 1960's  . Breast cyst aspirations   . Left modified radial mastectomy   . Laparotomy 10/29/2011    Procedure: EXPLORATORY LAPAROTOMY;  Surgeon: Rejeana Brock A. Duard Brady, MD;  Location: [facility];  Service: Gynecology;  Laterality: N/A;  . Abdominal hysterectomy 10/29/2011    Procedure: HYSTERECTOMY ABDOMINAL;  Surgeon: Rejeana Brock A. Duard Brady, MD;  Location: [facility];  Service: Gynecology;  Laterality: N/A;  Total abdominal hysterectomy  . Salpingoophorectomy 10/29/2011    Procedure: SALPINGO OOPHERECTOMY;  Surgeon: Rejeana Brock A. Duard Brady, MD;  Location: [facility];  Service: Gynecology;  Laterality: Bilateral;    History  Substance Use Topics  . Smoking status: Current Everyday Smoker -- 1.0 packs/day  for 30 years    Types: Cigarettes  . Smokeless tobacco: Never Used   Comment: quit again 2009; restarted 1 ppd 2011  . Alcohol Use: No    REPRODUCTIVE HISTORY AND PERSONAL RISK ASSESSMENT FACTORS: Menarche was at age 19.   Menopause at 50 Uterus Intact: No Ovaries Intact: No G2P2A0 , first live birth at age 56  She has not previously undergone treatment for infertility.   OCP use for 5-7 years   She has not used HRT in the past.    FAMILY HISTORY:  We obtained a detailed, 4-generation family history.  Significant diagnoses are listed below: Family History  Problem Relation Age of Onset  . Cancer Mother     endometrial/ colon  . Cancer Brother     colon  . COPD Other   . Hypertension Other   The patient has been diagnosed with breast cancer at age 29 and ovarian cancer at age 38.  Her only sibling, a brother, was diagnosed with colon cancer at age 66.  The patient's mother was diagnosed with both colon cancer and endometrial cancer at age 27, and a maternal uncle was diagnosed with lung cancer and bladder cancer at unknown ages.  Lastly, her maternal grandmother was diagnosed with salivary gland cancer and died at age  89.  The patient's paternal aunt was diagnosed with leukemia and another aunt was diagnosed with an unknown cancer.  There is no other reported cancer on this side of the family.  Patient's maternal ancestors are of [place] descent, and paternal ancestors are of [place] descent. There is no reported Ashkenazi Jewish ancestry. There is no  known consanguinity.  GENETIC COUNSELING RISK ASSESSMENT, DISCUSSION, AND SUGGESTED FOLLOW UP: We reviewed the natural history and genetic etiology of sporadic, familial and hereditary cancer syndromes.  About 10% of colon cancer is hereditary, with the most common form of hereditary colon cancer being Lynch syndrome.  We reviewed the red flags of Lynch syndrome, common cancers involved in this syndrome and dominant inheritance pattern.   If she tests negative for Lynch syndrome, we discussed that her personal history of both premenopausal breast cancer and ovarian cancer puts her at risk for carrying a BRCA1 or BRCA2 mutation.  About 5-10% of breast cancer is hereditary, with around 85% the result of a BRCA1 or BRCA2 mutation.  We reivewed the red flags of BRCA1 and BRCA2 mutations, the associated cancers and dominant inheritance pattern.  The patient's personal history of breast and ovarian cancer is suggestive of the following possible diagnosis: hereditary cancer syndrome.  We discussed that identification of a hereditary cancer syndrome may help her care providers tailor the patients medical management. If a mutation indicating a hereditary cancer syndrome is detected in this case, the [employer] recommendations would include increased cancer surveillance and possible prophylactic surgery. If a mutation is detected, the patient will be referred back to the referring provider and to any additional appropriate care providers to discuss the relevant options.   If a mutation is not found in the patient, this will decrease the likelihood of hereditary cancer syndrome as the explanation for her breast and ovarian cancer. Cancer surveillance options would be discussed for the patient according to the appropriate standard National Comprehensive Cancer Network and American Cancer Society guidelines, with consideration of their personal and family history risk factors. In this case, the patient will be referred back to their care providers for discussions of management.   In order to estimate her chance of having a MMR mutation seen in Lynch syndrome, we used statistical models (PREMM1,2,6) and laboratory data that take into account her personal medical history, family history and ancestry.  Because each model is different, there can be a lot of variability in the risks they give.  Therefore, these numbers must be  considered a rough range and not a precise risk of having a MMR mutation.  These models estimate that she has approximately a 21.5% chance of having a mutation. Based on this assessment of her family and personal history, genetic testing is recommended.  In order to estimate her chance of having a BRCA1 or BRCA2 mutation, we used statistical models (Penn II) and laboratory data that take into account her personal medical history, family history and ancestry.  Because each model is different, there can be a lot of variability in the risks they give.  Therefore, these numbers must be considered a rough range and not a precise risk of having a BRCA1 or BRCA2 mutation.  These models estimate that she has approximately a 21% chance of having a mutation. Specifically, there is a 9% chance of testing positive for a BRCA1 mutation and a 12% chance of testing positive for a BRCA2 mutation. Based on this assessment of her family and personal history, genetic testing  is recommended.  After considering the risks, benefits, and limitations, the patient provided informed consent for  the following  Testing: [facility] and [facility] through [facility].   Per the patient's request, we will contact her by telephone to discuss these results. A follow up genetic counseling visit will be scheduled if indicated.  The patient was seen for a total of 60 minutes, greater than 50% of which was spent face-to-face counseling.  This plan is being carried out per Dr. Denman George recommendations.  This note will also be sent to the referring provider via the electronic medical record. The patient will be supplied with a summary of this genetic counseling discussion as well as educational information on the discussed hereditary cancer syndromes following the conclusion of their visit.   Patient was discussed with Dr. Drue Second.  EDUCATIONAL INFORMATION SUPPLIED TO PATIENT AT ENCOUNTER:  Hereditary breast and ovarian cancer  syndrome and Lynch syndrome brochures  _______________________________________________________________________ For Office Staff:  Number of people involved in session: 3 Was an Intern/ student involved with case: yes

## 2011-12-18 ENCOUNTER — Telehealth: Payer: Self-pay | Admitting: *Deleted

## 2011-12-18 ENCOUNTER — Other Ambulatory Visit: Payer: Self-pay | Admitting: *Deleted

## 2011-12-18 DIAGNOSIS — C569 Malignant neoplasm of unspecified ovary: Secondary | ICD-10-CM

## 2011-12-18 MED ORDER — DIAZEPAM 5 MG PO TABS: 5.0000 mg | ORAL_TABLET | Freq: Four times a day (QID) | ORAL | Status: DC | PRN

## 2011-12-18 MED ORDER — TRAZODONE HCL 50 MG PO TABS: 50.0000 mg | ORAL_TABLET | Freq: Every day | ORAL | Status: DC

## 2011-12-18 NOTE — Telephone Encounter (Signed)
Pt called to this RN to state recent prescription for xanax for sleep is non benificial. She had some valium in the home ( uses for vertigo ) and has been using it with good outcome. She is out of tablets and was wondering if she could obtain a refill.  This RN discussed with pt above medication not the best choice for long term use for sleep aid and will review note with MD or PA for other medications.

## 2011-12-19 ENCOUNTER — Other Ambulatory Visit: Payer: Self-pay | Admitting: *Deleted

## 2011-12-19 MED ORDER — SERTRALINE HCL 100 MG PO TABS: 100.0000 mg | ORAL_TABLET | Freq: Every day | ORAL | Status: DC

## 2011-12-24 ENCOUNTER — Other Ambulatory Visit (HOSPITAL_BASED_OUTPATIENT_CLINIC_OR_DEPARTMENT_OTHER): Payer: Medicare Other | Admitting: Lab

## 2011-12-24 ENCOUNTER — Ambulatory Visit (HOSPITAL_BASED_OUTPATIENT_CLINIC_OR_DEPARTMENT_OTHER): Payer: Medicare Other

## 2011-12-24 ENCOUNTER — Ambulatory Visit (HOSPITAL_BASED_OUTPATIENT_CLINIC_OR_DEPARTMENT_OTHER): Payer: Medicare Other | Admitting: Oncology

## 2011-12-24 VITALS — BP 129/74 | HR 94 | Temp 98.3°F | Ht 64.0 in | Wt 122.4 lb

## 2011-12-24 DIAGNOSIS — C569 Malignant neoplasm of unspecified ovary: Secondary | ICD-10-CM

## 2011-12-24 DIAGNOSIS — J449 Chronic obstructive pulmonary disease, unspecified: Secondary | ICD-10-CM

## 2011-12-24 DIAGNOSIS — Z5111 Encounter for antineoplastic chemotherapy: Secondary | ICD-10-CM

## 2011-12-24 DIAGNOSIS — Z853 Personal history of malignant neoplasm of breast: Secondary | ICD-10-CM

## 2011-12-24 DIAGNOSIS — F17200 Nicotine dependence, unspecified, uncomplicated: Secondary | ICD-10-CM

## 2011-12-24 LAB — CBC WITH DIFFERENTIAL/PLATELET
Eosinophils Absolute: 0.3 10*3/uL (ref 0.0–0.5)
HCT: 35.1 % (ref 34.8–46.6)
LYMPH%: 18.3 % (ref 14.0–49.7)
MONO#: 1.1 10*3/uL — ABNORMAL HIGH (ref 0.1–0.9)
NEUT#: 8.7 10*3/uL — ABNORMAL HIGH (ref 1.5–6.5)
Platelets: 308 10*3/uL (ref 145–400)
RBC: 4.04 10*6/uL (ref 3.70–5.45)
WBC: 12.4 10*3/uL — ABNORMAL HIGH (ref 3.9–10.3)
lymph#: 2.3 10*3/uL (ref 0.9–3.3)

## 2011-12-24 LAB — COMPREHENSIVE METABOLIC PANEL
ALT: 18 U/L (ref 0–35)
Albumin: 3.9 g/dL (ref 3.5–5.2)
CO2: 27 mEq/L (ref 19–32)
Calcium: 9.2 mg/dL (ref 8.4–10.5)
Chloride: 101 mEq/L (ref 96–112)
Glucose, Bld: 117 mg/dL — ABNORMAL HIGH (ref 70–99)
Sodium: 138 mEq/L (ref 135–145)
Total Bilirubin: 0.2 mg/dL — ABNORMAL LOW (ref 0.3–1.2)
Total Protein: 7.4 g/dL (ref 6.0–8.3)

## 2011-12-24 MED ORDER — HEPARIN SOD (PORK) LOCK FLUSH 100 UNIT/ML IV SOLN
500.0000 [IU] | Freq: Once | INTRAVENOUS | Status: AC | PRN
  Administered 2011-12-24: 500 [IU]
  Filled 2011-12-24: qty 5

## 2011-12-24 MED ORDER — ONDANSETRON 16 MG/50ML IVPB (CHCC)
16.0000 mg | Freq: Once | INTRAVENOUS | Status: AC
  Administered 2011-12-24: 16 mg via INTRAVENOUS

## 2011-12-24 MED ORDER — SODIUM CHLORIDE 0.9 % IV SOLN
Freq: Once | INTRAVENOUS | Status: AC
  Administered 2011-12-24: 09:00:00 via INTRAVENOUS

## 2011-12-24 MED ORDER — DEXAMETHASONE SODIUM PHOSPHATE 4 MG/ML IJ SOLN
20.0000 mg | Freq: Once | INTRAMUSCULAR | Status: AC
  Administered 2011-12-24: 20 mg via INTRAVENOUS

## 2011-12-24 MED ORDER — SODIUM CHLORIDE 0.9 % IV SOLN
435.0000 mg | Freq: Once | INTRAVENOUS | Status: AC
  Administered 2011-12-24: 440 mg via INTRAVENOUS
  Filled 2011-12-24: qty 44

## 2011-12-24 MED ORDER — PACLITAXEL CHEMO INJECTION 300 MG/50ML
175.0000 mg/m2 | Freq: Once | INTRAVENOUS | Status: AC
  Administered 2011-12-24: 276 mg via INTRAVENOUS
  Filled 2011-12-24: qty 46

## 2011-12-24 MED ORDER — FAMOTIDINE IN NACL 20-0.9 MG/50ML-% IV SOLN
20.0000 mg | Freq: Once | INTRAVENOUS | Status: AC
  Administered 2011-12-24: 20 mg via INTRAVENOUS

## 2011-12-24 MED ORDER — SODIUM CHLORIDE 0.9 % IJ SOLN
10.0000 mL | INTRAMUSCULAR | Status: DC | PRN
  Administered 2011-12-24: 10 mL
  Filled 2011-12-24: qty 10

## 2011-12-24 MED ORDER — DIPHENHYDRAMINE HCL 50 MG/ML IJ SOLN
25.0000 mg | Freq: Once | INTRAMUSCULAR | Status: AC
  Administered 2011-12-24: 10:00:00 via INTRAVENOUS

## 2011-12-24 NOTE — Progress Notes (Signed)
ID: [name]   DOB: 1941/04/16  MR#: [id number]  CSN#:[id number]  HISTORY OF PRESENT ILLNESS: [name] [name] is a 71 year-old [place] woman I followed remotely for a history of breast cancer. More recently she noted that her belly was getting "bigger". Gradually she has developed increasing low back pain. After a period of several months, as her symptoms increased, she brought this problem to her primary physician's attention and a KUB was obtained on 10/07/2011, which was unremarkable. This was followed by a CT scan 10/15/2011, which showed ascites and omental caking. Paracentesis 10/18/2011 showed ([id number]) malignant cells consistent with metastatic adenocarcinoma, with equivocal staining for estrogen receptor. The pattern of additional stains was most suggestive of a primary gynecologic tumor. CA 125 at this time was 2051.  The patient was referred to gynecologic oncology and on 10/29/2011 underwent exploratory laparotomy under Drs. Cleda Mccreedy and Antionette Char. This consisted of a total abdominal hysterectomy with bilateral salpingo-oophorectomy, omentectomy, and radical tumor debulking. Unfortunately there was significant tumor attached to the diaphragm so the patient was suboptimally debulked. Accordingly a peritoneal catheter was not placed. GYN-ONC recommended 6 cycles of carboplatin/paclitaxel given every 3 weeks, with Neulasta on day 2 for granulocyte support. Natoya's subsequent history is as detailed below.  INTERVAL HISTORY:  [name] [name] returns today with her daughter [name] [name] (who is an emergency department nurse at [employer]) for day 1 cycle 2 of her carboplatin/paclitaxel treatments.  REVIEW OF SYSTEMS:  She did well with her first cycle, particularly days 1 and 2. On day 3, after receiving Neulasta the day before, she has significant bony pain in her back and hips. She took Aleve 2 tablets twice daily for about 5 days. That took care of the pain. She also took Claritin of  course. She does not think she needs oxycodone or other a mild narcotics. She is taking omeprazole together with the Aleve. She was very "revved up" from the dexamethasone, and her sugars went up as high as 285. Accordingly I am dropping her dexamethasone dose as noted below. She had also been no nausea or vomiting problems. Aside from side effects from the chemotherapy, she keeps a cough productive of clear phlegm in the morning, consistent with chronic bronchitis. She continues to smoke a half a pack per day and we again discussed strategies for her to discontinue smoking. Her stools were slightly loose. Otherwise a detailed review of systems today was noncontributory.   PAST MEDICAL HISTORY: Past Medical History  Diagnosis Date  . History of breast cancer   . COPD (chronic obstructive pulmonary disease)   . GERD (gastroesophageal reflux disease)   . Diabetes mellitus     type II  . Hx of colonic polyps   . Osteoarthritis   . MVP (mitral valve prolapse)   . Dysrhythmia     pt states runs a little fast   . Shortness of breath     with exertion   . Depression   . Anxiety   . Cancer     left breast  . Heart murmur     PAST SURGICAL HISTORY: Past Surgical History  Procedure Date  . Cesarean section 1980, 1982    X 2   . Laparoscopy     work up for infertility  . Tympanoplasty     left X 2  . Left breast fibroadenoma removal 1960's  . Breast cyst aspirations   . Left modified radial mastectomy   . Laparotomy 10/29/2011    Procedure: EXPLORATORY LAPAROTOMY;  Surgeon: Bernita Buffy. Duard Brady, MD;  Location: [facility];  Service: Gynecology;  Laterality: N/A;  . Abdominal hysterectomy 10/29/2011    Procedure: HYSTERECTOMY ABDOMINAL;  Surgeon: Rejeana Brock A. Duard Brady, MD;  Location: [facility];  Service: Gynecology;  Laterality: N/A;  Total abdominal hysterectomy  . Salpingoophorectomy 10/29/2011    Procedure: SALPINGO OOPHERECTOMY;  Surgeon: Rejeana Brock A. Duard Brady, MD;  Location: [facility];  Service: Gynecology;   Laterality: Bilateral;    FAMILY HISTORY Family History  Problem Relation Age of Onset  . Cancer Mother     endometrial/ colon  . Cancer Brother     colon  . COPD Other   . Hypertension Other    the patient's father died at the age of 67. The patient's mother died at the age of 52 she had a history of colon cancer and endometrial cancer. The patient has no sisters. She has one brother, with a history of colon cancer. There is no history of breast cancer in the family. The patient is scheduled for genetic testing later this month.  GYNECOLOGIC HISTORY: She had menarche age 18. First pregnancy to term was at age 50. She is GX T2. She became menopausal at the time of her chemotherapy for breast cancer. This was age 50. 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 Her husband [name] runs a Psychologist, sport and exercise out of their own home. Daughter [name] [name], 95,  currently lives at home with the patient. There are some issues relating to this daughter that the patient discussed briefly. [place]. [name] [name], 30, is an ED nurse at [employer]. The patient has no grandchildren. She is not a Advice worker.   ADVANCED DIRECTIVES: not in place  HEALTH MAINTENANCE: History  Substance Use Topics  . Smoking status: Current Everyday Smoker -- 1.0 packs/day for 30 years    Types: Cigarettes  . Smokeless tobacco: Never Used   Comment: quit again 2009; restarted 1 ppd 2011  . Alcohol Use: No     Colonoscopy: 2009  PAP: s/p hysterectomy  Bone density: 2010. "good"  Mammography: 2012 NOV--unremarkable  No Known Allergies  Current Outpatient Prescriptions  Medication Sig Dispense Refill  . acetaminophen (TYLENOL) 325 MG tablet Take 650 mg by mouth every 6 (six) hours as needed. For pain      . albuterol-ipratropium (COMBIVENT) 18-103 MCG/ACT inhaler Inhale 2 puffs into the lungs every 4 (four) hours as needed. For wheezing      . Alum & Mag  Hydroxide-Simeth (MAGIC MOUTHWASH W/LIDOCAINE) SOLN Take 5 mLs by mouth 4 (four) times daily as needed.  240 mL  2  . aspirin EC 81 MG tablet Take 81 mg by mouth daily.      Marland Kitchen atenolol (TENORMIN) 50 MG tablet Take 25 mg by mouth 2 (two) times daily.      Marland Kitchen b complex vitamins tablet Take 1 tablet by mouth daily.        . budesonide-formoterol (SYMBICORT) 160-4.5 MCG/ACT inhaler Inhale 2 puffs into the lungs 2 (two) times daily.  1 Inhaler  5  . buPROPion (WELLBUTRIN SR) 150 MG 12 hr tablet Take 150 mg by mouth daily after breakfast.      . busPIRone (BUSPAR) 15 MG tablet Take 1 tablet (15 mg total) by mouth 2 (two) times daily.  180 tablet  1  . cholecalciferol (VITAMIN D) 1000 UNITS tablet Take 1,000 Units by mouth daily.        Marland Kitchen dexamethasone (DECADRON) 4  MG tablet Take 2 tablets by mouth two times a day starting the day after chemotherapy for 3 days.  30 tablet  1  . diazepam (VALIUM) 5 MG tablet Take 1 tablet (5 mg total) by mouth every 6 (six) hours as needed for anxiety (pt may take up to 2 tabs if needed).  30 tablet  1  . diphenhydrAMINE (SOMINEX) 25 MG tablet Take 25 mg by mouth at bedtime as needed. For sleep      . glucose blood (ONE TOUCH TEST STRIPS) test strip Use as instructed  100 each  3  . ipratropium-albuterol (DUONEB) 0.5-2.5 (3) MG/3ML SOLN Inhale 3 mLs into the lungs 3 (three) times daily.       Marland Kitchen lidocaine-prilocaine (EMLA) cream Apply topically as needed.  60 g  3  . loratadine (CLARITIN) 10 MG tablet Take 10 mg by mouth daily.      Marland Kitchen LORazepam (ATIVAN) 0.5 MG tablet 1-2 PO QHS PRN anxiety and/or sleeplessness  60 tablet  0  . lovastatin (MEVACOR) 40 MG tablet Take 1 tablet (40 mg total) by mouth at bedtime.  90 tablet  2  . metFORMIN (GLUCOPHAGE) 1000 MG tablet Take 1 tablet (1,000 mg total) by mouth 2 (two) times daily with a meal.  180 tablet  3  . naproxen sodium (ANAPROX) 220 MG tablet Take 220 mg by mouth 2 (two) times daily with a meal.      . omeprazole (PRILOSEC)  40 MG capsule Take 1 capsule (40 mg total) by mouth daily.  90 capsule  3  . ondansetron (ZOFRAN) 8 MG tablet Take 1 tab two times a day starting the day after chemo for 3 days. Then take 1 tab two times a day as needed for nausea or vomiting.  30 tablet  2  . oxyCODONE-acetaminophen (ROXICET) 5-325 MG per tablet Take 1 tablet by mouth every 4 (four) hours as needed for pain.  40 tablet  0  . oxymetazoline (AFRIN) 0.05 % nasal spray Place 2 sprays into the nose 2 (two) times daily as needed. Allergies       . prochlorperazine (COMPAZINE) 10 MG tablet Take 1 tablet (10 mg total) by mouth every 6 (six) hours as needed (Nausea or vomiting).  30 tablet  3  . ranitidine (ZANTAC) 150 MG tablet Take 150 mg by mouth 2 (two) times daily as needed. Heart burn       . repaglinide (PRANDIN) 2 MG tablet Take 2 mg by mouth 3 (three) times daily as needed. blood glucose- lowering      . sertraline (ZOLOFT) 100 MG tablet Take 1 tablet (100 mg total) by mouth daily before breakfast.  90 tablet  2  . traZODone (DESYREL) 50 MG tablet Take 1 tablet (50 mg total) by mouth at bedtime.  30 tablet  4  . DISCONTD: roflumilast (DALIRESP) 500 MCG TABS tablet Take 500 mcg by mouth daily.          OBJECTIVE: Middle-aged white woman who appears well Filed Vitals:   12/24/11 0822  BP: 129/74  Pulse: 94  Temp: 98.3 F (36.8 C)     Body mass index is 21.01 kg/(m^2).    ECOG FS: 0 Physical Exam: HEENT:  Sclerae anicteric, conjunctivae pink.  Oropharynx clear   Nodes:  She has a 2 cmm Right infra-auricular node which she tells me has been "there for years" Breast Exam:  Deferred  Lungs:  Clear to auscultation bilaterally.  No crackles, rhonchi, or wheezes.  Heart:  Regular rate and rhythm.   Abdomen:  Soft, nontender to gentle palpation.  Positive bowel sounds.  No organomegaly or masses palpated.   Musculoskeletal:  No focal spinal tenderness to palpation. Port is intact in the right upper chest wall with no erythema,  edema, or evidence of infection.  Extremities:  Benign.  No peripheral edema or cyanosis.   Neuro:  Nonfocal.    LAB RESULTS: Lab Results  Component Value Date   WBC 12.4* 12/24/2011   NEUTROABS 8.7* 12/24/2011   HGB 11.4* 12/24/2011   HCT 35.1 12/24/2011   MCV 86.9 12/24/2011   PLT 308 12/24/2011      Chemistry      Component Value Date/Time   NA 137 12/02/2011 1515   K 4.2 12/02/2011 1515   CL 102 12/02/2011 1515   CO2 24 12/02/2011 1515   BUN 21 12/02/2011 1515   CREATININE 0.76 12/02/2011 1515      Component Value Date/Time   CALCIUM 9.7 12/02/2011 1515   ALKPHOS 81 12/02/2011 1515   AST 17 12/02/2011 1515   ALT 21 12/02/2011 1515   BILITOT 0.2* 12/02/2011 1515     CA125 on 12/02/2011 was 496.1, down from 838.5 on 11/15/2011, and over 2000 on 10/16/2011.  STUDIES: Chest 2 View  10/24/2011  *RADIOLOGY REPORT*  Clinical Data: Preoperative evaluation.  Recent diagnosis of ovarian cancer.  Prior history of breast cancer with mastectomy in 1993.  30 pack year history of smoking with COPD  CHEST - 2 VIEW  Comparison: 01/17/2010  Findings: The patient is status post left mastectomy and left axillary node dissection.  Hyperinflation is stable with underlying COPD and prominence of the interstitial markings correlating with underlying bronchitic change.  The cardiomediastinal silhouette is stable with heart size upper limits of normal.  The lung fields appear clear with no signs of focal infiltrate or congestive failure.  No pleural fluid or significant peribronchial cuffing is identified.  No focal nodularity is seen to suggest the presence of metastatic disease.  Bony structures appear intact.  IMPRESSION: Stable COPD changes.  No new worrisome focal or acute abnormality identified.  Original Report Authenticated By: Bertha Stakes, M.D.    ASSESSMENT: 71 year old [place] woman  (1) status post left modified radical mastectomy remotely (we will try to retrieve those records),  treated adjuvantly with CMF chemotherapy, with no evidence of recurrence  (2) now status post TAH BSO, omentectomy, and suboptimal debulking 10/29/2011 for a high-grade serous adenocarcinoma of the ovary, pT3c NX (Stage IIIC) receiving chemotherapy with q. three-week doses of carboplatin/paclitaxel, with Neulasta on day 2 for granulocyte support.   (3) COPD with ongoing tobacco abuse  (4) diabetes mellitus  PLAN:  I am dropping her dexamethasone to 2 tablets in the morning only, which I think will help both your sugar and her sleep. Otherwise a making no changes in her chemotherapy. She will return tomorrow for Neulasta and then again on may 28th for followup. The plan is to get through of 6 cycles of chemotherapy with a minimum of toxicity. Her next CA 125 has been scheduled for June 11.  Braleigh Massoud C    12/24/2011

## 2011-12-24 NOTE — Patient Instructions (Signed)
[facility] Discharge Instructions for Patients Receiving Chemotherapy  Today you received the following chemotherapy agents; Taxol and Carbolplatin.  To help prevent nausea and vomiting after your treatment, we encourage you to take your nausea medication. Begin taking it as often as prescribed for by Dr. Darnelle Catalan .   If you develop nausea and vomiting that is not controlled by your nausea medication, call the clinic [phone]. If it is after clinic hours your family physician or the after hours number for the clinic or go to the Emergency Department.   BELOW ARE SYMPTOMS THAT SHOULD BE REPORTED IMMEDIATELY:  *FEVER GREATER THAN 100.5 F  *CHILLS WITH OR WITHOUT FEVER  NAUSEA AND VOMITING THAT IS NOT CONTROLLED WITH YOUR NAUSEA MEDICATION  *UNUSUAL SHORTNESS OF BREATH  *UNUSUAL BRUISING OR BLEEDING  TENDERNESS IN MOUTH AND THROAT WITH OR WITHOUT PRESENCE OF ULCERS  *URINARY PROBLEMS  *BOWEL PROBLEMS  UNUSUAL RASH Items with * indicate a potential emergency and should be followed up as soon as possible.  One of the nurses will contact you 24 hours after your treatment. Please let the nurse know about any problems that you may have experienced. Feel free to call the clinic you have any questions or concerns. The clinic phone number is [phone].   I have been informed and understand all the instructions given to me. I know to contact the clinic, my physician, or go to the Emergency Department if any problems should occur. I do not have any questions at this time, but understand that I may call the clinic during office hours   should I have any questions or need assistance in obtaining follow up care.    __________________________________________  _____________  __________ Signature of Patient or Authorized Representative            Date                   Time    __________________________________________ Nurse's Signature

## 2011-12-25 ENCOUNTER — Ambulatory Visit (HOSPITAL_BASED_OUTPATIENT_CLINIC_OR_DEPARTMENT_OTHER): Payer: Medicare Other

## 2011-12-25 VITALS — BP 116/68 | HR 91 | Temp 97.6°F

## 2011-12-25 DIAGNOSIS — C801 Malignant (primary) neoplasm, unspecified: Secondary | ICD-10-CM

## 2011-12-25 DIAGNOSIS — C7982 Secondary malignant neoplasm of genital organs: Secondary | ICD-10-CM

## 2011-12-25 DIAGNOSIS — C569 Malignant neoplasm of unspecified ovary: Secondary | ICD-10-CM

## 2011-12-25 MED ORDER — PEGFILGRASTIM INJECTION 6 MG/0.6ML
6.0000 mg | Freq: Once | SUBCUTANEOUS | Status: AC
  Administered 2011-12-25: 6 mg via SUBCUTANEOUS
  Filled 2011-12-25: qty 0.6

## 2011-12-26 NOTE — Telephone Encounter (Signed)
NA

## 2011-12-31 ENCOUNTER — Telehealth: Payer: Self-pay | Admitting: Oncology

## 2011-12-31 ENCOUNTER — Other Ambulatory Visit (HOSPITAL_BASED_OUTPATIENT_CLINIC_OR_DEPARTMENT_OTHER): Payer: Medicare Other | Admitting: Lab

## 2011-12-31 ENCOUNTER — Ambulatory Visit (HOSPITAL_BASED_OUTPATIENT_CLINIC_OR_DEPARTMENT_OTHER): Payer: Medicare Other | Admitting: Physician Assistant

## 2011-12-31 ENCOUNTER — Encounter: Payer: Self-pay | Admitting: Physician Assistant

## 2011-12-31 VITALS — BP 118/71 | HR 99 | Temp 98.3°F | Ht 64.0 in | Wt 122.4 lb

## 2011-12-31 DIAGNOSIS — E119 Type 2 diabetes mellitus without complications: Secondary | ICD-10-CM

## 2011-12-31 DIAGNOSIS — J449 Chronic obstructive pulmonary disease, unspecified: Secondary | ICD-10-CM

## 2011-12-31 DIAGNOSIS — C569 Malignant neoplasm of unspecified ovary: Secondary | ICD-10-CM

## 2011-12-31 DIAGNOSIS — G4700 Insomnia, unspecified: Secondary | ICD-10-CM

## 2011-12-31 LAB — CBC WITH DIFFERENTIAL/PLATELET
Basophils Absolute: 0.1 10*3/uL (ref 0.0–0.1)
EOS%: 2.4 % (ref 0.0–7.0)
HCT: 32.9 % — ABNORMAL LOW (ref 34.8–46.6)
HGB: 11.1 g/dL — ABNORMAL LOW (ref 11.6–15.9)
LYMPH%: 12.9 % — ABNORMAL LOW (ref 14.0–49.7)
MCH: 28 pg (ref 25.1–34.0)
MCV: 83.1 fL (ref 79.5–101.0)
MONO%: 6.4 % (ref 0.0–14.0)
NEUT%: 77.9 % — ABNORMAL HIGH (ref 38.4–76.8)
Platelets: 266 10*3/uL (ref 145–400)
lymph#: 3.7 10*3/uL — ABNORMAL HIGH (ref 0.9–3.3)

## 2011-12-31 MED ORDER — DIAZEPAM 5 MG PO TABS: ORAL_TABLET | ORAL | Status: DC

## 2011-12-31 NOTE — Telephone Encounter (Signed)
gve the pt her June,july 2013 appt calendar 

## 2011-12-31 NOTE — Progress Notes (Signed)
ID: WANNA GULLY   DOB: [birth year]  MR#: [id number]  CSN#:[id number]  HISTORY OF PRESENT ILLNESS: [name] [name] is a 71 year-old [place] woman I followed remotely for a history of breast cancer. More recently she noted that her belly was getting "bigger". Gradually she has developed increasing low back pain. After a period of several months, as her symptoms increased, she brought this problem to her primary physician's attention and a KUB was obtained on 10/07/2011, which was unremarkable. This was followed by a CT scan 10/15/2011, which showed ascites and omental caking. Paracentesis 10/18/2011 showed ([id number]) malignant cells consistent with metastatic adenocarcinoma, with equivocal staining for estrogen receptor. [name] pattern of additional stains was most suggestive of a primary gynecologic tumor. CA 125 at this time was 2051.  [name] patient was referred to gynecologic oncology and on 10/29/2011 underwent exploratory laparotomy under Drs. Cleda Mccreedy and Antionette Char. This consisted of a total abdominal hysterectomy with bilateral salpingo-oophorectomy, omentectomy, and radical tumor debulking. Unfortunately there was significant tumor attached to [name] diaphragm so [name] patient was suboptimally debulked. Accordingly a peritoneal catheter was not placed. GYN-ONC recommended 6 cycles of carboplatin/paclitaxel given every 3 weeks, with Neulasta on day 2 for granulocyte support. [name]'s subsequent history is as detailed below.  INTERVAL HISTORY:  [name] [name] returns today for assessment of chemotoxicity on day 8 cycle 2 of 6 planned q. three-week doses of carboplatin/paclitaxel being given for her ovarian carcinoma. She receives Neulasta on day 2 for granulocyte support. She continues to tolerate treatment well, and has few complaints today. In fact her biggest complaint continues to be insomnia, but she has found some relief but taking 8 diazepam tablet at night. She had no relief at all with [name]  lorazepam previously described.   REVIEW OF SYSTEMS:  [name] [name] had some mild constipation following treatment, but this then became diarrhea. She ended up taking a Lomotil, and [name] bowels have finally "returned to normal". There was no blood in [name] stool. No fevers, chills, or night sweats. Only some mild abdominal discomfort. She's had no nausea or emesis. She has some small oral ulcerations which she is treating with baking soda rinses and salt water rinses.  She also denies any chest pain or increased shortness of breath. No abnormal headaches or dizziness. She has some bony aches and pains following [name] Neulasta injection for which she takes Aleve and Claritin affectively. She's had no signs of numbness or tingling in [name] extremities. No skin changes or abnormal bleeding.  A detailed review of systems is otherwise noncontributory.   PAST MEDICAL HISTORY: Past Medical History  Diagnosis Date  . History of breast cancer   . COPD (chronic obstructive pulmonary disease)   . GERD (gastroesophageal reflux disease)   . Diabetes mellitus     type II  . Hx of colonic polyps   . Osteoarthritis   . MVP (mitral valve prolapse)   . Dysrhythmia     pt states runs a little fast   . Shortness of breath     with exertion   . Depression   . Anxiety   . Cancer     left breast  . Heart murmur     PAST SURGICAL HISTORY: Past Surgical History  Procedure Date  . Cesarean section 1980, 1982    X 2   . Laparoscopy     work up for infertility  . Tympanoplasty     left X 2  . Left breast fibroadenoma removal 1960's  . Breast cyst  aspirations   . Left modified radial mastectomy   . Laparotomy 10/29/2011    Procedure: EXPLORATORY LAPAROTOMY;  Surgeon: Rejeana Brock A. Duard Brady, MD;  Location: [facility];  Service: Gynecology;  Laterality: N/A;  . Abdominal hysterectomy 10/29/2011    Procedure: HYSTERECTOMY ABDOMINAL;  Surgeon: Rejeana Brock A. Duard Brady, MD;  Location: [facility];  Service: Gynecology;  Laterality: N/A;   Total abdominal hysterectomy  . Salpingoophorectomy 10/29/2011    Procedure: SALPINGO OOPHERECTOMY;  Surgeon: Rejeana Brock A. Duard Brady, MD;  Location: [facility];  Service: Gynecology;  Laterality: Bilateral;    FAMILY HISTORY Family History  Problem Relation Age of Onset  . Cancer [name]     endometrial/ colon  . Cancer Brother     colon  . COPD Other   . Hypertension Other    [name] [name] died at [name] age of 65. [name] [name] died at [name] age of 31 she had a history of colon cancer and endometrial cancer. [name] patient has no sisters. She has one brother, with a history of colon cancer. There is no history of breast cancer in [name] family. [name] patient is scheduled for genetic testing later this month.  GYNECOLOGIC HISTORY: She had menarche age 66. First pregnancy to term was at age 40. She is GX T2. She became menopausal at [name] time of her chemotherapy for breast cancer. This was age 34. 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 Her husband [name] runs a Psychologist, sport and exercise out of their own home. Daughter [name] [name], 20,  currently lives at home with [name] patient. There are some issues relating to this daughter that [name] patient discussed briefly. [name] [name]. 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 [name] patient has no grandchildren. She is not a Advice worker.   ADVANCED DIRECTIVES: not in place  HEALTH MAINTENANCE: History  Substance Use Topics  . Smoking status: Current Everyday Smoker -- 1.0 packs/day for 30 years    Types: Cigarettes  . Smokeless tobacco: Never Used   Comment: quit again 2009; restarted 1 ppd 2011  . Alcohol Use: No     Colonoscopy: 2009  PAP: s/p hysterectomy  Bone density: 2010. "good"  Mammography: 2012 NOV--unremarkable  No Known Allergies  Current Outpatient Prescriptions  Medication Sig Dispense Refill  . diazepam (VALIUM) 5 MG tablet       . acetaminophen (TYLENOL) 325 MG tablet Take 650  mg by mouth every 6 (six) hours as needed. For pain      . albuterol-ipratropium (COMBIVENT) 18-103 MCG/ACT inhaler Inhale 2 puffs into [name] lungs every 4 (four) hours as needed. For wheezing      . Alum & Mag Hydroxide-Simeth (MAGIC MOUTHWASH W/LIDOCAINE) SOLN Take 5 mLs by mouth 4 (four) times daily as needed.  240 mL  2  . aspirin EC 81 MG tablet Take 81 mg by mouth daily.      Marland Kitchen atenolol (TENORMIN) 50 MG tablet Take 25 mg by mouth 2 (two) times daily.      Marland Kitchen b complex vitamins tablet Take 1 tablet by mouth daily.        . budesonide-formoterol (SYMBICORT) 160-4.5 MCG/ACT inhaler Inhale 2 puffs into [name] lungs 2 (two) times daily.  1 Inhaler  5  . buPROPion (WELLBUTRIN SR) 150 MG 12 hr tablet Take 150 mg by mouth daily after breakfast.      . busPIRone (BUSPAR) 15 MG tablet Take 1 tablet (15 mg total) by mouth 2 (two)  times daily.  180 tablet  1  . cholecalciferol (VITAMIN D) 1000 UNITS tablet Take 1,000 Units by mouth daily.        Marland Kitchen dexamethasone (DECADRON) 4 MG tablet Take 2 tablets by mouth two times a day starting [name] day after chemotherapy for 3 days.  30 tablet  1  . diphenhydrAMINE (SOMINEX) 25 MG tablet Take 25 mg by mouth at bedtime as needed. For sleep      . glucose blood (ONE TOUCH TEST STRIPS) test strip Use as instructed  100 each  3  . ipratropium-albuterol (DUONEB) 0.5-2.5 (3) MG/3ML SOLN Inhale 3 mLs into [name] lungs 3 (three) times daily.       Marland Kitchen lidocaine-prilocaine (EMLA) cream Apply topically as needed.  60 g  3  . loratadine (CLARITIN) 10 MG tablet Take 10 mg by mouth daily.      Marland Kitchen lovastatin (MEVACOR) 40 MG tablet Take 1 tablet (40 mg total) by mouth at bedtime.  90 tablet  2  . metFORMIN (GLUCOPHAGE) 1000 MG tablet Take 1 tablet (1,000 mg total) by mouth 2 (two) times daily with a meal.  180 tablet  3  . naproxen sodium (ANAPROX) 220 MG tablet Take 220 mg by mouth 2 (two) times daily with a meal.      . omeprazole (PRILOSEC) 40 MG capsule Take 1 capsule (40 mg total) by  mouth daily.  90 capsule  3  . ondansetron (ZOFRAN) 8 MG tablet Take 1 tab two times a day starting [name] day after chemo for 3 days. Then take 1 tab two times a day as needed for nausea or vomiting.  30 tablet  2  . oxyCODONE-acetaminophen (ROXICET) 5-325 MG per tablet Take 1 tablet by mouth every 4 (four) hours as needed for pain.  40 tablet  0  . oxymetazoline (AFRIN) 0.05 % nasal spray Place 2 sprays into [name] nose 2 (two) times daily as needed. Allergies       . prochlorperazine (COMPAZINE) 10 MG tablet Take 1 tablet (10 mg total) by mouth every 6 (six) hours as needed (Nausea or vomiting).  30 tablet  3  . ranitidine (ZANTAC) 150 MG tablet Take 150 mg by mouth 2 (two) times daily as needed. Heart burn       . repaglinide (PRANDIN) 2 MG tablet Take 2 mg by mouth 3 (three) times daily as needed. blood glucose- lowering      . sertraline (ZOLOFT) 100 MG tablet Take 1 tablet (100 mg total) by mouth daily before breakfast.  90 tablet  2  . traZODone (DESYREL) 50 MG tablet Take 1 tablet (50 mg total) by mouth at bedtime.  30 tablet  4  . DISCONTD: roflumilast (DALIRESP) 500 MCG TABS tablet Take 500 mcg by mouth daily.          OBJECTIVE: Middle-aged white woman who appears well Filed Vitals:   12/31/11 1031  BP: 118/71  Pulse: 99  Temp: 98.3 F (36.8 C)     Body mass index is 21.01 kg/(m^2).    ECOG FS: 1 Physical Exam: HEENT:  Sclerae anicteric, conjunctivae pink.  Oropharynx shows mild mucositis, with oral ulcerations along [name] inner bottom lip and left buccal mucosa. No candidiasis noted.  Nodes:  No peripheral lymphadenopathy palpated today. Breast Exam:  Deferred  Lungs:  Clear to auscultation bilaterally.  No crackles, rhonchi, or wheezes.   Heart:  Regular rate and rhythm.   Abdomen:  Soft, no distention, nontender to gentle palpation.  Positive bowel sounds.  No organomegaly or masses palpated.   Musculoskeletal:  No focal spinal tenderness to palpation. Port is intact in [name] right  upper chest wall with no erythema, edema, or evidence of infection.  Extremities:  Benign.  No peripheral edema or cyanosis.   Neuro:  Nonfocal. Alert and oriented x3.    LAB RESULTS: Lab Results  Component Value Date   WBC 28.2* 12/31/2011   NEUTROABS 22.0* 12/31/2011   HGB 11.1* 12/31/2011   HCT 32.9* 12/31/2011   MCV 83.1 12/31/2011   PLT 266 12/31/2011      Chemistry      Component Value Date/Time   NA 138 12/24/2011 0807   K 3.9 12/24/2011 0807   CL 101 12/24/2011 0807   CO2 27 12/24/2011 0807   BUN 15 12/24/2011 0807   CREATININE 0.59 12/24/2011 0807      Component Value Date/Time   CALCIUM 9.2 12/24/2011 0807   ALKPHOS 102 12/24/2011 0807   AST 17 12/24/2011 0807   ALT 18 12/24/2011 0807   BILITOT 0.2* 12/24/2011 0807     CA125 on 12/02/2011 was 496.1, down from 838.5 on 11/15/2011, and over 2000 on 10/16/2011.  STUDIES: Chest 2 View  10/24/2011  *RADIOLOGY REPORT*  Clinical Data: Preoperative evaluation.  Recent diagnosis of ovarian cancer.  Prior history of breast cancer with mastectomy in 1993.  30 pack year history of smoking with COPD  CHEST - 2 VIEW  Comparison: 01/17/2010  Findings: [name] patient is status post left mastectomy and left axillary node dissection.  Hyperinflation is stable with underlying COPD and prominence of [name] interstitial markings correlating with underlying bronchitic change.  [name] cardiomediastinal silhouette is stable with heart size upper limits of normal.  [name] lung fields appear clear with no signs of focal infiltrate or congestive failure.  No pleural fluid or significant peribronchial cuffing is identified.  No focal nodularity is seen to suggest [name] presence of metastatic disease.  Bony structures appear intact.  IMPRESSION: Stable COPD changes.  No new worrisome focal or acute abnormality identified.  Original Report Authenticated By: Bertha Stakes, M.D.    ASSESSMENT: 71 year old [place] woman  (1) status post left modified radical  mastectomy remotely (we will try to retrieve those records), treated adjuvantly with CMF chemotherapy, with no evidence of recurrence  (2) now status post TAH BSO, omentectomy, and suboptimal debulking 10/29/2011 for a high-grade serous adenocarcinoma of [name] ovary, pT3c NX (Stage IIIC) receiving chemotherapy with q. three-week doses of carboplatin/paclitaxel, with Neulasta on day 2 for granulocyte support.   (3) COPD with ongoing tobacco abuse  (4) diabetes mellitus  PLAN:  Marisue continues to tolerate chemotherapy reasonably well. She'll continue in 2 weeks for followup on June 11 in anticipation of her third dose of chemotherapy. We will continue with her current regimen of dexamethasone with cycle 3. She continues to have a lot of difficulty sleeping, and as noted above has found [name] only thing that helps is low dose diazepam at night. I have discontinued her lorazepam and refill diazepam 5 mg at night when necessary sleep, dispense #30 with no refills.  [name] plan is to get through of 6 cycles of chemotherapy with a minimum of toxicity. Her next CA 125 has been scheduled for June 11.  Kameron Blethen    12/31/2011

## 2012-01-07 ENCOUNTER — Telehealth: Payer: Self-pay | Admitting: Genetic Counselor

## 2012-01-07 ENCOUNTER — Ambulatory Visit (INDEPENDENT_AMBULATORY_CARE_PROVIDER_SITE_OTHER): Payer: Medicare Other | Admitting: Internal Medicine

## 2012-01-07 ENCOUNTER — Encounter: Payer: Self-pay | Admitting: Internal Medicine

## 2012-01-07 VITALS — BP 124/62 | HR 100 | Temp 97.6°F | Resp 16 | Wt 124.0 lb

## 2012-01-07 DIAGNOSIS — F329 Major depressive disorder, single episode, unspecified: Secondary | ICD-10-CM

## 2012-01-07 DIAGNOSIS — R180 Malignant ascites: Secondary | ICD-10-CM

## 2012-01-07 DIAGNOSIS — C569 Malignant neoplasm of unspecified ovary: Secondary | ICD-10-CM

## 2012-01-07 DIAGNOSIS — K5900 Constipation, unspecified: Secondary | ICD-10-CM

## 2012-01-07 DIAGNOSIS — J449 Chronic obstructive pulmonary disease, unspecified: Secondary | ICD-10-CM

## 2012-01-07 DIAGNOSIS — E119 Type 2 diabetes mellitus without complications: Secondary | ICD-10-CM

## 2012-01-07 MED ORDER — LINACLOTIDE 290 MCG PO CAPS: 1.0000 | ORAL_CAPSULE | Freq: Every day | ORAL | Status: DC | PRN

## 2012-01-07 MED ORDER — REPAGLINIDE 2 MG PO TABS: 2.0000 mg | ORAL_TABLET | Freq: Three times a day (TID) | ORAL | Status: DC | PRN

## 2012-01-07 NOTE — Assessment & Plan Note (Signed)
Continue with current prescription therapy as reflected on the Med list.  

## 2012-01-07 NOTE — Assessment & Plan Note (Signed)
improved

## 2012-01-07 NOTE — Assessment & Plan Note (Signed)
Due to chemo Try Linzess prn

## 2012-01-07 NOTE — Assessment & Plan Note (Signed)
On chemo 

## 2012-01-07 NOTE — Progress Notes (Signed)
Patient ID: [name] [name], female   DOB: 1941-06-14, 71 y.o.   MRN: [id number] Patient ID: [name] [name], female   DOB: 1940-10-24, 71 y.o.   MRN: [id number]  Subjective:    Patient ID: [name] [name], female    DOB: 20-Jun-1941, 71 y.o.   MRN: [id number]  HPI  F/u abd tumor and ascitis - s/p debulking; f/u on DM, HTN No new abd pain C/o constipation post chemo  Wt Readings from Last 3 Encounters:  01/07/12 124 lb (56.246 kg)  12/31/11 122 lb 6.4 oz (55.52 kg)  12/24/11 122 lb 6.4 oz (55.52 kg)   BP Readings from Last 3 Encounters:  01/07/12 124/62  12/31/11 118/71  12/25/11 116/68      Review of Systems  Constitutional: Positive for fatigue and unexpected weight change. Negative for chills, activity change and appetite change.  HENT: Negative for congestion, mouth sores and sinus pressure.   Eyes: Negative for visual disturbance.  Respiratory: Positive for shortness of breath. Negative for cough and chest tightness.   Gastrointestinal: Positive for diarrhea and abdominal distention. Negative for nausea and abdominal pain.  Genitourinary: Negative for frequency, difficulty urinating and vaginal pain.  Musculoskeletal: Negative for back pain and gait problem.  Skin: Negative for pallor and rash.  Neurological: Negative for dizziness, tremors, weakness, numbness and headaches.  Psychiatric/Behavioral: Negative for confusion and sleep disturbance.       Objective:   Physical Exam  Constitutional: She appears well-developed. No distress.  HENT:  Head: Normocephalic.  Right Ear: External ear normal.  Left Ear: External ear normal.  Nose: Nose normal.  Mouth/Throat: Oropharynx is clear and moist.  Eyes: Conjunctivae are normal. Pupils are equal, round, and reactive to light. Right eye exhibits no discharge. Left eye exhibits no discharge.  Neck: Normal range of motion. Neck supple. No JVD present. No tracheal deviation present. No thyromegaly present.  Cardiovascular:  Normal rate, regular rhythm and normal heart sounds.   Pulmonary/Chest: No stridor. No respiratory distress. She has no wheezes.  Abdominal: Soft. Bowel sounds are normal. She exhibits distension. She exhibits no mass. There is no tenderness. There is no rebound and no guarding.  Musculoskeletal: She exhibits no edema and no tenderness.  Lymphadenopathy:    She has no cervical adenopathy.  Neurological: She displays normal reflexes. No cranial nerve deficit. She exhibits normal muscle tone. Coordination normal.  Skin: No rash noted. No erythema.  Psychiatric: She has a normal mood and affect. Her behavior is normal. Judgment and thought content normal.   Lab Results  Component Value Date   WBC 28.2* 12/31/2011   HGB 11.1* 12/31/2011   HCT 32.9* 12/31/2011   PLT 266 12/31/2011   GLUCOSE 117* 12/24/2011   CHOL 170 08/30/2010   TRIG 105.0 08/30/2010   HDL 68.00 08/30/2010   LDLDIRECT 108.0 01/16/2010   LDLCALC 81 08/30/2010   ALT 18 12/24/2011   AST 17 12/24/2011   NA 138 12/24/2011   K 3.9 12/24/2011   CL 101 12/24/2011   CREATININE 0.59 12/24/2011   BUN 15 12/24/2011   CO2 27 12/24/2011   TSH 3.30 08/30/2010   INR 0.93 11/21/2011   HGBA1C 7.5* 10/04/2011   MICROALBUR 1.0 08/30/2010         Assessment & Plan:

## 2012-01-07 NOTE — Telephone Encounter (Signed)
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  We have made an appointment to discuss on 6/11 at 11 AM.

## 2012-01-11 ENCOUNTER — Other Ambulatory Visit: Payer: Self-pay | Admitting: Oncology

## 2012-01-14 ENCOUNTER — Encounter: Payer: Self-pay | Admitting: Physician Assistant

## 2012-01-14 ENCOUNTER — Ambulatory Visit: Payer: Medicare Other | Admitting: Physician Assistant

## 2012-01-14 ENCOUNTER — Other Ambulatory Visit (HOSPITAL_BASED_OUTPATIENT_CLINIC_OR_DEPARTMENT_OTHER): Payer: Medicare Other | Admitting: Lab

## 2012-01-14 ENCOUNTER — Encounter: Payer: Self-pay | Admitting: Genetic Counselor

## 2012-01-14 ENCOUNTER — Ambulatory Visit (HOSPITAL_BASED_OUTPATIENT_CLINIC_OR_DEPARTMENT_OTHER): Payer: Medicare Other

## 2012-01-14 ENCOUNTER — Ambulatory Visit (HOSPITAL_BASED_OUTPATIENT_CLINIC_OR_DEPARTMENT_OTHER): Payer: Medicare Other | Admitting: Physician Assistant

## 2012-01-14 ENCOUNTER — Ambulatory Visit (HOSPITAL_BASED_OUTPATIENT_CLINIC_OR_DEPARTMENT_OTHER): Payer: Medicare Other | Admitting: Genetic Counselor

## 2012-01-14 VITALS — BP 114/66 | HR 87 | Temp 97.9°F | Ht 64.0 in | Wt 123.3 lb

## 2012-01-14 DIAGNOSIS — C569 Malignant neoplasm of unspecified ovary: Secondary | ICD-10-CM

## 2012-01-14 DIAGNOSIS — Z5111 Encounter for antineoplastic chemotherapy: Secondary | ICD-10-CM

## 2012-01-14 DIAGNOSIS — Z1589 Genetic susceptibility to other disease: Secondary | ICD-10-CM

## 2012-01-14 DIAGNOSIS — Z853 Personal history of malignant neoplasm of breast: Secondary | ICD-10-CM

## 2012-01-14 DIAGNOSIS — K5900 Constipation, unspecified: Secondary | ICD-10-CM

## 2012-01-14 DIAGNOSIS — Z808 Family history of malignant neoplasm of other organs or systems: Secondary | ICD-10-CM

## 2012-01-14 LAB — CBC WITH DIFFERENTIAL/PLATELET
BASO%: 0.4 % (ref 0.0–2.0)
Eosinophils Absolute: 0.1 10*3/uL (ref 0.0–0.5)
LYMPH%: 19.9 % (ref 14.0–49.7)
MCHC: 33.8 g/dL (ref 31.5–36.0)
MONO#: 1 10*3/uL — ABNORMAL HIGH (ref 0.1–0.9)
NEUT#: 9.7 10*3/uL — ABNORMAL HIGH (ref 1.5–6.5)
Platelets: 366 10*3/uL (ref 145–400)
RBC: 3.99 10*6/uL (ref 3.70–5.45)
RDW: 17.8 % — ABNORMAL HIGH (ref 11.2–14.5)
WBC: 13.6 10*3/uL — ABNORMAL HIGH (ref 3.9–10.3)
lymph#: 2.7 10*3/uL (ref 0.9–3.3)

## 2012-01-14 LAB — COMPREHENSIVE METABOLIC PANEL
ALT: 16 U/L (ref 0–35)
Albumin: 4 g/dL (ref 3.5–5.2)
CO2: 25 mEq/L (ref 19–32)
Calcium: 9.8 mg/dL (ref 8.4–10.5)
Chloride: 101 mEq/L (ref 96–112)
Glucose, Bld: 97 mg/dL (ref 70–99)
Potassium: 3.8 mEq/L (ref 3.5–5.3)
Sodium: 139 mEq/L (ref 135–145)
Total Bilirubin: 0.2 mg/dL — ABNORMAL LOW (ref 0.3–1.2)
Total Protein: 7.6 g/dL (ref 6.0–8.3)

## 2012-01-14 LAB — CA 125: CA 125: 118.9 U/mL — ABNORMAL HIGH (ref 0.0–30.2)

## 2012-01-14 MED ORDER — DIPHENHYDRAMINE HCL 50 MG/ML IJ SOLN
25.0000 mg | Freq: Once | INTRAMUSCULAR | Status: AC
  Administered 2012-01-14: 25 mg via INTRAVENOUS

## 2012-01-14 MED ORDER — SODIUM CHLORIDE 0.9 % IV SOLN
Freq: Once | INTRAVENOUS | Status: AC
  Administered 2012-01-14: 13:00:00 via INTRAVENOUS

## 2012-01-14 MED ORDER — ONDANSETRON 16 MG/50ML IVPB (CHCC)
16.0000 mg | Freq: Once | INTRAVENOUS | Status: AC
  Administered 2012-01-14: 16 mg via INTRAVENOUS

## 2012-01-14 MED ORDER — FAMOTIDINE IN NACL 20-0.9 MG/50ML-% IV SOLN
20.0000 mg | Freq: Once | INTRAVENOUS | Status: AC
  Administered 2012-01-14: 20 mg via INTRAVENOUS

## 2012-01-14 MED ORDER — SODIUM CHLORIDE 0.9 % IJ SOLN
10.0000 mL | INTRAMUSCULAR | Status: DC | PRN
  Administered 2012-01-14: 10 mL
  Filled 2012-01-14: qty 10

## 2012-01-14 MED ORDER — DEXAMETHASONE SODIUM PHOSPHATE 4 MG/ML IJ SOLN
20.0000 mg | Freq: Once | INTRAMUSCULAR | Status: AC
  Administered 2012-01-14: 20 mg via INTRAVENOUS

## 2012-01-14 MED ORDER — HEPARIN SOD (PORK) LOCK FLUSH 100 UNIT/ML IV SOLN
500.0000 [IU] | Freq: Once | INTRAVENOUS | Status: AC | PRN
  Administered 2012-01-14: 500 [IU]
  Filled 2012-01-14: qty 5

## 2012-01-14 MED ORDER — PACLITAXEL CHEMO INJECTION 300 MG/50ML
175.0000 mg/m2 | Freq: Once | INTRAVENOUS | Status: AC
  Administered 2012-01-14: 276 mg via INTRAVENOUS
  Filled 2012-01-14: qty 46

## 2012-01-14 MED ORDER — SODIUM CHLORIDE 0.9 % IV SOLN
485.0000 mg | Freq: Once | INTRAVENOUS | Status: AC
  Administered 2012-01-14: 490 mg via INTRAVENOUS
  Filled 2012-01-14: qty 49

## 2012-01-14 NOTE — Progress Notes (Signed)
[name] [name], a 71 y.o. female, for discussion of her genetic test results. Her husband accompanied her today.  HISTORY OF PRESENT ILLNESS: In May 2013, [name] [name] had blood drawn for the BRCA1 and BRCA2 genetic test performed by [facility].  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    Past Medical History  Diagnosis Date  . History of breast cancer   . COPD (chronic obstructive pulmonary disease)   . GERD (gastroesophageal reflux disease)   . Diabetes mellitus     type II  . Hx of colonic polyps   . Osteoarthritis   . MVP (mitral valve prolapse)   . Dysrhythmia     pt states runs a little fast   . Shortness of breath     with exertion   . Depression   . Anxiety   . Cancer     left breast  . Heart murmur     Past Surgical History  Procedure Date  . Cesarean section 1980, 1982    X 2   . Laparoscopy     work up for infertility  . Tympanoplasty     left X 2  . Left breast fibroadenoma removal 1960's  . Breast cyst aspirations   . Left modified radial mastectomy   . Laparotomy 10/29/2011    Procedure: EXPLORATORY LAPAROTOMY;  Surgeon: Rejeana Brock A. Duard Brady, MD;  Location: [facility];  Service: Gynecology;  Laterality: N/A;  . Abdominal hysterectomy 10/29/2011    Procedure: HYSTERECTOMY ABDOMINAL;  Surgeon: Rejeana Brock A. Duard Brady, MD;  Location: [facility];  Service: Gynecology;  Laterality: N/A;  Total abdominal hysterectomy  . Salpingoophorectomy 10/29/2011    Procedure: SALPINGO OOPHERECTOMY;  Surgeon: Rejeana Brock A. Duard Brady, MD;  Location: [facility];  Service: Gynecology;  Laterality: Bilateral;    History  Substance Use Topics  . Smoking status: Current Everyday Smoker -- 1.0 packs/day for 30 years    Types: Cigarettes  . Smokeless tobacco: Never Used   Comment: quit again 2009; restarted 1 ppd 2011  . Alcohol Use: No    FAMILY HISTORY:  We obtained a detailed, 4-generation family history.  Significant diagnoses are listed below: Family  History  Problem Relation Age of Onset  . Cancer Mother     endometrial/ colon  . Cancer Brother     colon  . COPD Other   . Hypertension Other   The patient was diagnosed with breast cancer at age 58 and then with ovarian cancer at age 45.  She has one brother with colon cancer diagnosed around age 73.  Her mother was diagnosed with both colon cancer and endometrial cancer at age 64.  She had one brother who was diagnosed with prostate cancer around age 56, and then ultimately developed bladder cancer and lung cancer and died around age 29.  The patient's maternal grandmother died of salivary gland cancer at age 41.  The patient's father passed away at age 55.  He had eight brothers and sisters.  One sister had an unknown abdominal cancer and another sister had leukemia.  The majority of this side of the family is in the [place], and the patient did not keep in close contact with the family.  Patient's maternal ancestors are of [place] descent, and paternal ancestors are of [place] descent. There is no reported Ashkenazi Jewish ancestry. There is no known consanguinity.  GENETIC COUNSELING RISK ASSESSMENT, DISCUSSION, AND SUGGESTED FOLLOW UP: We reviewed the natural history and genetic etiology of  sporadic, familial and hereditary cancer syndromes.  We also discussed the cancers in the family and how the patient's personal history is suggestive of hereditary breast and ovarian cancer syndrome, yet the family history is more suggestive of Lynch syndrome.  The patient reported that her mother was seen at [facility] for her surgery and asked that we try to get the pathology report to confirm that she had uterine cancer as well.  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  According to [facility], this is the first time they have seen this particular variant.  We discussed that the patient does not know a lot of information on her fathers side of the family, as that family is in the  [place], and she lost touch with them.  At this time, we will not change the management of the patient based on this result.  However, she was given the family history forms to complete on her family for [employer], so that they may be able to gather more information.  We are currently awaiting results on the patient's CancerNext cancer panel.  We anticipate results back sometime in August or September.  This panel will provide information on other gene mutations that are seen to increase the risk for breast and ovarian cancer, as well as colon cancer syndromes, such as Lynch syndrome.  Per the patient's request, we will contact her by telephone to discuss the results from the panel test. A follow up genetic counseling visit will be scheduled if indicated.  The patient was seen for a total of 30 minutes, greater than 50% of which was spent face-to-face counseling.  This plan is being carried out per Dr. Darrall Dears recommendations.  This note will also be sent to the referring provider via the electronic medical record. The patient will be supplied with a summary of this genetic counseling discussion as well as educational information on the discussed hereditary cancer syndromes following the conclusion of their visit.   Patient was discussed with Dr. Drue Second.  EDUCATIONAL INFORMATION SUPPLIED TO PATIENT AT ENCOUNTER:  [facility] genetic test results Family history forms for the variant program at [facility]   _______________________________________________________________________ For Office Staff:  Number of people involved in session: 3 Was an Intern/ student involved with case: not applicable

## 2012-01-14 NOTE — Patient Instructions (Signed)
[facility] Discharge Instructions for Patients Receiving Chemotherapy  Today you received the following chemotherapy agents Taxol and Carboplatin.  To help prevent nausea and vomiting after your treatment, we encourage you to take your nausea medication as prescribed.   If you develop nausea and vomiting that is not controlled by your nausea medication, call the clinic. If it is after clinic hours your family physician or the after hours number for the clinic or go to the Emergency Department.   BELOW ARE SYMPTOMS THAT SHOULD BE REPORTED IMMEDIATELY:  *FEVER GREATER THAN 100.5 F  *CHILLS WITH OR WITHOUT FEVER  NAUSEA AND VOMITING THAT IS NOT CONTROLLED WITH YOUR NAUSEA MEDICATION  *UNUSUAL SHORTNESS OF BREATH  *UNUSUAL BRUISING OR BLEEDING  TENDERNESS IN MOUTH AND THROAT WITH OR WITHOUT PRESENCE OF ULCERS  *URINARY PROBLEMS  *BOWEL PROBLEMS  UNUSUAL RASH Items with * indicate a potential emergency and should be followed up as soon as possible.  One of the nurses will contact you 24 hours after your treatment. Please let the nurse know about any problems that you may have experienced. Feel free to call the clinic you have any questions or concerns. The clinic phone number is [phone].   I have been informed and understand all the instructions given to me. I know to contact the clinic, my physician, or go to the Emergency Department if any problems should occur. I do not have any questions at this time, but understand that I may call the clinic during office hours   should I have any questions or need assistance in obtaining follow up care.    __________________________________________  _____________  __________ Signature of Patient or Authorized Representative            Date                   Time    __________________________________________ Nurse's Signature    

## 2012-01-14 NOTE — Progress Notes (Signed)
ID: [name] [name]   DOB: Nov 06, 1940  MR#: [id number]  CSN#:[id number]  HISTORY OF PRESENT ILLNESS: [name] [name] is a 71 year-old [place] woman I followed remotely for a history of breast cancer. More recently she noted that her belly was getting "bigger". Gradually she has developed increasing low back pain. After a period of several months, as her symptoms increased, she brought this problem to her primary physician's attention and a KUB was obtained on 10/07/2011, which was unremarkable. This was followed by a CT scan 10/15/2011, which showed ascites and omental caking. Paracentesis 10/18/2011 showed ([id number]) malignant cells consistent with metastatic adenocarcinoma, with equivocal staining for estrogen receptor. The pattern of additional stains was most suggestive of a primary gynecologic tumor. CA 125 at this time was 2051.  The patient was referred to gynecologic oncology and on 10/29/2011 underwent exploratory laparotomy under Drs. [name] [name] and [name] [name]. This consisted of a total abdominal hysterectomy with bilateral salpingo-oophorectomy, omentectomy, and radical tumor debulking. Unfortunately there was significant tumor attached to the diaphragm so the patient was suboptimally debulked. Accordingly a peritoneal catheter was not placed. GYN-ONC recommended 6 cycles of carboplatin/paclitaxel given every 3 weeks, with Neulasta on day 2 for granulocyte support. [name] [name]'s subsequent history is as detailed below.  INTERVAL HISTORY:  [name] [name] returns today accompanied by her husband, due for day 1 cycle 3 of 6 planned q. three-week doses of carboplatin/paclitaxel being given for her ovarian carcinoma. She receives Neulasta on day 2 for granulocyte support. She tolerates treatment reasonably well, but does have problems with constipation for the first few days following chemotherapy. At this point, but constipation has resolved, and [name] [name] really has very few complaints. She denies any  abdominal or pelvic pain.  Interval history is remarkable for [name] [name] having genetic testing which showed a genetic variants of uncertain significance. This is being followed by a genetics counselor, [name] [name], and the plan is to obtain additional labs for additional testing. She met with the patient earlier today in fact.    REVIEW OF SYSTEMS:  [name] [name] has had no fevers, chills, or night sweats. She's had no nausea or emesis. She has had some small oral ulcerations which resolve with salt water rinses or baking soda rinses. She also denies any chest pain or increased shortness of breath. No abnormal headaches or dizziness. She has some bony aches and pains following the Neulasta injection for which she takes Aleve and Claritin affectively. She's had no signs of numbness or tingling in the extremities. No skin changes or abnormal bleeding.  A detailed review of systems is otherwise noncontributory.   PAST MEDICAL HISTORY: Past Medical History  Diagnosis Date  . History of breast cancer   . COPD (chronic obstructive pulmonary disease)   . GERD (gastroesophageal reflux disease)   . Diabetes mellitus     type II  . Hx of colonic polyps   . Osteoarthritis   . MVP (mitral valve prolapse)   . Dysrhythmia     pt states runs a little fast   . Shortness of breath     with exertion   . Depression   . Anxiety   . Cancer     left breast  . Heart murmur     PAST SURGICAL HISTORY: Past Surgical History  Procedure Date  . Cesarean section 1980, 1982    X 2   . Laparoscopy     work up for infertility  . Tympanoplasty     left X 2  . Left  breast fibroadenoma removal 1960's  . Breast cyst aspirations   . Left modified radial mastectomy   . Laparotomy 10/29/2011    Procedure: EXPLORATORY LAPAROTOMY;  Surgeon: [name] Brock A. Duard Brady, MD;  Location: [facility];  Service: Gynecology;  Laterality: N/A;  . Abdominal hysterectomy 10/29/2011    Procedure: HYSTERECTOMY ABDOMINAL;  Surgeon: [name] Brock A. Duard Brady,  MD;  Location: [facility];  Service: Gynecology;  Laterality: N/A;  Total abdominal hysterectomy  . Salpingoophorectomy 10/29/2011    Procedure: SALPINGO OOPHERECTOMY;  Surgeon: [name] Brock A. Duard Brady, MD;  Location: [facility];  Service: Gynecology;  Laterality: Bilateral;    FAMILY HISTORY Family History  Problem Relation Age of Onset  . Cancer Mother     endometrial/ colon  . Cancer Brother     colon  . COPD Other   . Hypertension Other    the patient's father died at the age of 20. The patient's mother died at the age of 35 she had a history of colon cancer and endometrial cancer. The patient has no sisters. She has one brother, with a history of colon cancer. There is no history of breast cancer in the family. The patient is scheduled for genetic testing later this month.  GYNECOLOGIC HISTORY: She had menarche age 70. First pregnancy to term was at age 49. She is GX T2. She became menopausal at the time of her chemotherapy for breast cancer. This was age 60. 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 Her husband [name] [name] runs a Psychologist, sport and exercise out of their own home. Daughter [name] [name], 54,  currently lives at home with the patient. There are some issues relating to this daughter that the patient discussed briefly. [place]. [name] [name], 30, is an ED nurse at [employer]. The patient has no grandchildren. She is not a Advice worker.   ADVANCED DIRECTIVES: not in place  HEALTH MAINTENANCE: History  Substance Use Topics  . Smoking status: Current Everyday Smoker -- 1.0 packs/day for 30 years    Types: Cigarettes  . Smokeless tobacco: Never Used   Comment: quit again 2009; restarted 1 ppd 2011  . Alcohol Use: No     Colonoscopy: 2009  PAP: s/p hysterectomy  Bone density: 2010. "good"  Mammography: 2012 NOV--unremarkable  No Known Allergies  Current Outpatient Prescriptions  Medication Sig Dispense Refill  . acetaminophen  (TYLENOL) 325 MG tablet Take 650 mg by mouth every 6 (six) hours as needed. For pain      . albuterol-ipratropium (COMBIVENT) 18-103 MCG/ACT inhaler Inhale 2 puffs into the lungs every 4 (four) hours as needed. For wheezing      . Alum & Mag Hydroxide-Simeth (MAGIC MOUTHWASH W/LIDOCAINE) SOLN Take 5 mLs by mouth 4 (four) times daily as needed.  240 mL  2  . aspirin EC 81 MG tablet Take 81 mg by mouth daily.      Marland Kitchen atenolol (TENORMIN) 50 MG tablet Take 25 mg by mouth 2 (two) times daily.      Marland Kitchen b complex vitamins tablet Take 1 tablet by mouth daily.        . budesonide-formoterol (SYMBICORT) 160-4.5 MCG/ACT inhaler Inhale 2 puffs into the lungs 2 (two) times daily.  1 Inhaler  5  . buPROPion (WELLBUTRIN SR) 150 MG 12 hr tablet Take 150 mg by mouth daily after breakfast.      . busPIRone (BUSPAR) 15 MG tablet Take 1 tablet (15 mg total) by mouth 2 (two) times daily.  180  tablet  1  . cholecalciferol (VITAMIN D) 1000 UNITS tablet Take 1,000 Units by mouth daily.        Marland Kitchen dexamethasone (DECADRON) 4 MG tablet Take 2 tablets by mouth two times a day starting the day after chemotherapy for 3 days.  30 tablet  1  . diazepam (VALIUM) 5 MG tablet 1-2 tabs PO QHS PRN sleep  30 tablet  0  . diphenhydrAMINE (SOMINEX) 25 MG tablet Take 25 mg by mouth at bedtime as needed. For sleep      . glucose blood (ONE TOUCH TEST STRIPS) test strip Use as instructed  100 each  3  . ipratropium-albuterol (DUONEB) 0.5-2.5 (3) MG/3ML SOLN Inhale 3 mLs into the lungs 3 (three) times daily.       Marland Kitchen lidocaine-prilocaine (EMLA) cream Apply topically as needed.  60 g  3  . Linaclotide (LINZESS) 290 MCG CAPS Take 1 each by mouth daily as needed.  30 capsule  11  . loratadine (CLARITIN) 10 MG tablet Take 10 mg by mouth daily.      Marland Kitchen lovastatin (MEVACOR) 40 MG tablet Take 1 tablet (40 mg total) by mouth at bedtime.  90 tablet  2  . metFORMIN (GLUCOPHAGE) 1000 MG tablet Take 1 tablet (1,000 mg total) by mouth 2 (two) times daily with  a meal.  180 tablet  3  . naproxen sodium (ANAPROX) 220 MG tablet Take 220 mg by mouth 2 (two) times daily with a meal.      . omeprazole (PRILOSEC) 40 MG capsule Take 1 capsule (40 mg total) by mouth daily.  90 capsule  3  . ondansetron (ZOFRAN) 8 MG tablet Take 1 tab two times a day starting the day after chemo for 3 days. Then take 1 tab two times a day as needed for nausea or vomiting.  30 tablet  2  . oxyCODONE-acetaminophen (ROXICET) 5-325 MG per tablet Take 1 tablet by mouth every 4 (four) hours as needed for pain.  40 tablet  0  . oxymetazoline (AFRIN) 0.05 % nasal spray Place 2 sprays into the nose 2 (two) times daily as needed. Allergies       . prochlorperazine (COMPAZINE) 10 MG tablet Take 1 tablet (10 mg total) by mouth every 6 (six) hours as needed (Nausea or vomiting).  30 tablet  3  . ranitidine (ZANTAC) 150 MG tablet Take 150 mg by mouth 2 (two) times daily as needed. Heart burn       . repaglinide (PRANDIN) 2 MG tablet Take 1 tablet (2 mg total) by mouth 3 (three) times daily as needed. blood glucose- lowering  90 tablet  11  . sertraline (ZOLOFT) 100 MG tablet Take 1 tablet (100 mg total) by mouth daily before breakfast.  90 tablet  2  . traZODone (DESYREL) 50 MG tablet Take 1 tablet (50 mg total) by mouth at bedtime.  30 tablet  4  . DISCONTD: roflumilast (DALIRESP) 500 MCG TABS tablet Take 500 mcg by mouth daily.          OBJECTIVE: Middle-aged white woman who appears well and in no acute distress. Filed Vitals:   01/14/12 1138  BP: 114/66  Pulse: 87  Temp: 97.9 F (36.6 C)     Body mass index is 21.16 kg/(m^2).    ECOG FS: 1  Filed Weights   01/14/12 1138  Weight: 123 lb 4.8 oz (55.929 kg)   Physical Exam: HEENT:  Sclerae anicteric, conjunctivae pink.  Oropharynx benign. No candidiasis  noted.  Nodes:  No peripheral lymphadenopathy palpated today. Breast Exam:  Deferred  Lungs:  Clear to auscultation bilaterally.  No crackles, rhonchi, or wheezes.   Heart:   Regular rate and rhythm.   Abdomen:  Soft, thin, no distention, nontender to gentle palpation.  Positive bowel sounds.  No organomegaly or masses palpated.   Musculoskeletal:  No focal spinal tenderness to palpation. Port is intact in the right upper chest wall with no erythema, edema, or evidence of infection.  Extremities:  Benign.  No peripheral edema or cyanosis.   Neuro:  Nonfocal. Alert and oriented x3.    LAB RESULTS: Lab Results  Component Value Date   WBC 13.6* 01/14/2012   NEUTROABS 9.7* 01/14/2012   HGB 11.4* 01/14/2012   HCT 33.7* 01/14/2012   MCV 84.5 01/14/2012   PLT 366 01/14/2012      Chemistry      Component Value Date/Time   NA 138 12/24/2011 0807   K 3.9 12/24/2011 0807   CL 101 12/24/2011 0807   CO2 27 12/24/2011 0807   BUN 15 12/24/2011 0807   CREATININE 0.59 12/24/2011 0807      Component Value Date/Time   CALCIUM 9.2 12/24/2011 0807   ALKPHOS 102 12/24/2011 0807   AST 17 12/24/2011 0807   ALT 18 12/24/2011 0807   BILITOT 0.2* 12/24/2011 0807    CMET is being repeated today, 01/14/2012, results pending.  CA125 on 12/02/2011 was 496.1, down from 838.5 on 11/15/2011, and over 2000 on 10/16/2011.  This is being repeated today, 01/14/2012, with results pending.    ASSESSMENT: 71 year old [place] woman  (1) status post left modified radical mastectomy remotely (we will try to retrieve those records), treated adjuvantly with CMF chemotherapy, with no evidence of recurrence  (2) now status post TAH BSO, omentectomy, and suboptimal debulking 10/29/2011 for a high-grade serous adenocarcinoma of the ovary, pT3c NX (Stage IIIC) receiving chemotherapy with q. three-week doses of carboplatin/paclitaxel, with Neulasta on day 2 for granulocyte support.   (3) COPD with ongoing tobacco abuse  (4) diabetes mellitus  (5)  BRCA 1 and BRCA 2 negative with a "genetic variant of uncertain significance".  PLAN:  Adlee will proceed to treatment today as scheduled for her  third of 6 planned doses of carboplatin/paclitaxel. She will return tomorrow for Neulasta, and I will see her next week for assessment of chemotoxicity on June 18.  We spent time today discussing a bowel regimen which will include Colace and/or MiraLAX, especially for the first few days following treatment, for chemotherapy-induced constipation. I reiterated the importance of contacting us if she has not had a good bowel movement in 48 hours.  As noted above, she will continue to be followed by her genetics counselor for her "genetic variant of uncertain significance". The plan is to proceed with some additional testing.   Our plan is to get through of 6 cycles of chemotherapy with a minimum of toxicity. We will continue to follow her CA 125 on a regular basis. Patient is to call with any changes or problems. She does voice understanding and agreement with our plan.   [name] [name]    01/14/2012

## 2012-01-15 ENCOUNTER — Ambulatory Visit: Payer: Medicare Other

## 2012-01-15 VITALS — BP 109/60 | HR 87 | Temp 97.7°F

## 2012-01-15 DIAGNOSIS — C569 Malignant neoplasm of unspecified ovary: Secondary | ICD-10-CM

## 2012-01-15 DIAGNOSIS — Z853 Personal history of malignant neoplasm of breast: Secondary | ICD-10-CM

## 2012-01-15 MED ORDER — PEGFILGRASTIM INJECTION 6 MG/0.6ML
6.0000 mg | Freq: Once | SUBCUTANEOUS | Status: AC
  Administered 2012-01-15: 6 mg via SUBCUTANEOUS
  Filled 2012-01-15: qty 0.6

## 2012-01-21 ENCOUNTER — Ambulatory Visit (HOSPITAL_BASED_OUTPATIENT_CLINIC_OR_DEPARTMENT_OTHER): Payer: Medicare Other | Admitting: Physician Assistant

## 2012-01-21 ENCOUNTER — Encounter: Payer: Self-pay | Admitting: Gynecologic Oncology

## 2012-01-21 ENCOUNTER — Encounter: Payer: Self-pay | Admitting: Physician Assistant

## 2012-01-21 ENCOUNTER — Other Ambulatory Visit (HOSPITAL_BASED_OUTPATIENT_CLINIC_OR_DEPARTMENT_OTHER): Payer: Medicare Other

## 2012-01-21 ENCOUNTER — Telehealth: Payer: Self-pay | Admitting: *Deleted

## 2012-01-21 ENCOUNTER — Ambulatory Visit: Payer: Medicare Other | Attending: Gynecologic Oncology | Admitting: Gynecologic Oncology

## 2012-01-21 VITALS — BP 102/60 | HR 84 | Temp 97.8°F | Resp 16 | Ht 64.0 in | Wt 122.5 lb

## 2012-01-21 VITALS — BP 130/66 | HR 99 | Temp 98.6°F | Ht 64.0 in | Wt 123.3 lb

## 2012-01-21 DIAGNOSIS — E119 Type 2 diabetes mellitus without complications: Secondary | ICD-10-CM

## 2012-01-21 DIAGNOSIS — F17200 Nicotine dependence, unspecified, uncomplicated: Secondary | ICD-10-CM

## 2012-01-21 DIAGNOSIS — Z9079 Acquired absence of other genital organ(s): Secondary | ICD-10-CM | POA: Insufficient documentation

## 2012-01-21 DIAGNOSIS — Z90710 Acquired absence of both cervix and uterus: Secondary | ICD-10-CM | POA: Insufficient documentation

## 2012-01-21 DIAGNOSIS — Z853 Personal history of malignant neoplasm of breast: Secondary | ICD-10-CM | POA: Insufficient documentation

## 2012-01-21 DIAGNOSIS — Z7982 Long term (current) use of aspirin: Secondary | ICD-10-CM | POA: Insufficient documentation

## 2012-01-21 DIAGNOSIS — C569 Malignant neoplasm of unspecified ovary: Secondary | ICD-10-CM

## 2012-01-21 DIAGNOSIS — Z79899 Other long term (current) drug therapy: Secondary | ICD-10-CM | POA: Insufficient documentation

## 2012-01-21 DIAGNOSIS — C786 Secondary malignant neoplasm of retroperitoneum and peritoneum: Secondary | ICD-10-CM | POA: Insufficient documentation

## 2012-01-21 DIAGNOSIS — J449 Chronic obstructive pulmonary disease, unspecified: Secondary | ICD-10-CM

## 2012-01-21 DIAGNOSIS — Z9010 Acquired absence of unspecified breast and nipple: Secondary | ICD-10-CM | POA: Insufficient documentation

## 2012-01-21 LAB — CBC WITH DIFFERENTIAL/PLATELET
Eosinophils Absolute: 0.2 10*3/uL (ref 0.0–0.5)
HCT: 35.5 % (ref 34.8–46.6)
LYMPH%: 8.6 % — ABNORMAL LOW (ref 14.0–49.7)
MONO#: 2.3 10*3/uL — ABNORMAL HIGH (ref 0.1–0.9)
NEUT#: 36 10*3/uL — ABNORMAL HIGH (ref 1.5–6.5)
Platelets: 301 10*3/uL (ref 145–400)
RBC: 4 10*6/uL (ref 3.70–5.45)
WBC: 42.2 10*3/uL — ABNORMAL HIGH (ref 3.9–10.3)
lymph#: 3.6 10*3/uL — ABNORMAL HIGH (ref 0.9–3.3)

## 2012-01-21 NOTE — Progress Notes (Signed)
ID: [name]   DOB: November 19, 1940  MR#: [id number]  CSN#:[id number]  HISTORY OF PRESENT ILLNESS: [name] [name] is a 71 year-old [place] woman I followed remotely for a history of breast cancer. More recently she noted that her belly was getting "bigger". Gradually she has developed increasing low back pain. After a period of several months, as her symptoms increased, she brought this problem to her primary physician's attention and a KUB was obtained on 10/07/2011, which was unremarkable. This was followed by a CT scan 10/15/2011, which showed ascites and omental caking. Paracentesis 10/18/2011 showed ([id number]) malignant cells consistent with metastatic adenocarcinoma, with equivocal staining for estrogen receptor. The pattern of additional stains was most suggestive of a primary gynecologic tumor. CA 125 at this time was 2051.  The patient was referred to gynecologic oncology and on 10/29/2011 underwent exploratory laparotomy under Drs. Cleda Mccreedy and Antionette Char. This consisted of a total abdominal hysterectomy with bilateral salpingo-oophorectomy, omentectomy, and radical tumor debulking. Unfortunately there was significant tumor attached to the diaphragm so the patient was suboptimally debulked. Accordingly a peritoneal catheter was not placed. GYN-ONC recommended 6 cycles of carboplatin/paclitaxel given every 3 weeks, with Neulasta on day 2 for granulocyte support. [name]'s subsequent history is as detailed below.  INTERVAL HISTORY:  Sheretta returns today for assessment of chemotoxicity on day 8 cycle 3 of 6 planned q. three-week doses of carboplatin/paclitaxel being given for her ovarian carcinoma. She receives Neulasta on day 2 for granulocyte support. She tends to have some constipation for the first few days following chemotherapy, but utilize stool softeners with cycle 3, and that problem has resolved.  Interval history is really quite unremarkable, with the exception of the  [facility] meeting with Dr. Nelly Rout earlier today, who she tells me was very pleased with her results thus far.  REVIEW OF SYSTEMS:  [name] has had no fevers, chills, or night sweats. She's had no nausea or emesis. No oral ulcerations or sensitivity. She also denies any chest pain or increased shortness of breath. No abnormal headaches or dizziness. She has some bony aches and pains following the Neulasta injection for which she takes Aleve and Claritin affectively. The only time she has noticed any signs of numbness or tingling in the extremities as when her feet and hands get especially cold. Otherwise, no additional signs of peripheral neuropathy. No skin changes or abnormal bleeding.  A detailed review of systems is otherwise noncontributory.   PAST MEDICAL HISTORY: Past Medical History  Diagnosis Date  . History of breast cancer   . COPD (chronic obstructive pulmonary disease)   . GERD (gastroesophageal reflux disease)   . Diabetes mellitus     type II  . Hx of colonic polyps   . Osteoarthritis   . MVP (mitral valve prolapse)   . Dysrhythmia     pt states runs a little fast   . Shortness of breath     with exertion   . Depression   . Anxiety   . Cancer     left breast  . Heart murmur     PAST SURGICAL HISTORY: Past Surgical History  Procedure Date  . Cesarean section 1980, 1982    X 2   . Laparoscopy     work up for infertility  . Tympanoplasty     left X 2  . Left breast fibroadenoma removal 1960's  . Breast cyst aspirations   . Left modified radial mastectomy   . Laparotomy 10/29/2011    Procedure: EXPLORATORY  LAPAROTOMY;  Surgeon: Bernita Buffy. Duard Brady, MD;  Location: [facility];  Service: Gynecology;  Laterality: N/A;  . Abdominal hysterectomy 10/29/2011    Procedure: HYSTERECTOMY ABDOMINAL;  Surgeon: Rejeana Brock A. Duard Brady, MD;  Location: [facility];  Service: Gynecology;  Laterality: N/A;  Total abdominal hysterectomy  . Salpingoophorectomy 10/29/2011    Procedure: SALPINGO  OOPHERECTOMY;  Surgeon: Rejeana Brock A. Duard Brady, MD;  Location: [facility];  Service: Gynecology;  Laterality: Bilateral;    FAMILY HISTORY Family History  Problem Relation Age of Onset  . Cancer Mother     endometrial/ colon  . Cancer Brother     colon  . COPD Other   . Hypertension Other   . Cancer Maternal Uncle     prostate/bladder/lung  . Cancer Paternal Aunt     unknown abdominal cancer  . Cancer Maternal Grandmother     Salivary gland cancer  . Cancer Paternal Aunt     leukemia   the patient's father died at the age of 52. The patient's mother died at the age of 5 she had a history of colon cancer and endometrial cancer. The patient has no sisters. She has one brother, with a history of colon cancer. There is no history of breast cancer in the family. The patient is scheduled for genetic testing later this month.  GYNECOLOGIC HISTORY: She had menarche age 38. First pregnancy to term was at age 63. She is GX T2. She became menopausal at the time of her chemotherapy for breast cancer. This was age 16. 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 Her husband [name] runs a Psychologist, sport and exercise out of their own home. Daughter [name], 22,  currently lives at home with the patient. There are some issues relating to this daughter that the patient discussed briefly. Dauhter. [name], 30, is an ED nurse at [employer]. The patient has no grandchildren. She is not a Advice worker.   ADVANCED DIRECTIVES: not in place  HEALTH MAINTENANCE: History  Substance Use Topics  . Smoking status: Current Everyday Smoker -- 1.0 packs/day for 30 years    Types: Cigarettes  . Smokeless tobacco: Never Used   Comment: quit again 2009; restarted 1 ppd 2011  . Alcohol Use: No     Colonoscopy: 2009  PAP: s/p hysterectomy  Bone density: 2010. "good"  Mammography: 2012 NOV--unremarkable  No Known Allergies  Current Outpatient Prescriptions    Medication Sig Dispense Refill  . acetaminophen (TYLENOL) 325 MG tablet Take 650 mg by mouth every 6 (six) hours as needed. For pain      . albuterol-ipratropium (COMBIVENT) 18-103 MCG/ACT inhaler Inhale 2 puffs into the lungs every 4 (four) hours as needed. For wheezing      . Alum & Mag Hydroxide-Simeth (MAGIC MOUTHWASH W/LIDOCAINE) SOLN Take 5 mLs by mouth 4 (four) times daily as needed.  240 mL  2  . aspirin EC 81 MG tablet Take 81 mg by mouth daily.      Marland Kitchen atenolol (TENORMIN) 50 MG tablet Take 25 mg by mouth 2 (two) times daily.      Marland Kitchen b complex vitamins tablet Take 1 tablet by mouth daily.        . budesonide-formoterol (SYMBICORT) 160-4.5 MCG/ACT inhaler Inhale 2 puffs into the lungs 2 (two) times daily.  1 Inhaler  5  . buPROPion (WELLBUTRIN SR) 150 MG 12 hr tablet Take 150 mg by mouth daily after breakfast.      .  busPIRone (BUSPAR) 15 MG tablet Take 1 tablet (15 mg total) by mouth 2 (two) times daily.  180 tablet  1  . cholecalciferol (VITAMIN D) 1000 UNITS tablet Take 1,000 Units by mouth daily.        Marland Kitchen dexamethasone (DECADRON) 4 MG tablet Take 2 tablets by mouth two times a day starting the day after chemotherapy for 3 days.  30 tablet  1  . diazepam (VALIUM) 5 MG tablet 1-2 tabs PO QHS PRN sleep  30 tablet  0  . diphenhydrAMINE (SOMINEX) 25 MG tablet Take 25 mg by mouth at bedtime as needed. For sleep      . Docusate Sodium (COLACE PO) Take 1 each by mouth. After treatments only      . ferrous sulfate 325 (65 FE) MG tablet Take 325 mg by mouth daily with breakfast.      . glucose blood (ONE TOUCH TEST STRIPS) test strip Use as instructed  100 each  3  . ipratropium-albuterol (DUONEB) 0.5-2.5 (3) MG/3ML SOLN Inhale 3 mLs into the lungs 3 (three) times daily.       Marland Kitchen lidocaine-prilocaine (EMLA) cream Apply topically as needed.  60 g  3  . Linaclotide (LINZESS) 290 MCG CAPS Take 1 each by mouth daily as needed.  30 capsule  11  . loratadine (CLARITIN) 10 MG tablet Take 10 mg by mouth  daily.      Marland Kitchen lovastatin (MEVACOR) 40 MG tablet Take 1 tablet (40 mg total) by mouth at bedtime.  90 tablet  2  . metFORMIN (GLUCOPHAGE) 1000 MG tablet Take 1 tablet (1,000 mg total) by mouth 2 (two) times daily with a meal.  180 tablet  3  . naproxen sodium (ANAPROX) 220 MG tablet Take 220 mg by mouth 2 (two) times daily with a meal.      . omeprazole (PRILOSEC) 40 MG capsule Take 1 capsule (40 mg total) by mouth daily.  90 capsule  3  . ondansetron (ZOFRAN) 8 MG tablet Take 1 tab two times a day starting the day after chemo for 3 days. Then take 1 tab two times a day as needed for nausea or vomiting.  30 tablet  2  . oxyCODONE-acetaminophen (ROXICET) 5-325 MG per tablet Take 1 tablet by mouth every 4 (four) hours as needed for pain.  40 tablet  0  . oxymetazoline (AFRIN) 0.05 % nasal spray Place 2 sprays into the nose 2 (two) times daily as needed. Allergies       . prochlorperazine (COMPAZINE) 10 MG tablet Take 1 tablet (10 mg total) by mouth every 6 (six) hours as needed (Nausea or vomiting).  30 tablet  3  . ranitidine (ZANTAC) 150 MG tablet Take 150 mg by mouth 2 (two) times daily as needed. Heart burn       . repaglinide (PRANDIN) 2 MG tablet Take 1 tablet (2 mg total) by mouth 3 (three) times daily as needed. blood glucose- lowering  90 tablet  11  . sertraline (ZOLOFT) 100 MG tablet Take 1 tablet (100 mg total) by mouth daily before breakfast.  90 tablet  2  . traZODone (DESYREL) 50 MG tablet Take 1 tablet (50 mg total) by mouth at bedtime.  30 tablet  4  . DISCONTD: roflumilast (DALIRESP) 500 MCG TABS tablet Take 500 mcg by mouth daily.          OBJECTIVE: Middle-aged white woman who appears well and in no acute distress. Filed Vitals:   01/21/12 1535  BP: 130/66  Pulse: 99  Temp: 98.6 F (37 C)     Body mass index is 21.16 kg/(m^2).    ECOG FS: 1  Filed Weights   01/21/12 1535  Weight: 123 lb 4.8 oz (55.929 kg)   Physical Exam: HEENT:  Sclerae anicteric, conjunctivae pink.   Oropharynx benign. No candidiasis noted.  Nodes:  No peripheral lymphadenopathy palpated today. Breast Exam:  Deferred  Lungs:  Clear to auscultation bilaterally.  No crackles, rhonchi, or wheezes.   Heart:  Regular rate and rhythm.   Abdomen:  Soft, thin, no distention, nontender to palpation.  Positive bowel sounds.  No organomegaly or masses palpated.   Musculoskeletal:  No focal spinal tenderness to palpation. Port is intact in the right upper chest wall with no erythema, edema, or evidence of infection.  Extremities:  Benign.  No peripheral edema or cyanosis.   Neuro:  Nonfocal. Alert and oriented x3.    LAB RESULTS: Lab Results  Component Value Date   WBC 42.2* 01/21/2012   NEUTROABS 36.0* 01/21/2012   HGB 11.2* 01/21/2012   HCT 35.5 01/21/2012   MCV 88.8 01/21/2012   PLT 301 01/21/2012      Chemistry      Component Value Date/Time   NA 139 01/14/2012 1055   K 3.8 01/14/2012 1055   CL 101 01/14/2012 1055   CO2 25 01/14/2012 1055   BUN 14 01/14/2012 1055   CREATININE 0.62 01/14/2012 1055      Component Value Date/Time   CALCIUM 9.8 01/14/2012 1055   ALKPHOS 113 01/14/2012 1055   AST 16 01/14/2012 1055   ALT 16 01/14/2012 1055   BILITOT 0.2* 01/14/2012 1055      CA125 on 01/14/2012 was 118.9, down from 496.1 on 12/02/2011, and over 2000 on 10/16/2011.    STUDIES: No recent studies.   ASSESSMENT: 71 year old [place] woman  (1) status post left modified radical mastectomy remotely (we will try to retrieve those records), treated adjuvantly with CMF chemotherapy, with no evidence of recurrence  (2) now status post TAH BSO, omentectomy, and suboptimal debulking 10/29/2011 for a high-grade serous adenocarcinoma of the ovary, pT3c NX (Stage IIIC) receiving chemotherapy with 6  q. three-week doses of carboplatin/paclitaxel, with Neulasta on day 2 for granulocyte support.   (3) COPD with ongoing tobacco abuse  (4) diabetes mellitus  (5)  BRCA 1 and BRCA 2 negative with a  "genetic variant of uncertain significance".  PLAN:  Livvy continues to tolerate treatment well and will return in 2 weeks on July 2 for her fourth scheduled dose.  Our plan is to get through of 6 cycles of chemotherapy with a minimum of toxicity. We will continue to follow her CA 125 on a regular basis. Patient is to call with any changes or problems. She does voice understanding and agreement with our plan.   Conlin Brahm    01/21/2012

## 2012-01-21 NOTE — Telephone Encounter (Signed)
Made patient appointment for 02-25-2012 added lab and md appointment sent michelle an email to adjust the patient's treatment for 02-25-2012 made patient appointment for 03-03-2012 starting at 11:15am printed out calendar and gave to the patient

## 2012-01-21 NOTE — Progress Notes (Signed)
Consult Note: Gyn-Onc  [name] [name] 71 y.o. female  CC:  Chief Complaint  Patient presents with  . Ovarian Cancer    follow up    HPI: HISTORY OF PRESENT ILLNESS:  [name] [name] is a 71 y.o.  year-old woman a history of breast cancer. [name] [name] 2013  she noted that her belly was getting "bigger". Gradually she has developed increasing low back pain. After a period of several months, as her symptoms increased, she brought this problem to her primary physician's attention and a KUB was obtained on 10/07/2011, which was unremarkable. This was followed by a CT scan 10/15/2011, which showed ascites and omental caking. Paracentesis 10/18/2011 showed  malignant cells consistent with metastatic adenocarcinoma, with equivocal staining for estrogen receptor. The pattern of additional stains was most suggestive of a primary gynecologic tumor. CA 125 at this time was 2051.   On 10/29/2011, she underwent exploratory laparotomy under Drs. Cleda Mccreedy and Antionette Char. This consisted of a total abdominal hysterectomy with bilateral salpingo-oophorectomy, omentectomy, and radical tumor debulking. Unfortunately there was significant tumor attached to the diaphragm so the patient was suboptimally debulked. Accordingly a peritoneal catheter was not placed.   Specific operative findings included extensive carcinomatosis with significant disease involving the diaphragm on the right side. The majority of nodules are less than 1 cm however there were somewhat confluent. The omentum had a 6 cm primary nodule which was completely removed. At the conclusion of the procedure she had a 2 cm nodule involving the cul-de-sac, diffuse small miliary volume disease involving the mesentery of the small and large bowel, and subcentimeter disease involving the right hemidiaphragm however it was fairly confluent. 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 It was high grade. All biopsies omentum were positive. The  serosa of the uterus was positive.    INTERVAL HISTORY:  [name] [name] states that she feels so much better than prior to her diagnosis. She's completed 3 cycles of Taxol carboplatin therapy. Her CA 125 prior to chemotherapy was 2050.7. The most recent value as of 01/14/2012 is 118.9. .   Current Meds:  Outpatient Encounter Prescriptions as of 01/21/2012  Medication Sig Dispense Refill  . Docusate Sodium (COLACE PO) Take 1 each by mouth. After treatments only      . ferrous sulfate 325 (65 FE) MG tablet Take 325 mg by mouth daily with breakfast.      . acetaminophen (TYLENOL) 325 MG tablet Take 650 mg by mouth every 6 (six) hours as needed. For pain      . albuterol-ipratropium (COMBIVENT) 18-103 MCG/ACT inhaler Inhale 2 puffs into the lungs every 4 (four) hours as needed. For wheezing      . Alum & Mag Hydroxide-Simeth (MAGIC MOUTHWASH W/LIDOCAINE) SOLN Take 5 mLs by mouth 4 (four) times daily as needed.  240 mL  2  . aspirin EC 81 MG tablet Take 81 mg by mouth daily.      Marland Kitchen atenolol (TENORMIN) 50 MG tablet Take 25 mg by mouth 2 (two) times daily.      Marland Kitchen b complex vitamins tablet Take 1 tablet by mouth daily.        . budesonide-formoterol (SYMBICORT) 160-4.5 MCG/ACT inhaler Inhale 2 puffs into the lungs 2 (two) times daily.  1 Inhaler  5  . buPROPion (WELLBUTRIN SR) 150 MG 12 hr tablet Take 150 mg by mouth daily after breakfast.      . busPIRone (BUSPAR) 15 MG tablet Take 1 tablet (15 mg total) by  mouth 2 (two) times daily.  180 tablet  1  . cholecalciferol (VITAMIN D) 1000 UNITS tablet Take 1,000 Units by mouth daily.        Marland Kitchen dexamethasone (DECADRON) 4 MG tablet Take 2 tablets by mouth two times a day starting the day after chemotherapy for 3 days.  30 tablet  1  . diazepam (VALIUM) 5 MG tablet 1-2 tabs PO QHS PRN sleep  30 tablet  0  . diphenhydrAMINE (SOMINEX) 25 MG tablet Take 25 mg by mouth at bedtime as needed. For sleep      . glucose blood (ONE TOUCH TEST STRIPS) test strip Use as  instructed  100 each  3  . ipratropium-albuterol (DUONEB) 0.5-2.5 (3) MG/3ML SOLN Inhale 3 mLs into the lungs 3 (three) times daily.       Marland Kitchen lidocaine-prilocaine (EMLA) cream Apply topically as needed.  60 g  3  . Linaclotide (LINZESS) 290 MCG CAPS Take 1 each by mouth daily as needed.  30 capsule  11  . loratadine (CLARITIN) 10 MG tablet Take 10 mg by mouth daily.      Marland Kitchen lovastatin (MEVACOR) 40 MG tablet Take 1 tablet (40 mg total) by mouth at bedtime.  90 tablet  2  . metFORMIN (GLUCOPHAGE) 1000 MG tablet Take 1 tablet (1,000 mg total) by mouth 2 (two) times daily with a meal.  180 tablet  3  . naproxen sodium (ANAPROX) 220 MG tablet Take 220 mg by mouth 2 (two) times daily with a meal.      . omeprazole (PRILOSEC) 40 MG capsule Take 1 capsule (40 mg total) by mouth daily.  90 capsule  3  . ondansetron (ZOFRAN) 8 MG tablet Take 1 tab two times a day starting the day after chemo for 3 days. Then take 1 tab two times a day as needed for nausea or vomiting.  30 tablet  2  . oxyCODONE-acetaminophen (ROXICET) 5-325 MG per tablet Take 1 tablet by mouth every 4 (four) hours as needed for pain.  40 tablet  0  . oxymetazoline (AFRIN) 0.05 % nasal spray Place 2 sprays into the nose 2 (two) times daily as needed. Allergies       . prochlorperazine (COMPAZINE) 10 MG tablet Take 1 tablet (10 mg total) by mouth every 6 (six) hours as needed (Nausea or vomiting).  30 tablet  3  . ranitidine (ZANTAC) 150 MG tablet Take 150 mg by mouth 2 (two) times daily as needed. Heart burn       . repaglinide (PRANDIN) 2 MG tablet Take 1 tablet (2 mg total) by mouth 3 (three) times daily as needed. blood glucose- lowering  90 tablet  11  . sertraline (ZOLOFT) 100 MG tablet Take 1 tablet (100 mg total) by mouth daily before breakfast.  90 tablet  2  . traZODone (DESYREL) 50 MG tablet Take 1 tablet (50 mg total) by mouth at bedtime.  30 tablet  4    Allergy: No Known Allergies  Social Hx:   History   Social History  .  Marital Status: Married    Spouse Name: N/A    Number of Children: N/A  . Years of Education: N/A   Occupational History  . Not on file.   Social History Main Topics  . Smoking status: Current Everyday Smoker -- 1.0 packs/day for 30 years    Types: Cigarettes  . Smokeless tobacco: Never Used   Comment: quit again 2009; restarted 1 ppd 2011  .  Alcohol Use: No  . Drug Use: No  . Sexually Active: Not Currently   Other Topics Concern  . Not on file   Social History Narrative  . No narrative on file    Past Surgical Hx:  Past Surgical History  Procedure Date  . Cesarean section 1980, 1982    X 2   . Laparoscopy     work up for infertility  . Tympanoplasty     left X 2  . Left breast fibroadenoma removal 1960's  . Breast cyst aspirations   . Left modified radial mastectomy   . Laparotomy 10/29/2011    Procedure: EXPLORATORY LAPAROTOMY;  Surgeon: Rejeana Brock A. Duard Brady, MD;  Location: [facility];  Service: Gynecology;  Laterality: N/A;  . Abdominal hysterectomy 10/29/2011    Procedure: HYSTERECTOMY ABDOMINAL;  Surgeon: Rejeana Brock A. Duard Brady, MD;  Location: [facility];  Service: Gynecology;  Laterality: N/A;  Total abdominal hysterectomy  . Salpingoophorectomy 10/29/2011    Procedure: SALPINGO OOPHERECTOMY;  Surgeon: Rejeana Brock A. Duard Brady, MD;  Location: [facility];  Service: Gynecology;  Laterality: Bilateral;    Past Medical Hx:  Past Medical History  Diagnosis Date  . History of breast cancer   . COPD (chronic obstructive pulmonary disease)   . GERD (gastroesophageal reflux disease)   . Diabetes mellitus     type II  . Hx of colonic polyps   . Osteoarthritis   . MVP (mitral valve prolapse)   . Dysrhythmia     pt states runs a little fast   . Shortness of breath     with exertion   . Depression   . Anxiety   . Cancer     left breast  . Heart murmur     Family Hx:  Family History  Problem Relation Age of Onset  . Cancer Mother     endometrial/ colon  . Cancer Brother     colon  . COPD  Other   . Hypertension Other   . Cancer Maternal Uncle     prostate/bladder/lung  . Cancer Paternal Aunt     unknown abdominal cancer  . Cancer Maternal Grandmother     Salivary gland cancer  . Cancer Paternal Aunt     leukemia  REVIEW OF SYSTEMS:   Constitutional: Patient feels well Cardiovascular: Denies chest pain shortness of breath or lower extremity edema Pulmonary: No shortness of breath hemoptysis or cough GI: No evidence of recurrent ascites no nausea vomiting abdominal pain reports normal bowel movements denies diarrhea or hematochezia GYN: No vaginal bleeding or discharge no dysuria or incontinence Musculoskeletal: Reports some myalgia associated with Neulasta denies any neuropathy Skin: Denies any new lesions Otherwise 10 point review of systems  within normal limits   Vitals:  Blood pressure 102/60, pulse 84, temperature 97.8 F (36.6 C), temperature source Oral, resp. rate 16, height 5\' 4"  (1.626 m), weight 122 lb 8 oz (55.566 kg). Physical Exam: Well-nourished well-developed female in no acute distress. Chest: Clear to auscultation bilaterally Cardiac: Regular rate and rhythm no discernible murmurs Abdomen: Soft,. She has a well-healed vertical midline incision. It is nondistended. There is no fluid wave. Midline vaginal incision is intact without any hernia Pelvic: Normal surgeon to Bartholin's urethra and Skene's the vagina is atrophic there is no nodularity within the vagina or cul-de-sac is no evidence of vaginal discharge or bleeding Rectal: Good anal sphincter tone external hemorrhoids appreciated no nodularity rectovaginal septum Back: No CVA tenderness Extremities: No edema.    Assessment/Plan:  This is  a 71 y.o. year-old with suboptimally debulked stage IIIc ovarian carcinoma who is doing well status post surgery. She has completed 3 cycles of chemotherapy with a decrease in her CA 125 to 118.9. She will follow-up with Dr.Magrinat as previously  scheduled.  Laurette Schimke, MD 01/21/2012, 3:14 PM

## 2012-01-21 NOTE — Patient Instructions (Signed)
follow-up with Dr.Magrinat as previously scheduled.

## 2012-02-04 ENCOUNTER — Other Ambulatory Visit (HOSPITAL_BASED_OUTPATIENT_CLINIC_OR_DEPARTMENT_OTHER): Payer: Medicare Other | Admitting: Lab

## 2012-02-04 ENCOUNTER — Ambulatory Visit (HOSPITAL_BASED_OUTPATIENT_CLINIC_OR_DEPARTMENT_OTHER): Payer: Medicare Other

## 2012-02-04 ENCOUNTER — Encounter: Payer: Self-pay | Admitting: Physician Assistant

## 2012-02-04 ENCOUNTER — Other Ambulatory Visit: Payer: Self-pay | Admitting: *Deleted

## 2012-02-04 ENCOUNTER — Ambulatory Visit (HOSPITAL_BASED_OUTPATIENT_CLINIC_OR_DEPARTMENT_OTHER): Payer: Medicare Other | Admitting: Physician Assistant

## 2012-02-04 ENCOUNTER — Telehealth: Payer: Self-pay | Admitting: *Deleted

## 2012-02-04 VITALS — BP 121/72 | HR 86 | Temp 97.7°F | Ht 64.0 in | Wt 125.5 lb

## 2012-02-04 DIAGNOSIS — C569 Malignant neoplasm of unspecified ovary: Secondary | ICD-10-CM

## 2012-02-04 DIAGNOSIS — Z5111 Encounter for antineoplastic chemotherapy: Secondary | ICD-10-CM

## 2012-02-04 DIAGNOSIS — J449 Chronic obstructive pulmonary disease, unspecified: Secondary | ICD-10-CM

## 2012-02-04 DIAGNOSIS — E119 Type 2 diabetes mellitus without complications: Secondary | ICD-10-CM

## 2012-02-04 DIAGNOSIS — K5900 Constipation, unspecified: Secondary | ICD-10-CM

## 2012-02-04 LAB — COMPREHENSIVE METABOLIC PANEL
Albumin: 3.8 g/dL (ref 3.5–5.2)
BUN: 12 mg/dL (ref 6–23)
CO2: 26 mEq/L (ref 19–32)
Calcium: 10 mg/dL (ref 8.4–10.5)
Chloride: 101 mEq/L (ref 96–112)
Creatinine, Ser: 0.59 mg/dL (ref 0.50–1.10)
Glucose, Bld: 120 mg/dL — ABNORMAL HIGH (ref 70–99)
Potassium: 4.2 mEq/L (ref 3.5–5.3)

## 2012-02-04 LAB — CBC WITH DIFFERENTIAL/PLATELET
BASO%: 0.4 % (ref 0.0–2.0)
Basophils Absolute: 0 10*3/uL (ref 0.0–0.1)
Eosinophils Absolute: 0.1 10*3/uL (ref 0.0–0.5)
HCT: 33.3 % — ABNORMAL LOW (ref 34.8–46.6)
HGB: 11.1 g/dL — ABNORMAL LOW (ref 11.6–15.9)
LYMPH%: 19.5 % (ref 14.0–49.7)
MCHC: 33.3 g/dL (ref 31.5–36.0)
MONO#: 0.9 10*3/uL (ref 0.1–0.9)
NEUT%: 69.3 % (ref 38.4–76.8)
Platelets: 265 10*3/uL (ref 145–400)
WBC: 9.2 10*3/uL (ref 3.9–10.3)

## 2012-02-04 MED ORDER — SODIUM CHLORIDE 0.9 % IJ SOLN
10.0000 mL | INTRAMUSCULAR | Status: DC | PRN
  Administered 2012-02-04: 10 mL
  Filled 2012-02-04: qty 10

## 2012-02-04 MED ORDER — FAMOTIDINE IN NACL 20-0.9 MG/50ML-% IV SOLN
20.0000 mg | Freq: Once | INTRAVENOUS | Status: AC
  Administered 2012-02-04: 20 mg via INTRAVENOUS

## 2012-02-04 MED ORDER — SODIUM CHLORIDE 0.9 % IV SOLN
Freq: Once | INTRAVENOUS | Status: AC
  Administered 2012-02-04: 10:00:00 via INTRAVENOUS

## 2012-02-04 MED ORDER — DIPHENHYDRAMINE HCL 50 MG/ML IJ SOLN
25.0000 mg | Freq: Once | INTRAMUSCULAR | Status: AC
Start: 2012-02-04 — End: 2012-02-04
  Administered 2012-02-04: 25 mg via INTRAVENOUS

## 2012-02-04 MED ORDER — SODIUM CHLORIDE 0.9 % IV SOLN
500.0000 mg | Freq: Once | INTRAVENOUS | Status: AC
  Administered 2012-02-04: 500 mg via INTRAVENOUS
  Filled 2012-02-04: qty 50

## 2012-02-04 MED ORDER — ONDANSETRON 16 MG/50ML IVPB (CHCC)
16.0000 mg | Freq: Once | INTRAVENOUS | Status: AC
  Administered 2012-02-04: 16 mg via INTRAVENOUS

## 2012-02-04 MED ORDER — DEXAMETHASONE SODIUM PHOSPHATE 4 MG/ML IJ SOLN
20.0000 mg | Freq: Once | INTRAMUSCULAR | Status: AC
Start: 2012-02-04 — End: 2012-02-04
  Administered 2012-02-04: 20 mg via INTRAVENOUS

## 2012-02-04 MED ORDER — DIAZEPAM 5 MG PO TABS: ORAL_TABLET | ORAL | Status: DC

## 2012-02-04 MED ORDER — PACLITAXEL CHEMO INJECTION 300 MG/50ML
175.0000 mg/m2 | Freq: Once | INTRAVENOUS | Status: AC
  Administered 2012-02-04: 276 mg via INTRAVENOUS
  Filled 2012-02-04: qty 46

## 2012-02-04 MED ORDER — HEPARIN SOD (PORK) LOCK FLUSH 100 UNIT/ML IV SOLN
500.0000 [IU] | Freq: Once | INTRAVENOUS | Status: AC | PRN
  Administered 2012-02-04: 500 [IU]
  Filled 2012-02-04: qty 5

## 2012-02-04 NOTE — Telephone Encounter (Signed)
Sent michelle email to adjust the treatment time on 03-17-2012 per midlevel patient can come in at 10:15am starting at 9:45am stated to the patient that I will bring the calendar in the treatment room

## 2012-02-04 NOTE — Patient Instructions (Addendum)
[facility] Discharge Instructions for Patients Receiving Chemotherapy  Today you received the following chemotherapy agents ; Taxol and Carboplatin.  To help prevent nausea and vomiting after your treatment, we encourage you to take your nausea medication;  Zofran (ondansetron) and Compazine (prochloraperzine) as directed.  May start taking this evening as needed.    If you develop nausea and vomiting that is not controlled by your nausea medication, call the clinic. If it is after clinic hours your family physician or the after hours number for the clinic or go to the Emergency Department.   BELOW ARE SYMPTOMS THAT SHOULD BE REPORTED IMMEDIATELY:  *FEVER GREATER THAN 100.5 F  *CHILLS WITH OR WITHOUT FEVER  NAUSEA AND VOMITING THAT IS NOT CONTROLLED WITH YOUR NAUSEA MEDICATION  *UNUSUAL SHORTNESS OF BREATH  *UNUSUAL BRUISING OR BLEEDING  TENDERNESS IN MOUTH AND THROAT WITH OR WITHOUT PRESENCE OF ULCERS  *URINARY PROBLEMS  *BOWEL PROBLEMS  UNUSUAL RASH Items with * indicate a potential emergency and should be followed up as soon as possible.   Feel free to call the clinic you have any questions or concerns. The clinic phone number is [phone].   I have been informed and understand all the instructions given to me. I know to contact the clinic, my physician, or go to the Emergency Department if any problems should occur. I do not have any questions at this time, but understand that I may call the clinic during office hours   should I have any questions or need assistance in obtaining follow up care.    __________________________________________  _____________  __________ Signature of Patient or Authorized Representative            Date                   Time    __________________________________________ Nurse's Signature

## 2012-02-04 NOTE — Telephone Encounter (Signed)
CALLED TO PHARMACY

## 2012-02-04 NOTE — Progress Notes (Signed)
ID: [name] [name]   DOB: 04-27-1941  MR#: [id number]  CSN#:[id number]  HISTORY OF PRESENT ILLNESS: [name] [name] is a 71 year-old [place] woman I followed remotely for a history of breast cancer. More recently she noted that her belly was getting "bigger". Gradually she has developed increasing low back pain. After a period of several months, as her symptoms increased, she brought this problem to her primary physician's attention and a KUB was obtained on 10/07/2011, which was unremarkable. This was followed by a CT scan 10/15/2011, which showed ascites and omental caking. Paracentesis 10/18/2011 showed ([id number]) malignant cells consistent with metastatic adenocarcinoma, with equivocal staining for estrogen receptor. The pattern of additional stains was most suggestive of a primary gynecologic tumor. CA 125 at this time was 2051.  The patient was referred to gynecologic oncology and on 10/29/2011 underwent exploratory laparotomy under Drs. [name] [name] and [name] [name]. This consisted of a total abdominal hysterectomy with bilateral salpingo-oophorectomy, omentectomy, and radical tumor debulking. Unfortunately there was significant tumor attached to the diaphragm so the patient was suboptimally debulked. Accordingly a peritoneal catheter was not placed. GYN-ONC recommended 6 cycles of carboplatin/paclitaxel given every 3 weeks, with Neulasta on day 2 for granulocyte support. [name] [name]'s subsequent history is as detailed below.  INTERVAL HISTORY:  [name] [name] returns today for followup of her ovarian carcinoma, due for her fourth of 6 planned q. three-week doses of carboplatin/paclitaxel. She receives Neulasta on day 2 for granulocyte support.   Interval history is really quite unremarkable.  [name] [name] is planning to leave tomorrow and will spend the July 4 holiday at their place at [facility].    REVIEW OF SYSTEMS:  [name] [name] has had no fevers, chills, or night sweats. She's had no nausea or  emesis.  No change in bowel habits. No abdominal or pelvic pain. No oral ulcerations or sensitivity. She also denies any chest pain or increased shortness of breath. No abnormal headaches or dizziness. The only time she has noticed any signs of numbness or tingling in the extremities as when her feet and hands get especially cold. Otherwise, no additional signs of peripheral neuropathy. No skin changes or abnormal bleeding.  A detailed review of systems is otherwise noncontributory.   PAST MEDICAL HISTORY: Past Medical History  Diagnosis Date  . History of breast cancer   . COPD (chronic obstructive pulmonary disease)   . GERD (gastroesophageal reflux disease)   . Diabetes mellitus     type II  . Hx of colonic polyps   . Osteoarthritis   . MVP (mitral valve prolapse)   . Dysrhythmia     pt states runs a little fast   . Shortness of breath     with exertion   . Depression   . Anxiety   . Cancer     left breast  . Heart murmur     PAST SURGICAL HISTORY: Past Surgical History  Procedure Date  . Cesarean section 1980, 1982    X 2   . Laparoscopy     work up for infertility  . Tympanoplasty     left X 2  . Left breast fibroadenoma removal 1960's  . Breast cyst aspirations   . Left modified radial mastectomy   . Laparotomy 10/29/2011    Procedure: EXPLORATORY LAPAROTOMY;  Surgeon: [name] Brock A. Duard Brady, MD;  Location: [facility];  Service: Gynecology;  Laterality: N/A;  . Abdominal hysterectomy 10/29/2011    Procedure: HYSTERECTOMY ABDOMINAL;  Surgeon: [name] Brock A. Duard Brady, MD;  Location: [facility];  Service: Gynecology;  Laterality: N/A;  Total abdominal hysterectomy  . Salpingoophorectomy 10/29/2011    Procedure: SALPINGO OOPHERECTOMY;  Surgeon: [name] Brock A. Duard Brady, MD;  Location: [facility];  Service: Gynecology;  Laterality: Bilateral;    FAMILY HISTORY Family History  Problem Relation Age of Onset  . Cancer Mother     endometrial/ colon  . Cancer Brother     colon  . COPD Other   .  Hypertension Other   . Cancer Maternal Uncle     prostate/bladder/lung  . Cancer Paternal Aunt     unknown abdominal cancer  . Cancer Maternal Grandmother     Salivary gland cancer  . Cancer Paternal Aunt     leukemia   the patient's father died at the age of 66. The patient's mother died at the age of 64 she had a history of colon cancer and endometrial cancer. The patient has no sisters. She has one brother, with a history of colon cancer. There is no history of breast cancer in the family. The patient is scheduled for genetic testing later this month.  GYNECOLOGIC HISTORY: She had menarche age 56. First pregnancy to term was at age 26. She is GX T2. She became menopausal at the time of her chemotherapy for breast cancer. This was age 89. 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 Her husband [name] [name] runs a [employer], sport and exercise out of their own home. Daughter [name] [name], 2,  currently lives at home with the patient. There are some issues relating to this daughter that the patient discussed briefly. [place]. [name] [name], 30, is an ED nurse at [employer]. The patient has no grandchildren. She is not a Advice worker.   ADVANCED DIRECTIVES: not in place  HEALTH MAINTENANCE: History  Substance Use Topics  . Smoking status: Current Everyday Smoker -- 1.0 packs/day for 30 years    Types: Cigarettes  . Smokeless tobacco: Never Used   Comment: quit again 2009; restarted 1 ppd 2011  . Alcohol Use: No     Colonoscopy: 2009  PAP: s/p hysterectomy  Bone density: 2010. "good"  Mammography: 2012 NOV--unremarkable  No Known Allergies  Current Outpatient Prescriptions  Medication Sig Dispense Refill  . acetaminophen (TYLENOL) 325 MG tablet Take 650 mg by mouth every 6 (six) hours as needed. For pain      . albuterol-ipratropium (COMBIVENT) 18-103 MCG/ACT inhaler Inhale 2 puffs into the lungs every 4 (four) hours as needed. For wheezing       . Alum & Mag Hydroxide-Simeth (MAGIC MOUTHWASH W/LIDOCAINE) SOLN Take 5 mLs by mouth 4 (four) times daily as needed.  240 mL  2  . aspirin EC 81 MG tablet Take 81 mg by mouth daily.      Marland Kitchen atenolol (TENORMIN) 50 MG tablet Take 25 mg by mouth 2 (two) times daily.      Marland Kitchen b complex vitamins tablet Take 1 tablet by mouth daily.        . budesonide-formoterol (SYMBICORT) 160-4.5 MCG/ACT inhaler Inhale 2 puffs into the lungs 2 (two) times daily.  1 Inhaler  5  . buPROPion (WELLBUTRIN SR) 150 MG 12 hr tablet Take 150 mg by mouth daily after breakfast.      . busPIRone (BUSPAR) 15 MG tablet Take 1 tablet (15 mg total) by mouth 2 (two) times daily.  180 tablet  1  . cholecalciferol (VITAMIN D) 1000 UNITS tablet Take 1,000 Units by mouth daily.        Marland Kitchen  dexamethasone (DECADRON) 4 MG tablet Take 2 tablets by mouth two times a day starting the day after chemotherapy for 3 days.  30 tablet  1  . diazepam (VALIUM) 5 MG tablet 1-2 tabs PO QHS PRN sleep  30 tablet  0  . diphenhydrAMINE (SOMINEX) 25 MG tablet Take 25 mg by mouth at bedtime as needed. For sleep      . Docusate Sodium (COLACE PO) Take 1 each by mouth. After treatments only      . ferrous sulfate 325 (65 FE) MG tablet Take 325 mg by mouth daily with breakfast.      . glucose blood (ONE TOUCH TEST STRIPS) test strip Use as instructed  100 each  3  . ipratropium-albuterol (DUONEB) 0.5-2.5 (3) MG/3ML SOLN Inhale 3 mLs into the lungs 3 (three) times daily.       Marland Kitchen lidocaine-prilocaine (EMLA) cream Apply topically as needed.  60 g  3  . Linaclotide (LINZESS) 290 MCG CAPS Take 1 each by mouth daily as needed.  30 capsule  11  . loratadine (CLARITIN) 10 MG tablet Take 10 mg by mouth daily.      Marland Kitchen lovastatin (MEVACOR) 40 MG tablet Take 1 tablet (40 mg total) by mouth at bedtime.  90 tablet  2  . metFORMIN (GLUCOPHAGE) 1000 MG tablet Take 1 tablet (1,000 mg total) by mouth 2 (two) times daily with a meal.  180 tablet  3  . naproxen sodium (ANAPROX) 220  MG tablet Take 220 mg by mouth 2 (two) times daily with a meal.      . omeprazole (PRILOSEC) 40 MG capsule Take 1 capsule (40 mg total) by mouth daily.  90 capsule  3  . ondansetron (ZOFRAN) 8 MG tablet Take 1 tab two times a day starting the day after chemo for 3 days. Then take 1 tab two times a day as needed for nausea or vomiting.  30 tablet  2  . oxyCODONE-acetaminophen (ROXICET) 5-325 MG per tablet Take 1 tablet by mouth every 4 (four) hours as needed for pain.  40 tablet  0  . oxymetazoline (AFRIN) 0.05 % nasal spray Place 2 sprays into the nose 2 (two) times daily as needed. Allergies       . prochlorperazine (COMPAZINE) 10 MG tablet Take 1 tablet (10 mg total) by mouth every 6 (six) hours as needed (Nausea or vomiting).  30 tablet  3  . ranitidine (ZANTAC) 150 MG tablet Take 150 mg by mouth 2 (two) times daily as needed. Heart burn       . repaglinide (PRANDIN) 2 MG tablet Take 1 tablet (2 mg total) by mouth 3 (three) times daily as needed. blood glucose- lowering  90 tablet  11  . sertraline (ZOLOFT) 100 MG tablet Take 1 tablet (100 mg total) by mouth daily before breakfast.  90 tablet  2  . traZODone (DESYREL) 50 MG tablet Take 1 tablet (50 mg total) by mouth at bedtime.  30 tablet  4  . DISCONTD: roflumilast (DALIRESP) 500 MCG TABS tablet Take 500 mcg by mouth daily.          OBJECTIVE: Middle-aged white woman who appears well and in no acute distress. Filed Vitals:   02/04/12 0924  BP: 121/72  Pulse: 86  Temp: 97.7 F (36.5 C)     Body mass index is 21.54 kg/(m^2).    ECOG FS: 1  Filed Weights   02/04/12 0924  Weight: 125 lb 8 oz (56.926 kg)  Physical Exam: HEENT:  Sclerae anicteric, conjunctivae pink.  Oropharynx benign. No candidiasis noted.  Nodes:  No peripheral lymphadenopathy palpated today. Breast Exam:  Deferred  Lungs:  Clear to auscultation bilaterally.  No crackles, rhonchi, or wheezes.   Heart:  Regular rate and rhythm.   Abdomen:  Soft, thin, no  distention, nontender to palpation.  Positive bowel sounds.  No organomegaly or masses palpated.   Musculoskeletal:  No focal spinal tenderness to palpation. Port is intact in the right upper chest wall with no erythema, edema, or evidence of infection.  Extremities:  Benign.  No peripheral edema or cyanosis.   Neuro:  Nonfocal. Alert and oriented x3.    LAB RESULTS: Lab Results  Component Value Date   WBC 9.2 02/04/2012   NEUTROABS 6.3 02/04/2012   HGB 11.1* 02/04/2012   HCT 33.3* 02/04/2012   MCV 87.2 02/04/2012   PLT 265 02/04/2012      Chemistry      Component Value Date/Time   NA 139 01/14/2012 1055   K 3.8 01/14/2012 1055   CL 101 01/14/2012 1055   CO2 25 01/14/2012 1055   BUN 14 01/14/2012 1055   CREATININE 0.62 01/14/2012 1055      Component Value Date/Time   CALCIUM 9.8 01/14/2012 1055   ALKPHOS 113 01/14/2012 1055   AST 16 01/14/2012 1055   ALT 16 01/14/2012 1055   BILITOT 0.2* 01/14/2012 1055      CA125 on 01/14/2012 was 118.9, down from 496.1 on 12/02/2011, and over 2000 on 10/16/2011.    STUDIES: No recent studies.   ASSESSMENT: 71 year old [place] woman  (1) status post left modified radical mastectomy remotely (we will try to retrieve those records), treated adjuvantly with CMF chemotherapy, with no evidence of recurrence  (2) now status post TAH BSO, omentectomy, and suboptimal debulking 10/29/2011 for a high-grade serous adenocarcinoma of the ovary, pT3c NX (Stage IIIC) receiving chemotherapy with 6  q. three-week doses of carboplatin/paclitaxel, with Neulasta on day 2 for granulocyte support.   (3) COPD with ongoing tobacco abuse  (4) diabetes mellitus  (5)  BRCA 1 and BRCA 2 negative with a "genetic variant of uncertain significance".  PLAN:  Sary continues to tolerate treatment well and will proceed to treatment today as scheduled for day 1 cycle 4 of carboplatin/paclitaxel. She will receive Neulasta on day 2, July 3, and we'll see Dr. Darnelle Catalan next  week for assessment of chemotoxicity. Our plan is to get through of 6 cycles of chemotherapy with a minimum of toxicity. We will continue to follow her CA 125 on a regular basis. Patient is to call with any changes or problems. She does voice understanding and agreement with our plan.   [name] [name]    02/04/2012

## 2012-02-05 ENCOUNTER — Ambulatory Visit: Payer: Medicare Other

## 2012-02-05 ENCOUNTER — Ambulatory Visit (HOSPITAL_BASED_OUTPATIENT_CLINIC_OR_DEPARTMENT_OTHER): Payer: Medicare Other

## 2012-02-05 VITALS — BP 102/62 | HR 84 | Temp 97.6°F

## 2012-02-05 DIAGNOSIS — K5900 Constipation, unspecified: Secondary | ICD-10-CM

## 2012-02-05 DIAGNOSIS — C569 Malignant neoplasm of unspecified ovary: Secondary | ICD-10-CM

## 2012-02-05 MED ORDER — PEGFILGRASTIM INJECTION 6 MG/0.6ML
6.0000 mg | Freq: Once | SUBCUTANEOUS | Status: AC
  Administered 2012-02-05: 6 mg via SUBCUTANEOUS
  Filled 2012-02-05: qty 0.6

## 2012-02-11 ENCOUNTER — Ambulatory Visit (HOSPITAL_BASED_OUTPATIENT_CLINIC_OR_DEPARTMENT_OTHER): Payer: Medicare Other | Admitting: Oncology

## 2012-02-11 ENCOUNTER — Other Ambulatory Visit (HOSPITAL_BASED_OUTPATIENT_CLINIC_OR_DEPARTMENT_OTHER): Payer: Medicare Other | Admitting: Lab

## 2012-02-11 VITALS — BP 117/69 | HR 98 | Temp 98.7°F | Ht 64.0 in | Wt 127.0 lb

## 2012-02-11 DIAGNOSIS — Z853 Personal history of malignant neoplasm of breast: Secondary | ICD-10-CM

## 2012-02-11 DIAGNOSIS — C569 Malignant neoplasm of unspecified ovary: Secondary | ICD-10-CM

## 2012-02-11 DIAGNOSIS — E119 Type 2 diabetes mellitus without complications: Secondary | ICD-10-CM

## 2012-02-11 DIAGNOSIS — J449 Chronic obstructive pulmonary disease, unspecified: Secondary | ICD-10-CM

## 2012-02-11 LAB — CBC WITH DIFFERENTIAL/PLATELET
Basophils Absolute: 0.1 10*3/uL (ref 0.0–0.1)
Eosinophils Absolute: 0.2 10*3/uL (ref 0.0–0.5)
HGB: 11.2 g/dL — ABNORMAL LOW (ref 11.6–15.9)
MCV: 90.8 fL (ref 79.5–101.0)
NEUT#: 16.7 10*3/uL — ABNORMAL HIGH (ref 1.5–6.5)
RDW: 19.3 % — ABNORMAL HIGH (ref 11.2–14.5)
lymph#: 3 10*3/uL (ref 0.9–3.3)

## 2012-02-11 LAB — COMPREHENSIVE METABOLIC PANEL
Albumin: 3.6 g/dL (ref 3.5–5.2)
BUN: 17 mg/dL (ref 6–23)
CO2: 25 mEq/L (ref 19–32)
Calcium: 9.5 mg/dL (ref 8.4–10.5)
Chloride: 101 mEq/L (ref 96–112)
Glucose, Bld: 172 mg/dL — ABNORMAL HIGH (ref 70–99)
Potassium: 4.8 mEq/L (ref 3.5–5.3)

## 2012-02-11 NOTE — Progress Notes (Signed)
ID: ZADIE DEEMER   DOB: 02-25-1941  MR#: [id number]  CSN#:[id number]  HISTORY OF PRESENT ILLNESS: [name] [name] is a 71 year-old [place] woman I followed remotely for a history of breast cancer. More recently she noted that her belly was getting "bigger". Gradually she has developed increasing low back pain. After a period of several months, as her symptoms increased, she brought this problem to her primary physician's attention and a KUB was obtained on 10/07/2011, which was unremarkable. This was followed by a CT scan 10/15/2011, which showed ascites and omental caking. Paracentesis 10/18/2011 showed ([id number]) malignant cells consistent with metastatic adenocarcinoma, with equivocal staining for estrogen receptor. The pattern of additional stains was most suggestive of a primary gynecologic tumor. CA 125 at this time was 2051.  The patient was referred to gynecologic oncology and on 10/29/2011 underwent exploratory laparotomy under Drs. Cleda Mccreedy and Antionette Char. This consisted of a total abdominal hysterectomy with bilateral salpingo-oophorectomy, omentectomy, and radical tumor debulking. Unfortunately there was significant tumor attached to the diaphragm so the patient was suboptimally debulked. Accordingly a peritoneal catheter was not placed. GYN-ONC recommended 6 cycles of carboplatin/paclitaxel given every 3 weeks, with Neulasta on day 2 for granulocyte support. [name] [name]'s subsequent history is as detailed below.  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 three-week doses of carboplatin/paclitaxel. Today is day 8 cycle 4.  REVIEW OF SYSTEMS:  She is generally doing remarkably well. She does experience fatigue, and is currently taking naps daily. She sleeps well at night, with the help of some Valium. The worst problem she is having is the Neulasta. It hurts for about 2 days. She takes Aleve for this with some success. She  doesn't have any peripheral neuropathy symptoms except when her hands get very cold than they feel a little bit. She still taking walks almost daily. She feels a little unsteady sometimes but have been no falls and she so he knows were her feet are. She gets constipated with treatment but no sudden deal with that by using Senokot. There has been no diarrhea she has an excellent appetite and her sense of taste is fine. Otherwise a detailed review of systems today is unremarkable.   PAST MEDICAL HISTORY: Past Medical History  Diagnosis Date  . History of breast cancer   . COPD (chronic obstructive pulmonary disease)   . GERD (gastroesophageal reflux disease)   . Diabetes mellitus     type II  . Hx of colonic polyps   . Osteoarthritis   . MVP (mitral valve prolapse)   . Dysrhythmia     pt states runs a little fast   . Shortness of breath     with exertion   . Depression   . Anxiety   . Cancer     left breast  . Heart murmur     PAST SURGICAL HISTORY: Past Surgical History  Procedure Date  . Cesarean section 1980, 1982    X 2   . Laparoscopy     work up for infertility  . Tympanoplasty     left X 2  . Left breast fibroadenoma removal 1960's  . Breast cyst aspirations   . Left modified radial mastectomy   . Laparotomy 10/29/2011    Procedure: EXPLORATORY LAPAROTOMY;  Surgeon: Rejeana Brock A. Duard Brady, MD;  Location: [facility];  Service: Gynecology;  Laterality: N/A;  . Abdominal hysterectomy 10/29/2011    Procedure: HYSTERECTOMY ABDOMINAL;  Surgeon: Rejeana Brock A. Duard Brady,  MD;  Location: [facility];  Service: Gynecology;  Laterality: N/A;  Total abdominal hysterectomy  . Salpingoophorectomy 10/29/2011    Procedure: SALPINGO OOPHERECTOMY;  Surgeon: Rejeana Brock A. Duard Brady, MD;  Location: [facility];  Service: Gynecology;  Laterality: Bilateral;    FAMILY HISTORY Family History  Problem Relation Age of Onset  . Cancer Mother     endometrial/ colon  . Cancer Brother     colon  . COPD Other   . Hypertension  Other   . Cancer Maternal Uncle     prostate/bladder/lung  . Cancer Paternal Aunt     unknown abdominal cancer  . Cancer Maternal Grandmother     Salivary gland cancer  . Cancer Paternal Aunt     leukemia   the patient's father died at the age of 59. The patient's mother died at the age of 59 she had a history of colon cancer and endometrial cancer. The patient has no sisters. She has one brother, with a history of colon cancer. There is no history of breast cancer in the family. The patient is scheduled for genetic testing later this month.  GYNECOLOGIC HISTORY: She had menarche age 28. First pregnancy to term was at age 57. She is GX T2. She became menopausal at the time of her chemotherapy for breast cancer. This was age 30. 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 Her husband [name] [name] runs a Psych[employer], sport and exercise out of their own home. Daughter [name] [name], 32,  currently lives at home with the patient. There are some issues relating to this daughter that the patient discussed briefly. [place]. [name] [name], 30, is an ED nurse at [employer]. The patient has no grandchildren. She is not a Advice worker.   ADVANCED DIRECTIVES: not in place  HEALTH MAINTENANCE: History  Substance Use Topics  . Smoking status: Current Everyday Smoker -- 1.0 packs/day for 30 years    Types: Cigarettes  . Smokeless tobacco: Never Used   Comment: quit again 2009; restarted 1 ppd 2011  . Alcohol Use: No     Colonoscopy: 2009  PAP: s/p hysterectomy  Bone density: 2010. "good"  Mammography: 2012 NOV--unremarkable  No Known Allergies  Current Outpatient Prescriptions  Medication Sig Dispense Refill  . acetaminophen (TYLENOL) 325 MG tablet Take 650 mg by mouth every 6 (six) hours as needed. For pain      . albuterol-ipratropium (COMBIVENT) 18-103 MCG/ACT inhaler Inhale 2 puffs into the lungs every 4 (four) hours as needed. For wheezing      . Alum  & Mag Hydroxide-Simeth (MAGIC MOUTHWASH W/LIDOCAINE) SOLN Take 5 mLs by mouth 4 (four) times daily as needed.  240 mL  2  . aspirin EC 81 MG tablet Take 81 mg by mouth daily.      Marland Kitchen atenolol (TENORMIN) 50 MG tablet Take 25 mg by mouth 2 (two) times daily.      Marland Kitchen b complex vitamins tablet Take 1 tablet by mouth daily.        . budesonide-formoterol (SYMBICORT) 160-4.5 MCG/ACT inhaler Inhale 2 puffs into the lungs 2 (two) times daily.  1 Inhaler  5  . buPROPion (WELLBUTRIN SR) 150 MG 12 hr tablet Take 150 mg by mouth daily after breakfast.      . busPIRone (BUSPAR) 15 MG tablet Take 1 tablet (15 mg total) by mouth 2 (two) times daily.  180 tablet  1  . cholecalciferol (VITAMIN D) 1000 UNITS tablet Take 1,000 Units by  mouth daily.        Marland Kitchen dexamethasone (DECADRON) 4 MG tablet Take 2 tablets by mouth two times a day starting the day after chemotherapy for 3 days.  30 tablet  1  . diazepam (VALIUM) 5 MG tablet 1-2 tabs PO QHS PRN sleep  30 tablet  1  . diphenhydrAMINE (SOMINEX) 25 MG tablet Take 25 mg by mouth at bedtime as needed. For sleep      . Docusate Sodium (COLACE PO) Take 1 each by mouth. After treatments only      . ferrous sulfate 325 (65 FE) MG tablet Take 325 mg by mouth daily with breakfast.      . glucose blood (ONE TOUCH TEST STRIPS) test strip Use as instructed  100 each  3  . ipratropium-albuterol (DUONEB) 0.5-2.5 (3) MG/3ML SOLN Inhale 3 mLs into the lungs 3 (three) times daily.       Marland Kitchen lidocaine-prilocaine (EMLA) cream Apply topically as needed.  60 g  3  . Linaclotide (LINZESS) 290 MCG CAPS Take 1 each by mouth daily as needed.  30 capsule  11  . loratadine (CLARITIN) 10 MG tablet Take 10 mg by mouth daily.      Marland Kitchen lovastatin (MEVACOR) 40 MG tablet Take 1 tablet (40 mg total) by mouth at bedtime.  90 tablet  2  . metFORMIN (GLUCOPHAGE) 1000 MG tablet Take 1 tablet (1,000 mg total) by mouth 2 (two) times daily with a meal.  180 tablet  3  . naproxen sodium (ANAPROX) 220 MG tablet  Take 220 mg by mouth 2 (two) times daily with a meal.      . omeprazole (PRILOSEC) 40 MG capsule Take 1 capsule (40 mg total) by mouth daily.  90 capsule  3  . ondansetron (ZOFRAN) 8 MG tablet Take 1 tab two times a day starting the day after chemo for 3 days. Then take 1 tab two times a day as needed for nausea or vomiting.  30 tablet  2  . oxyCODONE-acetaminophen (ROXICET) 5-325 MG per tablet Take 1 tablet by mouth every 4 (four) hours as needed for pain.  40 tablet  0  . oxymetazoline (AFRIN) 0.05 % nasal spray Place 2 sprays into the nose 2 (two) times daily as needed. Allergies       . prochlorperazine (COMPAZINE) 10 MG tablet Take 1 tablet (10 mg total) by mouth every 6 (six) hours as needed (Nausea or vomiting).  30 tablet  3  . ranitidine (ZANTAC) 150 MG tablet Take 150 mg by mouth 2 (two) times daily as needed. Heart burn       . repaglinide (PRANDIN) 2 MG tablet Take 1 tablet (2 mg total) by mouth 3 (three) times daily as needed. blood glucose- lowering  90 tablet  11  . sertraline (ZOLOFT) 100 MG tablet Take 1 tablet (100 mg total) by mouth daily before breakfast.  90 tablet  2  . traZODone (DESYREL) 50 MG tablet Take 1 tablet (50 mg total) by mouth at bedtime.  30 tablet  4  . DISCONTD: roflumilast (DALIRESP) 500 MCG TABS tablet Take 500 mcg by mouth daily.          OBJECTIVE: Middle-aged white woman who appears slightly tired  Filed Vitals:   02/11/12 1004  BP: 117/69  Pulse: 98  Temp: 98.7 F (37.1 C)     Body mass index is 21.80 kg/(m^2).    ECOG FS: 1  Filed Weights   02/11/12 1004  Weight:  127 lb (57.607 kg)   Sclerae unicteric Oropharynx clear No cervical or supraclavicular adenopathy Lungs no rales or rhonchi Heart regular rate and rhythm Abd soft, nontender, positive bowel sounds, no masses palpated. MSK no focal spinal tenderness, no peripheral edema Neuro: nonfocal Breasts: The right breast is status post mastectomy. The left breast is  unremarkable.    LAB RESULTS: Lab Results  Component Value Date   WBC 21.3* 02/11/2012   NEUTROABS 16.7* 02/11/2012   HGB 11.2* 02/11/2012   HCT 34.8 02/11/2012   MCV 90.8 02/11/2012   PLT 284 02/11/2012      Chemistry      Component Value Date/Time   NA 137 02/04/2012 0907   K 4.2 02/04/2012 0907   CL 101 02/04/2012 0907   CO2 26 02/04/2012 0907   BUN 12 02/04/2012 0907   CREATININE 0.59 02/04/2012 0907      Component Value Date/Time   CALCIUM 10.0 02/04/2012 0907   ALKPHOS 103 02/04/2012 0907   AST 15 02/04/2012 0907   ALT 13 02/04/2012 0907   BILITOT 0.3 02/04/2012 0907     Results for ZENIAH, BRINEY (MRN [id number]) as of 02/11/2012 10:19  Ref. Range 10/16/2011 16:39 11/15/2011 10:40 12/02/2011 15:15 01/14/2012 10:55  CA 125 Latest Range: 0.0-30.2 U/mL 2050.7 (H) 838.5 (H) 496.1 (H) 118.9 (H)       STUDIES: No recent studies.   ASSESSMENT: 71 y.o.  [place]  (1) status post left modified radical mastectomy October 1993 for a T2 N0 invasive ductal breast, estrogen and progesterone receptor positive cancer treated adjuvantly with MFL (methotrexate/fluorouracil/leucovorin) chemotherapy x6, completed April 1994, followed by 5 years of tamoxifen completed April 1999, with no evidence of recurrence  (2) now status post TAH BSO, omentectomy, and suboptimal debulking 10/29/2011 for a high-grade serous adenocarcinoma of the ovary, pT3c NX (Stage IIIC) receiving chemotherapy with 6  q. three-week doses of carboplatin/paclitaxel, with Neulasta on day 2 for granulocyte support.   (3) COPD with ongoing tobacco abuse  (4) diabetes mellitus  (5)  BRCA 1 and BRCA 2 negative with a "genetic variant of uncertain significance"; additional genetic testing for Lynch syndrome pending.  PLAN:  [name] [name] is tolerating treatment well, and she is having a good response in her CA 125. I would like to do at least 2 cycles beyond normalization of the CA 125, and that likely means we will go to 8 rather than 6  cycles. We discussed this at length today and she is willing to give it a good try. Certainly if we get into toxicities we will stop. Whenever we do stop the chemotherapy she will be restaged with a PET scan and CT of the chest abdomen and pelvis to serve for future reference.  I making otherwise no changes in her treatment. She knows to call for any problems that may develop before the next visit.   Khai Torbert C    02/11/2012

## 2012-02-19 ENCOUNTER — Other Ambulatory Visit: Payer: Self-pay | Admitting: *Deleted

## 2012-02-19 MED ORDER — BUSPIRONE HCL 15 MG PO TABS: 15.0000 mg | ORAL_TABLET | Freq: Two times a day (BID) | ORAL | Status: DC

## 2012-02-21 ENCOUNTER — Inpatient Hospital Stay (HOSPITAL_COMMUNITY)
Admission: EM | Admit: 2012-02-21 | Discharge: 2012-02-24 | DRG: 065 | Disposition: A | Discharge status: Home / Self Care | Payer: Medicare Other | Attending: Internal Medicine | Admitting: Internal Medicine

## 2012-02-21 ENCOUNTER — Encounter (HOSPITAL_COMMUNITY): Payer: Self-pay | Admitting: *Deleted

## 2012-02-21 ENCOUNTER — Emergency Department (HOSPITAL_COMMUNITY): Payer: Medicare Other

## 2012-02-21 ENCOUNTER — Inpatient Hospital Stay (HOSPITAL_COMMUNITY): Payer: Medicare Other

## 2012-02-21 DIAGNOSIS — F411 Generalized anxiety disorder: Secondary | ICD-10-CM | POA: Diagnosis present

## 2012-02-21 DIAGNOSIS — J4489 Other specified chronic obstructive pulmonary disease: Secondary | ICD-10-CM

## 2012-02-21 DIAGNOSIS — R29810 Facial weakness: Secondary | ICD-10-CM | POA: Diagnosis present

## 2012-02-21 DIAGNOSIS — R1900 Intra-abdominal and pelvic swelling, mass and lump, unspecified site: Secondary | ICD-10-CM

## 2012-02-21 DIAGNOSIS — K8020 Calculus of gallbladder without cholecystitis without obstruction: Secondary | ICD-10-CM

## 2012-02-21 DIAGNOSIS — R059 Cough, unspecified: Secondary | ICD-10-CM

## 2012-02-21 DIAGNOSIS — K219 Gastro-esophageal reflux disease without esophagitis: Secondary | ICD-10-CM

## 2012-02-21 DIAGNOSIS — Z853 Personal history of malignant neoplasm of breast: Secondary | ICD-10-CM

## 2012-02-21 DIAGNOSIS — R180 Malignant ascites: Secondary | ICD-10-CM

## 2012-02-21 DIAGNOSIS — K668 Other specified disorders of peritoneum: Secondary | ICD-10-CM

## 2012-02-21 DIAGNOSIS — Z860100 Personal history of colon polyps, unspecified: Secondary | ICD-10-CM

## 2012-02-21 DIAGNOSIS — R4701 Aphasia: Secondary | ICD-10-CM | POA: Diagnosis present

## 2012-02-21 DIAGNOSIS — I059 Rheumatic mitral valve disease, unspecified: Secondary | ICD-10-CM

## 2012-02-21 DIAGNOSIS — F329 Major depressive disorder, single episode, unspecified: Secondary | ICD-10-CM | POA: Diagnosis present

## 2012-02-21 DIAGNOSIS — J209 Acute bronchitis, unspecified: Secondary | ICD-10-CM

## 2012-02-21 DIAGNOSIS — R05 Cough: Secondary | ICD-10-CM

## 2012-02-21 DIAGNOSIS — R109 Unspecified abdominal pain: Secondary | ICD-10-CM

## 2012-02-21 DIAGNOSIS — Z7982 Long term (current) use of aspirin: Secondary | ICD-10-CM

## 2012-02-21 DIAGNOSIS — I6350 Cerebral infarction due to unspecified occlusion or stenosis of unspecified cerebral artery: Principal | ICD-10-CM | POA: Diagnosis present

## 2012-02-21 DIAGNOSIS — R140 Abdominal distension (gaseous): Secondary | ICD-10-CM

## 2012-02-21 DIAGNOSIS — J310 Chronic rhinitis: Secondary | ICD-10-CM

## 2012-02-21 DIAGNOSIS — F809 Developmental disorder of speech and language, unspecified: Secondary | ICD-10-CM | POA: Diagnosis present

## 2012-02-21 DIAGNOSIS — F3289 Other specified depressive episodes: Secondary | ICD-10-CM | POA: Diagnosis present

## 2012-02-21 DIAGNOSIS — I639 Cerebral infarction, unspecified: Secondary | ICD-10-CM | POA: Diagnosis present

## 2012-02-21 DIAGNOSIS — R0602 Shortness of breath: Secondary | ICD-10-CM

## 2012-02-21 DIAGNOSIS — Z79899 Other long term (current) drug therapy: Secondary | ICD-10-CM

## 2012-02-21 DIAGNOSIS — R4789 Other speech disturbances: Secondary | ICD-10-CM | POA: Diagnosis present

## 2012-02-21 DIAGNOSIS — R479 Unspecified speech disturbances: Secondary | ICD-10-CM

## 2012-02-21 DIAGNOSIS — M1990 Unspecified osteoarthritis, unspecified site: Secondary | ICD-10-CM

## 2012-02-21 DIAGNOSIS — C569 Malignant neoplasm of unspecified ovary: Secondary | ICD-10-CM | POA: Diagnosis present

## 2012-02-21 DIAGNOSIS — F32A Depression, unspecified: Secondary | ICD-10-CM

## 2012-02-21 DIAGNOSIS — R0989 Other specified symptoms and signs involving the circulatory and respiratory systems: Secondary | ICD-10-CM

## 2012-02-21 DIAGNOSIS — E785 Hyperlipidemia, unspecified: Secondary | ICD-10-CM

## 2012-02-21 DIAGNOSIS — G459 Transient cerebral ischemic attack, unspecified: Secondary | ICD-10-CM

## 2012-02-21 DIAGNOSIS — J449 Chronic obstructive pulmonary disease, unspecified: Secondary | ICD-10-CM

## 2012-02-21 DIAGNOSIS — K5900 Constipation, unspecified: Secondary | ICD-10-CM

## 2012-02-21 DIAGNOSIS — F17200 Nicotine dependence, unspecified, uncomplicated: Secondary | ICD-10-CM

## 2012-02-21 DIAGNOSIS — E119 Type 2 diabetes mellitus without complications: Secondary | ICD-10-CM | POA: Diagnosis present

## 2012-02-21 HISTORY — DX: Malignant neoplasm of unspecified ovary: C56.9

## 2012-02-21 LAB — APTT: aPTT: 32 seconds (ref 24–37)

## 2012-02-21 LAB — COMPREHENSIVE METABOLIC PANEL
ALT: 14 U/L (ref 0–35)
AST: 16 U/L (ref 0–37)
Albumin: 3.8 g/dL (ref 3.5–5.2)
CO2: 23 mEq/L (ref 19–32)
Chloride: 101 mEq/L (ref 96–112)
GFR calc non Af Amer: >90 mL/min (ref >90)
Potassium: 4 mEq/L (ref 3.5–5.1)
Sodium: 137 mEq/L (ref 135–145)
Total Bilirubin: 0.1 mg/dL — ABNORMAL LOW (ref 0.3–1.2)

## 2012-02-21 LAB — PROTIME-INR
INR: 0.94 (ref 0.00–1.49)
Prothrombin Time: 12.8 seconds (ref 11.6–15.2)

## 2012-02-21 LAB — POCT I-STAT, CHEM 8
BUN: 14 mg/dL (ref 6–23)
Creatinine, Ser: 0.7 mg/dL (ref 0.50–1.10)
Glucose, Bld: 101 mg/dL — ABNORMAL HIGH (ref 70–99)
Hemoglobin: 12.2 g/dL (ref 12.0–15.0)
Sodium: 139 mEq/L (ref 135–145)
TCO2: 22 mmol/L (ref 0–100)

## 2012-02-21 LAB — GLUCOSE, CAPILLARY

## 2012-02-21 LAB — DIFFERENTIAL
Basophils Absolute: 0.1 10*3/uL (ref 0.0–0.1)
Eosinophils Relative: 1 % (ref 0–5)
Lymphocytes Relative: 24 % (ref 12–46)
Monocytes Relative: 6 % (ref 3–12)
Neutrophils Relative %: 68 % (ref 43–77)

## 2012-02-21 LAB — URINALYSIS, ROUTINE W REFLEX MICROSCOPIC
Bilirubin Urine: NEGATIVE
Hgb urine dipstick: NEGATIVE
Nitrite: NEGATIVE
Specific Gravity, Urine: 1.005 (ref 1.005–1.030)
Urobilinogen, UA: 0.2 mg/dL (ref 0.0–1.0)
pH: 6 (ref 5.0–8.0)

## 2012-02-21 LAB — CBC
MCV: 89.3 fL (ref 78.0–100.0)
Platelets: 347 10*3/uL (ref 150–400)
RBC: 3.84 MIL/uL — ABNORMAL LOW (ref 3.87–5.11)
RDW: 18 % — ABNORMAL HIGH (ref 11.5–15.5)
WBC: 12.9 10*3/uL — ABNORMAL HIGH (ref 4.0–10.5)

## 2012-02-21 MED ORDER — DIAZEPAM 5 MG PO TABS
ORAL_TABLET | ORAL | Status: AC
  Filled 2012-02-21: qty 2

## 2012-02-21 MED ORDER — PANTOPRAZOLE SODIUM 40 MG PO TBEC
40.0000 mg | DELAYED_RELEASE_TABLET | Freq: Every day | ORAL | Status: DC
  Administered 2012-02-21 – 2012-02-23 (×3): 40 mg via ORAL
  Filled 2012-02-21 (×2): qty 1

## 2012-02-21 MED ORDER — SIMVASTATIN 20 MG PO TABS
20.0000 mg | ORAL_TABLET | Freq: Every day | ORAL | Status: DC
  Administered 2012-02-22: 20 mg via ORAL
  Filled 2012-02-21 (×3): qty 1

## 2012-02-21 MED ORDER — FERROUS SULFATE 325 (65 FE) MG PO TABS
325.0000 mg | ORAL_TABLET | Freq: Every day | ORAL | Status: DC
  Administered 2012-02-22 – 2012-02-24 (×3): 325 mg via ORAL
  Filled 2012-02-21 (×5): qty 1

## 2012-02-21 MED ORDER — DOCUSATE SODIUM 100 MG PO CAPS
100.0000 mg | ORAL_CAPSULE | Freq: Two times a day (BID) | ORAL | Status: DC
  Administered 2012-02-21 – 2012-02-23 (×4): 100 mg via ORAL
  Filled 2012-02-21 (×4): qty 1

## 2012-02-21 MED ORDER — BUSPIRONE HCL 15 MG PO TABS
15.0000 mg | ORAL_TABLET | Freq: Two times a day (BID) | ORAL | Status: DC
  Administered 2012-02-21 – 2012-02-24 (×6): 15 mg via ORAL
  Filled 2012-02-21 (×7): qty 1

## 2012-02-21 MED ORDER — IPRATROPIUM BROMIDE 0.02 % IN SOLN: 0.5000 mg | Freq: Three times a day (TID) | RESPIRATORY_TRACT | Status: DC

## 2012-02-21 MED ORDER — BUDESONIDE-FORMOTEROL FUMARATE 160-4.5 MCG/ACT IN AERO
2.0000 | INHALATION_SPRAY | Freq: Two times a day (BID) | RESPIRATORY_TRACT | Status: DC
  Administered 2012-02-22 – 2012-02-24 (×5): 2 via RESPIRATORY_TRACT
  Filled 2012-02-21: qty 6

## 2012-02-21 MED ORDER — ACETAMINOPHEN 325 MG PO TABS: 650.0000 mg | ORAL_TABLET | ORAL | Status: DC | PRN

## 2012-02-21 MED ORDER — IPRATROPIUM-ALBUTEROL 0.5-2.5 (3) MG/3ML IN SOLN: 3.0000 mL | Freq: Three times a day (TID) | RESPIRATORY_TRACT | Status: DC

## 2012-02-21 MED ORDER — INSULIN ASPART 100 UNIT/ML ~~LOC~~ SOLN
0.0000 [IU] | SUBCUTANEOUS | Status: DC
Start: 2012-02-22 — End: 2012-02-24
  Administered 2012-02-22: 1 [IU] via SUBCUTANEOUS
  Administered 2012-02-23: 2 [IU] via SUBCUTANEOUS
  Administered 2012-02-24: 1 [IU] via SUBCUTANEOUS

## 2012-02-21 MED ORDER — BUPROPION HCL ER (SR) 150 MG PO TB12
150.0000 mg | ORAL_TABLET | Freq: Two times a day (BID) | ORAL | Status: DC
  Administered 2012-02-21 – 2012-02-24 (×6): 150 mg via ORAL
  Filled 2012-02-21 (×7): qty 1

## 2012-02-21 MED ORDER — ATENOLOL 25 MG PO TABS
25.0000 mg | ORAL_TABLET | Freq: Two times a day (BID) | ORAL | Status: DC
  Administered 2012-02-21 – 2012-02-24 (×4): 25 mg via ORAL
  Filled 2012-02-21 (×7): qty 1

## 2012-02-21 MED ORDER — DIAZEPAM 5 MG PO TABS
5.0000 mg | ORAL_TABLET | Freq: Every evening | ORAL | Status: DC | PRN
  Administered 2012-02-21: 10 mg via ORAL
  Filled 2012-02-21 (×2): qty 2

## 2012-02-21 MED ORDER — IPRATROPIUM-ALBUTEROL 18-103 MCG/ACT IN AERO
2.0000 | INHALATION_SPRAY | RESPIRATORY_TRACT | Status: DC | PRN
  Administered 2012-02-22 – 2012-02-24 (×4): 2 via RESPIRATORY_TRACT
  Filled 2012-02-21: qty 14.7

## 2012-02-21 MED ORDER — PROCHLORPERAZINE MALEATE 10 MG PO TABS: 10.0000 mg | ORAL_TABLET | Freq: Four times a day (QID) | ORAL | Status: DC | PRN

## 2012-02-21 MED ORDER — LORATADINE 10 MG PO TABS
10.0000 mg | ORAL_TABLET | Freq: Every day | ORAL | Status: DC
  Administered 2012-02-22 – 2012-02-24 (×3): 10 mg via ORAL
  Filled 2012-02-21 (×3): qty 1

## 2012-02-21 MED ORDER — ALBUTEROL SULFATE (5 MG/ML) 0.5% IN NEBU: 2.5000 mg | INHALATION_SOLUTION | Freq: Three times a day (TID) | RESPIRATORY_TRACT | Status: DC

## 2012-02-21 MED ORDER — ASPIRIN 325 MG PO TABS
325.0000 mg | ORAL_TABLET | Freq: Every day | ORAL | Status: DC
  Administered 2012-02-22: 325 mg via ORAL
  Filled 2012-02-21: qty 1

## 2012-02-21 MED ORDER — SODIUM CHLORIDE 0.9 % IV SOLN: INTRAVENOUS | Status: AC

## 2012-02-21 NOTE — ED Provider Notes (Signed)
History     CSN: [id number]  Arrival date & time 02/21/12  1628   First MD Initiated Contact with Patient 02/21/12 1629      Chief Complaint  Patient presents with  . Transient Ischemic Attack    (Consider location/radiation/quality/duration/timing/severity/associated sxs/prior treatment) Patient is a 71 y.o. female presenting with neurologic complaint. The history is provided by the patient, the EMS personnel and the spouse. History Limited By: the patient's recall of the event.  Neurologic Problem The primary symptoms include focal weakness and speech change. Primary symptoms do not include headaches, syncope, loss of consciousness, altered mental status, seizures, dizziness, visual change, paresthesias, fever, nausea or vomiting. Episode onset: ~1515. The symptoms are resolved. The neurological symptoms are focal.  There is weakness in these regions/motions: facial muscles. There is impairment of the following actions: articulating words.   Features of the speech change include inability to articulate and inability to speak fluently.  Additional symptoms do not include neck stiffness, weakness, pain or vertigo. Medical issues also include cancer and diabetes. Medical issues do not include seizures, cerebral vascular accident or hypertension.    Past Medical History  Diagnosis Date  . History of breast cancer   . COPD (chronic obstructive pulmonary disease)   . GERD (gastroesophageal reflux disease)   . Diabetes mellitus     type II  . Hx of colonic polyps   . Osteoarthritis   . MVP (mitral valve prolapse)   . Dysrhythmia     pt states runs a little fast   . Shortness of breath     with exertion   . Depression   . Anxiety   . Cancer     left breast  . Heart murmur     Past Surgical History  Procedure Date  . Cesarean section 1980, 1982    X 2   . Laparoscopy     work up for infertility  . Tympanoplasty     left X 2  . Left breast fibroadenoma removal 1960's  .  Breast cyst aspirations   . Left modified radial mastectomy   . Laparotomy 10/29/2011    Procedure: EXPLORATORY LAPAROTOMY;  Surgeon: Rejeana Brock A. Duard Brady, MD;  Location: [facility];  Service: Gynecology;  Laterality: N/A;  . Abdominal hysterectomy 10/29/2011    Procedure: HYSTERECTOMY ABDOMINAL;  Surgeon: Rejeana Brock A. Duard Brady, MD;  Location: [facility];  Service: Gynecology;  Laterality: N/A;  Total abdominal hysterectomy  . Salpingoophorectomy 10/29/2011    Procedure: SALPINGO OOPHERECTOMY;  Surgeon: Rejeana Brock A. Duard Brady, MD;  Location: [facility];  Service: Gynecology;  Laterality: Bilateral;    Family History  Problem Relation Age of Onset  . Cancer Mother     endometrial/ colon  . Cancer Brother     colon  . COPD Other   . Hypertension Other   . Cancer Maternal Uncle     prostate/bladder/lung  . Cancer Paternal Aunt     unknown abdominal cancer  . Cancer Maternal Grandmother     Salivary gland cancer  . Cancer Paternal Aunt     leukemia    History  Substance Use Topics  . Smoking status: Current Everyday Smoker -- 1.0 packs/day for 30 years    Types: Cigarettes  . Smokeless tobacco: Never Used   Comment: quit again 2009; restarted 1 ppd 2011  . Alcohol Use: No    OB History    Grav Para Term Preterm Abortions TAB SAB Ect Mult Living  Review of Systems  Constitutional: Negative for fever and chills.  HENT: Positive for trouble swallowing. Negative for neck pain and neck stiffness.   Respiratory: Negative for cough and shortness of breath.   Cardiovascular: Negative for chest pain, palpitations and syncope.  Gastrointestinal: Negative for nausea, vomiting and abdominal pain.  Genitourinary: Negative for dysuria.  Musculoskeletal: Negative for back pain.  Skin: Negative for color change and rash.  Neurological: Positive for speech change, focal weakness, facial asymmetry and speech difficulty. Negative for dizziness, vertigo, seizures, loss of consciousness, weakness,  light-headedness, headaches and paresthesias.       Difficulty swallowing  Psychiatric/Behavioral: Negative for altered mental status.  All other systems reviewed and are negative.    Allergies  Review of patient's allergies indicates no known allergies.  Home Medications   Current Outpatient Rx  Name Route Sig Dispense Refill  . ACETAMINOPHEN 325 MG PO TABS Oral Take 650 mg by mouth every 6 (six) hours as needed. For pain    . IPRATROPIUM-ALBUTEROL 18-103 MCG/ACT IN AERO Inhalation Inhale 2 puffs into the lungs every 4 (four) hours as needed. For wheezing    . MAGIC MOUTHWASH W/LIDOCAINE Oral Take 5 mLs by mouth 4 (four) times daily as needed. 240 mL 2  . ASPIRIN EC 81 MG PO TBEC Oral Take 81 mg by mouth daily.    . ATENOLOL 50 MG PO TABS Oral Take 25 mg by mouth 2 (two) times daily.    . B COMPLEX PO TABS Oral Take 1 tablet by mouth daily.      . BUDESONIDE-FORMOTEROL FUMARATE 160-4.5 MCG/ACT IN AERO Inhalation Inhale 2 puffs into the lungs 2 (two) times daily. 1 Inhaler 5  . BUPROPION HCL ER (SR) 150 MG PO TB12 Oral Take 150 mg by mouth daily after breakfast.    . BUSPIRONE HCL 15 MG PO TABS Oral Take 1 tablet (15 mg total) by mouth 2 (two) times daily. 180 tablet 1  . VITAMIN D 1000 UNITS PO TABS Oral Take 1,000 Units by mouth daily.      Marland Kitchen DEXAMETHASONE 4 MG PO TABS  Take 2 tablets by mouth two times a day starting the day after chemotherapy for 3 days. 30 tablet 1  . DIAZEPAM 5 MG PO TABS  1-2 tabs PO QHS PRN sleep 30 tablet 1  . DIPHENHYDRAMINE HCL (SLEEP) 25 MG PO TABS Oral Take 25 mg by mouth at bedtime as needed. For sleep    . COLACE PO Oral Take 1 each by mouth. After treatments only    . FERROUS SULFATE 325 (65 FE) MG PO TABS Oral Take 325 mg by mouth daily with breakfast.    . IPRATROPIUM-ALBUTEROL 0.5-2.5 (3) MG/3ML IN SOLN Inhalation Inhale 3 mLs into the lungs 3 (three) times daily.     Marland Kitchen LIDOCAINE-PRILOCAINE 2.5-2.5 % EX CREA Topical Apply topically as needed. 60 g 3    . LINACLOTIDE 290 MCG PO CAPS Oral Take 1 each by mouth daily as needed. 30 capsule 11  . LORATADINE 10 MG PO TABS Oral Take 10 mg by mouth daily.    Marland Kitchen LOVASTATIN 40 MG PO TABS Oral Take 1 tablet (40 mg total) by mouth at bedtime. 90 tablet 2  . METFORMIN HCL 1000 MG PO TABS Oral Take 1 tablet (1,000 mg total) by mouth 2 (two) times daily with a meal. 180 tablet 3  . NAPROXEN SODIUM 220 MG PO TABS Oral Take 220 mg by mouth 2 (two) times daily with a  meal.    . OMEPRAZOLE 40 MG PO CPDR Oral Take 1 capsule (40 mg total) by mouth daily. 90 capsule 3  . ONDANSETRON HCL 8 MG PO TABS  Take 1 tab two times a day starting the day after chemo for 3 days. Then take 1 tab two times a day as needed for nausea or vomiting. 30 tablet 2  . OXYCODONE-ACETAMINOPHEN 5-325 MG PO TABS Oral Take 1 tablet by mouth every 4 (four) hours as needed for pain. 40 tablet 0  . OXYMETAZOLINE HCL 0.05 % NA SOLN Nasal Place 2 sprays into the nose 2 (two) times daily as needed. Allergies     . PROCHLORPERAZINE MALEATE 10 MG PO TABS Oral Take 1 tablet (10 mg total) by mouth every 6 (six) hours as needed (Nausea or vomiting). 30 tablet 3  . RANITIDINE HCL 150 MG PO TABS Oral Take 150 mg by mouth 2 (two) times daily as needed. Heart burn     . REPAGLINIDE 2 MG PO TABS Oral Take 1 tablet (2 mg total) by mouth 3 (three) times daily as needed. blood glucose- lowering 90 tablet 11  . SERTRALINE HCL 100 MG PO TABS Oral Take 1 tablet (100 mg total) by mouth daily before breakfast. 90 tablet 2  . TRAZODONE HCL 50 MG PO TABS Oral Take 1 tablet (50 mg total) by mouth at bedtime. 30 tablet 4    There were no vitals taken for this visit.  Physical Exam  Nursing note and vitals reviewed. Constitutional: She is oriented to person, place, and time. She appears well-developed and well-nourished.  HENT:  Head: Normocephalic and atraumatic.       Nearly bald from chemo  Eyes: Pupils are equal, round, and reactive to light.  Neck: Carotid  bruit is not present.  Cardiovascular: Normal rate, regular rhythm, normal heart sounds and intact distal pulses.   Pulmonary/Chest: Effort normal and breath sounds normal. No respiratory distress.  Abdominal: Soft. Bowel sounds are normal. She exhibits no distension. There is no tenderness.  Neurological: She is alert and oriented to person, place, and time. She has normal strength. No cranial nerve deficit or sensory deficit. Coordination normal. GCS eye subscore is 4. GCS verbal subscore is 5. GCS motor subscore is 6.  Reflex Scores:      Bicep reflexes are 2+ on the right side and 2+ on the left side.      Patellar reflexes are 2+ on the right side and 2+ on the left side. Skin: Skin is warm and dry.  Psychiatric: She has a normal mood and affect.    ED Course  Procedures (including critical care time)  Labs Reviewed  CBC - Abnormal; Notable for the following:    WBC 12.9 (*)     RBC 3.84 (*)     Hemoglobin 11.5 (*)     HCT 34.3 (*)     RDW 18.0 (*)     All other components within normal limits  DIFFERENTIAL - Abnormal; Notable for the following:    Neutro Abs 8.8 (*)     All other components within normal limits  COMPREHENSIVE METABOLIC PANEL - Abnormal; Notable for the following:    Glucose, Bld 104 (*)     Alkaline Phosphatase 120 (*)     Total Bilirubin 0.1 (*)     All other components within normal limits  POCT I-STAT, CHEM 8 - Abnormal; Notable for the following:    Glucose, Bld 101 (*)  All other components within normal limits  GLUCOSE, CAPILLARY - Abnormal; Notable for the following:    Glucose-Capillary 108 (*)     All other components within normal limits  URINALYSIS, ROUTINE W REFLEX MICROSCOPIC - Abnormal; Notable for the following:    Leukocytes, UA TRACE (*)     All other components within normal limits  PROTIME-INR  APTT  TROPONIN I  URINE MICROSCOPIC-ADD ON  HEMOGLOBIN A1C  GLUCOSE, POCT (MANUAL RESULT ENTRY)  CARDIAC PANEL(CRET  KIN+CKTOT+MB+TROPI)  LIPID PANEL  GLUCOSE, POCT (MANUAL RESULT ENTRY)  GLUCOSE, POCT (MANUAL RESULT ENTRY)  GLUCOSE, POCT (MANUAL RESULT ENTRY)   Ct Head Wo Contrast  02/21/2012  *RADIOLOGY REPORT*  Clinical Data: Right sided facial injury.  History of breast cancer.  CT HEAD WITHOUT CONTRAST  Technique:  Contiguous axial images were obtained from the base of the skull through the vertex without contrast.  Comparison: No priors.  Findings: There is mild cerebral and cerebellar atrophy.  There are patchy and confluent areas of decreased attenuation throughout the deep and periventricular white matter of the cerebral hemispheres bilaterally, compatible with chronic microvascular ischemic changes.  These findings are somewhat asymmetric, however, involving the periventricular white matter on the left to a greater extent than that on the right.  No evidence of acute intracranial hemorrhage, definite mass, mass effect, hydrocephalus or abnormal intra or extra-axial fluid collections.  No acute displaced skull fractures are identified.  Visualized paranasal sinuses and mastoids are well pneumatized.  IMPRESSION: 1.  Mild cerebral atrophy with evidence of chronic microvascular ischemic changes in the deep and periventricular white matter of the cerebral hemispheres bilaterally.  These findings are somewhat asymmetric involving the left cerebral white matter to a greater extent than the right.  Some of these regions are very ill-defined, and strictly speaking, areas of acute/subacute cerebral ischemia would be difficult to exclude on the basis of this examination (no priors are available for comparison).  If there is clinical concern for acute ischemia, further evaluation with brain MRI may be warranted.  Original Report Authenticated By: Florencia Reasons, M.D.   Mr Maxine Glenn Head Wo Contrast  02/21/2012  *RADIOLOGY REPORT*  Clinical Data:  71 year old female Code stroke.  Right facial droop and aphasia.  History of  breast cancer.  Comparison: Head CT without contrast 02/21/2012.  MRI HEAD WITHOUT CONTRAST  Technique: Multiplanar, multiecho pulse sequences of the brain and surrounding structures were obtained according to standard protocol without intravenous contrast.  Findings: Partially empty sella.  Mildly heterogeneous diffusion imaging, but No restricted diffusion to suggest acute infarction. Major intracranial vascular flow voids are preserved.  No ventriculomegaly. No midline shift, mass effect, or evidence of mass lesion.  No acute intracranial hemorrhage identified. Negative cervicomedullary junction.  Negative for age cervical spine (degenerative ligamentous hypertrophy about the odontoid. Visualized bone marrow signal is within normal limits.  Moderate for age patchy and confluent cerebral white matter T2 and FLAIR hyperintensity.  Mild to moderate pontine white matter T2 hyperintensity.  Deep gray matter nuclei and cerebellum are within normal limits.  Left sphenoid sinus mucosal thickening.  Other Visualized paranasal sinuses and mastoids are clear.  Visualized orbit soft tissues are within normal limits.  Grossly negative visualized internal auditory structures.  There is a round T2 hyperintense 16 mm right parotid lesion partially visible.  Otherwise negative visualized scalp soft tissues.  IMPRESSION: 1. No acute intracranial abnormality.  See MRA findings below. 2.  Early metastatic disease to the brain cannot be excluded in  the absence of intravenous contrast. 3.  Moderate nonspecific white matter signal changes, most commonly due to chronic small vessel disease. 4.  16 mm right parotid lesion partially visible, with signal characteristics compatible with benign etiology.  MRA HEAD WITHOUT CONTRAST  Technique: Angiographic images of the Circle of Willis were obtained using MRA technique without  intravenous contrast.  Findings: Antegrade flow in the posterior circulation with dominant distal left vertebral  artery.  Normal PICA and AICA vessels. Normal basilar artery.  Normal superior cerebellar artery and PCA origins.  Normal posterior right communicating artery, the left is diminutive or absent.  Bilateral PCA branches are within normal limits.  Antegrade flow in both ICA siphons.  Mild ICA irregularity without stenosis.  Ophthalmic artery and right posterior communicating artery origins are within normal limits.  Normal carotid termini, MCA and ACA origins.  Small anterior communicating artery.  Visualized ACA branches are within normal limits.  The right MCA M1 segment is mildly irregular but without stenosis.  Visualized right MCA branches are within normal limits.  There is early left MCA M1 branching.  The superior branch abruptly terminates on series 7 image 74 (series 704 image 7).  Subsequently there is an occluded left M2 branch suspected (series 7 image 83). However, the inferior M1 branch remains patent along with multiple other M2 branches.  IMPRESSION: 1.  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 This finding discussed by telephone with Dr. Merlene Laughter on 02/21/2012 at 2112 hours. 2.  Other left MCA branches are patent.  No more proximal anterior circulation lesion. 3.  Negative posterior circulation.  Original Report Authenticated By: Harley Hallmark, M.D.   Mr Brain Wo Contrast  02/21/2012  *RADIOLOGY REPORT*  Clinical Data:  71 year old female Code stroke.  Right facial droop and aphasia.  History of breast cancer.  Comparison: Head CT without contrast 02/21/2012.  MRI HEAD WITHOUT CONTRAST  Technique: Multiplanar, multiecho pulse sequences of the brain and surrounding structures were obtained according to standard protocol without intravenous contrast.  Findings: Partially empty sella.  Mildly heterogeneous diffusion imaging, but No restricted diffusion to suggest acute infarction. Major intracranial vascular flow voids are preserved.  No  ventriculomegaly. No midline shift, mass effect, or evidence of mass lesion.  No acute intracranial hemorrhage identified. Negative cervicomedullary junction.  Negative for age cervical spine (degenerative ligamentous hypertrophy about the odontoid. Visualized bone marrow signal is within normal limits.  Moderate for age patchy and confluent cerebral white matter T2 and FLAIR hyperintensity.  Mild to moderate pontine white matter T2 hyperintensity.  Deep gray matter nuclei and cerebellum are within normal limits.  Left sphenoid sinus mucosal thickening.  Other Visualized paranasal sinuses and mastoids are clear.  Visualized orbit soft tissues are within normal limits.  Grossly negative visualized internal auditory structures.  There is a round T2 hyperintense 16 mm right parotid lesion partially visible.  Otherwise negative visualized scalp soft tissues.  IMPRESSION: 1. No acute intracranial abnormality.  See MRA findings below. 2.  Early metastatic disease to the brain cannot be excluded in the absence of intravenous contrast. 3.  Moderate nonspecific white matter signal changes, most commonly due to chronic small vessel disease. 4.  16 mm right parotid lesion partially visible, with signal characteristics compatible with benign etiology.  MRA HEAD WITHOUT CONTRAST  Technique: Angiographic images of the Circle of Willis were obtained using MRA technique without  intravenous contrast.  Findings: Antegrade flow in the posterior  circulation with dominant distal left vertebral artery.  Normal PICA and AICA vessels. Normal basilar artery.  Normal superior cerebellar artery and PCA origins.  Normal posterior right communicating artery, the left is diminutive or absent.  Bilateral PCA branches are within normal limits.  Antegrade flow in both ICA siphons.  Mild ICA irregularity without stenosis.  Ophthalmic artery and right posterior communicating artery origins are within normal limits.  Normal carotid termini, MCA and  ACA origins.  Small anterior communicating artery.  Visualized ACA branches are within normal limits.  The right MCA M1 segment is mildly irregular but without stenosis.  Visualized right MCA branches are within normal limits.  There is early left MCA M1 branching.  The superior branch abruptly terminates on series 7 image 74 (series 704 image 7).  Subsequently there is an occluded left M2 branch suspected (series 7 image 83). However, the inferior M1 branch remains patent along with multiple other M2 branches.  IMPRESSION: 1.  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 This finding discussed by telephone with Dr. Merlene Laughter on 02/21/2012 at 2112 hours. 2.  Other left MCA branches are patent.  No more proximal anterior circulation lesion. 3.  Negative posterior circulation.  Original Report Authenticated By: Harley Hallmark, M.D.     Date: 02/21/2012  Rate: 85  Rhythm: normal sinus rhythm  QRS Axis: normal  Intervals: normal  ST/T Wave abnormalities: normal  Conduction Disutrbances:none  Narrative Interpretation:   Old EKG Reviewed: unchanged (10/24/11)   1. Unspecified transient cerebral ischemia   2. Speech and language disorder   3. Ovarian cancer   4. Type II or unspecified type diabetes mellitus without mention of complication, not stated as uncontrolled       MDM  This is a 71 year old female who presents with concerns for strokelike symptoms. History is limited by the fact that the patient does not remember the event, and her husband saw the facial droop is not yet here. According to EMS, the patient was on the phone with her daughter at about 2, and the daughter noticed that she was having difficulties getting her words out, and was complaining of trouble swallowing at that point in time. The patient's husband came home from work at that point in time, he noticed right-sided facial droop to EMS was called. Per EMS they did notice  mild residual facial droop when he first arrived they did not notice any speech difficulties. As above, patient does not remember any other details surrounding this event, with this event happened, but at this point in time has no complaints. She is currently being treated for stage IIIc ovarian cancer with chemotherapy, after a suboptimal debulking surgery due to adherence to the diaphragm, but denies any known metastases outside the abdomen. She has not had any recent headaches, has no history of strokes, TIAs, or seizures, and states that she has not recently been sick. Her exam at this time does not show any neuro deficits. We'll check lab work and CT head to evaluate for possible etiologies of the patient's symptoms. When the family arrives we'll get a further detailed history of exactly what occurred during this event. Feel that due to the patient's neurologic symptoms, especially in the setting of her having cancer she is at increased risk for TIA, as well as possible nerve deficits related to brain metastases; she will likely need an MRI of her head for further evaluation.   Notified by  the nurse in the middle of the conversation with the patient, the patient began having difficulty speaking, and was only able to answer yes or no questions, and seemed frustrated that she was having difficulty speaking. She seemed mildly confused at this point in time as well. This episode lasted for all the couple of minutes before resolving spontaneously, and the patient rapidly returned to her baseline. She has no deficits currently. Neuro was consulted regarding the patient, and they will see the patient and recommend admission.  Family now at bedside. According to the daughter she called her mother at about 1, and her mother was having difficulty getting her words out, and her speech was garbled. The daughter immediately called her father, who came home from work about 3 miles away, and when he got home he noted  that her face seemed uneven so he called EMS; he is unsure which side of her face was drooping.Marland Kitchen EMS left there have said about 1610, and at that point in time all of her symptoms had resolved per the patient's husband.  The patient has had continued waxing and waning of her symptoms. Each episode lasts for several minutes before resolving. Neurology was able to witness one of these episodes, and per her recommendation a code stroke was called. However, do to her low NIHSS score, TPA is not warranted at this time. Her CT scan shows no acute abnormalities. MRI/MRA was ordered, and the patient is to be admitted to the hospitalist service on telemetry.   Theotis Burrow, MD 02/22/12 [phone]

## 2012-02-21 NOTE — ED Notes (Addendum)
returned from MRI, no changes, pending orders.

## 2012-02-21 NOTE — ED Notes (Signed)
Patient transported to CT 

## 2012-02-21 NOTE — ED Notes (Signed)
Paged CARELINK for CODE STROKE

## 2012-02-21 NOTE — ED Notes (Signed)
Oncoming RN, in for BS report, no changes in neuro exam, daughter at [facility], alert, NAD, calm, interactive, skin W&D, other RN in at [facility] with rad transport to take pt to MRI, pending admission bed assignment admission orders.

## 2012-02-21 NOTE — Consult Note (Signed)
Referring Physician: Ethelda Chick    Chief Complaint: Difficulty with speech, right facial droop  HPI: [name] [name] is an 71 y.o. female who spoke to her husband at 44 and was normal.. Patient then spoke to her daughter later and her speech was noted to be abnormal.  The patient's daughter called her father and the patient was found to have difficulty with speech and right facial droop.  EMS was called at that time and the patient was brought in for evaluation.  While in the ED the patient was felt to improve by staff.  She later worsened again and improved again.  The family feels that although she has improved,  She has not returned to baseline.  Current NIHSS is 1.  LSN: [id number] tPA Given: No: Improvement in symptoms  Past Medical History  Diagnosis Date  . History of breast cancer   . COPD (chronic obstructive pulmonary disease)   . GERD (gastroesophageal reflux disease)   . Diabetes mellitus     type II  . Hx of colonic polyps   . Osteoarthritis   . MVP (mitral valve prolapse)   . Dysrhythmia     pt states runs a little fast   . Shortness of breath     with exertion   . Depression   . Anxiety   . Cancer     left breast  . Heart murmur     Past Surgical History  Procedure Date  . Cesarean section 1980, 1982    X 2   . Laparoscopy     work up for infertility  . Tympanoplasty     left X 2  . Left breast fibroadenoma removal 1960's  . Breast cyst aspirations   . Left modified radial mastectomy   . Laparotomy 10/29/2011    Procedure: EXPLORATORY LAPAROTOMY;  Surgeon: Rejeana Brock A. Duard Brady, MD;  Location: [facility];  Service: Gynecology;  Laterality: N/A;  . Abdominal hysterectomy 10/29/2011    Procedure: HYSTERECTOMY ABDOMINAL;  Surgeon: Rejeana Brock A. Duard Brady, MD;  Location: [facility];  Service: Gynecology;  Laterality: N/A;  Total abdominal hysterectomy  . Salpingoophorectomy 10/29/2011    Procedure: SALPINGO OOPHERECTOMY;  Surgeon: Rejeana Brock A. Duard Brady, MD;  Location: [facility];  Service:  Gynecology;  Laterality: Bilateral;    Family History  Problem Relation Age of Onset  . Cancer Mother     endometrial/ colon  . Cancer Brother     colon  . COPD Other   . Hypertension Other   . Cancer Maternal Uncle     prostate/bladder/lung  . Cancer Paternal Aunt     unknown abdominal cancer  . Cancer Maternal Grandmother     Salivary gland cancer  . Cancer Paternal Aunt     leukemia   Social History:  reports that she has been smoking Cigarettes.  She has a 30 pack-year smoking history. She has never used smokeless tobacco. She reports that she does not drink alcohol or use illicit drugs.  Allergies: No Known Allergies  Medications: I have reviewed the patient's current medications. Prior to Admission:  Current outpatient prescriptions:acetaminophen (TYLENOL) 325 MG tablet, Take 650 mg by mouth every 6 (six) hours as needed. For pain, Disp: , Rfl: ;  albuterol-ipratropium (COMBIVENT) 18-103 MCG/ACT inhaler, Inhale 2 puffs into the lungs every 4 (four) hours as needed. For wheezing, Disp: , Rfl: ;  aspirin EC 81 MG tablet, Take 81 mg by mouth daily., Disp: , Rfl:  atenolol (TENORMIN) 50 MG tablet, Take 25 mg by  mouth 2 (two) times daily., Disp: , Rfl: ;  b complex vitamins tablet, Take 1 tablet by mouth daily.  , Disp: , Rfl: ;  budesonide-formoterol (SYMBICORT) 160-4.5 MCG/ACT inhaler, Inhale 2 puffs into the lungs 2 (two) times daily., Disp: 1 Inhaler, Rfl: 5;  buPROPion (WELLBUTRIN SR) 150 MG 12 hr tablet, Take 150 mg by mouth 2 (two) times daily. , Disp: , Rfl:  busPIRone (BUSPAR) 15 MG tablet, Take 15 mg by mouth 2 (two) times daily., Disp: , Rfl: ;  cholecalciferol (VITAMIN D) 1000 UNITS tablet, Take 1,000 Units by mouth daily.  , Disp: , Rfl: ;  dexamethasone (DECADRON) 4 MG tablet, Take 8 mg by mouth 2 (two) times daily with a meal. Take 2 tablets by mouth two times a day starting the day after chemotherapy for 3 days., Disp: , Rfl:  diazepam (VALIUM) 5 MG tablet, Take 5-10 mg  by mouth at bedtime as needed. For sleep, Disp: , Rfl: ;  Docusate Sodium (COLACE PO), Take 1 each by mouth daily as needed. For constipation  After treatments only, Disp: , Rfl: ;  ferrous sulfate 325 (65 FE) MG tablet, Take 325 mg by mouth daily with breakfast., Disp: , Rfl: ;  fish oil-omega-3 fatty acids 1000 MG capsule, Take 1 g by mouth daily., Disp: , Rfl:  ipratropium-albuterol (DUONEB) 0.5-2.5 (3) MG/3ML SOLN, Inhale 3 mLs into the lungs 3 (three) times daily. , Disp: , Rfl: ;  lidocaine-prilocaine (EMLA) cream, Apply 1 application topically as needed. For treatments, Disp: , Rfl: ;  Linaclotide (LINZESS) 290 MCG CAPS, Take 1 each by mouth daily as needed., Disp: 30 capsule, Rfl: 11;  loratadine (CLARITIN) 10 MG tablet, Take 10 mg by mouth daily., Disp: , Rfl:  lovastatin (MEVACOR) 40 MG tablet, Take 40 mg by mouth daily., Disp: , Rfl: ;  metFORMIN (GLUCOPHAGE) 1000 MG tablet, Take 1 tablet (1,000 mg total) by mouth 2 (two) times daily with a meal., Disp: 180 tablet, Rfl: 3;  naproxen sodium (ANAPROX) 220 MG tablet, Take 220 mg by mouth 2 (two) times daily with a meal., Disp: , Rfl: ;  omeprazole (PRILOSEC) 40 MG capsule, Take 1 capsule (40 mg total) by mouth daily., Disp: 90 capsule, Rfl: 3 ondansetron (ZOFRAN) 8 MG tablet, Take 8 mg by mouth every 12 (twelve) hours as needed. For nausea, Disp: , Rfl: ;  oxymetazoline (AFRIN) 0.05 % nasal spray, Place 2 sprays into the nose 2 (two) times daily as needed. Allergies , Disp: , Rfl: ;  prochlorperazine (COMPAZINE) 10 MG tablet, Take 10 mg by mouth every 6 (six) hours as needed. For nausea/vomiting, Disp: , Rfl:  sertraline (ZOLOFT) 100 MG tablet, Take 100 mg by mouth daily before breakfast., Disp: , Rfl: ;  DISCONTD: busPIRone (BUSPAR) 15 MG tablet, Take 1 tablet (15 mg total) by mouth 2 (two) times daily., Disp: 180 tablet, Rfl: 1;  DISCONTD: lovastatin (MEVACOR) 40 MG tablet, Take 1 tablet (40 mg total) by mouth at bedtime., Disp: 90 tablet, Rfl:  2 DISCONTD: sertraline (ZOLOFT) 100 MG tablet, Take 1 tablet (100 mg total) by mouth daily before breakfast., Disp: 90 tablet, Rfl: 2;  oxyCODONE-acetaminophen (PERCOCET/ROXICET) 5-325 MG per tablet, Take 1 tablet by mouth every 4 (four) hours as needed. For pain, Disp: , Rfl: ;  ranitidine (ZANTAC) 150 MG tablet, Take 150 mg by mouth 2 (two) times daily as needed. Heart burn , Disp: , Rfl:  traZODone (DESYREL) 50 MG tablet, Take 1 tablet (50 mg total) by  mouth at bedtime., Disp: 30 tablet, Rfl: 4;  DISCONTD: oxyCODONE-acetaminophen (ROXICET) 5-325 MG per tablet, Take 1 tablet by mouth every 4 (four) hours as needed for pain., Disp: 40 tablet, Rfl: 0;  DISCONTD: prochlorperazine (COMPAZINE) 10 MG tablet, Take 1 tablet (10 mg total) by mouth every 6 (six) hours as needed (Nausea or vomiting)., Disp: 30 tablet, Rfl: 3 DISCONTD: roflumilast (DALIRESP) 500 MCG TABS tablet, Take 500 mcg by mouth daily.  , Disp: , Rfl:   ROS: History obtained from the patient  General ROS: negative for - chills, fatigue, fever, night sweats, weight gain or weight loss Psychological ROS: negative for - behavioral disorder, hallucinations, memory difficulties, mood swings or suicidal ideation Ophthalmic ROS: negative for - blurry vision, double vision, eye pain or loss of vision ENT ROS: negative for - epistaxis, nasal discharge, oral lesions, sore throat, tinnitus or vertigo Allergy and Immunology ROS: negative for - hives or itchy/watery eyes Hematological and Lymphatic ROS: negative for - bleeding problems, bruising or swollen lymph nodes Endocrine ROS: negative for - galactorrhea, hair pattern changes, polydipsia/polyuria or temperature intolerance Respiratory ROS: negative for - cough, hemoptysis, shortness of breath or wheezing Cardiovascular ROS: negative for - chest pain, dyspnea on exertion, edema or irregular heartbeat Gastrointestinal ROS: negative for - abdominal pain, diarrhea, hematemesis, nausea/vomiting or  stool incontinence Genito-Urinary ROS: ovarian CA undergoing chemotherapy Musculoskeletal ROS: negative for - joint swelling or muscular weakness Neurological ROS: as noted in HPI Dermatological ROS: hair loss  Physical Examination: Blood pressure 129/60, pulse 88, temperature 97.8 F (36.6 C), temperature source Oral, resp. rate 18, SpO2 96.00%.  Neurologic Examination: Mental Status: Alert, oriented, thought content appropriate.  Speech fluent without evidence of aphasia.  Able to follow 3 step commands without difficulty. Cranial Nerves: II: visual fields grossly normal, pupils equal, round, reactive to light and accommodation III,IV, VI: ptosis not present, extra-ocular motions intact bilaterally V,VII: decrease in right NLF, facial light touch sensation normal bilaterally VIII: hearing normal bilaterally IX,X: gag reflex present XI: trapezius strength/neck flexion strength normal bilaterally XII: tongue strength normal  Motor: Right : Upper extremity   5/5    Left:     Upper extremity   5/5  Lower extremity   5/5     Lower extremity   5/5 Tone and bulk:normal tone throughout; no atrophy noted Sensory: Pinprick and light touch intact throughout, bilaterally Deep Tendon Reflexes: 2+ in the upper extremities, trace at the knees and absent at the ankles Plantars: Right: mute   Left: mute Cerebellar: normal finger-to-nose and normal heel-to-shin test   Laboratory Studies:  Basic Metabolic Panel:  Lab 02/21/12 1610 02/21/12 1641  NA 139 137  K 4.1 4.0  CL 104 101  CO2 -- 23  GLUCOSE 101* 104*  BUN 14 14  CREATININE 0.70 0.60  CALCIUM -- 9.8  MG -- --  PHOS -- --    Liver Function Tests:  Lab 02/21/12 1641  AST 16  ALT 14  ALKPHOS 120*  BILITOT 0.1*  PROT 7.4  ALBUMIN 3.8   No results found for this basename: LIPASE:5,AMYLASE:5 in the last 168 hours No results found for this basename: AMMONIA:3 in the last 168 hours  CBC:  Lab 02/21/12 1656 02/21/12 1641    WBC -- 12.9*  NEUTROABS -- 8.8*  HGB 12.2 11.5*  HCT 36.0 34.3*  MCV -- 89.3  PLT -- 347    Cardiac Enzymes:  Lab 02/21/12 1642  CKTOTAL --  CKMB --  CKMBINDEX --  TROPONINI <0.30    BNP: No components found with this basename: POCBNP:5  CBG:  Lab 02/21/12 1701  GLUCAP 108*    Microbiology: Results for orders placed during the hospital encounter of 10/24/11  SURGICAL PCR SCREEN     Status: Abnormal   Collection Time   10/24/11  1:24 PM      Component Value Range Status Comment   MRSA, PCR INVALID RESULTS, SPECIMEN SENT FOR CULTURE (*) NEGATIVE Final NOTIFIED SHARON [facility] [id number] @ 1552 BY J SCOTTON   Staphylococcus aureus INVALID RESULTS, SPECIMEN SENT FOR CULTURE (*) NEGATIVE Final   MRSA CULTURE     Status: Normal   Collection Time   10/24/11  1:24 PM      Component Value Range Status Comment   Specimen Description NOSE   Final    Special Requests NONE   Final    Culture     Final    Value: NO STAPHYLOCOCCUS AUREUS ISOLATED     Note: No MRSA Isolated   Report Status 10/27/2011 FINAL   Final     Coagulation Studies:  Basename 02/21/12 1641  LABPROT 12.8  INR 0.94    Urinalysis: No results found for this basename: COLORURINE:2,APPERANCEUR:2,LABSPEC:2,PHURINE:2,GLUCOSEU:2,HGBUR:2,BILIRUBINUR:2,KETONESUR:2,PROTEINUR:2,UROBILINOGEN:2,NITRITE:2,LEUKOCYTESUR:2 in the last 168 hours  Lipid Panel:    Component Value Date/Time   CHOL 170 08/30/2010 0923   TRIG 105.0 08/30/2010 0923   HDL 68.00 08/30/2010 0923   CHOLHDL 3 08/30/2010 0923   VLDL 21.0 08/30/2010 0923   LDLCALC 81 08/30/2010 0923    HgbA1C:  Lab Results  Component Value Date   HGBA1C 7.5* 10/04/2011    Urine Drug Screen:   No results found for this basename: labopia, cocainscrnur, labbenz, amphetmu, thcu, labbarb    Alcohol Level: No results found for this basename: ETH:2 in the last 168 hours  Imaging: Ct Head Wo Contrast  02/21/2012  *RADIOLOGY REPORT*  Clinical Data: Right sided facial  injury.  History of breast cancer.  CT HEAD WITHOUT CONTRAST  Technique:  Contiguous axial images were obtained from the base of the skull through the vertex without contrast.  Comparison: No priors.  Findings: There is mild cerebral and cerebellar atrophy.  There are patchy and confluent areas of decreased attenuation throughout the deep and periventricular white matter of the cerebral hemispheres bilaterally, compatible with chronic microvascular ischemic changes.  These findings are somewhat asymmetric, however, involving the periventricular white matter on the left to a greater extent than that on the right.  No evidence of acute intracranial hemorrhage, definite mass, mass effect, hydrocephalus or abnormal intra or extra-axial fluid collections.  No acute displaced skull fractures are identified.  Visualized paranasal sinuses and mastoids are well pneumatized.  IMPRESSION: 1.  Mild cerebral atrophy with evidence of chronic microvascular ischemic changes in the deep and periventricular white matter of the cerebral hemispheres bilaterally.  These findings are somewhat asymmetric involving the left cerebral white matter to a greater extent than the right.  Some of these regions are very ill-defined, and strictly speaking, areas of acute/subacute cerebral ischemia would be difficult to exclude on the basis of this examination (no priors are available for comparison).  If there is clinical concern for acute ischemia, further evaluation with brain MRI may be warranted.  Original Report Authenticated By: Florencia Reasons, M.D.    Assessment: 71 y.o. female presenting with fluctuating symptoms of slurred speech, expressive aphasia and right facial droop.  Patient now with only minimal findings on exam therefore not felt to be a  tPA candidate.  Very likely hypercoaguable secondary to her cancer diagnosis therefore can not rule out a thrombotic cause for her symptoms.  Head CT suggests the possibility of an embolic  cause as well.  Patient has had ASA today and takes this regularly at home as well.    Stroke Risk Factors - diabetes mellitus  Plan: 1. HgbA1c, fasting lipid panel 2. MRI, MRA  of the brain without contrast 3. PT consult, OT consult, Speech consult 4. Echocardiogram 5. Carotid dopplers 6. Prophylactic therapy-Antiplatelet med: Plavix - dose 75mg  daily 7. Risk factor modification 8. Telemetry monitoring 9. Frequent neuro checks   Thana Farr, MD [facility] [phone] 02/21/2012, 6:49 PM

## 2012-02-21 NOTE — Code Documentation (Signed)
Patient talked to husband today at 47, last known normal. Her daughter called her at 50 and stated that her moms speech was slurred and she was confused so much that she thought she had dialed the wrong number. Patients husband came home and called 911. Discrepancies arise from last seen normal once she arrived to the hospital. According to chart review, EMS reported patients symptoms had resolved upon arrival to hospital. No code stroke called. Family states that the patients symptoms of slow slurred speech, aphasia with right facial droop have been consistently present; however fluctuate in severity. For this reason, code stroke was initiated after neurology examination. NIHSS 3. Due to discrepancies between LKW and mild symptoms which are not debilitating, and are improving, the stroke team has designated her LKW as 1445 today; therefore TPA not given. MRI pending. Patient will be monitored closely with neurochecks q 15 min until 1915 per Dr. Thad Ranger.  Job Founds, MBA, [facility] Pager [phone]

## 2012-02-21 NOTE — ED Notes (Addendum)
Pt in via EMS- pt in c/o episodes of dysphasia, difficulty swallowing, some left sided facial droop, all symptoms have resolved at this time per EMS, pt is currently receiving chemo for ovarian cancer- pt states she does not remember episode at this time.

## 2012-02-21 NOTE — H&P (Signed)
PCP:   Sonda Primes, MD  ONC: [facility]  Chief Complaint:   Slurred speach  HPI: [name] [name] is a 71 y.o. female   has a past medical history of History of breast cancer; COPD (chronic obstructive pulmonary disease); GERD (gastroesophageal reflux disease); Diabetes mellitus; colonic polyps; Osteoarthritis; MVP (mitral valve prolapse); Dysrhythmia; Shortness of breath; Depression; Anxiety; Heart murmur; Cancer; and Ovarian cancer.   Presented with  3:45 pm she started to have episodic slurred speech lasting for few minutes at a time that is waxing and waining. No ataxia, no double vision. She developed Right facial droop that has persisted. She has been seen in ED by Neurology consult who recommended TIA work up. MRI already done and did not show any evidence of acute stroke metastatic disease could not be ruled out. MRA did show an suspected occluded left M2 branch.  In regards to her ovarian cancer she was diagnosed in March of this year. She had had lab laparotomy with resection and now is getting chemo therapy once every 3rd week with carboplatin taxol she is due for treatment on the 23 of July  Review of Systems:    Pertinent positives include: slurred speech facial droop  Constitutional:  No weight loss, night sweats, Fevers, chills, fatigue, weight loss  HEENT:  No headaches, Difficulty swallowing,Tooth/dental problems,Sore throat,  No sneezing, itching, ear ache, nasal congestion, post nasal drip,  Cardio-vascular:  No chest pain, Orthopnea, PND, anasarca, dizziness, palpitations.no Bilateral lower extremity swelling  GI:  No heartburn, indigestion, abdominal pain, nausea, vomiting, diarrhea, change in bowel habits, loss of appetite, melena, blood in stool, hematemesis Resp:  no shortness of breath at rest. No dyspnea on exertion, No excess mucus, no productive cough, No non-productive cough, No coughing up of blood.No change in color of mucus.No wheezing. Skin:  no rash  or lesions. No jaundice GU:  no dysuria, change in color of urine, no urgency or frequency. No straining to urinate.  No flank pain.  Musculoskeletal:  No joint pain or no joint swelling. No decreased range of motion. No back pain.  Psych:  No change in mood or affect. No depression or anxiety. No memory loss.  Neuro: no localizing neurological complaints, no tingling, no weakness, no double vision, no gait abnormality, no  no confusion  Otherwise ROS are negative except for above, 10 systems were reviewed  Past Medical History: Past Medical History  Diagnosis Date  . History of breast cancer   . COPD (chronic obstructive pulmonary disease)   . GERD (gastroesophageal reflux disease)   . Diabetes mellitus     type II  . Hx of colonic polyps   . Osteoarthritis   . MVP (mitral valve prolapse)   . Dysrhythmia     pt states runs a little fast   . Shortness of breath     with exertion   . Depression   . Anxiety   . Heart murmur   . Cancer     left breast  . Ovarian cancer     active chemotherapy   Past Surgical History  Procedure Date  . Cesarean section 1980, 1982    X 2   . Laparoscopy     work up for infertility  . Tympanoplasty     left X 2  . Left breast fibroadenoma removal 1960's  . Breast cyst aspirations   . Left modified radial mastectomy   . Laparotomy 10/29/2011    Procedure: EXPLORATORY LAPAROTOMY;  Surgeon: Rejeana Brock A.  Duard Brady, MD;  Location: [facility];  Service: Gynecology;  Laterality: N/A;  . Abdominal hysterectomy 10/29/2011    Procedure: HYSTERECTOMY ABDOMINAL;  Surgeon: Rejeana Brock A. Duard Brady, MD;  Location: [facility];  Service: Gynecology;  Laterality: N/A;  Total abdominal hysterectomy  . Salpingoophorectomy 10/29/2011    Procedure: SALPINGO OOPHERECTOMY;  Surgeon: Rejeana Brock A. Duard Brady, MD;  Location: [facility];  Service: Gynecology;  Laterality: Bilateral;     Medications: Prior to Admission medications   Medication Sig Start Date End Date Taking? Authorizing Provider    acetaminophen (TYLENOL) 325 MG tablet Take 650 mg by mouth every 6 (six) hours as needed. For pain   Yes Historical Provider, MD  albuterol-ipratropium (COMBIVENT) 18-103 MCG/ACT inhaler Inhale 2 puffs into the lungs every 4 (four) hours as needed. For wheezing 10/28/11  Yes Georgina Quint Plotnikov, MD  aspirin EC 81 MG tablet Take 81 mg by mouth daily.   Yes Historical Provider, MD  atenolol (TENORMIN) 50 MG tablet Take 25 mg by mouth 2 (two) times daily. 08/26/11  Yes Georgina Quint Plotnikov, MD  b complex vitamins tablet Take 1 tablet by mouth daily.     Yes Historical Provider, MD  budesonide-formoterol (SYMBICORT) 160-4.5 MCG/ACT inhaler Inhale 2 puffs into the lungs 2 (two) times daily. 10/03/11  Yes Georgina Quint Plotnikov, MD  buPROPion (WELLBUTRIN SR) 150 MG 12 hr tablet Take 150 mg by mouth 2 (two) times daily.  07/02/11 07/01/12 Yes Georgina Quint Plotnikov, MD  busPIRone (BUSPAR) 15 MG tablet Take 15 mg by mouth 2 (two) times daily. 02/19/12 02/18/13 Yes Georgina Quint Plotnikov, MD  cholecalciferol (VITAMIN D) 1000 UNITS tablet Take 1,000 Units by mouth daily.     Yes Historical Provider, MD  dexamethasone (DECADRON) 4 MG tablet Take 8 mg by mouth 2 (two) times daily with a meal. Take 2 tablets by mouth two times a day starting the day after chemotherapy for 3 days. 12/02/11 12/01/12 Yes Amy Allegra Grana, PA  diazepam (VALIUM) 5 MG tablet Take 5-10 mg by mouth at bedtime as needed. For sleep 02/04/12  Yes Lowella Dell, MD  Docusate Sodium (COLACE PO) Take 1 each by mouth daily as needed. For constipation  After treatments only   Yes Historical Provider, MD  ferrous sulfate 325 (65 FE) MG tablet Take 325 mg by mouth daily with breakfast.   Yes Historical Provider, MD  fish oil-omega-3 fatty acids 1000 MG capsule Take 1 g by mouth daily.   Yes Historical Provider, MD  ipratropium-albuterol (DUONEB) 0.5-2.5 (3) MG/3ML SOLN Inhale 3 mLs into the lungs 3 (three) times daily.  10/02/11  Yes Georgina Quint Plotnikov, MD   lidocaine-prilocaine (EMLA) cream Apply 1 application topically as needed. For treatments 12/02/11 12/01/12 Yes Amy Allegra Grana, PA  Linaclotide (LINZESS) 290 MCG CAPS Take 1 each by mouth daily as needed. 01/07/12  Yes Georgina Quint Plotnikov, MD  loratadine (CLARITIN) 10 MG tablet Take 10 mg by mouth daily.   Yes Historical Provider, MD  lovastatin (MEVACOR) 40 MG tablet Take 40 mg by mouth daily. 08/26/11  Yes Georgina Quint Plotnikov, MD  metFORMIN (GLUCOPHAGE) 1000 MG tablet Take 1 tablet (1,000 mg total) by mouth 2 (two) times daily with a meal. 09/12/11  Yes Georgina Quint Plotnikov, MD  naproxen sodium (ANAPROX) 220 MG tablet Take 220 mg by mouth 2 (two) times daily with a meal.   Yes Historical Provider, MD  omeprazole (PRILOSEC) 40 MG capsule Take 1 capsule (40 mg total) by mouth daily. 06/25/11 06/24/12  Yes Tresa Garter, MD  ondansetron (ZOFRAN) 8 MG tablet Take 8 mg by mouth every 12 (twelve) hours as needed. For nausea 12/02/11 12/01/12 Yes Amy Allegra Grana, PA  oxymetazoline (AFRIN) 0.05 % nasal spray Place 2 sprays into the nose 2 (two) times daily as needed. Allergies    Yes Historical Provider, MD  prochlorperazine (COMPAZINE) 10 MG tablet Take 10 mg by mouth every 6 (six) hours as needed. For nausea/vomiting 12/02/11 12/01/12 Yes Amy Allegra Grana, PA  sertraline (ZOLOFT) 100 MG tablet Take 100 mg by mouth daily before breakfast. 12/19/11  Yes Tresa Garter, MD  oxyCODONE-acetaminophen (PERCOCET/ROXICET) 5-325 MG per tablet Take 1 tablet by mouth every 4 (four) hours as needed. For pain 11/02/11 11/12/11  Brock Bad, MD  ranitidine (ZANTAC) 150 MG tablet Take 150 mg by mouth 2 (two) times daily as needed. Heart burn  12/26/10 12/26/11  Georgina Quint Plotnikov, MD  traZODone (DESYREL) 50 MG tablet Take 1 tablet (50 mg total) by mouth at bedtime. 12/18/11 01/17/12  Lowella Dell, MD    Allergies:  No Known Allergies  Social History:  Ambulatory  independently  Lives at  Home alone   reports that she  has been smoking Cigarettes.  She has a 30 pack-year smoking history. She has never used smokeless tobacco. She reports that she does not drink alcohol or use illicit drugs.   Family History: family history includes COPD in her other; Cancer in her brother, maternal grandmother, maternal uncle, mother, and paternal aunts; and Hypertension in her other.    Physical Exam: Patient Vitals for the past 24 hrs:  BP Temp Temp src Pulse Resp SpO2  02/21/12 1821 129/60 mmHg - - 88  18  96 %  02/21/12 1635 133/72 mmHg 97.8 F (36.6 C) Oral 85  18  98 %    1. General:  in No Acute distress 2. Psychological: Alert and  Oriented 3. Head/ENT:   Moist Mucous Membranes                          Head Non traumatic, neck supple                          Normal  Dentition 4. SKIN:  decreased Skin turgor,  Skin clean Dry and intact no rash 5. Heart: Regular rate and rhythm no Murmur, Rub or gallop 6. Lungs: Clear to auscultation bilaterally, no wheezes or crackles, somewhat distant   7. Abdomen: Soft, non-tender, Non distended Well  healed scar present 8. Lower extremities: no clubbing, cyanosis, or edema 9. Neurologically Grossly intact, moving all 4 extremities equally 10. MSK: Normal range of motion  body mass index is unknown because there is no height or weight on file.   Labs on Admission:   [facility] 02/21/12 1656 02/21/12 1641  NA 139 137  K 4.1 4.0  CL 104 101  CO2 -- 23  GLUCOSE 101* 104*  BUN 14 14  CREATININE 0.70 0.60  CALCIUM -- 9.8  MG -- --  PHOS -- --    Basename 02/21/12 1641  AST 16  ALT 14  ALKPHOS 120*  BILITOT 0.1*  PROT 7.4  ALBUMIN 3.8   No results found for this basename: LIPASE:2,AMYLASE:2 in the last 72 hours  Basename 02/21/12 1656 02/21/12 1641  WBC -- 12.9*  NEUTROABS -- 8.8*  HGB 12.2 11.5*  HCT 36.0 34.3*  MCV --  89.3  PLT -- 347    Basename 02/21/12 1642  CKTOTAL --  CKMB --  CKMBINDEX --  TROPONINI <0.30   No results found for this  basename: TSH,T4TOTAL,FREET3,T3FREE,THYROIDAB in the last 72 hours No results found for this basename: VITAMINB12:2,FOLATE:2,FERRITIN:2,TIBC:2,IRON:2,RETICCTPCT:2 in the last 72 hours Lab Results  Component Value Date   HGBA1C 7.5* 10/04/2011    The CrCl is unknown because both a height and weight (above a minimum accepted value) are required for this calculation. ABG    Component Value Date/Time   TCO2 22 02/21/2012 1656     Lab Results  Component Value Date   DDIMER  Value: <0.22        AT THE INHOUSE ESTABLISHED CUTOFF VALUE OF 0.48 ug/mL FEU, THIS ASSAY HAS BEEN DOCUMENTED IN THE LITERATURE TO HAVE 04/25/2008     Other results:  I have pearsonaly reviewed this: ECG REPORT  Rate:85  Rhythm: NSR ST&T Change: no ischemia  UA no evidence of infretion  Cultures:    Component Value Date/Time   SDES NOSE 10/24/2011 1324   SPECREQUEST NONE 10/24/2011 1324   CULT  Value: NO STAPHYLOCOCCUS AUREUS ISOLATED Note: No MRSA Isolated 10/24/2011 1324   REPTSTATUS 10/27/2011 FINAL 10/24/2011 1324       Radiological Exams on Admission: Ct Head Wo Contrast  02/21/2012  *RADIOLOGY REPORT*  Clinical Data: Right sided facial injury.  History of breast cancer.  CT HEAD WITHOUT CONTRAST  Technique:  Contiguous axial images were obtained from the base of the skull through the vertex without contrast.  Comparison: No priors.  Findings: There is mild cerebral and cerebellar atrophy.  There are patchy and confluent areas of decreased attenuation throughout the deep and periventricular white matter of the cerebral hemispheres bilaterally, compatible with chronic microvascular ischemic changes.  These findings are somewhat asymmetric, however, involving the periventricular white matter on the left to a greater extent than that on the right.  No evidence of acute intracranial hemorrhage, definite mass, mass effect, hydrocephalus or abnormal intra or extra-axial fluid collections.  No acute displaced skull  fractures are identified.  Visualized paranasal sinuses and mastoids are well pneumatized.  IMPRESSION: 1.  Mild cerebral atrophy with evidence of chronic microvascular ischemic changes in the deep and periventricular white matter of the cerebral hemispheres bilaterally.  These findings are somewhat asymmetric involving the left cerebral white matter to a greater extent than the right.  Some of these regions are very ill-defined, and strictly speaking, areas of acute/subacute cerebral ischemia would be difficult to exclude on the basis of this examination (no priors are available for comparison).  If there is clinical concern for acute ischemia, further evaluation with brain MRI may be warranted.  Original Report Authenticated By: Florencia Reasons, M.D.    Chart has been reviewed  Assessment/Plan   71 year old female presents history her speech and right facial droop but TIA workup shows history of ovarian cancer   Present on Admission:  .Unspecified transient cerebral ischemia -  - will admit based on TIA/CVA protocol, await results of Carotid Doppler and Echo, obtain cardiac enzymes,  ECG,  Lipid panel, TSH. Order PT/OT evaluation. Will make sure patient is on antiplatelet agent.Neurology consult is aware.      .Ovarian cancer - left oncology note patient is here in the morning  .DM w/o Complication Type II - sliding scale insulin hold metformin   Prophylaxis: SCD  CODE STATUS: FULL CODE  Other plan as per orders.  I have spent a  total of on this admission  Shaana Acocella 02/21/2012, 8:37 PM

## 2012-02-21 NOTE — ED Provider Notes (Addendum)
Patient reportedly with episode of difficulty speaking and mouth droop earlier today. She is presently asymptomatic. On exam she is alert Glasgow Coma Score 15 moves all extremity well HEENT exam no facial asymmetry neck supple trachea midline lungs clear auscultation heart regular rhythm all 4 extremities nontender neurovascular intact. Neurologically Glasgow Coma Score 15 cranial nerves II through XII intact moves all extremities well motor strength 5 over 5 overall DTRs symmetric bilaterally at knee jerk ankle jerk and biceps toes are going bilaterally.  Doug Sou, MD 02/21/12 1651  Addendum. 1805 p.m. Patient had several more episodes of expressive aphasia. Dr. Thad Ranger, neurologist service consult and requests code stroke to be called. On reexamination at 1805 p.m. patient is awake alert Glasgow Coma Score 15 speaks in sentences moves all extremities.  Doug Sou, MD 02/21/12 1813 CRITICAL CARE Performed by: Doug Sou   Total critical care time: 30 minute  Critical care time was exclusive of separately billable procedures and treating other patients.  Critical care was necessary to treat or prevent imminent or life-threatening deterioration.  Critical care was time spent personally by me on the following activities: development of treatment plan with patient and/or surrogate as well as nursing, discussions with consultants, evaluation of patient's response to treatment, examination of patient, obtaining history from patient or surrogate, ordering and performing treatments and interventions, ordering and review of laboratory studies, ordering and review of radiographic studies, pulse oximetry and re-evaluation of patient's condition.  Doug Sou, MD 02/21/12 2043

## 2012-02-21 NOTE — ED Notes (Signed)
Pending family return, admission orders, admitting MD at [facility], no changes, VSS.

## 2012-02-22 ENCOUNTER — Inpatient Hospital Stay (HOSPITAL_COMMUNITY): Payer: Medicare Other

## 2012-02-22 ENCOUNTER — Encounter (HOSPITAL_COMMUNITY): Payer: Self-pay | Admitting: *Deleted

## 2012-02-22 DIAGNOSIS — R4789 Other speech disturbances: Secondary | ICD-10-CM

## 2012-02-22 DIAGNOSIS — K5900 Constipation, unspecified: Secondary | ICD-10-CM

## 2012-02-22 LAB — LIPID PANEL
Cholesterol: 170 mg/dL (ref 0–200)
HDL: 50 mg/dL (ref >39)
LDL Cholesterol: 87 mg/dL (ref 0–99)
Triglycerides: 165 mg/dL — ABNORMAL HIGH (ref <150)
VLDL: 33 mg/dL (ref 0–40)

## 2012-02-22 LAB — GLUCOSE, CAPILLARY
Glucose-Capillary: 86 mg/dL (ref 70–99)
Glucose-Capillary: 171 mg/dL — ABNORMAL HIGH (ref 70–99)
Glucose-Capillary: 136 mg/dL — ABNORMAL HIGH (ref 70–99)

## 2012-02-22 LAB — HEMOGLOBIN A1C: Hgb A1c MFr Bld: 7 % — ABNORMAL HIGH (ref <5.7)

## 2012-02-22 MED ORDER — SODIUM CHLORIDE 0.9 % IV SOLN
INTRAVENOUS | Status: DC
  Administered 2012-02-22: 12:00:00 via INTRAVENOUS
  Administered 2012-02-23: 1000 mL via INTRAVENOUS

## 2012-02-22 MED ORDER — IPRATROPIUM BROMIDE 0.02 % IN SOLN: 0.5000 mg | Freq: Three times a day (TID) | RESPIRATORY_TRACT | Status: DC

## 2012-02-22 MED ORDER — GADOBENATE DIMEGLUMINE 529 MG/ML IV SOLN
10.0000 mL | Freq: Once | INTRAVENOUS | Status: AC | PRN
  Administered 2012-02-22: 10 mL via INTRAVENOUS

## 2012-02-22 MED ORDER — CLOPIDOGREL BISULFATE 75 MG PO TABS
75.0000 mg | ORAL_TABLET | Freq: Every day | ORAL | Status: DC
  Administered 2012-02-22 – 2012-02-24 (×3): 75 mg via ORAL
  Filled 2012-02-22 (×4): qty 1

## 2012-02-22 MED ORDER — ALBUTEROL SULFATE (5 MG/ML) 0.5% IN NEBU: 2.5000 mg | INHALATION_SOLUTION | Freq: Three times a day (TID) | RESPIRATORY_TRACT | Status: DC

## 2012-02-22 MED ORDER — SODIUM CHLORIDE 0.9 % IJ SOLN
10.0000 mL | INTRAMUSCULAR | Status: DC | PRN
  Administered 2012-02-22 – 2012-02-24 (×3): 10 mL

## 2012-02-22 NOTE — Evaluation (Signed)
Physical Therapy Evaluation Patient Details Name: [name] [name] MRN: [id number] DOB: 22-Nov-1940 Today's Date: 02/22/2012 Time: 1308-6578 PT Time Calculation (min): 30 min  PT Assessment / Plan / Recommendation Clinical Impression  Pt admitted with change in personality, trouble speaking. Pt with decreased balance during ambulation requiring increased supervision. Pt will benefit from skilled PT in the acute care setting in order to maximize functional mobility and safety prior to d/c home    PT Assessment  Patient needs continued PT services    Follow Up Recommendations  Home health PT;Supervision/Assistance - 24 hour       Equipment Recommendations  None recommended by OT;Rolling walker with 5" wheels       Frequency Min 4X/week    Precautions / Restrictions Precautions Precautions: Fall Precaution Comments: Pt has had increasingly more balance problems as her cancer treatments have progressed. Restrictions Weight Bearing Restrictions: No         Mobility  Bed Mobility Bed Mobility: Supine to Sit;Sitting - Scoot to Edge of Bed Supine to Sit: 6: Modified independent (Device/Increase time) Sitting - Scoot to Edge of Bed: 6: Modified independent (Device/Increase time) Transfers Transfers: Sit to Stand;Stand to Sit Sit to Stand: 4: Min guard;From bed;With upper extremity assist Stand to Sit: 5: Supervision;To chair/3-in-1;To bed;With upper extremity assist Details for Transfer Assistance: Pt with LOB upon standing up from bed. Ambulation/Gait Ambulation/Gait Assistance: 4: Min guard;4: Min Environmental consultant (Feet): 150 Feet Assistive device: None Ambulation/Gait Assistance Details: Minguard for majority of ambulation with min assist at times secondary to unsteadiness and right lean. Discussed with pt the possible need for AD upon d/c for safety. Gait Pattern: Narrow base of support;Lateral trunk lean to right;Step-to pattern Gait velocity: decreased gait  speed Stairs: Yes Stairs Assistance: 5: Supervision Stairs Assistance Details (indicate cue type and reason): VC for proper sequencing. Supervision for safety Stair Management Technique: No rails Number of Stairs: 5  Modified Rankin (Stroke Patients Only) Pre-Morbid Rankin Score: No symptoms Modified Rankin: Moderately severe disability    Exercises     PT Diagnosis: Difficulty walking;Altered mental status  PT Problem List: Decreased activity tolerance;Decreased balance;Decreased mobility;Decreased knowledge of use of DME;Decreased safety awareness;Decreased knowledge of precautions PT Treatment Interventions: DME instruction;Gait training;Stair training;Functional mobility training;Therapeutic activities;Therapeutic exercise;Balance training;Patient/family education;Neuromuscular re-education   PT Goals Acute Rehab PT Goals PT Goal Formulation: With patient/family Time For Goal Achievement: 02/29/12 Potential to Achieve Goals: Fair  Visit Information  Last PT Received On: 02/22/12 Assistance Needed: +1 PT/OT Co-Evaluation/Treatment: Yes    Subjective Data      Prior Functioning  Home Living Lives With: Spouse Available Help at Discharge: Family;Other (Comment) (husband works daily, varied schedule) Type of Home: House Home Access: Stairs to enter Secretary/administrator of Steps: 2 Entrance Stairs-Rails: None Home Layout: One level Bathroom Shower/Tub: Forensic psychologist: Other (comment);Grab bars in shower (pt states she has a walker that was her father's) Prior Function Level of Independence: Independent Able to Take Stairs?: Yes Driving: Yes Communication Communication: Expressive difficulties Dominant Hand: Right    Cognition  Overall Cognitive Status: Impaired Area of Impairment: Problem solving;Other (comment) (slow to process information) Arousal/Alertness: Awake/alert Orientation Level: Appears  intact for tasks assessed Behavior During Session: Flat affect Problem Solving: Needs further evaluation.    Extremity/Trunk Assessment Right Upper Extremity Assessment RUE ROM/Strength/Tone: Within functional levels Left Upper Extremity Assessment LUE ROM/Strength/Tone: Within functional levels Right Lower Extremity Assessment RLE ROM/Strength/Tone: Within functional levels RLE Sensation: WFL -  Light Touch Left Lower Extremity Assessment LLE ROM/Strength/Tone: Within functional levels LLE Sensation: WFL - Light Touch Trunk Assessment Trunk Assessment: Normal   Balance Balance Balance Assessed: Yes Static Sitting Balance Static Sitting - Balance Support: Feet supported Static Sitting - Level of Assistance: 7: Independent  End of Session PT - End of Session Equipment Utilized During Treatment: Gait belt Activity Tolerance: Patient tolerated treatment well Patient left: in chair;with call bell/phone within reach;with family/visitor present Nurse Communication: Mobility status   Milana Kidney 02/22/2012, 10:13 AM  02/22/2012 Milana Kidney DPT PAGER: [phone] OFFICE: [phone]

## 2012-02-22 NOTE — Progress Notes (Signed)
CARE MANAGEMENT NOTE 02/22/2012  Patient:  [name] [name], [name] [name]   Account Number:  [id number]  Date Initiated:  02/22/2012  Documentation initiated by:  Vance Peper  Subjective/Objective Assessment:     Action/Plan:   CM provided patient with private duty Agency list. Daughters state they will be assisting mom at discharge but will keep for futher reference. Week day CM will followup for any HH needs identified.   Anticipated DC Date:     Anticipated DC Plan:        DC Planning Services  CM consult      Choice offered to / List presented to:             Status of service:  In process, will continue to follow Medicare Important Message given?   (If response is "NO", the following Medicare IM given date fields will be blank) Date Medicare IM given:   Date Additional Medicare IM given:    Discharge Disposition:    Per UR Regulation:    If discussed at Long Length of Stay Meetings, dates discussed:    Comments:

## 2012-02-22 NOTE — Progress Notes (Signed)
VASCULAR LAB PRELIMINARY  PRELIMINARY  PRELIMINARY  PRELIMINARY  Carotid duplex  completed.    Preliminary report:  Bilateral:  No evidence of hemodynamically significant internal carotid artery stenosis.   Vertebral artery flow is antegrade.      [name] [name], RVT 02/22/2012, 11:22 AM

## 2012-02-22 NOTE — ED Provider Notes (Signed)
I have personally seen and examined the patient.  I have discussed the plan of care with the resident.  I have reviewed the documentation on PMH/FH/Soc. History.  I have reviewed the documentation of the resident and agree.  Doug Sou, MD 02/22/12 [phone]

## 2012-02-22 NOTE — Evaluation (Signed)
Occupational Therapy Evaluation Patient Details Name: [name] [name] MRN: [id number] DOB: [birth year] Today's Date: 02/22/2012 Time: 4098-1191 OT Time Calculation (min): 30 min  OT Assessment / Plan / Recommendation Clinical Impression  Pt admitted with slurred speech, facial droop and confusion.  Pt is also currently being treated with chemotherapy for ovarian cancer.  Pt and husband have noticed pt having more balance issues as cancer treatments have progressed.  Discussed with pt and husband possible need for more supervision and assistance at home.  Pt needs more assessment of cognition and safety awareness at home.    OT Assessment  Patient needs continued OT Services    Follow Up Recommendations  Supervision/Assistance - 24 hour    Barriers to Discharge      Equipment Recommendations  None recommended by OT    Recommendations for Other Services    Frequency  Min 2X/week    Precautions / Restrictions Precautions Precautions: Fall Precaution Comments: Pt has had increasingly more balance problems as her cancer treatments have progressed. Restrictions Weight Bearing Restrictions: No   Pertinent Vitals/Pain *no pain    ADL  Eating/Feeding: Simulated;Independent Where Assessed - Eating/Feeding: Bed level Grooming: Simulated;Supervision/safety Where Assessed - Grooming: Unsupported sitting Upper Body Bathing: Simulated;Supervision/safety Where Assessed - Upper Body Bathing: Unsupported sitting Lower Body Bathing: Simulated;Supervision/safety Where Assessed - Lower Body Bathing: Unsupported sitting;Supported sit to stand Upper Body Dressing: Performed;Minimal assistance (due to IV line) Where Assessed - Upper Body Dressing: Unsupported sitting Lower Body Dressing: Performed;Supervision/safety Where Assessed - Lower Body Dressing: Unsupported sitting;Supported sit to stand Toilet Transfer: Simulated;Minimal assistance Toilet Transfer Method: Sit to stand Tub/Shower  Transfer: Performed;Minimal assistance Tub/Shower Transfer Method: Ambulating Tub/Shower Transfer Equipment: Grab bars Equipment Used: Gait belt Transfers/Ambulation Related to ADLs: Ambulated with min guard to min assist with LOB noted to R. ADL Comments: Discussed with pt and her husband tub seat options if pt's weakness progresses with her cancer treatments.  Also discussed possible need for more supervision at home.  RN to ask case manager to address.    OT Diagnosis: Generalized weakness;Cognitive deficits  OT Problem List: Impaired balance (sitting and/or standing);Decreased cognition;Decreased knowledge of use of DME or AE OT Treatment Interventions: Cognitive remediation/compensation;Patient/family education;Energy conservation;DME and/or AE instruction   OT Goals Acute Rehab OT Goals OT Goal Formulation: With patient Time For Goal Achievement: 02/29/12 Potential to Achieve Goals: Good Miscellaneous OT Goals Miscellaneous OT Goal #1: Pt and husband will be knowledgeable in  DME to increase safety and energy conservation with showering. OT Goal: Miscellaneous Goal #1 - Progress: Goal set today Miscellaneous OT Goal #2: Pt will verbalize at least 3 energy conservation strategies. OT Goal: Miscellaneous Goal #2 - Progress: Goal set today Miscellaneous OT Goal #3: Pt will  verbally respond appropriately to safety scenarios she may encounter at home if left alone. OT Goal: Miscellaneous Goal #3 - Progress: Goal set today  Visit Information  Last OT Received On: 02/22/12 Assistance Needed: +1 PT/OT Co-Evaluation/Treatment: Yes    Subjective Data  Subjective: "I may be needing more help." Patient Stated Goal: Home with husband   Prior Functioning  Vision/Perception  Home Living Lives With: Spouse Available Help at Discharge: Family;Other (Comment) (husband works daily, varied schedule) Type of Home: House Home Access: Stairs to enter [employer] of Steps:  2 Entrance Stairs-Rails: None Home Layout: One level Bathroom Shower/Tub: Tub/shower unit;Curtain Firefighter: Standard Home Adaptive Equipment: Other (comment);Grab bars in shower (pt states she has a walker that was her father's) Prior Function  Level of Independence: Independent Able to Take Stairs?: Yes Driving: Yes Communication Communication: Expressive difficulties Dominant Hand: Right      Cognition  Overall Cognitive Status: Impaired Area of Impairment: Problem solving;Other (comment) (slow to process information) Arousal/Alertness: Awake/alert Orientation Level: Appears intact for tasks assessed Behavior During Session: Flat affect Problem Solving: Needs further evaluation.    Extremity/Trunk Assessment Right Upper Extremity Assessment RUE ROM/Strength/Tone: Within functional levels Left Upper Extremity Assessment LUE ROM/Strength/Tone: Within functional levels Trunk Assessment Trunk Assessment: Normal   Mobility Bed Mobility Bed Mobility: Supine to Sit;Sitting - Scoot to Edge of Bed Supine to Sit: 6: Modified independent (Device/Increase time) Sitting - Scoot to Edge of Bed: 6: Modified independent (Device/Increase time) Transfers Transfers: Sit to Stand;Stand to Sit Sit to Stand: 4: Min guard;From bed;With upper extremity assist Stand to Sit: 5: Supervision;To chair/3-in-1;To bed;With upper extremity assist Details for Transfer Assistance: Pt with LOB upon standing up from bed.   Exercise    Balance Balance Balance Assessed: Yes Static Sitting Balance Static Sitting - Balance Support: Feet supported Static Sitting - Level of Assistance: 7: Independent  End of Session OT - End of Session Equipment Utilized During Treatment: Gait belt Activity Tolerance: Patient tolerated treatment well Patient left: in chair;with call bell/phone within reach;with family/visitor present Nurse Communication: Other (comment);Mobility status (husb and pt interested in  personal care services for home)  GO     Evern Bio 02/22/2012, 10:08 AM

## 2012-02-22 NOTE — Progress Notes (Signed)
Patient ID: [name] [name]  female  ZOX:[id number]    DOB: 1940-09-25    DOA: 02/21/2012  PCP: Sonda Primes, MD  Subjective: Facial drooping resolved, no new symptoms, speech better, family at bedside. No chest pain, shortness of breath, fevers, chills.  Objective: Weight change:   Intake/Output Summary (Last 24 hours) at 02/22/12 1210 Last data filed at 02/22/12 0810  Gross per 24 hour  Intake    240 ml  Output      0 ml  Net    240 ml   Blood pressure 98/69, pulse 85, temperature 98.3 F (36.8 C), temperature source Oral, resp. rate 20, height 5\' 4"  (1.626 m), weight 56.416 kg (124 lb 6 oz), SpO2 92.00%.  Physical Exam: General: Alert and awake, oriented x3, not in any acute distress. HEENT: anicteric sclera, pupils reactive to light and accommodation, EOMI CVS: S1-S2 clear, no murmur rubs or gallops Chest: clear to auscultation bilaterally, no wheezing, rales or rhonchi Abdomen: soft nontender, nondistended, normal bowel sounds, no organomegaly Extremities: no cyanosis, clubbing or edema noted bilaterally Neuro: Cranial nerves II-XII intact, no new focal neurological deficits  Lab Results: Basic Metabolic Panel:  Lab 02/21/12 8119 02/21/12 1641  NA 139 137  K 4.1 4.0  CL 104 101  CO2 -- 23  GLUCOSE 101* 104*  BUN 14 14  CREATININE 0.70 0.60  CALCIUM -- 9.8  MG -- --  PHOS -- --   Liver Function Tests:  Lab 02/21/12 1641  AST 16  ALT 14  ALKPHOS 120*  BILITOT 0.1*  PROT 7.4  ALBUMIN 3.8   No results found for this basename: LIPASE:2,AMYLASE:2 in the last 168 hours No results found for this basename: AMMONIA:2 in the last 168 hours CBC:  Lab 02/21/12 1656 02/21/12 1641  WBC -- 12.9*  NEUTROABS -- 8.8*  HGB 12.2 11.5*  HCT 36.0 34.3*  MCV -- 89.3  PLT -- 347   Cardiac Enzymes:  Lab 02/21/12 2300 02/21/12 1642  CKTOTAL 92 --  CKMB 1.9 --  CKMBINDEX -- --  TROPONINI <0.30 <0.30   BNP: No components found with this basename:  POCBNP:2 CBG:  Lab 02/22/12 0812 02/22/12 0417 02/22/12 0104 02/21/12 1701  GLUCAP 122* 86 121* 108*     Micro Results: No results found for this or any previous visit (from the past 240 hour(s)).  Studies/Results: Ct Head Wo Contrast  02/21/2012  *RADIOLOGY REPORT*  Clinical Data: Right sided facial injury.  History of breast cancer.  CT HEAD WITHOUT CONTRAST  Technique:  Contiguous axial images were obtained from the base of the skull through the vertex without contrast.  Comparison: No priors.  Findings: There is mild cerebral and cerebellar atrophy.  There are patchy and confluent areas of decreased attenuation throughout the deep and periventricular white matter of the cerebral hemispheres bilaterally, compatible with chronic microvascular ischemic changes.  These findings are somewhat asymmetric, however, involving the periventricular white matter on the left to a greater extent than that on the right.  No evidence of acute intracranial hemorrhage, definite mass, mass effect, hydrocephalus or abnormal intra or extra-axial fluid collections.  No acute displaced skull fractures are identified.  Visualized paranasal sinuses and mastoids are well pneumatized.  IMPRESSION: 1.  Mild cerebral atrophy with evidence of chronic microvascular ischemic changes in the deep and periventricular white matter of the cerebral hemispheres bilaterally.  These findings are somewhat asymmetric involving the left cerebral white matter to a greater extent than the right.  Some of  these regions are very ill-defined, and strictly speaking, areas of acute/subacute cerebral ischemia would be difficult to exclude on the basis of this examination (no priors are available for comparison).  If there is clinical concern for acute ischemia, further evaluation with brain MRI may be warranted.  Original Report Authenticated By: Florencia Reasons, M.D.   Mr Maxine Glenn Head Wo Contrast  02/21/2012  *RADIOLOGY REPORT*  Clinical Data:   71 year old female Code stroke.  Right facial droop and aphasia.  History of breast cancer.  Comparison: Head CT without contrast 02/21/2012.  MRI HEAD WITHOUT CONTRAST  Technique: Multiplanar, multiecho pulse sequences of the brain and surrounding structures were obtained according to standard protocol without intravenous contrast.  Findings: Partially empty sella.  Mildly heterogeneous diffusion imaging, but No restricted diffusion to suggest acute infarction. Major intracranial vascular flow voids are preserved.  No ventriculomegaly. No midline shift, mass effect, or evidence of mass lesion.  No acute intracranial hemorrhage identified. Negative cervicomedullary junction.  Negative for age cervical spine (degenerative ligamentous hypertrophy about the odontoid. Visualized bone marrow signal is within normal limits.  Moderate for age patchy and confluent cerebral white matter T2 and FLAIR hyperintensity.  Mild to moderate pontine white matter T2 hyperintensity.  Deep gray matter nuclei and cerebellum are within normal limits.  Left sphenoid sinus mucosal thickening.  Other Visualized paranasal sinuses and mastoids are clear.  Visualized orbit soft tissues are within normal limits.  Grossly negative visualized internal auditory structures.  There is a round T2 hyperintense 16 mm right parotid lesion partially visible.  Otherwise negative visualized scalp soft tissues.  IMPRESSION: 1. No acute intracranial abnormality.  See MRA findings below. 2.  Early metastatic disease to the brain cannot be excluded in the absence of intravenous contrast. 3.  Moderate nonspecific white matter signal changes, most commonly due to chronic small vessel disease. 4.  16 mm right parotid lesion partially visible, with signal characteristics compatible with benign etiology.  MRA HEAD WITHOUT CONTRAST  Technique: Angiographic images of the Circle of Willis were obtained using MRA technique without  intravenous contrast.  Findings:  Antegrade flow in the posterior circulation with dominant distal left vertebral artery.  Normal PICA and AICA vessels. Normal basilar artery.  Normal superior cerebellar artery and PCA origins.  Normal posterior right communicating artery, the left is diminutive or absent.  Bilateral PCA branches are within normal limits.  Antegrade flow in both ICA siphons.  Mild ICA irregularity without stenosis.  Ophthalmic artery and right posterior communicating artery origins are within normal limits.  Normal carotid termini, MCA and ACA origins.  Small anterior communicating artery.  Visualized ACA branches are within normal limits.  The right MCA M1 segment is mildly irregular but without stenosis.  Visualized right MCA branches are within normal limits.  There is early left MCA M1 branching.  The superior branch abruptly terminates on series 7 image 74 (series 704 image 7).  Subsequently there is an occluded left M2 branch suspected (series 7 image 83). However, the inferior M1 branch remains patent along with multiple other M2 branches.  IMPRESSION: 1.  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 This finding discussed by telephone with Dr. Merlene Laughter on 02/21/2012 at 2112 hours. 2.  Other left MCA branches are patent.  No more proximal anterior circulation lesion. 3.  Negative posterior circulation.  Original Report Authenticated By: Harley Hallmark, M.D.   Mr Brain Wo Contrast  02/21/2012  *RADIOLOGY  REPORT*  Clinical Data:  72 year old female Code stroke.  Right facial droop and aphasia.  History of breast cancer.  Comparison: Head CT without contrast 02/21/2012.  MRI HEAD WITHOUT CONTRAST  Technique: Multiplanar, multiecho pulse sequences of the brain and surrounding structures were obtained according to standard protocol without intravenous contrast.  Findings: Partially empty sella.  Mildly heterogeneous diffusion imaging, but No restricted diffusion to suggest  acute infarction. Major intracranial vascular flow voids are preserved.  No ventriculomegaly. No midline shift, mass effect, or evidence of mass lesion.  No acute intracranial hemorrhage identified. Negative cervicomedullary junction.  Negative for age cervical spine (degenerative ligamentous hypertrophy about the odontoid. Visualized bone marrow signal is within normal limits.  Moderate for age patchy and confluent cerebral white matter T2 and FLAIR hyperintensity.  Mild to moderate pontine white matter T2 hyperintensity.  Deep gray matter nuclei and cerebellum are within normal limits.  Left sphenoid sinus mucosal thickening.  Other Visualized paranasal sinuses and mastoids are clear.  Visualized orbit soft tissues are within normal limits.  Grossly negative visualized internal auditory structures.  There is a round T2 hyperintense 16 mm right parotid lesion partially visible.  Otherwise negative visualized scalp soft tissues.  IMPRESSION: 1. No acute intracranial abnormality.  See MRA findings below. 2.  Early metastatic disease to the brain cannot be excluded in the absence of intravenous contrast. 3.  Moderate nonspecific white matter signal changes, most commonly due to chronic small vessel disease. 4.  16 mm right parotid lesion partially visible, with signal characteristics compatible with benign etiology.  MRA HEAD WITHOUT CONTRAST  Technique: Angiographic images of the Circle of Willis were obtained using MRA technique without  intravenous contrast.  Findings: Antegrade flow in the posterior circulation with dominant distal left vertebral artery.  Normal PICA and AICA vessels. Normal basilar artery.  Normal superior cerebellar artery and PCA origins.  Normal posterior right communicating artery, the left is diminutive or absent.  Bilateral PCA branches are within normal limits.  Antegrade flow in both ICA siphons.  Mild ICA irregularity without stenosis.  Ophthalmic artery and right posterior communicating  artery origins are within normal limits.  Normal carotid termini, MCA and ACA origins.  Small anterior communicating artery.  Visualized ACA branches are within normal limits.  The right MCA M1 segment is mildly irregular but without stenosis.  Visualized right MCA branches are within normal limits.  There is early left MCA M1 branching.  The superior branch abruptly terminates on series 7 image 74 (series 704 image 7).  Subsequently there is an occluded left M2 branch suspected (series 7 image 83). However, the inferior M1 branch remains patent along with multiple other M2 branches.  IMPRESSION: 1.  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 This finding discussed by telephone with Dr. Merlene Laughter on 02/21/2012 at 2112 hours. 2.  Other left MCA branches are patent.  No more proximal anterior circulation lesion. 3.  Negative posterior circulation.  Original Report Authenticated By: Harley Hallmark, M.D.    Medications: Scheduled Meds:   . atenolol  25 mg Oral BID  . budesonide-formoterol  2 puff Inhalation BID  . buPROPion  150 mg Oral BID  . busPIRone  15 mg Oral BID  . clopidogrel  75 mg Oral Q breakfast  . diazepam      . docusate sodium  100 mg Oral BID  . ferrous sulfate  325 mg Oral Q breakfast  . insulin  aspart  0-9 Units Subcutaneous Q4H  . loratadine  10 mg Oral Daily  . pantoprazole  40 mg Oral Q1200  . simvastatin  20 mg Oral q1800  . DISCONTD: albuterol  2.5 mg Nebulization TID  . DISCONTD: albuterol  2.5 mg Nebulization TID  . DISCONTD: albuterol  2.5 mg Nebulization TID  . DISCONTD: aspirin  325 mg Oral Daily  . DISCONTD: ipratropium  0.5 mg Nebulization TID  . DISCONTD: ipratropium  0.5 mg Nebulization TID  . DISCONTD: ipratropium  0.5 mg Nebulization TID  . DISCONTD: ipratropium-albuterol  3 mL Inhalation TID   Continuous Infusions:   . sodium chloride       Assessment/Plan: Principal Problem:  *Unspecified  transient cerebral ischemia/ TIA - Stroke workup in progress, reviewed neurology recommendations, start on Plavix - 2-D echocardiogram, carotid Dopplers pending, PT evaluation, speech valuation pending - MRI postcontrast views ordered to rule out metastatic disease to brain.  Active Problems:  DM w/o Complication Type II - Obtain hemoglobin A1c, Continue sliding scale insulin   Ovarian cancer - Patient's chemotherapy is on Tuesday   DVT Prophylaxis:  Code Status: Full code  Disposition: Hopefully tomorrow if workup is all complete   LOS: 1 day   Celestino Ackerman M.D. [facility] 02/22/2012, 12:10 PM Pager: [phone]  If 7PM-7AM, please contact night-coverage [email or web address] Password [email or web address]

## 2012-02-22 NOTE — Progress Notes (Signed)
Patient was walking in the hallway with her daughter @ 23; became weak in her gait and speech became slurred then unable to speak; returned patient to bedside; had her lay down; reapplied O2; put her head down. Vitals 97.3 T, 73 P, 100%o2 sat, BP 120/55.  Dr. Thad Ranger notified of symptoms; IV fluid bolus ordered.  Speech is returning; see neuro reassessment flowsheet.

## 2012-02-22 NOTE — Progress Notes (Signed)
Stroke team progress note    History: [name] [name] is an 71 y.o. female with ovarian and breast CA, diabetes, COPD, spoke to her husband at 26 on 02/21/12 and was normal. Patient then spoke to her daughter later and her speech was noted to be abnormal.  The patient's daughter called her father and the patient was found to have difficulty with speech and right facial droop.  EMS was called at that time and the patient was brought in for evaluation.  While in the ED the patient was felt to improve by staff.  She later worsened again and improved again.  The family feels that although she has improved,  She has not returned to baseline.  Initial NIHSS was 1. LSN: 1445 tPA Given: No: Improvement in symptoms  Subjective: No new symptoms; feels well. Speech better. Still diff swallowing. Updated daughters at bedside.  ROS: swallow difficulty with water  Objective:  Allergies: No Known Allergies  Medications:   . aspirin  325 mg Oral Daily  . atenolol  25 mg Oral BID  . budesonide-formoterol  2 puff Inhalation BID  . buPROPion  150 mg Oral BID  . busPIRone  15 mg Oral BID  . diazepam      . docusate sodium  100 mg Oral BID  . ferrous sulfate  325 mg Oral Q breakfast  . insulin aspart  0-9 Units Subcutaneous Q4H  . loratadine  10 mg Oral Daily  . pantoprazole  40 mg Oral Q1200  . simvastatin  20 mg Oral q1800  . DISCONTD: albuterol  2.5 mg Nebulization TID  . DISCONTD: albuterol  2.5 mg Nebulization TID  . DISCONTD: albuterol  2.5 mg Nebulization TID  . DISCONTD: ipratropium  0.5 mg Nebulization TID  . DISCONTD: ipratropium  0.5 mg Nebulization TID  . DISCONTD: ipratropium  0.5 mg Nebulization TID  . DISCONTD: ipratropium-albuterol  3 mL Inhalation TID   Physical Examination: Blood pressure 98/69, pulse 85, temperature 98.3 F (36.8 C), temperature source Oral, resp. rate 20, height 5\' 4"  (1.626 m), weight 56.416 kg (124 lb 6 oz), SpO2 92.00%. Filed Vitals:   02/22/12 0419  02/22/12 0606 02/22/12 0614 02/22/12 0844  BP: 118/70 123/67 123/67 98/69  Pulse: 80 80 80 85  Temp: 97.9 F (36.6 C) 98.2 F (36.8 C) 98.2 F (36.8 C) 98.3 F (36.8 C)  TempSrc: Oral Oral Oral Oral  Resp: 18 20 20 20   Height:   5\' 4"  (1.626 m)   Weight:   56.416 kg (124 lb 6 oz)   SpO2: 99% 96% 96% 92%   GENERAL EXAM: Patient is in no distress  CARDIOVASCULAR: Regular rate and rhythm, no murmurs, no carotid bruits  NEUROLOGIC: MENTAL STATUS: awake, alert, language fluent, comprehension intact, naming intact CRANIAL NERVE: pupils equal and reactive to light, visual fields full to confrontation, extraocular muscles intact, no nystagmus, facial sensation and strength symmetric, uvula midline, shoulder shrug symmetric, tongue midline. MOTOR: normal bulk and tone, full strength in the BUE, BLE SENSORY: normal and symmetric to light touch COORDINATION: finger-nose-finger, fine finger movements normal REFLEXES: deep tendon reflexes present and symmetric GAIT/STATION: BEDREST  Laboratory Studies:  Basic Metabolic Panel: Lab 02/21/12 1656 02/21/12 1641  NA 139 137  K 4.1 4.0  CL 104 101  CO2 -- 23  GLUCOSE 101* 104*  BUN 14 14  CREATININE 0.70 0.60  CALCIUM -- 9.8  MG -- --  PHOS -- --   Liver Function Tests: Lab 02/21/12 1641  AST 16  ALT 14  ALKPHOS 120*  BILITOT 0.1*  PROT 7.4  ALBUMIN 3.8   CBC: Lab 02/21/12 1656 02/21/12 1641  WBC -- 12.9*  NEUTROABS -- 8.8*  HGB 12.2 11.5*  HCT 36.0 34.3*  MCV -- 89.3  PLT -- 347   Cardiac Enzymes: Lab 02/21/12 2300 02/21/12 1642  CKTOTAL 92 --  CKMB 1.9 --  CKMBINDEX -- --  TROPONINI <0.30 <0.30   CBG: Lab 02/22/12 0812 02/22/12 0417 02/22/12 0104 02/21/12 1701  GLUCAP 122* 86 121* 108*   Coagulation Studies: Basename 02/21/12 1641  LABPROT 12.8  INR 0.94   Urinalysis:  Lab 02/21/12 1842  COLORURINE YELLOW  LABSPEC 1.005  PHURINE 6.0  GLUCOSEU NEGATIVE  HGBUR NEGATIVE  BILIRUBINUR NEGATIVE  KETONESUR  NEGATIVE  PROTEINUR NEGATIVE  UROBILINOGEN 0.2  NITRITE NEGATIVE  LEUKOCYTESUR TRACE*   Lipid Panel:    Component Value Date/Time   CHOL 170 02/22/2012 0334   TRIG 165* 02/22/2012 0334   HDL 50 02/22/2012 0334   CHOLHDL 3.4 02/22/2012 0334   VLDL 33 02/22/2012 0334   LDLCALC 87 02/22/2012 0334   HgbA1C:  Lab Results  Component Value Date   HGBA1C 7.5* 10/04/2011   Imaging:  02/21/2012  CT HEAD WITHOUT CONTRAST  Findings: There is mild cerebral and cerebellar atrophy.  There are patchy and confluent areas of decreased attenuation throughout the deep and periventricular white matter of the cerebral hemispheres bilaterally, compatible with chronic microvascular ischemic changes.  These findings are somewhat asymmetric, however, involving the periventricular white matter on the left to a greater extent than that on the right.  No evidence of acute intracranial hemorrhage, definite mass, mass effect, hydrocephalus or abnormal intra or extra-axial fluid collections.  No acute displaced skull fractures are identified.  Visualized paranasal sinuses and mastoids are well pneumatized.  IMPRESSION: 1.  Mild cerebral atrophy with evidence of chronic microvascular ischemic changes in the deep and periventricular white matter of the cerebral hemispheres bilaterally.  These findings are somewhat asymmetric involving the left cerebral white matter to a greater extent than the right.  Some of these regions are very ill-defined, and strictly speaking, areas of acute/subacute cerebral ischemia would be difficult to exclude on the basis of this examination (no priors are available for comparison).  If there is clinical concern for acute ischemia, further evaluation with brain MRI may be warranted.  Original Report Authenticated By: Florencia Reasons, M.D.   02/21/2012  MRI HEAD WITHOUT CONTRAST  Findings: Partially empty sella.  Mildly heterogeneous diffusion imaging, but No restricted diffusion to suggest acute  infarction. Major intracranial vascular flow voids are preserved.  No ventriculomegaly. No midline shift, mass effect, or evidence of mass lesion.  No acute intracranial hemorrhage identified. Negative cervicomedullary junction.  Negative for age cervical spine (degenerative ligamentous hypertrophy about the odontoid. Visualized bone marrow signal is within normal limits.  Moderate for age patchy and confluent cerebral white matter T2 and FLAIR hyperintensity.  Mild to moderate pontine white matter T2 hyperintensity.  Deep gray matter nuclei and cerebellum are within normal limits.  Left sphenoid sinus mucosal thickening.  Other Visualized paranasal sinuses and mastoids are clear.  Visualized orbit soft tissues are within normal limits.  Grossly negative visualized internal auditory structures.  There is a round T2 hyperintense 16 mm right parotid lesion partially visible.  Otherwise negative visualized scalp soft tissues.  IMPRESSION: 1. No acute intracranial abnormality.  See MRA findings below. 2.  Early metastatic disease to the brain  cannot be excluded in the absence of intravenous contrast. 3.  Moderate nonspecific white matter signal changes, most commonly due to chronic small vessel disease. 4.  16 mm right parotid lesion partially visible, with signal characteristics compatible with benign etiology.    02/21/2012 MRA HEAD WITHOUT CONTRAST  Findings: Antegrade flow in the posterior circulation with dominant distal left vertebral artery.  Normal PICA and AICA vessels. Normal basilar artery.  Normal superior cerebellar artery and PCA origins.  Normal posterior right communicating artery, the left is diminutive or absent.  Bilateral PCA branches are within normal limits.  Antegrade flow in both ICA siphons.  Mild ICA irregularity without stenosis.  Ophthalmic artery and right posterior communicating artery origins are within normal limits.  Normal carotid termini, MCA and ACA origins.  Small anterior  communicating artery.  Visualized ACA branches are within normal limits.  The right MCA M1 segment is mildly irregular but without stenosis.  Visualized right MCA branches are within normal limits.  There is early left MCA M1 branching.  The superior branch abruptly terminates on series 7 image 74 (series 704 image 7).  Subsequently there is an occluded left M2 branch suspected (series 7 image 83). However, the inferior M1 branch remains patent along with multiple other M2 branches.  IMPRESSION: 1.  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 This finding discussed by telephone with Dr. Merlene Laughter on 02/21/2012 at 2112 hours. 2.  Other left MCA branches are patent.  No more proximal anterior circulation lesion. 3.  Negative posterior circulation.  Original Report Authenticated By: Harley Hallmark, M.D.   Assessment: 71 y.o. female presenting with fluctuating symptoms of slurred speech, expressive aphasia and right facial droop.  Due to minor and fluctuating symptoms, not felt to be a tPA candidate.  No acute ischemia on MRI brain. MRA shows left M2 occlusion, likely chronic with lack of acute ischemia on MRI. TIA symptoms could be transient failure of collateral vessels.  DDx of TIA: hypercoaguable state from cancer diagnosis, artery to artery embolism, cardioembolic, small vessel disease  Stroke Risk Factors - diabetes mellitus  Plan: 1. PT consult, OT consult, Speech consult 2. TTE, Carotid dopplers 3. Stop aspirin; start plavix 75mg  daily 4. Risk factor modification (diabetes, hyperchol); continue statin 5. Telemetry monitoring 6. I ordered MRI brain (post contrast views only) to rule out metastatic disease to brain; initial MRI brain done without contrast for stroke evaluation  [facility] - Stroke Team Joycelyn Schmid, MD 02/22/2012, 11:31 AM   Please refer to [email or web address] for on-call Stroke MD

## 2012-02-23 DIAGNOSIS — R0989 Other specified symptoms and signs involving the circulatory and respiratory systems: Secondary | ICD-10-CM

## 2012-02-23 DIAGNOSIS — E785 Hyperlipidemia, unspecified: Secondary | ICD-10-CM

## 2012-02-23 LAB — GLUCOSE, CAPILLARY: Glucose-Capillary: 119 mg/dL — ABNORMAL HIGH (ref 70–99)

## 2012-02-23 LAB — BASIC METABOLIC PANEL
Chloride: 105 mEq/L (ref 96–112)
GFR calc Af Amer: >90 mL/min (ref >90)
GFR calc non Af Amer: >90 mL/min (ref >90)
Potassium: 3.5 mEq/L (ref 3.5–5.1)
Sodium: 141 mEq/L (ref 135–145)

## 2012-02-23 LAB — CBC
HCT: 31.2 % — ABNORMAL LOW (ref 36.0–46.0)
Hemoglobin: 10.3 g/dL — ABNORMAL LOW (ref 12.0–15.0)
MCHC: 33 g/dL (ref 30.0–36.0)
RDW: 17.6 % — ABNORMAL HIGH (ref 11.5–15.5)
WBC: 7.9 10*3/uL (ref 4.0–10.5)

## 2012-02-23 NOTE — Progress Notes (Signed)
Patient ID: [name] [name]  female  ZOX:[id number]    DOB: November 26, 1940    DOA: 02/21/2012  PCP: Sonda Primes, MD  Subjective: Events from yesterday evening noted. Patient states that she feels somewhat improved now. She denies any nausea, vomiting, abdominal pain, diarrhea, chest pain or any shortness of breath.    Objective:  Blood pressure 124/66, pulse 83, temperature 98.1 F (36.7 C), temperature source Oral, resp. rate 18, height 5\' 4"  (1.626 m), weight 56.416 kg (124 lb 6 oz), SpO2 91.00%.  Physical Exam: General: Alert and awake, oriented x3, not in any acute distress. HEENT: anicteric sclera, pupils reactive to light and accommodation, EOMI CVS: S1-S2 clear, no murmur rubs or gallops Chest: clear to auscultation bilaterally, no wheezing, rales or rhonchi Abdomen: soft nontender, nondistended, normal bowel sounds, no organomegaly Extremities: no cyanosis, clubbing or edema noted bilaterally   Lab Results: Basic Metabolic Panel:  Lab 02/23/12 8119 02/21/12 1656 02/21/12 1641  NA 141 139 --  K 3.5 4.1 --  CL 105 104 --  CO2 26 -- 23  GLUCOSE 102* 101* --  BUN 9 14 --  CREATININE 0.59 0.70 --  CALCIUM 8.9 -- 9.8  MG -- -- --  PHOS -- -- --   Liver Function Tests:  Lab 02/21/12 1641  AST 16  ALT 14  ALKPHOS 120*  BILITOT 0.1*  PROT 7.4  ALBUMIN 3.8  CBC:  Lab 02/23/12 0630 02/21/12 1656 02/21/12 1641  WBC 7.9 -- 12.9*  NEUTROABS -- -- 8.8*  HGB 10.3* 12.2 --  HCT 31.2* 36.0 --  MCV 89.4 -- 89.3  PLT 272 -- 347   Cardiac Enzymes:  Lab 02/21/12 2300 02/21/12 1642  CKTOTAL 92 --  CKMB 1.9 --  CKMBINDEX -- --  TROPONINI <0.30 <0.30   CBG:  Lab 02/23/12 1146 02/23/12 0807 02/23/12 0539 02/23/12 0206 02/22/12 2102  GLUCAP 134* 95 83 111* 106*     Micro Results: No results found for this or any previous visit (from the past 240 hour(s)).  Studies/Results: Ct Head Wo Contrast  02/21/2012    IMPRESSION: 1.  Mild cerebral atrophy with evidence of  chronic microvascular ischemic changes in the deep and periventricular white matter of the cerebral hemispheres bilaterally.  These findings are somewhat asymmetric involving the left cerebral white matter to a greater extent than the right.  Some of these regions are very ill-defined, and strictly speaking, areas of acute/subacute cerebral ischemia would be difficult to exclude on the basis of this examination (no priors are available for comparison).  If there is clinical concern for acute ischemia, further evaluation with brain MRI may be warranted.  Original Report Authenticated By: Florencia Reasons, M.D.   Mr [facility] Wo Contrast  02/21/2012    IMPRESSION: 1.  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 This finding discussed by telephone with Dr. Merlene Laughter on 02/21/2012 at 2112 hours. 2.  Other left MCA branches are patent.  No more proximal anterior circulation lesion. 3.  Negative posterior circulation.  Original Report Authenticated By: Harley Hallmark, M.D.   Mr Brain Wo Contrast  02/21/2012   IMPRESSION: 1. No acute intracranial abnormality.  See MRA findings below. 2.  Early metastatic disease to the brain cannot be excluded in the absence of intravenous contrast. 3.  Moderate nonspecific white matter signal changes, most commonly due to chronic small vessel disease. 4.  16 mm right parotid lesion partially visible, with  signal characteristics compatible with benign etiology.    Original Report Authenticated By: Ulla Potash III, M.D.   MRI brain with contrast 02/22/2012 1. Interval development of small acute infarcts in the left sub insular white matter and corona radiata. See brain MRA report 02/21/2012. No mass effect.  2. No abnormal enhancement. No evidence of metastatic disease to the brain.    2-D echocardiogram 02/23/2012 Left ventricle: The estimated ejection fraction was in the range of 55% to 60%. Wall motion was normal;  there were no regional wall motion abnormalities. There was anincreased relative contribution of atrial contraction to ventricular filling, which may be due to aging or hypovolemia. No patent foreman ovale   Carotid duplex completed On 02/22/12 Preliminary report: Bilateral: No evidence of hemodynamically significant internal carotid artery stenosis. Vertebral artery flow is antegrade.    Medications: Scheduled Meds:    . atenolol  25 mg Oral BID  . budesonide-formoterol  2 puff Inhalation BID  . buPROPion  150 mg Oral BID  . busPIRone  15 mg Oral BID  . clopidogrel  75 mg Oral Q breakfast  . docusate sodium  100 mg Oral BID  . ferrous sulfate  325 mg Oral Q breakfast  . insulin aspart  0-9 Units Subcutaneous Q4H  . loratadine  10 mg Oral Daily  . pantoprazole  40 mg Oral Q1200  . simvastatin  20 mg Oral q1800   Continuous Infusions:    . 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 - Neurology following, continue Plavix - 2-D echocardiogram done, EF 55-60%, carotid dopplers done, no significant ICA stenosis  Active Problems:  DM w/o Complication Type II - A1c 7.0, Continue sliding scale insulin   Ovarian cancer - Patient's chemotherapy is on Tuesday  DVT Prophylaxis:  Code Status: Full code  Disposition: In a.m., will arrange home physical therapy, discussed with patient in detail, who is very apprehensive of DC today due to her stroke symptoms last night.   LOS: 2 days   Hawkins Seaman M.D. [facility] 02/23/2012, 1:40 PM Pager: [phone]  If 7PM-7AM, please contact night-coverage [email or web address] Password [email or web address]

## 2012-02-23 NOTE — Progress Notes (Signed)
Stroke team progress note    History: [name] [name] is an 71 y.o. female with ovarian and breast CA, diabetes, COPD, spoke to her husband at 20 on 02/21/12 and was normal. Patient then spoke to her daughter later and her speech was noted to be abnormal.  The patient's daughter called her father and the patient was found to have difficulty with speech and right facial droop.  EMS was called at that time and the patient was brought in for evaluation.  While in the ED the patient was felt to improve by staff.  She later worsened again and improved again.  The family feels that although she has improved,  She has not returned to baseline.  Initial NIHSS was 1. LSN: 1445 tPA Given: No: Improvement in symptoms  Subjective: Had repeat MRI brain (w) + DWI views, now showing patchy acute infarcts in the left subinsular white matter and corona radiata. D/w patient. No family at bedside.  Objective:  Allergies: No Known Allergies  Medications:    . atenolol  25 mg Oral BID  . budesonide-formoterol  2 puff Inhalation BID  . buPROPion  150 mg Oral BID  . busPIRone  15 mg Oral BID  . clopidogrel  75 mg Oral Q breakfast  . docusate sodium  100 mg Oral BID  . ferrous sulfate  325 mg Oral Q breakfast  . insulin aspart  0-9 Units Subcutaneous Q4H  . loratadine  10 mg Oral Daily  . pantoprazole  40 mg Oral Q1200  . simvastatin  20 mg Oral q1800  . DISCONTD: aspirin  325 mg Oral Daily   Physical Examination: Blood pressure 124/66, pulse 83, temperature 98.1 F (36.7 C), temperature source Oral, resp. rate 18, height 5\' 4"  (1.626 m), weight 56.416 kg (124 lb 6 oz), SpO2 91.00%. Filed Vitals:   02/22/12 2104 02/23/12 0208 02/23/12 0541 02/23/12 0916  BP: 112/52 115/66 124/66   Pulse: 78 82 83   Temp: 98.2 F (36.8 C) 97.9 F (36.6 C) 98.1 F (36.7 C)   TempSrc: Oral Oral Oral   Resp: 18 20 18    Height:      Weight:      SpO2: 96% 91% 92% 91%   GENERAL EXAM: Patient is in no  distress  CARDIOVASCULAR: Regular rate and rhythm, no murmurs, no carotid bruits  NEUROLOGIC: MENTAL STATUS: awake, alert, language fluent, comprehension intact, naming intact CRANIAL NERVE: pupils equal and reactive to light, visual fields full to confrontation, extraocular muscles intact, no nystagmus, facial sensation and strength symmetric, uvula midline, shoulder shrug symmetric, tongue midline. MOTOR: normal bulk and tone, full strength in the BUE, BLE SENSORY: normal and symmetric to light touch COORDINATION: finger-nose-finger, fine finger movements normal REFLEXES: deep tendon reflexes present and symmetric GAIT/STATION: BEDREST  Laboratory Studies:  Basic Metabolic Panel:  Lab 02/23/12 2956 02/21/12 1656 02/21/12 1641  NA 141 139 137  K 3.5 4.1 4.0  CL 105 104 101  CO2 26 -- 23  GLUCOSE 102* 101* 104*  BUN 9 14 14   CREATININE 0.59 0.70 0.60  CALCIUM 8.9 -- 9.8  MG -- -- --  PHOS -- -- --   Liver Function Tests:  Lab 02/21/12 1641  AST 16  ALT 14  ALKPHOS 120*  BILITOT 0.1*  PROT 7.4  ALBUMIN 3.8   CBC:  Lab 02/23/12 0630 02/21/12 1656 02/21/12 1641  WBC 7.9 -- 12.9*  NEUTROABS -- -- 8.8*  HGB 10.3* 12.2 11.5*  HCT 31.2* 36.0 34.3*  MCV 89.4 -- 89.3  PLT 272 -- 347   Cardiac Enzymes:  Lab 02/21/12 2300 02/21/12 1642  CKTOTAL 92 --  CKMB 1.9 --  CKMBINDEX -- --  TROPONINI <0.30 <0.30   CBG:  Lab 02/23/12 0807 02/23/12 0539 02/23/12 0206 02/22/12 2102 02/22/12 1630  GLUCAP 95 83 111* 106* 136*   Coagulation Studies:  Basename 02/21/12 1641  LABPROT 12.8  INR 0.94   Urinalysis:   Lab 02/21/12 1842  COLORURINE YELLOW  LABSPEC 1.005  PHURINE 6.0  GLUCOSEU NEGATIVE  HGBUR NEGATIVE  BILIRUBINUR NEGATIVE  KETONESUR NEGATIVE  PROTEINUR NEGATIVE  UROBILINOGEN 0.2  NITRITE NEGATIVE  LEUKOCYTESUR TRACE*   Lipid Panel:    Component Value Date/Time   CHOL 170 02/22/2012 0334   TRIG 165* 02/22/2012 0334   HDL 50 02/22/2012 0334   CHOLHDL  3.4 02/22/2012 0334   VLDL 33 02/22/2012 0334   LDLCALC 87 02/22/2012 0334   HgbA1C:  Lab Results  Component Value Date   HGBA1C 7.0* 02/21/2012   Imaging:  02/21/2012  CT HEAD WITHOUT CONTRAST  Findings: There is mild cerebral and cerebellar atrophy.  There are patchy and confluent areas of decreased attenuation throughout the deep and periventricular white matter of the cerebral hemispheres bilaterally, compatible with chronic microvascular ischemic changes.  These findings are somewhat asymmetric, however, involving the periventricular white matter on the left to a greater extent than that on the right.  No evidence of acute intracranial hemorrhage, definite mass, mass effect, hydrocephalus or abnormal intra or extra-axial fluid collections.  No acute displaced skull fractures are identified.  Visualized paranasal sinuses and mastoids are well pneumatized.  IMPRESSION: 1.  Mild cerebral atrophy with evidence of chronic microvascular ischemic changes in the deep and periventricular white matter of the cerebral hemispheres bilaterally.  These findings are somewhat asymmetric involving the left cerebral white matter to a greater extent than the right.  Some of these regions are very ill-defined, and strictly speaking, areas of acute/subacute cerebral ischemia would be difficult to exclude on the basis of this examination (no priors are available for comparison).  If there is clinical concern for acute ischemia, further evaluation with brain MRI may be warranted.  Original Report Authenticated By: Florencia Reasons, M.D.   02/21/2012  MRI HEAD WITHOUT CONTRAST  Findings: Partially empty sella.  Mildly heterogeneous diffusion imaging, but No restricted diffusion to suggest acute infarction. Major intracranial vascular flow voids are preserved.  No ventriculomegaly. No midline shift, mass effect, or evidence of mass lesion.  No acute intracranial hemorrhage identified. Negative cervicomedullary junction.   Negative for age cervical spine (degenerative ligamentous hypertrophy about the odontoid. Visualized bone marrow signal is within normal limits.  Moderate for age patchy and confluent cerebral white matter T2 and FLAIR hyperintensity.  Mild to moderate pontine white matter T2 hyperintensity.  Deep gray matter nuclei and cerebellum are within normal limits.  Left sphenoid sinus mucosal thickening.  Other Visualized paranasal sinuses and mastoids are clear.  Visualized orbit soft tissues are within normal limits.  Grossly negative visualized internal auditory structures.  There is a round T2 hyperintense 16 mm right parotid lesion partially visible.  Otherwise negative visualized scalp soft tissues.  IMPRESSION: 1. No acute intracranial abnormality.  See MRA findings below. 2.  Early metastatic disease to the brain cannot be excluded in the absence of intravenous contrast. 3.  Moderate nonspecific white matter signal changes, most commonly due to chronic small vessel disease. 4.  16 mm right parotid lesion partially  visible, with signal characteristics compatible with benign etiology.    02/21/2012 MRA HEAD WITHOUT CONTRAST  Findings: Antegrade flow in the posterior circulation with dominant distal left vertebral artery.  Normal PICA and AICA vessels. Normal basilar artery.  Normal superior cerebellar artery and PCA origins.  Normal posterior right communicating artery, the left is diminutive or absent.  Bilateral PCA branches are within normal limits.  Antegrade flow in both ICA siphons.  Mild ICA irregularity without stenosis.  Ophthalmic artery and right posterior communicating artery origins are within normal limits.  Normal carotid termini, MCA and ACA origins.  Small anterior communicating artery.  Visualized ACA branches are within normal limits.  The right MCA M1 segment is mildly irregular but without stenosis.  Visualized right MCA branches are within normal limits.  There is early left MCA M1 branching.   The superior branch abruptly terminates on series 7 image 74 (series 704 image 7).  Subsequently there is an occluded left M2 branch suspected (series 7 image 83). However, the inferior M1 branch remains patent along with multiple other M2 branches.  IMPRESSION: 1.  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 This finding discussed by telephone with Dr. Merlene Laughter on 02/21/2012 at 2112 hours. 2.  Other left MCA branches are patent.  No more proximal anterior circulation lesion. 3.  Negative posterior circulation.  Original Report Authenticated By: Harley Hallmark, M.D.   02/22/2012 MRI brain (with) 1. Interval development of small acute infarcts in the left sub insular white matter and corona radiata. See brain MRA report 02/21/2012. No mass effect.  2. No abnormal enhancement. No evidence of metastatic disease to the brain.  02/22/12 carotid u/s - preliminary no evidence of hemodynamically significant internal carotid artery stenosis. Vertebral artery flow is antegrade.    Assessment: 71 y.o. female ovarian and breast CA, diabetes, COPD, presenting with fluctuating symptoms of slurred speech, expressive aphasia and right facial droop.  Due to minor and fluctuating symptoms, not felt to be a tPA candidate.  No acute ischemia on initial MRI brain. MRA showed left M2 occlusion. Follow up MRI brain imaging with contrast on 02/22/12 showed small left subcortical infarcts, and no evidence of CNS metastatic disease.  Dx: left subcortical infarctions (small vessel thrombosis vs. embolic vs. hypercoaguable state from cancer)  Stroke Risk Factors - diabetes mellitus  Plan: 1. F/u TTE, Carotid dopplers results 2. Continue plavix 75mg  daily 3. Risk factor modification (diabetes, hyperchol); continue statin 4. Telemetry monitoring 5. Home PT at discharge  [facility] - Stroke Team Joycelyn Schmid, MD 02/23/2012, 10:56 AM   Please refer to  [email or web address] for on-call Stroke MD

## 2012-02-23 NOTE — Progress Notes (Signed)
  Echocardiogram 2D Echocardiogram has been performed.  [name] [name] 02/23/2012, 11:05 AM

## 2012-02-24 DIAGNOSIS — I6350 Cerebral infarction due to unspecified occlusion or stenosis of unspecified cerebral artery: Principal | ICD-10-CM

## 2012-02-24 DIAGNOSIS — J449 Chronic obstructive pulmonary disease, unspecified: Secondary | ICD-10-CM

## 2012-02-24 DIAGNOSIS — K219 Gastro-esophageal reflux disease without esophagitis: Secondary | ICD-10-CM

## 2012-02-24 LAB — GLUCOSE, CAPILLARY
Glucose-Capillary: 148 mg/dL — ABNORMAL HIGH (ref 70–99)
Glucose-Capillary: 195 mg/dL — ABNORMAL HIGH (ref 70–99)
Glucose-Capillary: 101 mg/dL — ABNORMAL HIGH (ref 70–99)

## 2012-02-24 LAB — BASIC METABOLIC PANEL
CO2: 26 mEq/L (ref 19–32)
GFR calc non Af Amer: >90 mL/min (ref >90)
Glucose, Bld: 107 mg/dL — ABNORMAL HIGH (ref 70–99)
Potassium: 3.5 mEq/L (ref 3.5–5.1)
Sodium: 141 mEq/L (ref 135–145)

## 2012-02-24 MED ORDER — HEPARIN SOD (PORK) LOCK FLUSH 100 UNIT/ML IV SOLN
500.0000 [IU] | INTRAVENOUS | Status: AC | PRN
  Administered 2012-02-24: 500 [IU]

## 2012-02-24 MED ORDER — CLOPIDOGREL BISULFATE 75 MG PO TABS: 75.0000 mg | ORAL_TABLET | Freq: Every day | ORAL | Status: DC

## 2012-02-24 NOTE — Progress Notes (Signed)
Occupational Therapy Treatment Patient Details Name: [name] [name] MRN: [id number] DOB: [birth year] Today's Date: 02/24/2012 Time: 4098-1191 OT Time Calculation (min): 18 min  OT Assessment / Plan / Recommendation Comments on Treatment Session Pt progressing well. Pt's husband works during the week- encouraged pt to have inc assist available during chemo weeks as she feels she is very weak during those times.     Follow Up Recommendations  Supervision - Intermittent;No OT follow up    Barriers to Discharge       Equipment Recommendations  None recommended by OT;Rolling walker with 5" wheels    Recommendations for Other Services    Frequency     Plan Discharge plan remains appropriate    Precautions / Restrictions Precautions Precautions: Fall Precaution Comments: balance appears to be improving Restrictions Weight Bearing Restrictions: No   Pertinent Vitals/Pain Pt with no c/o pain    ADL  Grooming: Performed;Independent Where Assessed - Grooming: Unsupported standing Upper Body Bathing: Simulated;Supervision/safety Where Assessed - Upper Body Bathing: Supported standing (simulated holding onto grab bar) Lower Body Bathing: Simulated;Supervision/safety Where Assessed - Lower Body Bathing: Supported standing (holding to grab bar) Lower Body Dressing: Performed;Independent Where Assessed - Lower Body Dressing: Unsupported sit to stand Toilet Transfer: Simulated;Independent Toilet Transfer Method: Sit to Barista: Regular height toilet Toileting - Clothing Manipulation and Hygiene: Simulated;Independent Where Assessed - Toileting Clothing Manipulation and Hygiene: Standing Tub/Shower Transfer: Engineer, manufacturing Method: Science writer: Grab bars Equipment Used: Gait belt Transfers/Ambulation Related to ADLs: ambulated >56ft with supervision ADL Comments: Pt progressing well. Educated on  energy conservation techniques to use especially during cancer tx weeks. Pt expressed concern as to what caused her stroke- unable to give pt any answers but directed her to ask neurologist to shed more light on this.     OT Diagnosis:    OT Problem List:   OT Treatment Interventions:     OT Goals Miscellaneous OT Goals OT Goal: Miscellaneous Goal #2 - Progress: Progressing toward goals OT Goal: Miscellaneous Goal #3 - Progress: Progressing toward goals  Visit Information  Last OT Received On: 02/24/12 Assistance Needed: +1    Subjective Data      Prior Functioning       Cognition  Overall Cognitive Status: Appears within functional limits for tasks assessed/performed Arousal/Alertness: Awake/alert Orientation Level: Appears intact for tasks assessed Behavior During Session: [facility] for tasks performed Problem Solving: appears [facility]    Mobility Bed Mobility Supine to Sit: 6: Modified independent (Device/Increase time);HOB elevated Sitting - Scoot to Edge of Bed: 6: Modified independent (Device/Increase time) Transfers Sit to Stand: 7: Independent;From bed Stand to Sit: 7: Independent;To bed Details for Transfer Assistance: no LOB noted during this session   Exercises    Balance    End of Session OT - End of Session Equipment Utilized During Treatment: Gait belt Activity Tolerance: Patient tolerated treatment well Patient left: in bed;with call bell/phone within reach  GO     [name] [name] 02/24/2012, 9:07 AM

## 2012-02-24 NOTE — Progress Notes (Signed)
Stroke team progress note    History: [name] [name] is an 71 y.o. female with ovarian and breast CA, diabetes, COPD, spoke to her husband at 40 on 02/21/12 and was normal. Patient then spoke to her daughter later and her speech was noted to be abnormal.  The patient's daughter called her father and the patient was found to have difficulty with speech and right facial droop.  EMS was called at that time and the patient was brought in for evaluation.  While in the ED the patient was felt to improve by staff.  She later worsened again and improved again.  The family feels that although she has improved,  She has not returned to baseline.  Initial NIHSS was 1. LSN: 1445 tPA Given: No: Improvement in symptoms  Subjective: Husband and daughter at bedside. No further speech "spells" since event Sat night.  Objective:  Allergies: No Known Allergies  Medications:   . atenolol  25 mg Oral BID  . budesonide-formoterol  2 puff Inhalation BID  . buPROPion  150 mg Oral BID  . busPIRone  15 mg Oral BID  . clopidogrel  75 mg Oral Q breakfast  . docusate sodium  100 mg Oral BID  . ferrous sulfate  325 mg Oral Q breakfast  . insulin aspart  0-9 Units Subcutaneous Q4H  . loratadine  10 mg Oral Daily  . pantoprazole  40 mg Oral Q1200  . simvastatin  20 mg Oral q1800   Physical Examination: Blood pressure 121/61, pulse 86, temperature 97.8 F (36.6 C), temperature source Oral, resp. rate 18, height 5\' 4"  (1.626 m), weight 56.416 kg (124 lb 6 oz), SpO2 93.00%. Filed Vitals:   02/23/12 1945 02/23/12 2200 02/24/12 0533 02/24/12 1000  BP:  131/66 117/77 121/61  Pulse:  92 80 86  Temp:  97.7 F (36.5 C) 98 F (36.7 C) 97.8 F (36.6 C)  TempSrc:  Oral Oral Oral  Resp:  18 18 18   Height:      Weight:      SpO2: 95% 94% 95% 93%   GENERAL EXAM: Patient is in no distress  CARDIOVASCULAR: Regular rate and rhythm, no murmurs, no carotid bruits  NEUROLOGIC: MENTAL STATUS: awake, alert, language  fluent, comprehension intact, naming intact.Minimal word hesitancy. Decreased Animal Naming 6 only. CRANIAL NERVE: pupils equal and reactive to light, visual fields full to confrontation, extraocular muscles intact, no nystagmus, facial sensation and strength symmetric, uvula midline, shoulder shrug symmetric, tongue midline. MOTOR: normal bulk and tone, full strength in the BUE, BLE except RUE shoulder abduction limited by pain. SENSORY: normal and symmetric to light touch COORDINATION: finger-nose-finger, fine finger movements normal REFLEXES: deep tendon reflexes present and symmetric GAIT/STATION: BEDREST  Laboratory Studies:  Basic Metabolic Panel:  Lab 02/24/12 1610 02/23/12 0630 02/21/12 1656 02/21/12 1641  NA 141 141 139 137  K 3.5 3.5 4.1 4.0  CL 106 105 104 101  CO2 26 26 -- 23  GLUCOSE 107* 102* 101* 104*  BUN 11 9 14 14   CREATININE 0.58 0.59 0.70 0.60  CALCIUM 9.1 8.9 -- 9.8  MG -- -- -- --  PHOS -- -- -- --   Liver Function Tests:  Lab 02/21/12 1641  AST 16  ALT 14  ALKPHOS 120*  BILITOT 0.1*  PROT 7.4  ALBUMIN 3.8   CBC:  Lab 02/23/12 0630 02/21/12 1656 02/21/12 1641  WBC 7.9 -- 12.9*  NEUTROABS -- -- 8.8*  HGB 10.3* 12.2 11.5*  HCT 31.2* 36.0  34.3*  MCV 89.4 -- 89.3  PLT 272 -- 347   Cardiac Enzymes:  Lab 02/21/12 2300 02/21/12 1642  CKTOTAL 92 --  CKMB 1.9 --  CKMBINDEX -- --  TROPONINI <0.30 <0.30   CBG:  Lab 02/24/12 0531 02/24/12 0016 02/23/12 2044 02/23/12 1622 02/23/12 1146  GLUCAP 101* 148* 195* 119* 134*   Coagulation Studies:  Basename 02/21/12 1641  LABPROT 12.8  INR 0.94   Urinalysis:   Lab 02/21/12 1842  COLORURINE YELLOW  LABSPEC 1.005  PHURINE 6.0  GLUCOSEU NEGATIVE  HGBUR NEGATIVE  BILIRUBINUR NEGATIVE  KETONESUR NEGATIVE  PROTEINUR NEGATIVE  UROBILINOGEN 0.2  NITRITE NEGATIVE  LEUKOCYTESUR TRACE*   Lipid Panel:    Component Value Date/Time   CHOL 170 02/22/2012 0334   TRIG 165* 02/22/2012 0334   HDL 50  02/22/2012 0334   CHOLHDL 3.4 02/22/2012 0334   VLDL 33 02/22/2012 0334   LDLCALC 87 02/22/2012 0334   HgbA1C:  Lab Results  Component Value Date   HGBA1C 7.0* 02/21/2012   Imaging:  02/21/2012  CT HEAD WITHOUT CONTRAST  Findings: There is mild cerebral and cerebellar atrophy.  There are patchy and confluent areas of decreased attenuation throughout the deep and periventricular white matter of the cerebral hemispheres bilaterally, compatible with chronic microvascular ischemic changes.  These findings are somewhat asymmetric, however, involving the periventricular white matter on the left to a greater extent than that on the right.  No evidence of acute intracranial hemorrhage, definite mass, mass effect, hydrocephalus or abnormal intra or extra-axial fluid collections.  No acute displaced skull fractures are identified.  Visualized paranasal sinuses and mastoids are well pneumatized.  IMPRESSION: 1.  Mild cerebral atrophy with evidence of chronic microvascular ischemic changes in the deep and periventricular white matter of the cerebral hemispheres bilaterally.  These findings are somewhat asymmetric involving the left cerebral white matter to a greater extent than the right.  Some of these regions are very ill-defined, and strictly speaking, areas of acute/subacute cerebral ischemia would be difficult to exclude on the basis of this examination (no priors are available for comparison).  If there is clinical concern for acute ischemia, further evaluation with brain MRI may be warranted.  Original Report Authenticated By: Florencia Reasons, M.D.   02/21/2012  MRI HEAD WITHOUT CONTRAST  Findings: Partially empty sella.  Mildly heterogeneous diffusion imaging, but No restricted diffusion to suggest acute infarction. Major intracranial vascular flow voids are preserved.  No ventriculomegaly. No midline shift, mass effect, or evidence of mass lesion.  No acute intracranial hemorrhage identified. Negative  cervicomedullary junction.  Negative for age cervical spine (degenerative ligamentous hypertrophy about the odontoid. Visualized bone marrow signal is within normal limits.  Moderate for age patchy and confluent cerebral white matter T2 and FLAIR hyperintensity.  Mild to moderate pontine white matter T2 hyperintensity.  Deep gray matter nuclei and cerebellum are within normal limits.  Left sphenoid sinus mucosal thickening.  Other Visualized paranasal sinuses and mastoids are clear.  Visualized orbit soft tissues are within normal limits.  Grossly negative visualized internal auditory structures.  There is a round T2 hyperintense 16 mm right parotid lesion partially visible.  Otherwise negative visualized scalp soft tissues.  IMPRESSION: 1. No acute intracranial abnormality.  See MRA findings below. 2.  Early metastatic disease to the brain cannot be excluded in the absence of intravenous contrast. 3.  Moderate nonspecific white matter signal changes, most commonly due to chronic small vessel disease. 4.  16 mm right parotid  lesion partially visible, with signal characteristics compatible with benign etiology.    02/21/2012 MRA HEAD WITHOUT CONTRAST  Findings: Antegrade flow in the posterior circulation with dominant distal left vertebral artery.  Normal PICA and AICA vessels. Normal basilar artery.  Normal superior cerebellar artery and PCA origins.  Normal posterior right communicating artery, the left is diminutive or absent.  Bilateral PCA branches are within normal limits.  Antegrade flow in both ICA siphons.  Mild ICA irregularity without stenosis.  Ophthalmic artery and right posterior communicating artery origins are within normal limits.  Normal carotid termini, MCA and ACA origins.  Small anterior communicating artery.  Visualized ACA branches are within normal limits.  The right MCA M1 segment is mildly irregular but without stenosis.  Visualized right MCA branches are within normal limits.  There is  early left MCA M1 branching.  The superior branch abruptly terminates on series 7 image 74 (series 704 image 7).  Subsequently there is an occluded left M2 branch suspected (series 7 image 83). However, the inferior M1 branch remains patent along with multiple other M2 branches.  IMPRESSION: 1.  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 This finding discussed by telephone with Dr. Merlene Laughter on 02/21/2012 at 2112 hours. 2.  Other left MCA branches are patent.  No more proximal anterior circulation lesion. 3.  Negative posterior circulation.  Original Report Authenticated By: Harley Hallmark, M.D.   02/22/2012 MRI brain (with) 1. Interval development of small acute infarcts in the left sub insular white matter and corona radiata. See brain MRA report 02/21/2012. No mass effect.  2. No abnormal enhancement. No evidence of metastatic disease to the brain.  02/22/12 carotid u/s - preliminary no evidence of hemodynamically significant internal carotid artery stenosis. Vertebral artery flow is antegrade.   02/23/2012 2D echo ---  Assessment: 71 y.o. female ovarian and breast CA, diabetes, COPD, presenting with fluctuating symptoms of slurred speech, expressive aphasia and right facial droop.  Due to minor and fluctuating symptoms, not felt to be a tPA candidate.  No acute ischemia on initial MRI brain. MRA showed left M2 occlusion. Follow up MRI brain imaging with contrast on 02/22/12 showed small left subcortical infarcts, and no evidence of CNS metastatic disease.  Dx: left subcortical infarctions (small vessel thrombosis vs. embolic vs. hypercoaguable state from cancer)  Stroke Risk Factors - diabetes mellitus  Plan: 1. F/u 2D echo 2. Continue plavix 75mg  daily 3. Risk factor modification (diabetes, hyperchol); continue statin 4. Telemetry monitoring 5. Home PT at discharge 6. Ok for chemo tomorrow from neuro standpoint if 2D unrevealing 7. Stroke  Service will sign off. Follow up with Dr. Pearlean Brownie in 2 months.  Joaquin Music, ANP-BC, GNP-BC [facility] Pager: [phone] 02/24/2012 11:31 AM  Dr. Delia Heady, Stroke Center Medical Director, has personally reviewed chart, pertinent data, examined the patient and developed the plan of care. Pager:  [phone]

## 2012-02-24 NOTE — Discharge Summary (Signed)
Physician Discharge Summary  Patient ID: [name] [name] MRN: [id number] DOB/AGE: 69-[name]-1942 69 y.o.  Admit date: 02/21/2012 Discharge date: 02/24/2012  Primary Care Physician:  Sonda Primes, MD  Discharge Diagnoses:    .Stroke/small acute infarcts in the left sub-consider white matter and corona radiata  .Speech and language disorder .Ovarian cancer undergoing chemotherapy  .DM w/o Complication Type II COPD History of breast cancer Hypertension Depression  Consults:  Neurology/stroke service  Discharge Medications: Medication List  As of 02/24/2012 12:42 PM   STOP taking these medications         aspirin EC 81 MG tablet         TAKE these medications         acetaminophen 325 MG tablet   Commonly known as: TYLENOL   Take 650 mg by mouth every 6 (six) hours as needed. For pain      atenolol 50 MG tablet   Commonly known as: TENORMIN   Take 25 mg by mouth 2 (two) times daily.      b complex vitamins tablet   Take 1 tablet by mouth daily.      budesonide-formoterol 160-4.5 MCG/ACT inhaler   Commonly known as: SYMBICORT   Inhale 2 puffs into the lungs 2 (two) times daily.      buPROPion 150 MG 12 hr tablet   Commonly known as: WELLBUTRIN SR   Take 150 mg by mouth 2 (two) times daily.      busPIRone 15 MG tablet   Commonly known as: BUSPAR   Take 15 mg by mouth 2 (two) times daily.      cholecalciferol 1000 UNITS tablet   Commonly known as: VITAMIN D   Take 1,000 Units by mouth daily.      clopidogrel 75 MG tablet   Commonly known as: PLAVIX   Take 1 tablet (75 mg total) by mouth daily with breakfast.      COLACE PO   Take 1 each by mouth daily as needed. For constipation  After treatments only      dexamethasone 4 MG tablet   Commonly known as: DECADRON   Take 8 mg by mouth 2 (two) times daily with a meal. Take 2 tablets by mouth two times a day starting the day after chemotherapy for 3 days.      diazepam 5 MG tablet   Commonly known as: VALIUM   Take 5-10 mg by mouth at bedtime as needed. For sleep      ferrous sulfate 325 (65 FE) MG tablet   Take 325 mg by mouth daily with breakfast.      fish oil-omega-3 fatty acids 1000 MG capsule   Take 1 g by mouth daily.      ipratropium-albuterol 0.5-2.5 (3) MG/3ML Soln   Commonly known as: DUONEB   Inhale 3 mLs into the lungs 3 (three) times daily.      albuterol-ipratropium 18-103 MCG/ACT inhaler   Commonly known as: COMBIVENT   Inhale 2 puffs into the lungs every 4 (four) hours as needed. For wheezing      lidocaine-prilocaine cream   Commonly known as: EMLA   Apply 1 application topically as needed. For treatments      Linaclotide 290 MCG Caps   Take 1 each by mouth daily as needed.      loratadine 10 MG tablet   Commonly known as: CLARITIN   Take 10 mg by mouth daily.      lovastatin 40 MG tablet  Commonly known as: MEVACOR   Take 40 mg by mouth daily.      metFORMIN 1000 MG tablet   Commonly known as: GLUCOPHAGE   Take 1 tablet (1,000 mg total) by mouth 2 (two) times daily with a meal.      naproxen sodium 220 MG tablet   Commonly known as: ANAPROX   Take 220 mg by mouth 2 (two) times daily with a meal.      omeprazole 40 MG capsule   Commonly known as: PRILOSEC   Take 1 capsule (40 mg total) by mouth daily.      ondansetron 8 MG tablet   Commonly known as: ZOFRAN   Take 8 mg by mouth every 12 (twelve) hours as needed. For nausea      oxyCODONE-acetaminophen 5-325 MG per tablet   Commonly known as: PERCOCET/ROXICET   Take 1 tablet by mouth every 4 (four) hours as needed. For pain      oxymetazoline 0.05 % nasal spray   Commonly known as: AFRIN   Place 2 sprays into the nose 2 (two) times daily as needed. Allergies        prochlorperazine 10 MG tablet   Commonly known as: COMPAZINE   Take 10 mg by mouth every 6 (six) hours as needed. For nausea/vomiting      ranitidine 150 MG tablet   Commonly known as: ZANTAC   Take 150 mg by mouth 2 (two) times  daily as needed. Heart burn        sertraline 100 MG tablet   Commonly known as: ZOLOFT   Take 100 mg by mouth daily before breakfast.      traZODone 50 MG tablet   Commonly known as: DESYREL   Take 1 tablet (50 mg total) by mouth at bedtime.             Brief H and P: For complete details please refer to admission H and P, but in brief patient is a 71 year old female with multiple medical problems including history of breast cancer, COPD, GERD, diabetes, dysrhythmia, anxiety presented with slurred speech. At 3:45 PM patient had slurred speech lasting for a few minutes that was waxing and waning, patient had no ataxia or double vision. She developed right facial droop that persisted. Patient presented to the ED and neurology consult was called who recommended TIA workup. She was diagnosed with ovarian cancer in March of 2013 and was undergoing chemotherapy.   Hospital Course:  Patient is a 71 year old female who was initially admitted for TIA workup as her symptoms were improving at the time of admission. Patient did not receive TPA as she had improvement in her symptoms at the time of admission. He should symptoms were however waxing and waning and she later to worsen again during admission. Patient had the first MRI on 02/21/2012 which showed no acute intracranial abnormality an MRA showed only left MCA branching with occlusion of the superior branch and downstream M2 segment. Patient however worsened on 02/22/2012 when she became weak in her gait and developed slurred speech again. Patient received IV fluid bolus and her symptoms improved again. Acute CVA with small acute infarcts in the left sub-insular white matter and corona radiata: No metastatic disease on the repeat MRI with contrast. Patient had repeat MRI with contrast this time which showed interval development of small acute infarcts in the left sub-insular white matter and chronic radiata. There was no evidence of metastatic  disease to the brain. Given patient was  on aspirin prior to admission, stroke service recommended placing patient on Plavix only. 2-D echocardiogram was done which showed EF of 55-60%. Carotid Dopplers showed no significant ICA stenosis. Patient will follow up with Dr. Pearlean Brownie in office in 2 months for stroke followup. Lipid panel showed LDL of 87, triglycerides of 165 hemoglobin A1c was 7.0.  DM w/o Complication Type II  A1c 7.0, patient was placed on sliding scale insulin while inpatient  Ovarian cancer: Patient can resume her chemotherapy   Day of Discharge BP 121/61  Pulse 86  Temp 97.8 F (36.6 C) (Oral)  Resp 18  Ht 5\' 4"  (1.626 m)  Wt 56.416 kg (124 lb 6 oz)  BMI 21.35 kg/m2  SpO2 93%  Physical Exam: General: Alert and awake oriented x3 not in any acute distress. HEENT: anicteric sclera, pupils reactive to light and accommodation CVS: S1-S2 clear no murmur rubs or gallops Chest: clear to auscultation bilaterally, no wheezing rales or rhonchi Abdomen: soft nontender, nondistended, normal bowel sounds, no organomegaly Extremities: no cyanosis, clubbing or edema noted bilaterally Neuro: Cranial nerves II-XII intact, no new focal neurological deficits   The results of significant diagnostics from this hospitalization (including imaging, microbiology, ancillary and laboratory) are listed below for reference.    LAB RESULTS: Basic Metabolic Panel:  Lab 02/24/12 1610 02/23/12 0630  NA 141 141  K 3.5 3.5  CL 106 105  CO2 26 26  GLUCOSE 107* 102*  BUN 11 9  CREATININE 0.58 0.59  CALCIUM 9.1 8.9  MG -- --  PHOS -- --   Liver Function Tests:  Lab 02/21/12 1641  AST 16  ALT 14  ALKPHOS 120*  BILITOT 0.1*  PROT 7.4  ALBUMIN 3.8   CBC:  Lab 02/23/12 0630 02/21/12 1656 02/21/12 1641  WBC 7.9 -- 12.9*  NEUTROABS -- -- 8.8*  HGB 10.3* 12.2 --  HCT 31.2* 36.0 --  MCV 89.4 -- --  PLT 272 -- 347   Cardiac Enzymes:  Lab 02/21/12 2300 02/21/12 1642  CKTOTAL 92 --    CKMB 1.9 --  CKMBINDEX -- --  TROPONINI <0.30 <0.30   BNP: No components found with this basename: POCBNP:2 CBG:  Lab 02/24/12 0531 02/24/12 0016  GLUCAP 101* 148*    Significant Diagnostic Studies:  Ct Head Wo Contrast  02/21/2012  IMPRESSION: 1.  Mild cerebral atrophy with evidence of chronic microvascular ischemic changes in the deep and periventricular white matter of the cerebral hemispheres bilaterally.  These findings are somewhat asymmetric involving the left cerebral white matter to a greater extent than the right.  Some of these regions are very ill-defined, and strictly speaking, areas of acute/subacute cerebral ischemia would be difficult to exclude on the basis of this examination (no priors are available for comparison).  If there is clinical concern for acute ischemia, further evaluation with brain MRI may be warranted.  Original Report Authenticated By: Florencia Reasons, M.D.   Mr [facility] Wo Contrast  02/21/2012   IMPRESSION: 1.  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 This finding discussed by telephone with Dr. Merlene Laughter on 02/21/2012 at 2112 hours. 2.  Other left MCA branches are patent.  No more proximal anterior circulation lesion. 3.  Negative posterior circulation.  Original Report Authenticated By: Harley Hallmark, M.D.   Mr Brain Wo Contrast  02/21/2012 IMPRESSION: 1. No acute intracranial abnormality. 2. Early metastatic disease to the brain cannot be excluded in the absence  of intravenous contrast. 3. Moderate nonspecific white matter signal changes, most commonly due to chronic small vessel disease. 4. 16 mm right parotid lesion partially visible, with signal characteristics compatible with benign etiology. Original Report Authenticated By: Ulla Potash III, M.D.   MRI brain with contrast 02/22/2012  1. Interval development of small acute infarcts in the left sub insular white matter and corona radiata. See  brain MRA report 02/21/2012. No mass effect.  2. No abnormal enhancement. No evidence of metastatic disease to the brain.   2-D echocardiogram 02/23/2012  Left ventricle: The estimated ejection fraction was in the range of 55% to 60%. Wall motion was normal; there were no regional wall motion abnormalities. There was anincreased relative contribution of atrial contraction to ventricular filling, which may be due to aging or hypovolemia. No patent foreman ovale   Carotid duplex completed On 02/22/12  Preliminary report: Bilateral: No evidence of hemodynamically significant internal carotid artery stenosis. Vertebral artery flow is antegrade.   Disposition and Follow-up: Discharge Orders    Future Appointments: Provider: Department: Dept Phone: Center:   02/25/2012 10:15 AM Radene Gunning [facility] [phone] None   02/25/2012 10:45 AM Amy Allegra Grana, PA [facility] [phone] None   02/25/2012 11:45 AM [facility] G23 [facility] [phone] None   02/26/2012 1:00 PM [facility] Inj Nurse [facility] [phone] None   03/03/2012 11:15 AM Radene Gunning [facility] [phone] None   03/03/2012 11:45 AM Amy Allegra Grana, PA [facility] [phone] None   03/17/2012 9:45 AM Radene Gunning [facility] [phone] None   03/17/2012 10:15 AM Amy Allegra Grana, PA [facility] [phone] None   03/17/2012 11:15 AM [facility] E17 [facility] [phone] None   03/18/2012 1:00 PM [facility] Inj Nurse [facility] [phone] None   03/25/2012 10:00 AM Radene Gunning [facility] [phone] None   03/25/2012 10:30 AM Lowella Dell, MD [facility] [phone] None   04/07/2012 1:45 PM Tresa Garter, MD [facility] [phone] [facility]   04/23/2012 1:00 PM Laurette Schimke, MD PHD [facility] [phone] None     Future Orders Please Complete By Expires   Diet Carb Modified      Increase activity slowly          DISPOSITION: Home DIET: Heart healthy diet carb  modified ACTIVITY: As tolerated   DISCHARGE FOLLOW-UP Follow-up Information    Follow up with Sonda Primes, MD. Schedule an appointment as soon as possible for a visit in 10 days. (for hospital follow-up)    Contact information:   [address] [facility] [address] [place] [address] [place] [address] [phone]       Follow up with Gates Rigg, MD. Schedule an appointment as soon as possible for a visit in 2 months. (for stroke follow-up)    Contact information:   [address] [facility] [place] [address] [phone]          Time spent on Discharge: 45 minutes Signed:   Phallon Haydu M.D. [facility] 02/24/2012, 12:42 PM Pager: [phone]  If 7PM-7AM, please contact night-coverage [email or web address] Password [email or web address]

## 2012-02-24 NOTE — Progress Notes (Signed)
Physical Therapy Treatment Patient Details Name: [name] [name] MRN: [id number] DOB: 1940/12/05 Today's Date: 02/24/2012 Time: 1478-2956 PT Time Calculation (min): 9 min  PT Assessment / Plan / Recommendation Comments on Treatment Session  [name] [name] is 71 y/o female admitted with fluctuating neurological symptoms including slurred speech and balance deficits found to have small left subcortical infarcts. Today her balance deficits have improved but pt still demonstrating decreased activity tolerance and fatigues easily (reports this is baseline from her chemotherapy and lack of mobility the past few days). Pt will have assistance from husband and children when she goes home and declines need for f/u PT. Did educate pt on the benefits of remaining active during the process of chemotherapy and cancer rehabilitation.     Follow Up Recommendations  Home health PT (pt declining HHPT at this time)    Barriers to Discharge        Equipment Recommendations  None recommended by PT    Recommendations for Other Services    Frequency Min 4X/week   Plan Discharge plan remains appropriate;Frequency remains appropriate    Precautions / Restrictions Precautions Precautions: Fall       Mobility  Bed Mobility Bed Mobility: Not assessed Details for Bed Mobility Assistance: pt sitting upright in chair awaiting w/c for d/c Transfers Transfers: Sit to Stand;Stand to Sit Sit to Stand: 6: Modified independent (Device/Increase time);With armrests;With upper extremity assist Stand to Sit: 6: Modified independent (Device/Increase time);With armrests;With upper extremity assist Ambulation/Gait Ambulation/Gait Assistance: 5: Supervision Ambulation Distance (Feet): 200 Feet Assistive device: None Ambulation/Gait Assistance Details: slower gait secondary to fatigue but no major LOB noted, did stagger slightly with head turns but correcting independently, good ability to change gait velocity when  appropriate Gait Pattern: Within Functional Limits    Exercises      PT Goals Acute Rehab PT Goals PT Goal Formulation: With patient Pt will Ambulate: >150 feet;with modified independence;with least restrictive assistive device PT Goal: Ambulate - Progress: Progressing toward goal Pt will Go Up / Down Stairs: 1-2 stairs;with modified independence;with least restrictive assistive device  Visit Information  Last PT Received On: 02/24/12 Assistance Needed: +1    Subjective Data  Subjective: I don't need PT.    Cognition  Overall Cognitive Status: Appears within functional limits for tasks assessed/performed Arousal/Alertness: Awake/alert Orientation Level: Appears intact for tasks assessed Behavior During Session: [facility] for tasks performed    Balance     End of Session PT - End of Session Equipment Utilized During Treatment: Gait belt Activity Tolerance: Patient tolerated treatment well Patient left: in chair;with call bell/phone within reach;with family/visitor present Nurse Communication: Mobility status   GP     [facility] [name] [name] 02/24/2012, 2:29 PM

## 2012-02-24 NOTE — Care Management Note (Signed)
    Page 1 of 1   02/24/2012     3:05:16 PM   CARE MANAGEMENT NOTE 02/24/2012  Patient:  [name] [name], [name] [name]   Account Number:  [id number]  Date Initiated:  02/22/2012  Documentation initiated by:  Vance Peper  Subjective/Objective Assessment:   PT WAS ADMITTED WITH SLURRED SPEECH AND FACIAL DROOP     Action/Plan:   CM provided patient with private duty Agency list. [facility] state they will be assisting mom at discharge but will keep for futher reference. Week day CM will followup for any HH needs identified.   Anticipated DC Date:  02/24/2012   Anticipated DC Plan:  HOME W HOME HEALTH SERVICES      DC Planning Services  CM consult      Choice offered to / List presented to:             Status of service:  Completed, signed off Medicare Important Message given?   (If response is "NO", the following Medicare IM given date fields will be blank) Date Medicare IM given:   Date Additional Medicare IM given:    Discharge Disposition:  HOME/SELF CARE  Per UR Regulation:  Reviewed for med. necessity/level of care/duration of stay  If discussed at Long Length of Stay Meetings, dates discussed:    Comments:  02/24/12 [name], RN, BSN 1502 PT WAS ADMITTED WITH FACIAL DROOP AND SLURRED SPEECH.  PT WAS DC'D TO HOME WITH SELF/ FAMILY CARE AND HAS REFUSED HH AND A RW.  THEY WERE INSTRUCTED THAT THEY COULD CALL THEIR PCP IF THEY FELT AS IF IT WAS NEEDED ONMCE THEY WERE HOME. INFORMATION WAS GIVNE TO THEM FOR PRIVATE SITTER DUTIES FOR FUTURE USE.

## 2012-02-25 ENCOUNTER — Telehealth: Payer: Self-pay | Admitting: Oncology

## 2012-02-25 ENCOUNTER — Encounter: Payer: Self-pay | Admitting: Physician Assistant

## 2012-02-25 ENCOUNTER — Ambulatory Visit (HOSPITAL_BASED_OUTPATIENT_CLINIC_OR_DEPARTMENT_OTHER): Payer: Medicare Other | Admitting: Physician Assistant

## 2012-02-25 ENCOUNTER — Other Ambulatory Visit (HOSPITAL_BASED_OUTPATIENT_CLINIC_OR_DEPARTMENT_OTHER): Payer: Medicare Other | Admitting: Lab

## 2012-02-25 ENCOUNTER — Ambulatory Visit: Payer: Medicare Other

## 2012-02-25 VITALS — BP 112/71 | HR 84 | Temp 97.9°F | Ht 64.0 in | Wt 124.8 lb

## 2012-02-25 DIAGNOSIS — C569 Malignant neoplasm of unspecified ovary: Secondary | ICD-10-CM

## 2012-02-25 DIAGNOSIS — I6350 Cerebral infarction due to unspecified occlusion or stenosis of unspecified cerebral artery: Secondary | ICD-10-CM

## 2012-02-25 DIAGNOSIS — I639 Cerebral infarction, unspecified: Secondary | ICD-10-CM

## 2012-02-25 LAB — COMPREHENSIVE METABOLIC PANEL
ALT: 17 U/L (ref 0–35)
AST: 16 U/L (ref 0–37)
Albumin: 4 g/dL (ref 3.5–5.2)
Alkaline Phosphatase: 115 U/L (ref 39–117)
BUN: 13 mg/dL (ref 6–23)
Calcium: 10.4 mg/dL (ref 8.4–10.5)
Chloride: 100 mEq/L (ref 96–112)
Potassium: 3.8 mEq/L (ref 3.5–5.3)
Sodium: 137 mEq/L (ref 135–145)
Total Protein: 7.9 g/dL (ref 6.0–8.3)

## 2012-02-25 LAB — CBC WITH DIFFERENTIAL/PLATELET
BASO%: 0.4 % (ref 0.0–2.0)
Basophils Absolute: 0.1 10*3/uL (ref 0.0–0.1)
EOS%: 0.9 % (ref 0.0–7.0)
HGB: 12.1 g/dL (ref 11.6–15.9)
MCH: 30.1 pg (ref 25.1–34.0)
MONO%: 7.9 % (ref 0.0–14.0)
RBC: 4.02 10*6/uL (ref 3.70–5.45)
RDW: 17.4 % — ABNORMAL HIGH (ref 11.2–14.5)
lymph#: 1.7 10*3/uL (ref 0.9–3.3)

## 2012-02-25 NOTE — Telephone Encounter (Signed)
gve the pt's dtr the July,aug appt calendar. Sent michelle a  Staff messge to add the chemo appts.

## 2012-02-25 NOTE — Progress Notes (Signed)
ID: [name] [name]   DOB: [birth year]  MR#: [id number]  CSN#:[id number]  HISTORY OF PRESENT ILLNESS: [name] [name] is a 71 year-old [place] woman I followed remotely for a history of breast cancer. More recently she noted that her belly was getting "bigger". Gradually she has developed increasing low back pain. After a period of several months, as her symptoms increased, she brought this problem to her primary physician's attention and a KUB was obtained on 10/07/2011, which was unremarkable. This was followed by a CT scan 10/15/2011, which showed ascites and omental caking. Paracentesis 10/18/2011 showed ([id number]) malignant cells consistent with metastatic adenocarcinoma, with equivocal staining for estrogen receptor. The pattern of additional stains was most suggestive of a primary gynecologic tumor. CA 125 at this time was 2051.  The patient was referred to gynecologic oncology and on 10/29/2011 underwent exploratory laparotomy under Drs. Cleda Mccreedy and Antionette Char. This consisted of a total abdominal hysterectomy with bilateral salpingo-oophorectomy, omentectomy, and radical tumor debulking. Unfortunately there was significant tumor attached to the diaphragm so the patient was suboptimally debulked. Accordingly a peritoneal catheter was not placed. GYN-ONC recommended 6 cycles of carboplatin/paclitaxel given every 3 weeks, with Neulasta on day 2 for granulocyte support. [name] [name]'s subsequent history is as detailed below.  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 three-week doses of carboplatin/paclitaxel. She is due for day 1 cycle 5 today.  Interval history is remarkable for [name] [name] having recently been hospitalized from 02/21/2012 through 02/24/2012 for a stroke with small acute infarcts. She was taken to the emergency room for aphasia, slurred speech, and right sided facial droop. An  MRI of the head with contrast on 02/22/2012 confirmed small acute infarcts, but fortunately showed no evidence of metastatic disease to the brain. I will also note that an echocardiogram on 02/23/2012 was normal with an EF of 55-60%. [name] [name] feels like she is recovering from the recent hospitalization and, other than being tired, tells me she feels "okay".  REVIEW OF SYSTEMS:  [name] [name] denies any fevers or chills. No rashes or skin changes. No peripheral swelling. No abnormal bleeding. No abnormal headaches, dizziness, seizure activity, change in vision, or slurred speech. She's eating and drinking well and has no nausea or change in bowel habits. No new cough or phlegm production. She does have some shortness of breath with exertion which is not new. No chest pain or palpitations.  A detailed review of systems is otherwise stable and noncontributory.    PAST MEDICAL HISTORY: Past Medical History  Diagnosis Date  . History of breast cancer   . COPD (chronic obstructive pulmonary disease)   . GERD (gastroesophageal reflux disease)   . Diabetes mellitus     type II  . Hx of colonic polyps   . Osteoarthritis   . MVP (mitral valve prolapse)   . Dysrhythmia     pt states runs a little fast   . Shortness of breath     with exertion   . Depression   . Anxiety   . Heart murmur   . Cancer     left breast  . Ovarian cancer     active chemotherapy    PAST SURGICAL HISTORY: Past Surgical History  Procedure Date  . Cesarean section 1980, 1982    X 2   . Laparoscopy     work up for infertility  . Tympanoplasty     left X 2  .  Left breast fibroadenoma removal 1960's  . Breast cyst aspirations   . Left modified radial mastectomy   . Laparotomy 10/29/2011    Procedure: EXPLORATORY LAPAROTOMY;  Surgeon: Rejeana Brock A. Duard Brady, MD;  Location: [facility];  Service: Gynecology;  Laterality: N/A;  . Abdominal hysterectomy 10/29/2011    Procedure: HYSTERECTOMY ABDOMINAL;  Surgeon: Rejeana Brock A. Duard Brady, MD;   Location: [facility];  Service: Gynecology;  Laterality: N/A;  Total abdominal hysterectomy  . Salpingoophorectomy 10/29/2011    Procedure: SALPINGO OOPHERECTOMY;  Surgeon: Rejeana Brock A. Duard Brady, MD;  Location: [facility];  Service: Gynecology;  Laterality: Bilateral;    FAMILY HISTORY Family History  Problem Relation Age of Onset  . Cancer Mother     endometrial/ colon  . Cancer Brother     colon  . COPD Other   . Hypertension Other   . Cancer Maternal Uncle     prostate/bladder/lung  . Cancer Paternal Aunt     unknown abdominal cancer  . Cancer Maternal Grandmother     Salivary gland cancer  . Cancer Paternal Aunt     leukemia   the patient's father died at the age of 33. The patient's mother died at the age of 35 she had a history of colon cancer and endometrial cancer. The patient has no sisters. She has one brother, with a history of colon cancer. There is no history of breast cancer in the family. The patient is scheduled for genetic testing later this month.  GYNECOLOGIC HISTORY: She had menarche age 85. First pregnancy to term was at age 38. She is GX T2. She became menopausal at the time of her chemotherapy for breast cancer. This was age 84. 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 Her husband [name] [name] runs a Psychologist, sport and exercise out of their own home. Daughter [name] [name], 59,  currently lives at home with the patient. There are some issues relating to this daughter that the patient discussed briefly. [place]. [name] [name], 30, is an ED nurse at [employer]. The patient has no grandchildren. She is not a Advice worker.   ADVANCED DIRECTIVES: not in place  HEALTH MAINTENANCE: History  Substance Use Topics  . Smoking status: Current Everyday Smoker -- 1.0 packs/day for 30 years    Types: Cigarettes  . Smokeless tobacco: Never Used   Comment: quit again 2009; restarted 1 ppd 2011  . Alcohol Use: No     Colonoscopy: 2009  PAP:  s/p hysterectomy  Bone density: 2010. "good"  Mammography: 2012 NOV--unremarkable  No Known Allergies  Current Outpatient Prescriptions  Medication Sig Dispense Refill  . acetaminophen (TYLENOL) 325 MG tablet Take 650 mg by mouth every 6 (six) hours as needed. For pain      . albuterol-ipratropium (COMBIVENT) 18-103 MCG/ACT inhaler Inhale 2 puffs into the lungs every 4 (four) hours as needed. For wheezing      . atenolol (TENORMIN) 50 MG tablet Take 25 mg by mouth 2 (two) times daily.      Marland Kitchen b complex vitamins tablet Take 1 tablet by mouth daily.        . budesonide-formoterol (SYMBICORT) 160-4.5 MCG/ACT inhaler Inhale 2 puffs into the lungs 2 (two) times daily.  1 Inhaler  5  . buPROPion (WELLBUTRIN SR) 150 MG 12 hr tablet Take 150 mg by mouth 2 (two) times daily.       . busPIRone (BUSPAR) 15 MG tablet Take 15 mg by mouth 2 (two) times daily.      Marland Kitchen  cholecalciferol (VITAMIN D) 1000 UNITS tablet Take 1,000 Units by mouth daily.        . clopidogrel (PLAVIX) 75 MG tablet Take 1 tablet (75 mg total) by mouth daily with breakfast.  60 tablet  3  . dexamethasone (DECADRON) 4 MG tablet Take 8 mg by mouth 2 (two) times daily with a meal. Take 2 tablets by mouth two times a day starting the day after chemotherapy for 3 days.      . diazepam (VALIUM) 5 MG tablet Take 5-10 mg by mouth at bedtime as needed. For sleep      . Docusate Sodium (COLACE PO) Take 1 each by mouth daily as needed. For constipation  After treatments only      . ferrous sulfate 325 (65 FE) MG tablet Take 325 mg by mouth daily with breakfast.      . fish oil-omega-3 fatty acids 1000 MG capsule Take 1 g by mouth daily.      Marland Kitchen ipratropium-albuterol (DUONEB) 0.5-2.5 (3) MG/3ML SOLN Inhale 3 mLs into the lungs 3 (three) times daily.       Marland Kitchen lidocaine-prilocaine (EMLA) cream Apply 1 application topically as needed. For treatments      . Linaclotide (LINZESS) 290 MCG CAPS Take 1 each by mouth daily as needed.  30 capsule  11  .  loratadine (CLARITIN) 10 MG tablet Take 10 mg by mouth daily.      Marland Kitchen lovastatin (MEVACOR) 40 MG tablet Take 40 mg by mouth daily.      . metFORMIN (GLUCOPHAGE) 1000 MG tablet Take 1 tablet (1,000 mg total) by mouth 2 (two) times daily with a meal.  180 tablet  3  . naproxen sodium (ANAPROX) 220 MG tablet Take 220 mg by mouth 2 (two) times daily with a meal.      . omeprazole (PRILOSEC) 40 MG capsule Take 1 capsule (40 mg total) by mouth daily.  90 capsule  3  . ondansetron (ZOFRAN) 8 MG tablet Take 8 mg by mouth every 12 (twelve) hours as needed. For nausea      . oxyCODONE-acetaminophen (PERCOCET/ROXICET) 5-325 MG per tablet Take 1 tablet by mouth every 4 (four) hours as needed. For pain      . oxymetazoline (AFRIN) 0.05 % nasal spray Place 2 sprays into the nose 2 (two) times daily as needed. Allergies       . prochlorperazine (COMPAZINE) 10 MG tablet Take 10 mg by mouth every 6 (six) hours as needed. For nausea/vomiting      . ranitidine (ZANTAC) 150 MG tablet Take 150 mg by mouth 2 (two) times daily as needed. Heart burn       . sertraline (ZOLOFT) 100 MG tablet Take 100 mg by mouth daily before breakfast.      . traZODone (DESYREL) 50 MG tablet Take 1 tablet (50 mg total) by mouth at bedtime.  30 tablet  4  . DISCONTD: busPIRone (BUSPAR) 15 MG tablet Take 1 tablet (15 mg total) by mouth 2 (two) times daily.  180 tablet  1  . DISCONTD: roflumilast (DALIRESP) 500 MCG TABS tablet Take 500 mcg by mouth daily.         No current facility-administered medications for this visit.   Facility-Administered Medications Ordered in Other Visits  Medication Dose Route Frequency Provider Last Rate Last Dose  . DISCONTD: 0.9 %  sodium chloride infusion   Intravenous Continuous Ripudeep K Rai, MD 75 mL/hr at 02/23/12 0513 1,000 mL at 02/23/12 0513  .  DISCONTD: acetaminophen (TYLENOL) tablet 650 mg  650 mg Oral Q4H PRN Therisa Doyne, MD      . DISCONTD: albuterol-ipratropium (COMBIVENT) inhaler 2 puff   2 puff Inhalation Q4H PRN Therisa Doyne, MD   2 puff at 02/24/12 0858  . DISCONTD: atenolol (TENORMIN) tablet 25 mg  25 mg Oral BID Therisa Doyne, MD   25 mg at 02/24/12 0949  . DISCONTD: budesonide-formoterol (SYMBICORT) 160-4.5 MCG/ACT inhaler 2 puff  2 puff Inhalation BID Therisa Doyne, MD   2 puff at 02/24/12 0858  . DISCONTD: buPROPion (WELLBUTRIN SR) 12 hr tablet 150 mg  150 mg Oral BID Therisa Doyne, MD   150 mg at 02/24/12 0950  . DISCONTD: busPIRone (BUSPAR) tablet 15 mg  15 mg Oral BID Therisa Doyne, MD   15 mg at 02/24/12 0950  . DISCONTD: clopidogrel (PLAVIX) tablet 75 mg  75 mg Oral Q breakfast Ripudeep Jenna Luo, MD   75 mg at 02/24/12 0818  . DISCONTD: diazepam (VALIUM) tablet 5-10 mg  5-10 mg Oral QHS PRN Therisa Doyne, MD   10 mg at 02/21/12 2300  . DISCONTD: docusate sodium (COLACE) capsule 100 mg  100 mg Oral BID Therisa Doyne, MD   100 mg at 02/23/12 1008  . DISCONTD: ferrous sulfate tablet 325 mg  325 mg Oral Q breakfast Therisa Doyne, MD   325 mg at 02/24/12 0949  . DISCONTD: insulin aspart (novoLOG) injection 0-9 Units  0-9 Units Subcutaneous Q4H Therisa Doyne, MD   1 Units at 02/24/12 0000  . DISCONTD: loratadine (CLARITIN) tablet 10 mg  10 mg Oral Daily Therisa Doyne, MD   10 mg at 02/24/12 0949  . DISCONTD: pantoprazole (PROTONIX) EC tablet 40 mg  40 mg Oral Q1200 Therisa Doyne, MD   40 mg at 02/23/12 1207  . DISCONTD: prochlorperazine (COMPAZINE) tablet 10 mg  10 mg Oral Q6H PRN Therisa Doyne, MD      . DISCONTD: simvastatin (ZOCOR) tablet 20 mg  20 mg Oral q1800 Therisa Doyne, MD   20 mg at 02/22/12 2000  . DISCONTD: sodium chloride 0.9 % injection 10-40 mL  10-40 mL Intracatheter PRN Zannie Cove, MD   10 mL at 02/24/12 1230    OBJECTIVE: Middle-aged white woman who appears slightly tired  Filed Vitals:   02/25/12 1118  BP: 112/71  Pulse: 84  Temp: 97.9 F (36.6 C)     Body mass index is 21.42  kg/(m^2).    ECOG FS: 1  Filed Weights   02/25/12 1118  Weight: 124 lb 12.8 oz (56.609 kg)   Physical Exam: HEENT:  Sclerae anicteric, conjunctivae pink.  Oropharynx clear.     Nodes:  No cervical, supraclavicular, or axillary lymphadenopathy palpated.  Breast Exam:  Deferred  Lungs:  Clear to auscultation bilaterally, although breath sounds are slightly diminished by basilar. No crackles or rhonchi auscultated Heart:  Regular rate and rhythm.   Abdomen:  Soft, nontender, nondistended.  Positive bowel sounds.  No organomegaly or masses palpated.   Musculoskeletal:  No focal spinal tenderness to palpation.  Extremities:  Benign.  No peripheral edema or cyanosis.   Skin:  Benign.   Neuro:  Nonfocal. Strength is 5 out of 5 bilaterally in the upper extremities. Cranial nerves are grossly intact. Patient is alert and oriented x3. Speech was clear, not slurred. No facial droop noted.      LAB RESULTS: Lab Results  Component Value Date   WBC 11.3* 02/25/2012   NEUTROABS 8.6* 02/25/2012  HGB 12.1 02/25/2012   HCT 35.5 02/25/2012   MCV 88.3 02/25/2012   PLT 335 02/25/2012      Chemistry      Component Value Date/Time   NA 137 02/25/2012 1110   K 3.8 02/25/2012 1110   CL 100 02/25/2012 1110   CO2 26 02/25/2012 1110   BUN 13 02/25/2012 1110   CREATININE 0.66 02/25/2012 1110      Component Value Date/Time   CALCIUM 10.4 02/25/2012 1110   ALKPHOS 115 02/25/2012 1110   AST 16 02/25/2012 1110   ALT 17 02/25/2012 1110   BILITOT 0.2* 02/25/2012 1110     Results for ADALEE, KATHAN (MRN [id number]) as of 02/11/2012 10:19  Ref. Range 10/16/2011 16:39 11/15/2011 10:40 12/02/2011 15:15 01/14/2012 10:55  CA 125 Latest Range: 0.0-30.2 U/mL 2050.7 (H) 838.5 (H) 496.1 (H) 118.9 (H)    CMET and CA 125 were both repeated today, 02/25/2012, with results pending.  STUDIES:  02/22/2012 *RADIOLOGY REPORT*  Clinical Data: 71 year old female aphasia and weakness. Abnormal  MRI and MRA 1 day earlier.  History  of breast cancer.  MRI HEAD WITH CONTRAST  Technique: Multiplanar, multiecho pulse sequences of the brain and  surrounding structures were obtained according to standard protocol  with intravenous contrast  Contrast: 10mL MULTIHANCE GADOBENATE DIMEGLUMINE 529 MG/ML IV SOLN  Comparison: Brain MRI and MRA 02/21/2012.  Findings: Diffusion imaging was repeated and now reveals a small  foci of restricted diffusion in the left sub insular white matter  and corona radiata out (series 3).  No ventriculomegaly. No mass effect in the brain.  Following contrast, No abnormal enhancement identified.  IMPRESSION:  1. Interval development of small acute infarcts in the left sub  insular white matter and corona radiata. See brain MRA report  02/21/2012. No mass effect.  2. No abnormal enhancement. No evidence of metastatic disease to  the brain.  Original Report Authenticated By: Harley Hallmark, M.D.   ASSESSMENT: 71 y.o.  [place] woman  (1) status post left modified radical mastectomy October 1993 for a T2 N0 invasive ductal breast, estrogen and progesterone receptor positive cancer treated adjuvantly with MFL (methotrexate/fluorouracil/leucovorin) chemotherapy x6, completed April 1994, followed by 5 years of tamoxifen completed April 1999, with no evidence of recurrence  (2) now status post TAH BSO, omentectomy, and suboptimal debulking 10/29/2011 for a high-grade serous adenocarcinoma of the ovary, pT3c NX (Stage IIIC) receiving chemotherapy with a minimum of 6-8  q. three-week doses of carboplatin/paclitaxel, with Neulasta on day 2 for granulocyte support.  The goal is to proceed to at least 2 cycles beyond normalization of CA 125.  (3) COPD with ongoing tobacco abuse  (4) diabetes mellitus  (5)  BRCA 1 and BRCA 2 negative with a "genetic variant of uncertain significance"; additional genetic testing for Lynch syndrome pending.  (6)  status post hospitalization in July 2013 for  stroke/small acute infarcts in the left sub insular white matter and coronal radiata.  PLAN:  This case was reviewed with Dr. Darnelle Catalan, and in light of Jalayia's recent hospitalization, we would like to ere on the side of caution, and delay her fifth cycle of carbo/paclitaxel until next week when she returns on July 30. She would like to have her labs drawn today which will include a CA 125. We will review those results will see her next week.  Again, Dr. Darnelle Catalan would like to do at least 2 cycles of chemotherapy beyond normalization of the CA 125. Certainly if we  get into toxicities we will stop. Whenever we do stop the chemotherapy she will be restaged with a PET scan and CT of the chest abdomen and pelvis to serve for future reference.  [name] [name] voices her understanding and agreement with this plan, and will call with any changes or problems.  [name] [name]    02/25/2012

## 2012-02-26 ENCOUNTER — Ambulatory Visit: Payer: Medicare Other

## 2012-02-26 LAB — GLUCOSE, CAPILLARY: Glucose-Capillary: 164 mg/dL — ABNORMAL HIGH (ref 70–99)

## 2012-02-27 ENCOUNTER — Telehealth: Payer: Self-pay | Admitting: *Deleted

## 2012-02-27 MED FILL — Diazepam Tab 5 MG: ORAL | Qty: 2 | Status: AC

## 2012-02-27 NOTE — Telephone Encounter (Signed)
Per staff message and POF I have scheduled appt.  JMW  

## 2012-03-03 ENCOUNTER — Ambulatory Visit (HOSPITAL_BASED_OUTPATIENT_CLINIC_OR_DEPARTMENT_OTHER): Payer: Medicare Other | Admitting: Physician Assistant

## 2012-03-03 ENCOUNTER — Ambulatory Visit (HOSPITAL_BASED_OUTPATIENT_CLINIC_OR_DEPARTMENT_OTHER): Payer: Medicare Other

## 2012-03-03 ENCOUNTER — Encounter: Payer: Self-pay | Admitting: Physician Assistant

## 2012-03-03 ENCOUNTER — Other Ambulatory Visit (HOSPITAL_BASED_OUTPATIENT_CLINIC_OR_DEPARTMENT_OTHER): Payer: Medicare Other | Admitting: Lab

## 2012-03-03 ENCOUNTER — Telehealth: Payer: Self-pay | Admitting: *Deleted

## 2012-03-03 VITALS — BP 104/67 | HR 80 | Temp 97.5°F | Ht 64.0 in | Wt 122.7 lb

## 2012-03-03 DIAGNOSIS — Z1509 Genetic susceptibility to other malignant neoplasm: Secondary | ICD-10-CM

## 2012-03-03 DIAGNOSIS — Z5111 Encounter for antineoplastic chemotherapy: Secondary | ICD-10-CM

## 2012-03-03 DIAGNOSIS — C569 Malignant neoplasm of unspecified ovary: Secondary | ICD-10-CM

## 2012-03-03 DIAGNOSIS — Z853 Personal history of malignant neoplasm of breast: Secondary | ICD-10-CM

## 2012-03-03 DIAGNOSIS — K5900 Constipation, unspecified: Secondary | ICD-10-CM

## 2012-03-03 DIAGNOSIS — F17200 Nicotine dependence, unspecified, uncomplicated: Secondary | ICD-10-CM

## 2012-03-03 LAB — CBC WITH DIFFERENTIAL/PLATELET
Eosinophils Absolute: 0.2 10*3/uL (ref 0.0–0.5)
HGB: 12.7 g/dL (ref 11.6–15.9)
MCV: 88.7 fL (ref 79.5–101.0)
MONO%: 9.2 % (ref 0.0–14.0)
NEUT#: 5.6 10*3/uL (ref 1.5–6.5)
RBC: 4.17 10*6/uL (ref 3.70–5.45)
RDW: 16.8 % — ABNORMAL HIGH (ref 11.2–14.5)
WBC: 9.4 10*3/uL (ref 3.9–10.3)
lymph#: 2.8 10*3/uL (ref 0.9–3.3)
nRBC: 0 % (ref 0–0)

## 2012-03-03 MED ORDER — HEPARIN SOD (PORK) LOCK FLUSH 100 UNIT/ML IV SOLN
500.0000 [IU] | Freq: Once | INTRAVENOUS | Status: AC | PRN
  Administered 2012-03-03: 500 [IU]
  Filled 2012-03-03: qty 5

## 2012-03-03 MED ORDER — PACLITAXEL CHEMO INJECTION 300 MG/50ML
175.0000 mg/m2 | Freq: Once | INTRAVENOUS | Status: AC
  Administered 2012-03-03: 276 mg via INTRAVENOUS
  Filled 2012-03-03: qty 46

## 2012-03-03 MED ORDER — ONDANSETRON 16 MG/50ML IVPB (CHCC)
16.0000 mg | Freq: Once | INTRAVENOUS | Status: AC
  Administered 2012-03-03: 16 mg via INTRAVENOUS

## 2012-03-03 MED ORDER — DEXAMETHASONE SODIUM PHOSPHATE 4 MG/ML IJ SOLN
20.0000 mg | Freq: Once | INTRAMUSCULAR | Status: AC
  Administered 2012-03-03: 20 mg via INTRAVENOUS

## 2012-03-03 MED ORDER — SODIUM CHLORIDE 0.9 % IJ SOLN
10.0000 mL | INTRAMUSCULAR | Status: DC | PRN
  Administered 2012-03-03: 10 mL
  Filled 2012-03-03: qty 10

## 2012-03-03 MED ORDER — SODIUM CHLORIDE 0.9 % IV SOLN
500.0000 mg | Freq: Once | INTRAVENOUS | Status: AC
  Administered 2012-03-03: 500 mg via INTRAVENOUS
  Filled 2012-03-03: qty 50

## 2012-03-03 MED ORDER — DIPHENHYDRAMINE HCL 50 MG/ML IJ SOLN
25.0000 mg | Freq: Once | INTRAMUSCULAR | Status: AC
  Administered 2012-03-03: 25 mg via INTRAVENOUS

## 2012-03-03 MED ORDER — SODIUM CHLORIDE 0.9 % IV SOLN
Freq: Once | INTRAVENOUS | Status: AC
  Administered 2012-03-03: 13:00:00 via INTRAVENOUS

## 2012-03-03 MED ORDER — FAMOTIDINE IN NACL 20-0.9 MG/50ML-% IV SOLN
20.0000 mg | Freq: Once | INTRAVENOUS | Status: AC
  Administered 2012-03-03: 20 mg via INTRAVENOUS

## 2012-03-03 NOTE — Progress Notes (Signed)
ID: GLORIS SHIROMA   DOB: 12-Jul-1941  MR#: [id number]  CSN#:[id number]  HISTORY OF PRESENT ILLNESS: Chrysten is a 71 year-old [place] woman I followed remotely for a history of breast cancer. More recently she noted that her belly was getting "bigger". Gradually she has developed increasing low back pain. After a period of several months, as her symptoms increased, she brought this problem to her primary physician's attention and a KUB was obtained on 10/07/2011, which was unremarkable. This was followed by a CT scan 10/15/2011, which showed ascites and omental caking. Paracentesis 10/18/2011 showed ([id number]) malignant cells consistent with metastatic adenocarcinoma, with equivocal staining for estrogen receptor. The pattern of additional stains was most suggestive of a primary gynecologic tumor. CA 125 at this time was 2051.  The patient was referred to gynecologic oncology and on 10/29/2011 underwent exploratory laparotomy under Drs. Cleda Mccreedy and Antionette Char. This consisted of a total abdominal hysterectomy with bilateral salpingo-oophorectomy, omentectomy, and radical tumor debulking. Unfortunately there was significant tumor attached to the diaphragm so the patient was suboptimally debulked. Accordingly a peritoneal catheter was not placed. GYN-ONC recommended 6 cycles of carboplatin/paclitaxel given every 3 weeks, with Neulasta on day 2 for granulocyte support. Mayleen's subsequent history is as detailed below.  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 three-week doses of carboplatin/paclitaxel. She is due for day 1 cycle 5 today. Treatment was delayed x1 week due to recent hospitalization for stroke with small acute infarcts. Randa Evens a feels that she is recovering well, but is very nervous that "it will happen again". She continues on Plavix. She's had no abnormal bleeding but does bruise easily. She's had no  abnormal headaches, dizziness, change in vision, slurred speech, or any other signs of a stroke.  Dannette Barbara [place] 125 continues to go down significantly, and she is anxious for her to normalize. She's feeling a little "down and out" today and is "ready for this treatment to be finished".   REVIEW OF SYSTEMS:  Aayushi denies any fevers or chills. No rashes or skin changes. Her energy level is fair. No peripheral swelling or signs of peripheral neuropathy. She's eating and drinking well and has no nausea or change in bowel habits. No new cough or phlegm production. She does have some shortness of breath with exertion which is not new. She continues to smoke approximately half a pack of cigarettes daily, and we discussed smoking cessation. No chest pain or palpitations.  A detailed review of systems is otherwise stable and noncontributory.    PAST MEDICAL HISTORY: Past Medical History  Diagnosis Date  . History of breast cancer   . COPD (chronic obstructive pulmonary disease)   . GERD (gastroesophageal reflux disease)   . Diabetes mellitus     type II  . Hx of colonic polyps   . Osteoarthritis   . MVP (mitral valve prolapse)   . Dysrhythmia     pt states runs a little fast   . Shortness of breath     with exertion   . Depression   . Anxiety   . Heart murmur   . Cancer     left breast  . Ovarian cancer     active chemotherapy    PAST SURGICAL HISTORY: Past Surgical History  Procedure Date  . Cesarean section 1980, 1982    X 2   . Laparoscopy     work up for infertility  . Tympanoplasty  left X 2  . Left breast fibroadenoma removal 1960's  . Breast cyst aspirations   . Left modified radial mastectomy   . Laparotomy 10/29/2011    Procedure: EXPLORATORY LAPAROTOMY;  Surgeon: Rejeana Brock A. Duard Brady, MD;  Location: [facility];  Service: Gynecology;  Laterality: N/A;  . Abdominal hysterectomy 10/29/2011    Procedure: HYSTERECTOMY ABDOMINAL;  Surgeon: Rejeana Brock A. Duard Brady, MD;  Location: [facility];   Service: Gynecology;  Laterality: N/A;  Total abdominal hysterectomy  . Salpingoophorectomy 10/29/2011    Procedure: SALPINGO OOPHERECTOMY;  Surgeon: Rejeana Brock A. Duard Brady, MD;  Location: [facility];  Service: Gynecology;  Laterality: Bilateral;    FAMILY HISTORY Family History  Problem Relation Age of Onset  . Cancer Mother     endometrial/ colon  . Cancer Brother     colon  . COPD Other   . Hypertension Other   . Cancer Maternal Uncle     prostate/bladder/lung  . Cancer Paternal Aunt     unknown abdominal cancer  . Cancer Maternal Grandmother     Salivary gland cancer  . Cancer Paternal Aunt     leukemia   the patient's father died at the age of 83. The patient's mother died at the age of 39 she had a history of colon cancer and endometrial cancer. The patient has no sisters. She has one brother, with a history of colon cancer. There is no history of breast cancer in the family. The patient is scheduled for genetic testing later this month.  GYNECOLOGIC HISTORY: She had menarche age 68. First pregnancy to term was at age 38. She is GX T2. She became menopausal at the time of her chemotherapy for breast cancer. This was age 22. 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 Her husband [name] runs a Psychologist, sport and exercise out of their own home. Daughter [name], 47,  currently lives at home with the patient. There are some issues relating to this daughter that the patient discussed briefly. Dauhter. [name], 30, is an ED nurse at [employer]. The patient has no grandchildren. She is not a Advice worker.   ADVANCED DIRECTIVES: not in place  HEALTH MAINTENANCE: History  Substance Use Topics  . Smoking status: Current Everyday Smoker -- 0.5 packs/day for 30 years    Types: Cigarettes  . Smokeless tobacco: Never Used   Comment: quit again 2009; restarted 1 ppd 2011  . Alcohol Use: No     Colonoscopy: 2009  PAP: s/p  hysterectomy  Bone density: 2010. "good"  Mammography: 2012 NOV--unremarkable  No Known Allergies  Current Outpatient Prescriptions  Medication Sig Dispense Refill  . acetaminophen (TYLENOL) 325 MG tablet Take 650 mg by mouth every 6 (six) hours as needed. For pain      . albuterol-ipratropium (COMBIVENT) 18-103 MCG/ACT inhaler Inhale 2 puffs into the lungs every 4 (four) hours as needed. For wheezing      . atenolol (TENORMIN) 50 MG tablet Take 25 mg by mouth 2 (two) times daily.      Marland Kitchen b complex vitamins tablet Take 1 tablet by mouth daily.        . budesonide-formoterol (SYMBICORT) 160-4.5 MCG/ACT inhaler Inhale 2 puffs into the lungs 2 (two) times daily.  1 Inhaler  5  . buPROPion (WELLBUTRIN SR) 150 MG 12 hr tablet Take 150 mg by mouth 2 (two) times daily.       . busPIRone (BUSPAR) 15 MG tablet Take 15 mg by mouth  2 (two) times daily.      . cholecalciferol (VITAMIN D) 1000 UNITS tablet Take 1,000 Units by mouth daily.        . clopidogrel (PLAVIX) 75 MG tablet Take 1 tablet (75 mg total) by mouth daily with breakfast.  60 tablet  3  . dexamethasone (DECADRON) 4 MG tablet Take 8 mg by mouth 2 (two) times daily with a meal. Take 2 tablets by mouth two times a day starting the day after chemotherapy for 3 days.      . diazepam (VALIUM) 5 MG tablet Take 5-10 mg by mouth at bedtime as needed. For sleep      . Docusate Sodium (COLACE PO) Take 1 each by mouth daily as needed. For constipation  After treatments only      . ferrous sulfate 325 (65 FE) MG tablet Take 325 mg by mouth daily with breakfast.      . fish oil-omega-3 fatty acids 1000 MG capsule Take 1 g by mouth daily.      Marland Kitchen ipratropium-albuterol (DUONEB) 0.5-2.5 (3) MG/3ML SOLN Inhale 3 mLs into the lungs 3 (three) times daily.       Marland Kitchen lidocaine-prilocaine (EMLA) cream Apply 1 application topically as needed. For treatments      . Linaclotide (LINZESS) 290 MCG CAPS Take 1 each by mouth daily as needed.  30 capsule  11  . loratadine  (CLARITIN) 10 MG tablet Take 10 mg by mouth daily.      Marland Kitchen lovastatin (MEVACOR) 40 MG tablet Take 40 mg by mouth daily.      . metFORMIN (GLUCOPHAGE) 1000 MG tablet Take 1 tablet (1,000 mg total) by mouth 2 (two) times daily with a meal.  180 tablet  3  . naproxen sodium (ANAPROX) 220 MG tablet Take 220 mg by mouth 2 (two) times daily with a meal.      . omeprazole (PRILOSEC) 40 MG capsule Take 1 capsule (40 mg total) by mouth daily.  90 capsule  3  . ondansetron (ZOFRAN) 8 MG tablet Take 8 mg by mouth every 12 (twelve) hours as needed. For nausea      . oxyCODONE-acetaminophen (PERCOCET/ROXICET) 5-325 MG per tablet Take 1 tablet by mouth every 4 (four) hours as needed. For pain      . oxymetazoline (AFRIN) 0.05 % nasal spray Place 2 sprays into the nose 2 (two) times daily as needed. Allergies       . prochlorperazine (COMPAZINE) 10 MG tablet Take 10 mg by mouth every 6 (six) hours as needed. For nausea/vomiting      . ranitidine (ZANTAC) 150 MG tablet Take 150 mg by mouth 2 (two) times daily as needed. Heart burn       . sertraline (ZOLOFT) 100 MG tablet Take 100 mg by mouth daily before breakfast.      . traZODone (DESYREL) 50 MG tablet Take 1 tablet (50 mg total) by mouth at bedtime.  30 tablet  4  . DISCONTD: busPIRone (BUSPAR) 15 MG tablet Take 1 tablet (15 mg total) by mouth 2 (two) times daily.  180 tablet  1  . DISCONTD: roflumilast (DALIRESP) 500 MCG TABS tablet Take 500 mcg by mouth daily.          OBJECTIVE: Middle-aged white woman who appears slightly tired  Filed Vitals:   03/03/12 1140  BP: 104/67  Pulse: 80  Temp: 97.5 F (36.4 C)     Body mass index is 21.06 kg/(m^2).    ECOG FS:  1  Filed Weights   03/03/12 1140  Weight: 122 lb 11.2 oz (55.656 kg)   Physical Exam: HEENT:  Sclerae anicteric, conjunctivae pink.  Oropharynx clear.     Nodes:  No cervical, supraclavicular, or axillary lymphadenopathy palpated.  Breast Exam:  Deferred  Lungs:  Clear to auscultation  bilaterally, although breath sounds are slightly diminished by basilar. No crackles or rhonchi auscultated Heart:  Regular rate and rhythm.   Abdomen:  Soft, nontender, nondistended.   No guarding or rebound tenderness. Positive bowel sounds.  No organomegaly or masses palpated.   Musculoskeletal:  No focal spinal tenderness to palpation.  Extremities:  Benign.  No peripheral edema or cyanosis.   Skin:  Benign.   Neuro:  Nonfocal. Cranial nerves are grossly intact. Patient is alert and oriented x3. Speech was clear, not slurred. No facial droop noted.      LAB RESULTS: Lab Results  Component Value Date   WBC 9.4 03/03/2012   NEUTROABS 5.6 03/03/2012   HGB 12.7 03/03/2012   HCT 37.0 03/03/2012   MCV 88.7 03/03/2012   PLT 354 03/03/2012      Chemistry      Component Value Date/Time   NA 137 02/25/2012 1110   K 3.8 02/25/2012 1110   CL 100 02/25/2012 1110   CO2 26 02/25/2012 1110   BUN 13 02/25/2012 1110   CREATININE 0.66 02/25/2012 1110      Component Value Date/Time   CALCIUM 10.4 02/25/2012 1110   ALKPHOS 115 02/25/2012 1110   AST 16 02/25/2012 1110   ALT 17 02/25/2012 1110   BILITOT 0.2* 02/25/2012 1110     Results for DEBBERA, WOLKEN (MRN [id number]) as of 02/11/2012 10:19  Ref. Range 10/16/2011 16:39 11/15/2011 10:40 12/02/2011 15:15 01/14/2012 10:55  CA 125 Latest Range: 0.0-30.2 U/mL 2050.7 (H) 838.5 (H) 496.1 (H) 118.9 (H)  Most recent CA125 was down to 46.9 on 02/25/2012.  STUDIES:  02/22/2012 *RADIOLOGY REPORT*  Clinical Data: 71 year old female aphasia and weakness. Abnormal  MRI and MRA 1 day earlier.  History of breast cancer.  MRI HEAD WITH CONTRAST  Technique: Multiplanar, multiecho pulse sequences of the brain and  surrounding structures were obtained according to standard protocol  with intravenous contrast  Contrast: 10mL MULTIHANCE GADOBENATE DIMEGLUMINE 529 MG/ML IV SOLN  Comparison: Brain MRI and MRA 02/21/2012.  Findings: Diffusion imaging was repeated and now  reveals a small  foci of restricted diffusion in the left sub insular white matter  and corona radiata out (series 3).  No ventriculomegaly. No mass effect in the brain.  Following contrast, No abnormal enhancement identified.  IMPRESSION:  1. Interval development of small acute infarcts in the left sub  insular white matter and corona radiata. See brain MRA report  02/21/2012. No mass effect.  2. No abnormal enhancement. No evidence of metastatic disease to  the brain.  Original Report Authenticated By: Harley Hallmark, M.D.   ASSESSMENT: 71 y.o.  [place] woman  (1) status post left modified radical mastectomy October 1993 for a T2 N0 invasive ductal breast, estrogen and progesterone receptor positive cancer treated adjuvantly with MFL (methotrexate/fluorouracil/leucovorin) chemotherapy x6, completed April 1994, followed by 5 years of tamoxifen completed April 1999, with no evidence of recurrence  (2) now status post TAH BSO, omentectomy, and suboptimal debulking 10/29/2011 for a high-grade serous adenocarcinoma of the ovary, pT3c NX (Stage IIIC) receiving chemotherapy with a minimum of 6-8  q. three-week doses of carboplatin/paclitaxel, with Neulasta on day 2 for  granulocyte support.  The goal is to proceed to at least 2 cycles beyond normalization of CA 125.  (3) COPD with ongoing tobacco abuse  (4) diabetes mellitus  (5)  BRCA 1 and BRCA 2 negative with a "genetic variant of uncertain significance"; additional genetic testing for Lynch syndrome pending.  (6)  status post hospitalization in July 2013 for stroke/small acute infarcts in the left sub insular white matter and coronal radiata.  PLAN:  Venda will proceed to treatment today as scheduled for her fifth every 3 week doses of carboplatin/paclitaxel. She'll receive Neulasta tomorrow, and I will see her next week for assessment of chemotoxicity on August 7. We'll continue to follow her tumor markers. Closely, and are  hopeful, based on her previous response, but she will be within normal range by the time she sees Dr. Darnelle Catalan on August 21. At that point, the plan is to do at least 2 cycles of chemotherapy beyond normalization. Whenever we do stop the chemotherapy she will be restaged with a PET scan and CT of the chest abdomen and pelvis to serve for future reference.  Orpah Clinton voices her understanding and agreement with this plan, and will call with any changes or problems.  Jaylah Goodlow    03/03/2012

## 2012-03-03 NOTE — Patient Instructions (Signed)
[facility] Discharge Instructions for Patients Receiving Chemotherapy  Today you received the following chemotherapy agents Carboplatin and Taxol  To help prevent nausea and vomiting after your treatment, we encourage you to take your nausea medication     If you develop nausea and vomiting that is not controlled by your nausea medication, call the clinic. If it is after clinic hours your family physician or the after hours number for the clinic or go to the Emergency Department.   BELOW ARE SYMPTOMS THAT SHOULD BE REPORTED IMMEDIATELY:  *FEVER GREATER THAN 100.5 F  *CHILLS WITH OR WITHOUT FEVER  NAUSEA AND VOMITING THAT IS NOT CONTROLLED WITH YOUR NAUSEA MEDICATION  *UNUSUAL SHORTNESS OF BREATH  *UNUSUAL BRUISING OR BLEEDING  TENDERNESS IN MOUTH AND THROAT WITH OR WITHOUT PRESENCE OF ULCERS  *URINARY PROBLEMS  *BOWEL PROBLEMS  UNUSUAL RASH Items with * indicate a potential emergency and should be followed up as soon as possible.  One of the nurses will contact you 24 hours after your treatment. Please let the nurse know about any problems that you may have experienced. Feel free to call the clinic you have any questions or concerns. The clinic phone number is [phone].   I have been informed and understand all the instructions given to me. I know to contact the clinic, my physician, or go to the Emergency Department if any problems should occur. I do not have any questions at this time, but understand that I may call the clinic during office hours   should I have any questions or need assistance in obtaining follow up care.    __________________________________________  _____________  __________ Signature of Patient or Authorized Representative            Date                   Time    __________________________________________ Nurse's Signature

## 2012-03-03 NOTE — Telephone Encounter (Signed)
Gave patient appointment for 03-31-2012 starting at 10:15am

## 2012-03-04 ENCOUNTER — Encounter: Payer: Self-pay | Admitting: Internal Medicine

## 2012-03-04 ENCOUNTER — Ambulatory Visit (HOSPITAL_BASED_OUTPATIENT_CLINIC_OR_DEPARTMENT_OTHER): Payer: PRIVATE HEALTH INSURANCE

## 2012-03-04 ENCOUNTER — Other Ambulatory Visit: Payer: Self-pay | Admitting: *Deleted

## 2012-03-04 ENCOUNTER — Ambulatory Visit (INDEPENDENT_AMBULATORY_CARE_PROVIDER_SITE_OTHER): Payer: Medicare Other | Admitting: Internal Medicine

## 2012-03-04 VITALS — BP 115/65 | HR 92 | Temp 97.9°F

## 2012-03-04 VITALS — BP 115/65 | HR 92 | Temp 97.6°F | Resp 16 | Wt 124.0 lb

## 2012-03-04 DIAGNOSIS — K219 Gastro-esophageal reflux disease without esophagitis: Secondary | ICD-10-CM

## 2012-03-04 DIAGNOSIS — E119 Type 2 diabetes mellitus without complications: Secondary | ICD-10-CM

## 2012-03-04 DIAGNOSIS — C569 Malignant neoplasm of unspecified ovary: Secondary | ICD-10-CM

## 2012-03-04 DIAGNOSIS — K5900 Constipation, unspecified: Secondary | ICD-10-CM

## 2012-03-04 DIAGNOSIS — I6350 Cerebral infarction due to unspecified occlusion or stenosis of unspecified cerebral artery: Secondary | ICD-10-CM

## 2012-03-04 DIAGNOSIS — I639 Cerebral infarction, unspecified: Secondary | ICD-10-CM

## 2012-03-04 MED ORDER — ATENOLOL 25 MG PO TABS: 25.0000 mg | ORAL_TABLET | Freq: Every day | ORAL | Status: DC

## 2012-03-04 MED ORDER — ATENOLOL 50 MG PO TABS: 25.0000 mg | ORAL_TABLET | Freq: Two times a day (BID) | ORAL | Status: DC

## 2012-03-04 MED ORDER — PEGFILGRASTIM INJECTION 6 MG/0.6ML
6.0000 mg | Freq: Once | SUBCUTANEOUS | Status: AC
  Administered 2012-03-04: 6 mg via SUBCUTANEOUS
  Filled 2012-03-04: qty 0.6

## 2012-03-04 NOTE — Assessment & Plan Note (Signed)
Hydrate well Plavix Reduce Tenormin - may need to d/c if BP is low

## 2012-03-04 NOTE — Assessment & Plan Note (Signed)
On chemo IV q 3 wks

## 2012-03-04 NOTE — Assessment & Plan Note (Signed)
Continue with current prescription therapy as reflected on the Med list.  

## 2012-03-04 NOTE — Assessment & Plan Note (Signed)
Wt Readings from Last 3 Encounters:  03/04/12 124 lb (56.246 kg)  03/03/12 122 lb 11.2 oz (55.656 kg)  02/25/12 124 lb 12.8 oz (56.609 kg)

## 2012-03-04 NOTE — Progress Notes (Signed)
   Subjective:    Patient ID: [name] [name], female    DOB: [birth year], 71 y.o.   MRN: [id number]  HPI  F/u CVA F/u abd tumor and ascitis - s/p debulking No new abd pain  Wt Readings from Last 3 Encounters:  03/04/12 124 lb (56.246 kg)  03/03/12 122 lb 11.2 oz (55.656 kg)  02/25/12 124 lb 12.8 oz (56.609 kg)   BP Readings from Last 3 Encounters:  03/04/12 115/65  03/04/12 115/65  03/03/12 104/67      Review of Systems  Constitutional: Positive for fatigue and unexpected weight change. Negative for chills, activity change and appetite change.  HENT: Negative for congestion, mouth sores and sinus pressure.   Eyes: Negative for visual disturbance.  Respiratory: Positive for shortness of breath. Negative for cough and chest tightness.   Gastrointestinal: Positive for diarrhea and abdominal distention. Negative for nausea and abdominal pain.  Genitourinary: Negative for frequency, difficulty urinating and vaginal pain.  Musculoskeletal: Negative for back pain and gait problem.  Skin: Negative for pallor and rash.  Neurological: Negative for dizziness, tremors, weakness, numbness and headaches.  Psychiatric/Behavioral: Negative for confusion and disturbed wake/sleep cycle.       Objective:   Physical Exam  Constitutional: She appears well-developed. No distress.  HENT:  Head: Normocephalic.  Right Ear: External ear normal.  Left Ear: External ear normal.  Nose: Nose normal.  Mouth/Throat: Oropharynx is clear and moist.  Eyes: Conjunctivae are normal. Pupils are equal, round, and reactive to light. Right eye exhibits no discharge. Left eye exhibits no discharge.  Neck: Normal range of motion. Neck supple. No JVD present. No tracheal deviation present. No thyromegaly present.  Cardiovascular: Normal rate, regular rhythm and normal heart sounds.   Pulmonary/Chest: No stridor. No respiratory distress. She has no wheezes.  Abdominal: Soft. Bowel sounds are normal. She exhibits  distension. She exhibits no mass. There is no tenderness. There is no rebound and no guarding.  Musculoskeletal: She exhibits no edema and no tenderness.  Lymphadenopathy:    She has no cervical adenopathy.  Neurological: She displays normal reflexes. No cranial nerve deficit. She exhibits normal muscle tone. Coordination normal.  Skin: No rash noted. No erythema.  Psychiatric: She has a normal mood and affect. Her behavior is normal. Judgment and thought content normal.   Lab Results  Component Value Date   WBC 9.4 03/03/2012   HGB 12.7 03/03/2012   HCT 37.0 03/03/2012   PLT 354 03/03/2012   GLUCOSE 134* 02/25/2012   CHOL 170 02/22/2012   TRIG 165* 02/22/2012   HDL 50 02/22/2012   LDLDIRECT 108.0 01/16/2010   LDLCALC 87 02/22/2012   ALT 17 02/25/2012   AST 16 02/25/2012   NA 137 02/25/2012   K 3.8 02/25/2012   CL 100 02/25/2012   CREATININE 0.66 02/25/2012   BUN 13 02/25/2012   CO2 26 02/25/2012   TSH 3.30 08/30/2010   INR 0.94 02/21/2012   HGBA1C 7.0* 02/21/2012   MICROALBUR 1.0 08/30/2010   CT  CA125 2050.7  surg report       Assessment & Plan:

## 2012-03-11 ENCOUNTER — Ambulatory Visit (HOSPITAL_BASED_OUTPATIENT_CLINIC_OR_DEPARTMENT_OTHER): Payer: Medicare Other | Admitting: Physician Assistant

## 2012-03-11 ENCOUNTER — Telehealth: Payer: Self-pay | Admitting: Oncology

## 2012-03-11 ENCOUNTER — Other Ambulatory Visit (HOSPITAL_BASED_OUTPATIENT_CLINIC_OR_DEPARTMENT_OTHER): Payer: Medicare Other | Admitting: Lab

## 2012-03-11 ENCOUNTER — Encounter: Payer: Self-pay | Admitting: Physician Assistant

## 2012-03-11 VITALS — BP 115/68 | HR 97 | Temp 98.2°F | Resp 20 | Ht 64.0 in | Wt 124.4 lb

## 2012-03-11 DIAGNOSIS — C569 Malignant neoplasm of unspecified ovary: Secondary | ICD-10-CM

## 2012-03-11 DIAGNOSIS — Z853 Personal history of malignant neoplasm of breast: Secondary | ICD-10-CM

## 2012-03-11 DIAGNOSIS — J449 Chronic obstructive pulmonary disease, unspecified: Secondary | ICD-10-CM

## 2012-03-11 DIAGNOSIS — F411 Generalized anxiety disorder: Secondary | ICD-10-CM

## 2012-03-11 DIAGNOSIS — F419 Anxiety disorder, unspecified: Secondary | ICD-10-CM

## 2012-03-11 LAB — COMPREHENSIVE METABOLIC PANEL
ALT: 46 U/L — ABNORMAL HIGH (ref 0–35)
AST: 32 U/L (ref 0–37)
Alkaline Phosphatase: 204 U/L — ABNORMAL HIGH (ref 39–117)
Potassium: 4.4 mEq/L (ref 3.5–5.3)
Sodium: 138 mEq/L (ref 135–145)
Total Bilirubin: 0.2 mg/dL — ABNORMAL LOW (ref 0.3–1.2)
Total Protein: 7.6 g/dL (ref 6.0–8.3)

## 2012-03-11 LAB — CBC & DIFF AND RETIC
BASO%: 0.3 % (ref 0.0–2.0)
Eosinophils Absolute: 0.3 10*3/uL (ref 0.0–0.5)
HCT: 35.5 % (ref 34.8–46.6)
Immature Retic Fract: 16.5 % — ABNORMAL HIGH (ref 1.60–10.00)
MCHC: 34.4 g/dL (ref 31.5–36.0)
MONO#: 2 10*3/uL — ABNORMAL HIGH (ref 0.1–0.9)
NEUT#: 21.7 10*3/uL — ABNORMAL HIGH (ref 1.5–6.5)
NEUT%: 79.3 % — ABNORMAL HIGH (ref 38.4–76.8)
RBC: 3.98 10*6/uL (ref 3.70–5.45)
Retic %: 0.58 % — ABNORMAL LOW (ref 0.70–2.10)
WBC: 27.3 10*3/uL — ABNORMAL HIGH (ref 3.9–10.3)
lymph#: 3.3 10*3/uL (ref 0.9–3.3)

## 2012-03-11 MED ORDER — DIAZEPAM 5 MG PO TABS: 5.0000 mg | ORAL_TABLET | Freq: Every evening | ORAL | Status: DC | PRN

## 2012-03-11 NOTE — Telephone Encounter (Signed)
gve the pt her aug,sept 2013 appt calendar. Pt is aware that we will add her chemo appts

## 2012-03-11 NOTE — Progress Notes (Signed)
ID: [name] [name]   DOB: 05-22-1941  MR#: [id number]  CSN#:[id number]  HISTORY OF PRESENT ILLNESS: [name] [name] is a 71 year-old [place] woman I followed remotely for a history of breast cancer. More recently she noted that her belly was getting "bigger". Gradually she has developed increasing low back pain. After a period of several months, as her symptoms increased, she brought this problem to her primary physician's attention and a KUB was obtained on 10/07/2011, which was unremarkable. This was followed by a CT scan 10/15/2011, which showed ascites and omental caking. Paracentesis 10/18/2011 showed ([id number]) malignant cells consistent with metastatic adenocarcinoma, with equivocal staining for estrogen receptor. The pattern of additional stains was most suggestive of a primary gynecologic tumor. CA 125 at this time was 2051.  The patient was referred to gynecologic oncology and on 10/29/2011 underwent exploratory laparotomy under Drs. [name] [name] and [name] [name]. This consisted of a total abdominal hysterectomy with bilateral salpingo-oophorectomy, omentectomy, and radical tumor debulking. Unfortunately there was significant tumor attached to the diaphragm so the patient was suboptimally debulked. Accordingly a peritoneal catheter was not placed. GYN-ONC recommended 6 cycles of carboplatin/paclitaxel given every 3 weeks, with Neulasta on day 2 for granulocyte support. [name] [name]'s subsequent history is as detailed below.  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 three-week doses of carboplatin/paclitaxel. She is currently day 9 cycle 5 of treatment. Treatment was delayed x1 week due to recent hospitalization for stroke with small acute infarcts, but [name] [name] denies any additional complications or signs of stroke. She continues on Plavix. She's had no abnormal bleeding. No abnormal headaches, dizziness, change in vision, or  slurred speech. She does feel little forgetful at times, and a little "off balance" but denies any recent falls.[name] [name] admits to feeling a little depressed and tells me "this treatment is getting old". She does, however, stay very busy with her family. She and her husband are leaving later today for a trip to [facility].  She's also getting ready for her daughter [name] [name]'s wedding on September 1 and has bought a new wig.  REVIEW OF SYSTEMS:  [name] [name] denies any fevers or chills. No rashes or skin changes. Her energy level is fair. She feels anxious and occasionally has difficulty sleeping for which she utilizes diazepam appropriately and affectively. No peripheral swelling or signs of peripheral neuropathy. She's eating and drinking well and has no nausea or change in bowel habits. No new cough or phlegm production. She has some shortness of breath with exertion which is not new. She continues to smoke, and we again discussed smoking cessation. No chest pain or palpitations.  A detailed review of systems is otherwise stable and noncontributory.    PAST MEDICAL HISTORY: Past Medical History  Diagnosis Date  . History of breast cancer   . COPD (chronic obstructive pulmonary disease)   . GERD (gastroesophageal reflux disease)   . Diabetes mellitus     type II  . Hx of colonic polyps   . Osteoarthritis   . MVP (mitral valve prolapse)   . Dysrhythmia     pt states runs a little fast   . Shortness of breath     with exertion   . Depression   . Anxiety   . Heart murmur   . Cancer     left breast  . Ovarian cancer     active chemotherapy    PAST SURGICAL HISTORY: Past Surgical  History  Procedure Date  . Cesarean section 1980, 1982    X 2   . Laparoscopy     work up for infertility  . Tympanoplasty     left X 2  . Left breast fibroadenoma removal 1960's  . Breast cyst aspirations   . Left modified radial mastectomy   . Laparotomy 10/29/2011    Procedure:  EXPLORATORY LAPAROTOMY;  Surgeon: [name] [name];  Location: [facility];  Service: Gynecology;  Laterality: N/A;  . Abdominal hysterectomy 10/29/2011    Procedure: HYSTERECTOMY ABDOMINAL;  Surgeon: [name] [name];  Location: [facility];  Service: Gynecology;  Laterality: N/A;  Total abdominal hysterectomy  . Salpingoophorectomy 10/29/2011    Procedure: SALPINGO OOPHERECTOMY;  Surgeon: [name] [name];  Location: [facility];  Service: Gynecology;  Laterality: Bilateral;    FAMILY HISTORY Family History  Problem Relation Age of Onset  . Cancer Mother     endometrial/ colon  . Cancer Brother     colon  . COPD Other   . Hypertension Other   . Cancer Maternal Uncle     prostate/bladder/lung  . Cancer Paternal Aunt     unknown abdominal cancer  . Cancer Maternal Grandmother     Salivary gland cancer  . Cancer Paternal Aunt     leukemia   the patient's father died at the age of 84. The patient's mother died at the age of 3 she had a history of colon cancer and endometrial cancer. The patient has no sisters. She has one brother, with a history of colon cancer. There is no history of breast cancer in the family. The patient is scheduled for genetic testing later this month.  GYNECOLOGIC HISTORY: She had menarche age 24. First pregnancy to term was at age 53. She is GX T2. She became menopausal at the time of her chemotherapy for breast cancer. This was age 29. 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 Her husband [name] [name] runs a Psych[employer], sport and exercise out of their own home. Daughter [name] [name], 75,  currently lives at home with the patient. There are some issues relating to this daughter that the patient discussed briefly. [place]. [name] [name], 30, is an ED nurse at [employer]. The patient has no grandchildren. She is not a Advice worker.   ADVANCED DIRECTIVES: not in place  HEALTH MAINTENANCE: History  Substance Use Topics  .  Smoking status: Current Everyday Smoker -- 0.5 packs/day for 30 years    Types: Cigarettes  . Smokeless tobacco: Never Used   Comment: quit again 2009; restarted 1 ppd 2011  . Alcohol Use: No     Colonoscopy: 2009  PAP: s/p hysterectomy  Bone density: 2010. "good"  Mammography: 2012 NOV--unremarkable  No Known Allergies  Current Outpatient Prescriptions  Medication Sig Dispense Refill  . acetaminophen (TYLENOL) 325 MG tablet Take 650 mg by mouth every 6 (six) hours as needed. For pain      . albuterol-ipratropium (COMBIVENT) 18-103 MCG/ACT inhaler Inhale 2 puffs into the lungs every 4 (four) hours as needed. For wheezing      . atenolol (TENORMIN) 25 MG tablet Take 1 tablet (25 mg total) by mouth daily. 1/2 tab bid  30 tablet  11  . b complex vitamins tablet Take 1 tablet by mouth daily.        . budesonide-formoterol (SYMBICORT) 160-4.5 MCG/ACT inhaler Inhale 2 puffs into the lungs 2 (two) times daily.  1 Inhaler  5  . buPROPion (WELLBUTRIN SR) 150 MG 12 hr tablet Take 150 mg by mouth 2 (two) times daily.       . busPIRone (BUSPAR) 15 MG tablet Take 15 mg by mouth 2 (two) times daily.      . cholecalciferol (VITAMIN D) 1000 UNITS tablet Take 1,000 Units by mouth daily.        . clopidogrel (PLAVIX) 75 MG tablet Take 1 tablet (75 mg total) by mouth daily with breakfast.  60 tablet  3  . dexamethasone (DECADRON) 4 MG tablet Take 8 mg by mouth 2 (two) times daily with a meal. Take 2 tablets by mouth two times a day starting the day after chemotherapy for 3 days.      . diazepam (VALIUM) 5 MG tablet Take 1-2 tablets (5-10 mg total) by mouth at bedtime as needed. For sleep  30 tablet  0  . Docusate Sodium (COLACE PO) Take 1 each by mouth daily as needed. For constipation  After treatments only      . ferrous sulfate 325 (65 FE) MG tablet Take 325 mg by mouth daily with breakfast.      . fish oil-omega-3 fatty acids 1000 MG capsule Take 1 g by mouth daily.      Marland Kitchen ipratropium-albuterol  (DUONEB) 0.5-2.5 (3) MG/3ML SOLN Inhale 3 mLs into the lungs 3 (three) times daily.       Marland Kitchen lidocaine-prilocaine (EMLA) cream Apply 1 application topically as needed. For treatments      . Linaclotide (LINZESS) 290 MCG CAPS Take 1 each by mouth daily as needed.  30 capsule  11  . loratadine (CLARITIN) 10 MG tablet Take 10 mg by mouth daily.      Marland Kitchen lovastatin (MEVACOR) 40 MG tablet Take 40 mg by mouth daily.      . metFORMIN (GLUCOPHAGE) 1000 MG tablet Take 1 tablet (1,000 mg total) by mouth 2 (two) times daily with a meal.  180 tablet  3  . naproxen sodium (ANAPROX) 220 MG tablet Take 220 mg by mouth 2 (two) times daily with a meal.      . omeprazole (PRILOSEC) 40 MG capsule Take 1 capsule (40 mg total) by mouth daily.  90 capsule  3  . ondansetron (ZOFRAN) 8 MG tablet Take 8 mg by mouth every 12 (twelve) hours as needed. For nausea      . oxyCODONE-acetaminophen (PERCOCET/ROXICET) 5-325 MG per tablet Take 1 tablet by mouth every 4 (four) hours as needed. For pain      . oxymetazoline (AFRIN) 0.05 % nasal spray Place 2 sprays into the nose 2 (two) times daily as needed. Allergies       . prochlorperazine (COMPAZINE) 10 MG tablet Take 10 mg by mouth every 6 (six) hours as needed. For nausea/vomiting      . ranitidine (ZANTAC) 150 MG tablet Take 150 mg by mouth 2 (two) times daily as needed. Heart burn       . sertraline (ZOLOFT) 100 MG tablet Take 100 mg by mouth daily before breakfast.      . traZODone (DESYREL) 50 MG tablet Take 1 tablet (50 mg total) by mouth at bedtime.  30 tablet  4  . DISCONTD: busPIRone (BUSPAR) 15 MG tablet Take 1 tablet (15 mg total) by mouth 2 (two) times daily.  180 tablet  1  . DISCONTD: roflumilast (DALIRESP) 500 MCG TABS tablet Take 500 mcg by mouth daily.          OBJECTIVE:  Middle-aged white woman who appears slightly tired  Filed Vitals:   03/11/12 1330  BP: 115/68  Pulse: 97  Temp: 98.2 F (36.8 C)  Resp: 20     Body mass index is 21.35 kg/(m^2).    ECOG  FS: 1  Filed Weights   03/11/12 1330  Weight: 124 lb 6.4 oz (56.427 kg)   Physical Exam: HEENT:  Sclerae anicteric, conjunctivae pink.  Oropharynx clear. No mucositis or candidiasis noted.    Nodes:  No cervical, supraclavicular, or axillary lymphadenopathy palpated.  Breast Exam:  Deferred  Lungs:  Clear to auscultation bilaterally, although breath sounds are slightly diminished by basilar. No crackles or rhonchi auscultated Heart:  Regular rate and rhythm.   Abdomen:  Soft, nontender, nondistended.   No guarding or rebound tenderness. Positive bowel sounds.  No organomegaly or masses palpated.   Musculoskeletal:  No focal spinal tenderness to palpation.  Extremities:  Benign.  No peripheral edema or cyanosis.   Skin:  Benign.   Neuro:  Nonfocal. Cranial nerves are grossly intact. Patient is alert and oriented x3. Speech was clear, not slurred. No facial droop noted.      LAB RESULTS: Lab Results  Component Value Date   WBC 27.3* 03/11/2012   NEUTROABS 21.7* 03/11/2012   HGB 12.2 03/11/2012   HCT 35.5 03/11/2012   MCV 89.2 03/11/2012   PLT 223 03/11/2012      Chemistry      Component Value Date/Time   NA 138 03/11/2012 1318   K 4.4 03/11/2012 1318   CL 100 03/11/2012 1318   CO2 26 03/11/2012 1318   BUN 13 03/11/2012 1318   CREATININE 0.62 03/11/2012 1318      Component Value Date/Time   CALCIUM 10.3 03/11/2012 1318   ALKPHOS 204* 03/11/2012 1318   AST 32 03/11/2012 1318   ALT 46* 03/11/2012 1318   BILITOT 0.2* 03/11/2012 1318     Results for BEREA, MAJKOWSKI (MRN [id number]) as of 02/11/2012 10:19  Ref. Range 10/16/2011 16:39 11/15/2011 10:40 12/02/2011 15:15 01/14/2012 10:55  CA 125 Latest Range: 0.0-30.2 U/mL 2050.7 (H) 838.5 (H) 496.1 (H) 118.9 (H)  Most recent CA125 was down to 46.9 on 02/25/2012.  STUDIES:  02/22/2012 *RADIOLOGY REPORT*  Clinical Data: 71 year old female aphasia and weakness. Abnormal  MRI and MRA 1 day earlier.  History of breast cancer.  MRI HEAD WITH CONTRAST    Technique: Multiplanar, multiecho pulse sequences of the brain and  surrounding structures were obtained according to standard protocol  with intravenous contrast  Contrast: 10mL MULTIHANCE GADOBENATE DIMEGLUMINE 529 MG/ML IV SOLN  Comparison: Brain MRI and MRA 02/21/2012.  Findings: Diffusion imaging was repeated and now reveals a small  foci of restricted diffusion in the left sub insular white matter  and corona radiata out (series 3).  No ventriculomegaly. No mass effect in the brain.  Following contrast, No abnormal enhancement identified.  IMPRESSION:  1. Interval development of small acute infarcts in the left sub  insular white matter and corona radiata. See brain MRA report  02/21/2012. No mass effect.  2. No abnormal enhancement. No evidence of metastatic disease to  the brain.  Original Report Authenticated By: Harley Hallmark, M.D.   ASSESSMENT: 71 y.o.  [place] woman  (1) status post left modified radical mastectomy October 1993 for a T2 N0 invasive ductal breast, estrogen and progesterone receptor positive cancer treated adjuvantly with MFL (methotrexate/fluorouracil/leucovorin) chemotherapy x6, completed April 1994, followed by 5 years of tamoxifen completed April  1999, with no evidence of recurrence  (2) now status post TAH BSO, omentectomy, and suboptimal debulking 10/29/2011 for a high-grade serous adenocarcinoma of the ovary, pT3c NX (Stage IIIC) receiving chemotherapy with a minimum of 6-8  q. three-week doses of carboplatin/paclitaxel, with Neulasta on day 2 for granulocyte support.  The goal is to proceed to at least 2 cycles beyond normalization of CA 125.  (3) COPD with ongoing tobacco abuse  (4) diabetes mellitus  (5)  BRCA 1 and BRCA 2 negative with a "genetic variant of uncertain significance"; additional genetic testing for Lynch syndrome pending.  (6)  status post hospitalization in July 2013 for stroke/small acute infarcts in the left sub insular  white matter and coronal radiata.  PLAN:  Kamaya continues to tolerate treatment well. She return in 2 weeks for followup with Dr. Darnelle Catalan August 21 anticipation of her sixth planned dose of chemotherapy. At that point, the plan is to do at least 2 cycles of chemotherapy beyond normalization, and we continue to follow her CA 125 very closely. Whenever we do stop the chemotherapy she will be restaged with a PET scan and CT of the chest abdomen and pelvis to serve for future reference.  I have refilled Yulisa's diazepam today which she uses at night for anxiety and sleeplessness.  Orpah Clinton voices her understanding and agreement with this plan, and will call with any changes or problems.  Ayyan Sites    03/11/2012

## 2012-03-12 ENCOUNTER — Other Ambulatory Visit: Payer: Self-pay | Admitting: Physician Assistant

## 2012-03-17 ENCOUNTER — Ambulatory Visit: Payer: Medicare Other | Admitting: Physician Assistant

## 2012-03-17 ENCOUNTER — Telehealth: Payer: Self-pay | Admitting: *Deleted

## 2012-03-17 ENCOUNTER — Other Ambulatory Visit: Payer: Medicare Other | Admitting: Lab

## 2012-03-17 ENCOUNTER — Ambulatory Visit: Payer: Medicare Other

## 2012-03-17 NOTE — Telephone Encounter (Signed)
Per POF I have scheduled appts. JMW  

## 2012-03-18 ENCOUNTER — Ambulatory Visit: Payer: Medicare Other

## 2012-03-25 ENCOUNTER — Other Ambulatory Visit (HOSPITAL_BASED_OUTPATIENT_CLINIC_OR_DEPARTMENT_OTHER): Payer: Medicare Other | Admitting: Lab

## 2012-03-25 ENCOUNTER — Ambulatory Visit (HOSPITAL_BASED_OUTPATIENT_CLINIC_OR_DEPARTMENT_OTHER): Payer: Medicare Other | Admitting: Oncology

## 2012-03-25 ENCOUNTER — Telehealth: Payer: Self-pay | Admitting: *Deleted

## 2012-03-25 ENCOUNTER — Ambulatory Visit (HOSPITAL_BASED_OUTPATIENT_CLINIC_OR_DEPARTMENT_OTHER): Payer: Medicare Other

## 2012-03-25 VITALS — BP 104/65 | HR 92 | Temp 98.1°F | Resp 20 | Ht 64.0 in | Wt 126.1 lb

## 2012-03-25 DIAGNOSIS — Z5111 Encounter for antineoplastic chemotherapy: Secondary | ICD-10-CM

## 2012-03-25 DIAGNOSIS — C569 Malignant neoplasm of unspecified ovary: Secondary | ICD-10-CM

## 2012-03-25 DIAGNOSIS — K5900 Constipation, unspecified: Secondary | ICD-10-CM

## 2012-03-25 DIAGNOSIS — E119 Type 2 diabetes mellitus without complications: Secondary | ICD-10-CM

## 2012-03-25 DIAGNOSIS — J449 Chronic obstructive pulmonary disease, unspecified: Secondary | ICD-10-CM

## 2012-03-25 DIAGNOSIS — Z853 Personal history of malignant neoplasm of breast: Secondary | ICD-10-CM

## 2012-03-25 LAB — CBC WITH DIFFERENTIAL/PLATELET
BASO%: 0.4 % (ref 0.0–2.0)
EOS%: 0.9 % (ref 0.0–7.0)
HCT: 34.8 % (ref 34.8–46.6)
LYMPH%: 15.2 % (ref 14.0–49.7)
MCH: 31.7 pg (ref 25.1–34.0)
MCHC: 33.3 g/dL (ref 31.5–36.0)
MCV: 95.2 fL (ref 79.5–101.0)
MONO#: 0.8 10*3/uL (ref 0.1–0.9)
MONO%: 7 % (ref 0.0–14.0)
NEUT%: 76.5 % (ref 38.4–76.8)
Platelets: 321 10*3/uL (ref 145–400)

## 2012-03-25 LAB — COMPREHENSIVE METABOLIC PANEL
AST: 15 U/L (ref 0–37)
BUN: 13 mg/dL (ref 6–23)
Calcium: 9.7 mg/dL (ref 8.4–10.5)
Chloride: 100 mEq/L (ref 96–112)
Creatinine, Ser: 0.64 mg/dL (ref 0.50–1.10)

## 2012-03-25 MED ORDER — PACLITAXEL CHEMO INJECTION 300 MG/50ML
175.0000 mg/m2 | Freq: Once | INTRAVENOUS | Status: AC
  Administered 2012-03-25: 276 mg via INTRAVENOUS
  Filled 2012-03-25: qty 46

## 2012-03-25 MED ORDER — SODIUM CHLORIDE 0.9 % IV SOLN
500.0000 mg | Freq: Once | INTRAVENOUS | Status: AC
  Administered 2012-03-25: 500 mg via INTRAVENOUS
  Filled 2012-03-25: qty 50

## 2012-03-25 MED ORDER — ONDANSETRON 16 MG/50ML IVPB (CHCC)
16.0000 mg | Freq: Once | INTRAVENOUS | Status: AC
  Administered 2012-03-25: 16 mg via INTRAVENOUS

## 2012-03-25 MED ORDER — DEXAMETHASONE SODIUM PHOSPHATE 4 MG/ML IJ SOLN
20.0000 mg | Freq: Once | INTRAMUSCULAR | Status: AC
  Administered 2012-03-25: 20 mg via INTRAVENOUS

## 2012-03-25 MED ORDER — DIPHENHYDRAMINE HCL 50 MG/ML IJ SOLN
25.0000 mg | Freq: Once | INTRAMUSCULAR | Status: AC
  Administered 2012-03-25: 25 mg via INTRAVENOUS

## 2012-03-25 MED ORDER — FAMOTIDINE IN NACL 20-0.9 MG/50ML-% IV SOLN
20.0000 mg | Freq: Once | INTRAVENOUS | Status: AC
  Administered 2012-03-25: 20 mg via INTRAVENOUS

## 2012-03-25 MED ORDER — SODIUM CHLORIDE 0.9 % IV SOLN
Freq: Once | INTRAVENOUS | Status: AC
  Administered 2012-03-25: 12:00:00 via INTRAVENOUS

## 2012-03-25 NOTE — Telephone Encounter (Signed)
Gave patient appoitment for pet scan on 04-02-2012 arrival time 9:15am

## 2012-03-25 NOTE — Progress Notes (Signed)
ID: MILYN STAPLETON   DOB: Jan 07, 1941  MR#: [id number]  CSN#:[id number]  HISTORY OF PRESENT ILLNESS: [name] [name] [name] is a 71 year-old [place] woman I followed remotely for a history of breast cancer. More recently she noted that her belly was getting "bigger". Gradually she has developed increasing low back pain. After a period of several months, as her symptoms increased, she brought this problem to her primary physician's attention and a KUB was obtained on 10/07/2011, which was unremarkable. This was followed by a CT scan 10/15/2011, which showed ascites and omental caking. Paracentesis 10/18/2011 showed ([id number]) malignant cells consistent with metastatic adenocarcinoma, with equivocal staining for estrogen receptor. The pattern of additional stains was most suggestive of a primary gynecologic tumor. CA 125 at this time was 2051.  The patient was referred to gynecologic oncology and on 10/29/2011 underwent exploratory laparotomy under Drs. Cleda Mccreedy and Antionette Char. This consisted of a total abdominal hysterectomy with bilateral salpingo-oophorectomy, omentectomy, and radical tumor debulking. Unfortunately there was significant tumor attached to the diaphragm so the patient was suboptimally debulked. Accordingly a peritoneal catheter was not placed. GYN-ONC recommended 6 cycles of carboplatin/paclitaxel given every 3 weeks, with Neulasta on day 2 for granulocyte support. [name] [name] [name]'s subsequent history is as detailed below.  INTERVAL HISTORY:  [name] [name] [name] returns today with her husband [name] [name] [name] for followup of her ovarian carcinoma. Today is day 1 cycle 6 of her chemotherapy, and the question is whether we are going to proceed to 8 cycles as originally planned, particularly in light of her recent mini strokes. All that is discussed further below.  REVIEW OF SYSTEMS:  As far as the strokes go, she has minimal residuals. There is no focal weakness or sensory level. She feels a little bit more forgetful.  Of course she has problems with shortness of breath, which are chronic, and her diabetes remains in moderate control at best. Otherwise a detailed review of systems was noncontributory today and in particular she is tolerating chemotherapy without obvious problems. She has not had any intercurrent fever, bleeding, or rash, no nausea or vomiting, and her fatigue is mild to moderate. Currently she denies headaches, visual changes, dizziness or gait imbalance, or any visual changes.   PAST MEDICAL HISTORY: Past Medical History  Diagnosis Date  . History of breast cancer   . COPD (chronic obstructive pulmonary disease)   . GERD (gastroesophageal reflux disease)   . Diabetes mellitus     type II  . Hx of colonic polyps   . Osteoarthritis   . MVP (mitral valve prolapse)   . Dysrhythmia     pt states runs a little fast   . Shortness of breath     with exertion   . Depression   . Anxiety   . Heart murmur   . Cancer     left breast  . Ovarian cancer     active chemotherapy    PAST SURGICAL HISTORY: Past Surgical History  Procedure Date  . Cesarean section 1980, 1982    X 2   . Laparoscopy     work up for infertility  . Tympanoplasty     left X 2  . Left breast fibroadenoma removal 1960's  . Breast cyst aspirations   . Left modified radial mastectomy   . Laparotomy 10/29/2011    Procedure: EXPLORATORY LAPAROTOMY;  Surgeon: Rejeana Brock A. Duard Brady, MD;  Location: [facility];  Service: Gynecology;  Laterality: N/A;  . Abdominal hysterectomy 10/29/2011    Procedure: HYSTERECTOMY ABDOMINAL;  Surgeon:  Paola A. Duard Brady, MD;  Location: [facility];  Service: Gynecology;  Laterality: N/A;  Total abdominal hysterectomy  . Salpingoophorectomy 10/29/2011    Procedure: SALPINGO OOPHERECTOMY;  Surgeon: Rejeana Brock A. Duard Brady, MD;  Location: [facility];  Service: Gynecology;  Laterality: Bilateral;    FAMILY HISTORY Family History  Problem Relation Age of Onset  . Cancer Mother     endometrial/ colon  . Cancer Brother       colon  . COPD Other   . Hypertension Other   . Cancer Maternal Uncle     prostate/bladder/lung  . Cancer Paternal Aunt     unknown abdominal cancer  . Cancer Maternal Grandmother     Salivary gland cancer  . Cancer Paternal Aunt     leukemia   the patient's father died at the age of 62. The patient's mother died at the age of 15 she had a history of colon cancer and endometrial cancer. The patient has no sisters. She has one brother, with a history of colon cancer. There is no history of breast cancer in the family. The patient is scheduled for genetic testing later this month.  GYNECOLOGIC HISTORY: She had menarche age 58. First pregnancy to term was at age 4. She is GX T2. She became menopausal at the time of her chemotherapy for breast cancer. This was age 23. 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 Her husband [name] [name] [name] runs a Psychologist, sport and exercise out of their own home. Daughter [name] [name] [name], 53,  currently lives at home with the patient. There are some issues relating to this daughter that the patient discussed briefly. [place]. [name] [name] [name], 30, is an ED nurse at [employer]. The patient has no grandchildren. She is not a Advice worker.   ADVANCED DIRECTIVES: not in place  HEALTH MAINTENANCE: History  Substance Use Topics  . Smoking status: Current Everyday Smoker -- 0.5 packs/day for 30 years    Types: Cigarettes  . Smokeless tobacco: Never Used   Comment: quit again 2009; restarted 1 ppd 2011  . Alcohol Use: No     Colonoscopy: 2009  PAP: s/p hysterectomy  Bone density: 2010. "good"  Mammography: 2012 NOV--unremarkable  No Known Allergies  Current Outpatient Prescriptions  Medication Sig Dispense Refill  . acetaminophen (TYLENOL) 325 MG tablet Take 650 mg by mouth every 6 (six) hours as needed. For pain      . albuterol-ipratropium (COMBIVENT) 18-103 MCG/ACT inhaler Inhale 2 puffs into the lungs every 4 (four)  hours as needed. For wheezing      . atenolol (TENORMIN) 25 MG tablet Take 1 tablet (25 mg total) by mouth daily. 1/2 tab bid  30 tablet  11  . b complex vitamins tablet Take 1 tablet by mouth daily.        . budesonide-formoterol (SYMBICORT) 160-4.5 MCG/ACT inhaler Inhale 2 puffs into the lungs 2 (two) times daily.  1 Inhaler  5  . buPROPion (WELLBUTRIN SR) 150 MG 12 hr tablet Take 150 mg by mouth 2 (two) times daily.       . busPIRone (BUSPAR) 15 MG tablet Take 15 mg by mouth 2 (two) times daily.      . cholecalciferol (VITAMIN D) 1000 UNITS tablet Take 1,000 Units by mouth daily.        . clopidogrel (PLAVIX) 75 MG tablet Take 1 tablet (75 mg total) by mouth daily with breakfast.  60 tablet  3  . dexamethasone (DECADRON) 4 MG  tablet Take 8 mg by mouth 2 (two) times daily with a meal. Take 2 tablets by mouth two times a day starting the day after chemotherapy for 3 days.      . diazepam (VALIUM) 5 MG tablet Take 1-2 tablets (5-10 mg total) by mouth at bedtime as needed. For sleep  30 tablet  0  . Docusate Sodium (COLACE PO) Take 1 each by mouth daily as needed. For constipation  After treatments only      . ferrous sulfate 325 (65 FE) MG tablet Take 325 mg by mouth daily with breakfast.      . fish oil-omega-3 fatty acids 1000 MG capsule Take 1 g by mouth daily.      Marland Kitchen ipratropium-albuterol (DUONEB) 0.5-2.5 (3) MG/3ML SOLN Inhale 3 mLs into the lungs 3 (three) times daily.       Marland Kitchen lidocaine-prilocaine (EMLA) cream Apply 1 application topically as needed. For treatments      . loratadine (CLARITIN) 10 MG tablet Take 10 mg by mouth daily.      Marland Kitchen lovastatin (MEVACOR) 40 MG tablet Take 40 mg by mouth daily.      . metFORMIN (GLUCOPHAGE) 1000 MG tablet Take 1 tablet (1,000 mg total) by mouth 2 (two) times daily with a meal.  180 tablet  3  . omeprazole (PRILOSEC) 40 MG capsule Take 1 capsule (40 mg total) by mouth daily.  90 capsule  3  . ondansetron (ZOFRAN) 8 MG tablet Take 8 mg by mouth every 12  (twelve) hours as needed. For nausea      . prochlorperazine (COMPAZINE) 10 MG tablet Take 10 mg by mouth every 6 (six) hours as needed. For nausea/vomiting      . sertraline (ZOLOFT) 100 MG tablet Take 100 mg by mouth daily before breakfast.      . Linaclotide (LINZESS) 290 MCG CAPS Take 1 each by mouth daily as needed.  30 capsule  11  . oxymetazoline (AFRIN) 0.05 % nasal spray Place 2 sprays into the nose 2 (two) times daily as needed. Allergies       . DISCONTD: busPIRone (BUSPAR) 15 MG tablet Take 1 tablet (15 mg total) by mouth 2 (two) times daily.  180 tablet  1  . DISCONTD: roflumilast (DALIRESP) 500 MCG TABS tablet Take 500 mcg by mouth daily.          OBJECTIVE:   Filed Vitals:   03/25/12 1033  BP: 104/65  Pulse: 92  Temp: 98.1 F (36.7 C)  Resp: 20     Body mass index is 21.65 kg/(m^2).    ECOG FS: 1  Filed Weights   03/25/12 1033  Weight: 126 lb 1.6 oz (57.199 kg)   Physical Exam: middle-aged white woman in no acute distress HEENT:  Sclerae anicteric  Oropharynx clear. No mucositis or candidiasis noted.    Nodes:  No cervical, supraclavicular, or axillary lymphadenopathy palpated.  Breast Exam:  Deferred  Lungs:  Clear to auscultation bilaterally, although breath sounds are slightly diminished  Heart:  Regular rate and rhythm.   Abdomen:  Soft, nontender, nondistended.   No guarding or rebound tenderness. Positive bowel sounds.  No organomegaly or masses palpated.   Musculoskeletal:  No focal spinal tenderness to palpation.  Extremities:  Benign.  No peripheral edema or cyanosis.   Skin:  Benign.   Neuro:  Nonfocal. Cranial nerves are grossly intact. Patient is alert and oriented x3. Speech was clear, not slurred. No facial droop noted.   LAB  RESULTS: Lab Results  Component Value Date   WBC 11.3* 03/25/2012   NEUTROABS 8.7* 03/25/2012   HGB 11.6 03/25/2012   HCT 34.8 03/25/2012   MCV 95.2 03/25/2012   PLT 321 03/25/2012      Chemistry      Component Value  Date/Time   NA 136 03/25/2012 1011   K 4.0 03/25/2012 1011   CL 100 03/25/2012 1011   CO2 24 03/25/2012 1011   BUN 13 03/25/2012 1011   CREATININE 0.64 03/25/2012 1011      Component Value Date/Time   CALCIUM 9.7 03/25/2012 1011   ALKPHOS 114 03/25/2012 1011   AST 15 03/25/2012 1011   ALT 16 03/25/2012 1011   BILITOT 0.2* 03/25/2012 1011     Results for ALYSON, KI (MRN [id number]) as of 03/25/2012 12:33  Ref. Range 11/15/2011 10:40 12/02/2011 15:15 01/14/2012 10:55 02/25/2012 11:10 03/11/2012 13:18  CA 125 Latest Range: 0.0-30.2 U/mL 838.5 (H) 496.1 (H) 118.9 (H) 46.9 (H) 45.1 (H)   STUDIES:  02/22/2012 *RADIOLOGY REPORT*  Clinical Data: 71 year old female aphasia and weakness. Abnormal  MRI and MRA 1 day earlier.  History of breast cancer.  MRI HEAD WITH CONTRAST  Technique: Multiplanar, multiecho pulse sequences of the brain and  surrounding structures were obtained according to standard protocol  with intravenous contrast  Contrast: 10mL MULTIHANCE GADOBENATE DIMEGLUMINE 529 MG/ML IV SOLN  Comparison: Brain MRI and MRA 02/21/2012.  Findings: Diffusion imaging was repeated and now reveals a small  foci of restricted diffusion in the left sub insular white matter  and corona radiata out (series 3).  No ventriculomegaly. No mass effect in the brain.  Following contrast, No abnormal enhancement identified.  IMPRESSION:  1. Interval development of small acute infarcts in the left sub  insular white matter and corona radiata. See brain MRA report  02/21/2012. No mass effect.  2. No abnormal enhancement. No evidence of metastatic disease to  the brain.  Original Report Authenticated By: Harley Hallmark, M.D.   ASSESSMENT: 71 y.o.  [place]  (1) status post left modified radical mastectomy October 1993 for a T2 N0 invasive ductal breast, estrogen and progesterone receptor positive cancer treated adjuvantly with MFL (methotrexate/fluorouracil/leucovorin) chemotherapy x6,  completed April 1994, followed by 5 years of tamoxifen completed April 1999, with no evidence of recurrence  (2) now status post TAH BSO, omentectomy, and suboptimal debulking 10/29/2011 for a high-grade serous adenocarcinoma of the ovary, pT3c NX (Stage IIIC) receiving chemotherapy with a minimum of 6-8  q. three-week doses of carboplatin/paclitaxel, with Neulasta on day 2 for granulocyte support.  The goal is to proceed to at least 2 cycles beyond normalization of CA 125.  (3) COPD with ongoing tobacco abuse  (4) diabetes mellitus  (5)  BRCA 1 and BRCA 2 negative with a "genetic variant of uncertain significance"; additional genetic testing for Lynch syndrome pending.  (6)  status post hospitalization in July 2013 for stroke/small acute infarcts in the left sub insular white matter and coronal radiata.  PLAN:  Her CA 125 dropped from greater than 802 less than 45 after 4 cycles of chemotherapy. Repeat stayed about the same level, namely a round 45. It may be that this is her new baseline. She will have received 2 cycles of chemotherapy, counting today's treatment, after her that neither in her tumor marker. The question is whether we should proceed to 2 additional cycles, for a total of 8. To help [place] with that decision I am setting  her up for a PET scan before she meets with Dr. Nelly Rout of the first week in September. On the one hand she is certainly tolerating chemotherapy quite well. On the other hand given her comorbid conditions, mostly pulmonary and peripheral vascular, perhaps less is more at this point.  At any rate we are proceeding with this sixth cycle. If she is to receive his seventh and eighth one she would like to move them to Tuesdays. We will discuss that at the next visit here. Javarus Dorner C    03/25/2012

## 2012-03-26 ENCOUNTER — Ambulatory Visit (HOSPITAL_BASED_OUTPATIENT_CLINIC_OR_DEPARTMENT_OTHER): Payer: Medicare Other

## 2012-03-26 ENCOUNTER — Other Ambulatory Visit: Payer: Self-pay | Admitting: *Deleted

## 2012-03-26 ENCOUNTER — Telehealth: Payer: Self-pay | Admitting: Emergency Medicine

## 2012-03-26 VITALS — BP 116/61 | HR 88 | Temp 98.1°F

## 2012-03-26 DIAGNOSIS — K5900 Constipation, unspecified: Secondary | ICD-10-CM

## 2012-03-26 DIAGNOSIS — C569 Malignant neoplasm of unspecified ovary: Secondary | ICD-10-CM

## 2012-03-26 DIAGNOSIS — F419 Anxiety disorder, unspecified: Secondary | ICD-10-CM

## 2012-03-26 MED ORDER — DIAZEPAM 5 MG PO TABS: 5.0000 mg | ORAL_TABLET | Freq: Every evening | ORAL | Status: DC | PRN

## 2012-03-26 MED ORDER — PEGFILGRASTIM INJECTION 6 MG/0.6ML
6.0000 mg | Freq: Once | SUBCUTANEOUS | Status: AC
  Administered 2012-03-26: 6 mg via SUBCUTANEOUS
  Filled 2012-03-26: qty 0.6

## 2012-03-26 NOTE — Telephone Encounter (Signed)
Per MD, gave patient lab results of recent CA125.  Spoke with patient in regards to setting up appointment with Neurology.  I will call [facility] and notify them of patient's wishes.

## 2012-03-27 ENCOUNTER — Telehealth: Payer: Self-pay | Admitting: Genetic Counselor

## 2012-03-27 NOTE — Telephone Encounter (Signed)
Revealed negative [facility] panel testing.  Discussed with patient that we are left wondering about her BRCA VUS.  [facility] has looked in 3 different databases, including an Dietitian, and did not see reference to the VUS.  [facility] confirmed that they have not seen this since June when we did testing originally.

## 2012-03-31 ENCOUNTER — Encounter: Payer: Self-pay | Admitting: Physician Assistant

## 2012-03-31 ENCOUNTER — Ambulatory Visit (HOSPITAL_BASED_OUTPATIENT_CLINIC_OR_DEPARTMENT_OTHER): Payer: Medicare Other | Admitting: Physician Assistant

## 2012-03-31 ENCOUNTER — Other Ambulatory Visit (HOSPITAL_BASED_OUTPATIENT_CLINIC_OR_DEPARTMENT_OTHER): Payer: Medicare Other | Admitting: Lab

## 2012-03-31 ENCOUNTER — Encounter: Payer: Self-pay | Admitting: Genetic Counselor

## 2012-03-31 VITALS — BP 122/68 | HR 99 | Temp 98.5°F | Resp 20 | Ht 64.0 in | Wt 126.1 lb

## 2012-03-31 DIAGNOSIS — C569 Malignant neoplasm of unspecified ovary: Secondary | ICD-10-CM

## 2012-03-31 DIAGNOSIS — J449 Chronic obstructive pulmonary disease, unspecified: Secondary | ICD-10-CM

## 2012-03-31 DIAGNOSIS — F419 Anxiety disorder, unspecified: Secondary | ICD-10-CM

## 2012-03-31 DIAGNOSIS — Z853 Personal history of malignant neoplasm of breast: Secondary | ICD-10-CM

## 2012-03-31 DIAGNOSIS — F17200 Nicotine dependence, unspecified, uncomplicated: Secondary | ICD-10-CM

## 2012-03-31 LAB — CBC WITH DIFFERENTIAL/PLATELET
Basophils Absolute: 0.1 10*3/uL (ref 0.0–0.1)
EOS%: 0.4 % (ref 0.0–7.0)
HCT: 34.8 % (ref 34.8–46.6)
HGB: 12 g/dL (ref 11.6–15.9)
MCH: 31.3 pg (ref 25.1–34.0)
MONO#: 1.3 10*3/uL — ABNORMAL HIGH (ref 0.1–0.9)
NEUT%: 81.9 % — ABNORMAL HIGH (ref 38.4–76.8)
lymph#: 3.1 10*3/uL (ref 0.9–3.3)

## 2012-03-31 MED ORDER — DIAZEPAM 10 MG PO TABS: ORAL_TABLET | ORAL | Status: DC

## 2012-03-31 NOTE — Progress Notes (Signed)
ID: RAYHANA SLIDER   DOB: 1940/11/04  MR#: [id number]  CSN#:[id number]  HISTORY OF PRESENT ILLNESS: Jaidynn is a 71 year-old [place] woman I followed remotely for a history of breast cancer. More recently she noted that her belly was getting "bigger". Gradually she has developed increasing low back pain. After a period of several months, as her symptoms increased, she brought this problem to her primary physician's attention and a KUB was obtained on 10/07/2011, which was unremarkable. This was followed by a CT scan 10/15/2011, which showed ascites and omental caking. Paracentesis 10/18/2011 showed ([id number]) malignant cells consistent with metastatic adenocarcinoma, with equivocal staining for estrogen receptor. The pattern of additional stains was most suggestive of a primary gynecologic tumor. CA 125 at this time was 2051.  The patient was referred to gynecologic oncology and on 10/29/2011 underwent exploratory laparotomy under Drs. Cleda Mccreedy and Antionette Char. This consisted of a total abdominal hysterectomy with bilateral salpingo-oophorectomy, omentectomy, and radical tumor debulking. Unfortunately there was significant tumor attached to the diaphragm so the patient was suboptimally debulked. Accordingly a peritoneal catheter was not placed. GYN-ONC recommended 6 cycles of carboplatin/paclitaxel given every 3 weeks, with Neulasta on day 2 for granulocyte support. Jadon's subsequent history is as detailed below.  INTERVAL HISTORY:  Namita returns today for followup of her ovarian carcinoma. Today is day 7 cycle 6 of her chemotherapy.  Interval history is generally unremarkable. Helaina has had no evidence of additional strokes. She has been very busy this week planning her daughter's wedding which is scheduled for this next weekend. She is a little tired today, but otherwise feels well.  REVIEW OF SYSTEMS:  Lakia denies any fevers or chills. She's had no rashes or skin changes.  She denies abnormal bleeding or bruising. No nausea or emesis, and no change in bowel or bladder habits. She continues to have shortness of breath which is stable. She has an occasional cough. No abnormal headaches, dizziness, gait disturbance, visual changes, or seizure activity. No slurred speech. There is no focal weakness.  A detailed review of systems is otherwise noncontributory.   PAST MEDICAL HISTORY: Past Medical History  Diagnosis Date  . History of breast cancer   . COPD (chronic obstructive pulmonary disease)   . GERD (gastroesophageal reflux disease)   . Diabetes mellitus     type II  . Hx of colonic polyps   . Osteoarthritis   . MVP (mitral valve prolapse)   . Dysrhythmia     pt states runs a little fast   . Shortness of breath     with exertion   . Depression   . Anxiety   . Heart murmur   . Cancer     left breast  . Ovarian cancer     active chemotherapy    PAST SURGICAL HISTORY: Past Surgical History  Procedure Date  . Cesarean section 1980, 1982    X 2   . Laparoscopy     work up for infertility  . Tympanoplasty     left X 2  . Left breast fibroadenoma removal 1960's  . Breast cyst aspirations   . Left modified radial mastectomy   . Laparotomy 10/29/2011    Procedure: EXPLORATORY LAPAROTOMY;  Surgeon: Rejeana Brock A. Duard Brady, MD;  Location: [facility];  Service: Gynecology;  Laterality: N/A;  . Abdominal hysterectomy 10/29/2011    Procedure: HYSTERECTOMY ABDOMINAL;  Surgeon: Rejeana Brock A. Duard Brady, MD;  Location: [facility];  Service: Gynecology;  Laterality: N/A;  Total abdominal hysterectomy  .  Salpingoophorectomy 10/29/2011    Procedure: SALPINGO OOPHERECTOMY;  Surgeon: Rejeana Brock A. Duard Brady, MD;  Location: [facility];  Service: Gynecology;  Laterality: Bilateral;    FAMILY HISTORY Family History  Problem Relation Age of Onset  . Cancer Mother     endometrial/ colon  . Cancer Brother     colon  . COPD Other   . Hypertension Other   . Cancer Maternal Uncle      prostate/bladder/lung  . Cancer Paternal Aunt     unknown abdominal cancer  . Cancer Maternal Grandmother     Salivary gland cancer  . Cancer Paternal Aunt     leukemia   the patient's father died at the age of 56. The patient's mother died at the age of 29 she had a history of colon cancer and endometrial cancer. The patient has no sisters. She has one brother, with a history of colon cancer. There is no history of breast cancer in the family. The patient is scheduled for genetic testing later this month.  GYNECOLOGIC HISTORY: She had menarche age 73. First pregnancy to term was at age 81. She is GX T2. She became menopausal at the time of her chemotherapy for breast cancer. This was age 76. 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 Her husband [name] runs a Psychologist, sport and exercise out of their own home. Daughter [name], 28,  currently lives at home with the patient. There are some issues relating to this daughter that the patient discussed briefly. Dauhter. [name], 30, is an ED nurse at [employer]. The patient has no grandchildren. She is not a Advice worker.   ADVANCED DIRECTIVES: not in place  HEALTH MAINTENANCE: History  Substance Use Topics  . Smoking status: Current Everyday Smoker -- 0.5 packs/day for 30 years    Types: Cigarettes  . Smokeless tobacco: Never Used   Comment: quit again 2009; restarted 1 ppd 2011  . Alcohol Use: No     Colonoscopy: 2009  PAP: s/p hysterectomy  Bone density: 2010. "good"  Mammography: 2012 NOV--unremarkable  No Known Allergies  Current Outpatient Prescriptions  Medication Sig Dispense Refill  . acetaminophen (TYLENOL) 325 MG tablet Take 650 mg by mouth every 6 (six) hours as needed. For pain      . albuterol-ipratropium (COMBIVENT) 18-103 MCG/ACT inhaler Inhale 2 puffs into the lungs every 4 (four) hours as needed. For wheezing      . atenolol (TENORMIN) 25 MG tablet Take 1  tablet (25 mg total) by mouth daily. 1/2 tab bid  30 tablet  11  . b complex vitamins tablet Take 1 tablet by mouth daily.        . budesonide-formoterol (SYMBICORT) 160-4.5 MCG/ACT inhaler Inhale 2 puffs into the lungs 2 (two) times daily.  1 Inhaler  5  . buPROPion (WELLBUTRIN SR) 150 MG 12 hr tablet Take 150 mg by mouth 2 (two) times daily.       . busPIRone (BUSPAR) 15 MG tablet Take 15 mg by mouth 2 (two) times daily.      . cholecalciferol (VITAMIN D) 1000 UNITS tablet Take 1,000 Units by mouth daily.        . clopidogrel (PLAVIX) 75 MG tablet Take 1 tablet (75 mg total) by mouth daily with breakfast.  60 tablet  3  . dexamethasone (DECADRON) 4 MG tablet Take 8 mg by mouth 2 (two) times daily with a meal. Take 2 tablets by mouth two times a  day starting the day after chemotherapy for 3 days.      . diazepam (VALIUM) 10 MG tablet 1 po 1 hr before procedure, then 1 po at time of procedure  2 tablet  0  . diazepam (VALIUM) 5 MG tablet Take 1-2 tablets (5-10 mg total) by mouth at bedtime as needed. For sleep  30 tablet  0  . Docusate Sodium (COLACE PO) Take 1 each by mouth daily as needed. For constipation  After treatments only      . ferrous sulfate 325 (65 FE) MG tablet Take 325 mg by mouth daily with breakfast.      . fish oil-omega-3 fatty acids 1000 MG capsule Take 1 g by mouth daily.      Marland Kitchen ipratropium-albuterol (DUONEB) 0.5-2.5 (3) MG/3ML SOLN Inhale 3 mLs into the lungs 3 (three) times daily.       Marland Kitchen lidocaine-prilocaine (EMLA) cream Apply 1 application topically as needed. For treatments      . Linaclotide (LINZESS) 290 MCG CAPS Take 1 each by mouth daily as needed.  30 capsule  11  . loratadine (CLARITIN) 10 MG tablet Take 10 mg by mouth daily.      Marland Kitchen lovastatin (MEVACOR) 40 MG tablet Take 40 mg by mouth daily.      . metFORMIN (GLUCOPHAGE) 1000 MG tablet Take 1 tablet (1,000 mg total) by mouth 2 (two) times daily with a meal.  180 tablet  3  . omeprazole (PRILOSEC) 40 MG capsule Take  1 capsule (40 mg total) by mouth daily.  90 capsule  3  . ondansetron (ZOFRAN) 8 MG tablet Take 8 mg by mouth every 12 (twelve) hours as needed. For nausea      . oxymetazoline (AFRIN) 0.05 % nasal spray Place 2 sprays into the nose 2 (two) times daily as needed. Allergies       . prochlorperazine (COMPAZINE) 10 MG tablet Take 10 mg by mouth every 6 (six) hours as needed. For nausea/vomiting      . sertraline (ZOLOFT) 100 MG tablet Take 100 mg by mouth daily before breakfast.      . DISCONTD: busPIRone (BUSPAR) 15 MG tablet Take 1 tablet (15 mg total) by mouth 2 (two) times daily.  180 tablet  1  . DISCONTD: roflumilast (DALIRESP) 500 MCG TABS tablet Take 500 mcg by mouth daily.          OBJECTIVE:   Filed Vitals:   03/31/12 1039  BP: 122/68  Pulse: 99  Temp: 98.5 F (36.9 C)  Resp: 20     Body mass index is 21.65 kg/(m^2).    ECOG FS: 1  Filed Weights   03/31/12 1039  Weight: 126 lb 1.6 oz (57.199 kg)   Physical Exam: middle-aged white woman in no acute distress HEENT:  Sclerae anicteric  Oropharynx clear.  Nodes:  No cervical, supraclavicular, or axillary lymphadenopathy palpated.  Breast Exam:  Deferred  Lungs:  Clear to auscultation bilaterally, although breath sounds are slightly diminished  Heart:  Regular rate and rhythm.   Abdomen:  Soft, nontender, nondistended.   No guarding or rebound tenderness. Positive bowel sounds. Musculoskeletal:  No focal spinal tenderness to palpation.  Extremities:  No peripheral edema or cyanosis.   Neuro:  Nonfocal. Patient is alert and oriented x3. Speech was clear, not slurred. No facial droop noted.   LAB RESULTS: Lab Results  Component Value Date   WBC 25.1* 03/31/2012   NEUTROABS 20.6* 03/31/2012   HGB 12.0 03/31/2012  HCT 34.8 03/31/2012   MCV 90.9 03/31/2012   PLT 265 03/31/2012      Chemistry      Component Value Date/Time   NA 136 03/25/2012 1011   K 4.0 03/25/2012 1011   CL 100 03/25/2012 1011   CO2 24 03/25/2012 1011   BUN  13 03/25/2012 1011   CREATININE 0.64 03/25/2012 1011      Component Value Date/Time   CALCIUM 9.7 03/25/2012 1011   ALKPHOS 114 03/25/2012 1011   AST 15 03/25/2012 1011   ALT 16 03/25/2012 1011   BILITOT 0.2* 03/25/2012 1011     Results for DEVLYNN, KNOFF (MRN [id number]) as of 03/25/2012 12:33  Ref. Range 11/15/2011 10:40 12/02/2011 15:15 01/14/2012 10:55 02/25/2012 11:10 03/11/2012 13:18  CA 125 Latest Range: 0.0-30.2 U/mL 838.5 (H) 496.1 (H) 118.9 (H) 46.9 (H) 45.1 (H)   STUDIES:  No results found.   ASSESSMENT: 71 y.o.  [place] woman  (1) status post left modified radical mastectomy October 1993 for a T2 N0 invasive ductal breast, estrogen and progesterone receptor positive cancer treated adjuvantly with MFL (methotrexate/fluorouracil/leucovorin) chemotherapy x6, completed April 1994, followed by 5 years of tamoxifen completed April 1999, with no evidence of recurrence  (2) now status post TAH BSO, omentectomy, and suboptimal debulking 10/29/2011 for a high-grade serous adenocarcinoma of the ovary, pT3c NX (Stage IIIC) receiving chemotherapy with a minimum of 6-8  q. three-week doses of carboplatin/paclitaxel, with Neulasta on day 2 for granulocyte support.  The goal is to proceed to at least 2 cycles beyond normalization of CA 125.  (3) COPD with ongoing tobacco abuse  (4) diabetes mellitus  (5)  BRCA 1 and BRCA 2 negative with a "genetic variant of uncertain significance"; additional genetic testing for Lynch syndrome pending.  (6)  status post hospitalization in July 2013 for stroke/small acute infarcts in the left sub insular white matter and coronal radiata.  PLAN:  There remains a question of whether or not Dayanna will proceed with 2 additional cycles, for total of 8. She is already scheduled for a PET scan later this week, and will meet with Dr. Nelly Rout next week for her input. We will decide, accordingly, whether or not to proceed with her seventh and eighth treatments  when she returns to see me on September 10. Per her request, we have moved her treatment days to Tuesdays.  Hasana has problems with claustrophobia during her scans, and I have given her prescription for 2 tablets of diazepam 10 mg, with instructions to take 1 tablet 1 hour before the procedure, and 1 tablet at the time of procedure.  Elizabeth voices understanding and agreement with our plan thus far, and will call with any changes or problems.   Rayburn Mundis    03/31/2012

## 2012-04-01 ENCOUNTER — Telehealth: Payer: Self-pay | Admitting: *Deleted

## 2012-04-01 NOTE — Telephone Encounter (Signed)
Moved patient's appointments to Tuesday as requested 04-14-2012 dr.berry confirmed that patient can come in at 10:15am to see her sent [name] [name] email to move the patient's appointment

## 2012-04-02 ENCOUNTER — Encounter (HOSPITAL_COMMUNITY): Payer: Self-pay

## 2012-04-02 ENCOUNTER — Encounter (HOSPITAL_COMMUNITY)
Admission: RE | Admit: 2012-04-02 | Discharge: 2012-04-02 | Disposition: A | Discharge status: Home / Self Care | Payer: Medicare Other | Source: Ambulatory Visit | Attending: Oncology | Admitting: Oncology

## 2012-04-02 DIAGNOSIS — Z90710 Acquired absence of both cervix and uterus: Secondary | ICD-10-CM | POA: Insufficient documentation

## 2012-04-02 DIAGNOSIS — Z9079 Acquired absence of other genital organ(s): Secondary | ICD-10-CM | POA: Insufficient documentation

## 2012-04-02 DIAGNOSIS — R918 Other nonspecific abnormal finding of lung field: Secondary | ICD-10-CM | POA: Insufficient documentation

## 2012-04-02 DIAGNOSIS — Z9010 Acquired absence of unspecified breast and nipple: Secondary | ICD-10-CM | POA: Insufficient documentation

## 2012-04-02 DIAGNOSIS — C569 Malignant neoplasm of unspecified ovary: Secondary | ICD-10-CM | POA: Insufficient documentation

## 2012-04-02 DIAGNOSIS — I2510 Atherosclerotic heart disease of native coronary artery without angina pectoris: Secondary | ICD-10-CM | POA: Insufficient documentation

## 2012-04-02 LAB — GLUCOSE, CAPILLARY: Glucose-Capillary: 120 mg/dL — ABNORMAL HIGH (ref 70–99)

## 2012-04-02 MED ORDER — FLUDEOXYGLUCOSE F - 18 (FDG) INJECTION
14.1000 | Freq: Once | INTRAVENOUS | Status: AC | PRN
  Administered 2012-04-02: 14.1 via INTRAVENOUS

## 2012-04-07 ENCOUNTER — Ambulatory Visit: Payer: Medicare Other | Admitting: Internal Medicine

## 2012-04-08 ENCOUNTER — Telehealth: Payer: Self-pay | Admitting: Oncology

## 2012-04-08 NOTE — Telephone Encounter (Signed)
S/w the pt and she is aware of her appt time change on 04/14/2012

## 2012-04-09 ENCOUNTER — Encounter: Payer: Self-pay | Admitting: Gynecologic Oncology

## 2012-04-09 ENCOUNTER — Ambulatory Visit: Payer: Medicare Other | Attending: Gynecologic Oncology | Admitting: Gynecologic Oncology

## 2012-04-09 VITALS — BP 106/64 | HR 84 | Temp 97.9°F | Resp 16 | Ht 64.84 in | Wt 127.7 lb

## 2012-04-09 DIAGNOSIS — Z79899 Other long term (current) drug therapy: Secondary | ICD-10-CM | POA: Insufficient documentation

## 2012-04-09 DIAGNOSIS — Z9010 Acquired absence of unspecified breast and nipple: Secondary | ICD-10-CM | POA: Insufficient documentation

## 2012-04-09 DIAGNOSIS — R911 Solitary pulmonary nodule: Secondary | ICD-10-CM | POA: Insufficient documentation

## 2012-04-09 DIAGNOSIS — J4489 Other specified chronic obstructive pulmonary disease: Secondary | ICD-10-CM | POA: Insufficient documentation

## 2012-04-09 DIAGNOSIS — C569 Malignant neoplasm of unspecified ovary: Secondary | ICD-10-CM | POA: Insufficient documentation

## 2012-04-09 DIAGNOSIS — E119 Type 2 diabetes mellitus without complications: Secondary | ICD-10-CM | POA: Insufficient documentation

## 2012-04-09 DIAGNOSIS — Z9079 Acquired absence of other genital organ(s): Secondary | ICD-10-CM | POA: Insufficient documentation

## 2012-04-09 DIAGNOSIS — Z90710 Acquired absence of both cervix and uterus: Secondary | ICD-10-CM | POA: Insufficient documentation

## 2012-04-09 DIAGNOSIS — Z853 Personal history of malignant neoplasm of breast: Secondary | ICD-10-CM | POA: Insufficient documentation

## 2012-04-09 DIAGNOSIS — J449 Chronic obstructive pulmonary disease, unspecified: Secondary | ICD-10-CM | POA: Insufficient documentation

## 2012-04-09 DIAGNOSIS — K219 Gastro-esophageal reflux disease without esophagitis: Secondary | ICD-10-CM | POA: Insufficient documentation

## 2012-04-09 NOTE — Patient Instructions (Signed)
Two additional cycles of Taxol/Carbo  F/U in 6 months  Thank you very much Ms. DIANNIA HOGENSON for allowing me to provide care for you today.  I appreciate your confidence in choosing our Gynecologic Oncology team.  If you have any questions about your visit today please call our office and we will get back to you as soon as possible.  [name]. Rubens Cranston MD., PhD Gynecologic Oncology

## 2012-04-09 NOTE — Progress Notes (Signed)
Office Visit  Note: Gyn-Onc  [name] [name] 71 y.o. female  CC:  Chief Complaint  Patient presents with  . Ovarian Cancer    Follow up    HPI: HISTORY OF PRESENT ILLNESS:  [name] [name] is a 71 y.o.  year-old woman a history of breast cancer. [name] [name] 2013  she noted that her belly was getting "bigger". Gradually she has developed increasing low back pain. After a period of several months, as her symptoms increased, she brought this problem to her primary physician's attention and a KUB was obtained on 10/07/2011, which was unremarkable. This was followed by a CT scan 10/15/2011, which showed ascites and omental caking. Paracentesis 10/18/2011 showed  malignant cells consistent with metastatic adenocarcinoma, with equivocal staining for estrogen receptor. The pattern of additional stains was most suggestive of a primary gynecologic tumor. CA 125 at this time was 2051.   On 10/29/2011, she underwent exploratory laparotomy under Drs. Cleda Mccreedy and Antionette Char. This consisted of a total abdominal hysterectomy with bilateral salpingo-oophorectomy, omentectomy, and radical tumor debulking. Unfortunately there was significant tumor attached to the diaphragm so the patient was suboptimally debulked. Accordingly a peritoneal catheter was not placed.   Specific operative findings included extensive carcinomatosis with significant disease involving the diaphragm on the right side. The majority of nodules are less than 1 cm however there were somewhat confluent. The omentum had a 6 cm primary nodule which was completely removed. At the conclusion of the procedure she had a 2 cm nodule involving the cul-de-sac, diffuse small miliary volume disease involving the mesentery of the small and large bowel, and subcentimeter disease involving the right hemidiaphragm however it was fairly confluent. 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 It was high grade. All biopsies omentum were  positive. The serosa of the uterus was positive.    INTERVAL HISTORY: She's completed 6 cycles of Taxol carboplatin therapy. Her CA 125 prior to chemotherapy was 2050.7. The most recent value as of 03/2012 is 39. A PET CT was obtained 04/02/2012  Findings IMPRESSION:  1. Diffuse low level hypermetabolic activity throughout thevisualized axial and appendicular skeleton, without corresponding aggressive appearing bony lesions on the CT portion of the examination. This appearance is most characteristic for marrow stimulation, which is consistent with the patient's history of  Neulasta treatment.  2. No definite signs of residual or metastatic disease in the neck, chest, abdomen or pelvis. 3. There is, however, a nonspecific 6 mm subpleural nodule in the anterior aspect of the left upper lobe  This warrants attention on follow-up studies, as a solitary metastasis or primary lung lesion is not excluded, although small lesions in subpleural locations like this are commonly benign subpleural lymph nodes.   Current Meds:  Outpatient Encounter Prescriptions as of 04/09/2012  Medication Sig Dispense Refill  . acetaminophen (TYLENOL) 325 MG tablet Take 650 mg by mouth every 6 (six) hours as needed. For pain      . albuterol-ipratropium (COMBIVENT) 18-103 MCG/ACT inhaler Inhale 2 puffs into the lungs every 4 (four) hours as needed. For wheezing      . atenolol (TENORMIN) 25 MG tablet Take 1 tablet (25 mg total) by mouth daily. 1/2 tab bid  30 tablet  11  . b complex vitamins tablet Take 1 tablet by mouth daily.        . budesonide-formoterol (SYMBICORT) 160-4.5 MCG/ACT inhaler Inhale 2 puffs into the lungs 2 (two) times daily.  1 Inhaler  5  . buPROPion [facility] SR) 150  MG 12 hr tablet Take 150 mg by mouth 2 (two) times daily.       . busPIRone (BUSPAR) 15 MG tablet Take 15 mg by mouth 2 (two) times daily.      . cholecalciferol (VITAMIN D) 1000 UNITS tablet Take 1,000 Units by mouth daily.        .  clopidogrel (PLAVIX) 75 MG tablet Take 1 tablet (75 mg total) by mouth daily with breakfast.  60 tablet  3  . dexamethasone (DECADRON) 4 MG tablet Take 8 mg by mouth 2 (two) times daily with a meal. Take 2 tablets by mouth two times a day starting the day after chemotherapy for 3 days.      . diazepam (VALIUM) 10 MG tablet 1 po 1 hr before procedure, then 1 po at time of procedure  2 tablet  0  . diazepam (VALIUM) 5 MG tablet Take 1-2 tablets (5-10 mg total) by mouth at bedtime as needed. For sleep  30 tablet  0  . Docusate Sodium (COLACE PO) Take 1 each by mouth daily as needed. For constipation  After treatments only      . ferrous sulfate 325 (65 FE) MG tablet Take 325 mg by mouth daily with breakfast.      . fish oil-omega-3 fatty acids 1000 MG capsule Take 1 g by mouth daily.      Marland Kitchen ipratropium-albuterol (DUONEB) 0.5-2.5 (3) MG/3ML SOLN Inhale 3 mLs into the lungs 3 (three) times daily.       Marland Kitchen lidocaine-prilocaine (EMLA) cream Apply 1 application topically as needed. For treatments      . Linaclotide (LINZESS) 290 MCG CAPS Take 1 each by mouth daily as needed.  30 capsule  11  . loratadine (CLARITIN) 10 MG tablet Take 10 mg by mouth daily.      Marland Kitchen lovastatin (MEVACOR) 40 MG tablet Take 40 mg by mouth daily.      . metFORMIN (GLUCOPHAGE) 1000 MG tablet Take 1 tablet (1,000 mg total) by mouth 2 (two) times daily with a meal.  180 tablet  3  . omeprazole (PRILOSEC) 40 MG capsule Take 1 capsule (40 mg total) by mouth daily.  90 capsule  3  . ondansetron (ZOFRAN) 8 MG tablet Take 8 mg by mouth every 12 (twelve) hours as needed. For nausea      . oxymetazoline (AFRIN) 0.05 % nasal spray Place 2 sprays into the nose 2 (two) times daily as needed. Allergies       . prochlorperazine (COMPAZINE) 10 MG tablet Take 10 mg by mouth every 6 (six) hours as needed. For nausea/vomiting      . sertraline (ZOLOFT) 100 MG tablet Take 100 mg by mouth daily before breakfast.        Allergy: No Known  Allergies  Social Hx:  Patient's husband is present.  She is eager to resume fishing.  Past Surgical Hx:  Past Surgical History  Procedure Date  . Cesarean section 1980, 1982    X 2   . Laparoscopy     work up for infertility  . Tympanoplasty     left X 2  . Left breast fibroadenoma removal 1960's  . Breast cyst aspirations   . Left modified radial mastectomy   . Laparotomy 10/29/2011    Procedure: EXPLORATORY LAPAROTOMY;  Surgeon: Rejeana Brock A. Duard Brady, MD;  Location: [facility];  Service: Gynecology;  Laterality: N/A;  . Abdominal hysterectomy 10/29/2011    Procedure: HYSTERECTOMY ABDOMINAL;  Surgeon: Rejeana Brock A.  Duard Brady, MD;  Location: [facility];  Service: Gynecology;  Laterality: N/A;  Total abdominal hysterectomy  . Salpingoophorectomy 10/29/2011    Procedure: SALPINGO OOPHERECTOMY;  Surgeon: Rejeana Brock A. Duard Brady, MD;  Location: [facility];  Service: Gynecology;  Laterality: Bilateral;    Past Medical Hx:  Past Medical History  Diagnosis Date  . History of breast cancer   . COPD (chronic obstructive pulmonary disease)   . GERD (gastroesophageal reflux disease)   . Diabetes mellitus     type II  . Hx of colonic polyps   . Osteoarthritis   . MVP (mitral valve prolapse)   . Dysrhythmia     pt states runs a little fast   . Shortness of breath     with exertion   . Depression   . Anxiety   . Heart murmur   . Cancer     left breast  . Ovarian cancer     active chemotherapy    Family Hx:  Family History  Problem Relation Age of Onset  . Cancer Mother     endometrial/ colon  . Cancer Brother     colon  . COPD Other   . Hypertension Other   . Cancer Maternal Uncle     prostate/bladder/lung  . Cancer Paternal Aunt     unknown abdominal cancer  . Cancer Maternal Grandmother     Salivary gland cancer  . Cancer Paternal Aunt     leukemia  REVIEW OF SYSTEMS:   Constitutional: Patient feels well, is very anxious about the PET results. Cardiovascular: Denies chest pain shortness of breath  or lower extremity edema Pulmonary: No shortness of breath hemoptysis or cough GI: No evidence of recurrent ascites no nausea vomiting abdominal pain reports normal bowel movements denies diarrhea or hematochezia GYN: No vaginal bleeding or discharge no dysuria or incontinence Musculoskeletal: Reports some myalgia associated with Neulasta denies any neuropathy Skin: Denies any new lesions Otherwise 10 point review of systems  within normal limits   Vitals:  Blood pressure 106/64, pulse 84, temperature 97.9 F (36.6 C), resp. rate 16, height 5' 4.84" (1.647 m), weight 127 lb 11.2 oz (57.924 kg). Body mass index is 21.36 kg/(m^2).  Physical Exam: This  well-developed female, tearful and anxious. Chest: Clear to auscultation bilaterally Cardiac: Regular rate and rhythm no discernible murmurs Abdomen: Soft,. She has a well-healed vertical midline incision. It is nondistended. There is no fluid wave. Midline vaginal incision is intact without any hernia Pelvic: Normal surgeon to Bartholin's urethra and Skene's.  There is no nodularity within the vagina or cul-de-sac is no evidence of vaginal discharge or bleeding Rectal: Good anal sphincter tone external hemorrhoids appreciated no nodularity rectovaginal septum Back: No CVA tenderness Extremities: No edema. LN:  NO cervical supra clavicular or inguinal adenopathy  Assessment/Plan:  This is a 71 y.o. year-old with suboptimally debulked stage IIIc ovarian carcinoma who is doing well status post surgery. She has completed 6 cycles of chemotherapy with a decrease in her CA 125 to 39.  PET is without evidence of disease.  I agree with Dr. Darrall Dears plan for two additional cycles of chemotherapy.  If no further reduction in CA 125 could consider a protracted course of tamoxifen.  Will defer the management/F/U  of the pulmonary nodule to Dr. Darnelle Catalan.  Laurette Schimke, MD 04/09/2012, 10:25 PM

## 2012-04-10 ENCOUNTER — Other Ambulatory Visit: Payer: Self-pay | Admitting: *Deleted

## 2012-04-10 ENCOUNTER — Other Ambulatory Visit: Payer: Self-pay | Admitting: Physician Assistant

## 2012-04-10 DIAGNOSIS — F419 Anxiety disorder, unspecified: Secondary | ICD-10-CM

## 2012-04-10 DIAGNOSIS — C569 Malignant neoplasm of unspecified ovary: Secondary | ICD-10-CM

## 2012-04-10 MED ORDER — DIAZEPAM 5 MG PO TABS: 5.0000 mg | ORAL_TABLET | Freq: Every evening | ORAL | Status: DC | PRN

## 2012-04-14 ENCOUNTER — Encounter: Payer: Self-pay | Admitting: Physician Assistant

## 2012-04-14 ENCOUNTER — Telehealth: Payer: Self-pay | Admitting: *Deleted

## 2012-04-14 ENCOUNTER — Other Ambulatory Visit (HOSPITAL_BASED_OUTPATIENT_CLINIC_OR_DEPARTMENT_OTHER): Payer: Medicare Other | Admitting: Lab

## 2012-04-14 ENCOUNTER — Ambulatory Visit (HOSPITAL_BASED_OUTPATIENT_CLINIC_OR_DEPARTMENT_OTHER): Payer: Medicare Other

## 2012-04-14 ENCOUNTER — Ambulatory Visit (HOSPITAL_BASED_OUTPATIENT_CLINIC_OR_DEPARTMENT_OTHER): Payer: Medicare Other | Admitting: Physician Assistant

## 2012-04-14 VITALS — BP 119/69 | HR 101 | Temp 98.4°F | Resp 20 | Ht 64.5 in | Wt 127.6 lb

## 2012-04-14 DIAGNOSIS — F17200 Nicotine dependence, unspecified, uncomplicated: Secondary | ICD-10-CM

## 2012-04-14 DIAGNOSIS — Z5111 Encounter for antineoplastic chemotherapy: Secondary | ICD-10-CM

## 2012-04-14 DIAGNOSIS — Z853 Personal history of malignant neoplasm of breast: Secondary | ICD-10-CM

## 2012-04-14 DIAGNOSIS — J449 Chronic obstructive pulmonary disease, unspecified: Secondary | ICD-10-CM

## 2012-04-14 DIAGNOSIS — C569 Malignant neoplasm of unspecified ovary: Secondary | ICD-10-CM

## 2012-04-14 DIAGNOSIS — K5900 Constipation, unspecified: Secondary | ICD-10-CM

## 2012-04-14 LAB — COMPREHENSIVE METABOLIC PANEL (CC13)
ALT: 18 U/L (ref 0–55)
Albumin: 3.9 g/dL (ref 3.5–5.0)
CO2: 24 mEq/L (ref 22–29)
Chloride: 104 mEq/L (ref 98–107)
Potassium: 4 mEq/L (ref 3.5–5.1)
Sodium: 138 mEq/L (ref 136–145)
Total Bilirubin: 0.4 mg/dL (ref 0.20–1.20)
Total Protein: 6.8 g/dL (ref 6.4–8.3)

## 2012-04-14 LAB — CBC WITH DIFFERENTIAL/PLATELET
Basophils Absolute: 0 10*3/uL (ref 0.0–0.1)
EOS%: 0.9 % (ref 0.0–7.0)
Eosinophils Absolute: 0.1 10*3/uL (ref 0.0–0.5)
HCT: 34.2 % — ABNORMAL LOW (ref 34.8–46.6)
HGB: 11.5 g/dL — ABNORMAL LOW (ref 11.6–15.9)
MONO#: 0.8 10*3/uL (ref 0.1–0.9)
NEUT#: 7.7 10*3/uL — ABNORMAL HIGH (ref 1.5–6.5)
NEUT%: 72.4 % (ref 38.4–76.8)
RDW: 16.1 % — ABNORMAL HIGH (ref 11.2–14.5)
WBC: 10.6 10*3/uL — ABNORMAL HIGH (ref 3.9–10.3)
lymph#: 2 10*3/uL (ref 0.9–3.3)

## 2012-04-14 MED ORDER — HEPARIN SOD (PORK) LOCK FLUSH 100 UNIT/ML IV SOLN
500.0000 [IU] | Freq: Once | INTRAVENOUS | Status: AC | PRN
  Administered 2012-04-14: 500 [IU]
  Filled 2012-04-14: qty 5

## 2012-04-14 MED ORDER — SODIUM CHLORIDE 0.9 % IJ SOLN
10.0000 mL | INTRAMUSCULAR | Status: DC | PRN
  Administered 2012-04-14: 10 mL
  Filled 2012-04-14: qty 10

## 2012-04-14 MED ORDER — DEXAMETHASONE SODIUM PHOSPHATE 4 MG/ML IJ SOLN
20.0000 mg | Freq: Once | INTRAMUSCULAR | Status: AC
  Administered 2012-04-14: 20 mg via INTRAVENOUS

## 2012-04-14 MED ORDER — SODIUM CHLORIDE 0.9 % IV SOLN
Freq: Once | INTRAVENOUS | Status: AC
  Administered 2012-04-14: 12:00:00 via INTRAVENOUS

## 2012-04-14 MED ORDER — SODIUM CHLORIDE 0.9 % IV SOLN
430.0000 mg | Freq: Once | INTRAVENOUS | Status: AC
  Administered 2012-04-14: 430 mg via INTRAVENOUS
  Filled 2012-04-14: qty 43

## 2012-04-14 MED ORDER — PACLITAXEL CHEMO INJECTION 300 MG/50ML
175.0000 mg/m2 | Freq: Once | INTRAVENOUS | Status: AC
  Administered 2012-04-14: 276 mg via INTRAVENOUS
  Filled 2012-04-14: qty 46

## 2012-04-14 MED ORDER — SODIUM CHLORIDE 0.9 % IV SOLN: 500.0000 mg | Freq: Once | INTRAVENOUS | Status: DC

## 2012-04-14 MED ORDER — ONDANSETRON 16 MG/50ML IVPB (CHCC)
16.0000 mg | Freq: Once | INTRAVENOUS | Status: AC
  Administered 2012-04-14: 16 mg via INTRAVENOUS

## 2012-04-14 MED ORDER — FAMOTIDINE IN NACL 20-0.9 MG/50ML-% IV SOLN
20.0000 mg | Freq: Once | INTRAVENOUS | Status: AC
  Administered 2012-04-14: 20 mg via INTRAVENOUS

## 2012-04-14 MED ORDER — DIPHENHYDRAMINE HCL 50 MG/ML IJ SOLN
25.0000 mg | Freq: Once | INTRAMUSCULAR | Status: AC
  Administered 2012-04-14: 25 mg via INTRAVENOUS

## 2012-04-14 MED ORDER — SODIUM CHLORIDE 0.9 % IJ SOLN
100.0000 ug | Freq: Once | INTRAVENOUS | Status: AC
  Administered 2012-04-14: 0.1 mg via INTRADERMAL
  Filled 2012-04-14: qty 0.01

## 2012-04-14 NOTE — Telephone Encounter (Signed)
CANCEL injection on 9/18; Chest CT late Nov/early Dec   07-06-2012 scan arrival time 12:45pm  Patient will have labs before ct scan  Left patient left voice message to inform the patient of the new date and time

## 2012-04-14 NOTE — Progress Notes (Signed)
At approximately 1250 Yetta Glassman RN observed a redness at the [place] test dose site.  Concha Norway, Pharm D assessed pt's right forearm.  He felt it was okay to proceed with carboplatin dose, it appears to be a skin irritation.

## 2012-04-14 NOTE — Patient Instructions (Addendum)
[facility] Discharge Instructions for Patients Receiving Chemotherapy  Today you received the following chemotherapy agents Taxol and Carboplatin.  To help prevent nausea and vomiting after your treatment, we encourage you to take your nausea medication as prescribed.   If you develop nausea and vomiting that is not controlled by your nausea medication, call the clinic. If it is after clinic hours your family physician or the after hours number for the clinic or go to the Emergency Department.   BELOW ARE SYMPTOMS THAT SHOULD BE REPORTED IMMEDIATELY:  *FEVER GREATER THAN 100.5 F  *CHILLS WITH OR WITHOUT FEVER  NAUSEA AND VOMITING THAT IS NOT CONTROLLED WITH YOUR NAUSEA MEDICATION  *UNUSUAL SHORTNESS OF BREATH  *UNUSUAL BRUISING OR BLEEDING  TENDERNESS IN MOUTH AND THROAT WITH OR WITHOUT PRESENCE OF ULCERS  *URINARY PROBLEMS  *BOWEL PROBLEMS  UNUSUAL RASH Items with * indicate a potential emergency and should be followed up as soon as possible.  One of the nurses will contact you 24 hours after your treatment. Please let the nurse know about any problems that you may have experienced. Feel free to call the clinic you have any questions or concerns. The clinic phone number is [phone].   I have been informed and understand all the instructions given to me. I know to contact the clinic, my physician, or go to the Emergency Department if any problems should occur. I do not have any questions at this time, but understand that I may call the clinic during office hours   should I have any questions or need assistance in obtaining follow up care.    __________________________________________  _____________  __________ Signature of Patient or Authorized Representative            Date                   Time    __________________________________________ Nurse's Signature    

## 2012-04-14 NOTE — Progress Notes (Signed)
ID: SHAKERRA RED   DOB: Apr 17, 1941  MR#: [id number]  CSN#:[id number]  HISTORY OF PRESENT ILLNESS: [name] [name] is a 71 year-old [place] woman I followed remotely for a history of breast cancer. More recently she noted that her belly was getting "bigger". Gradually she has developed increasing low back pain. After a period of several months, as her symptoms increased, she brought this problem to her primary physician's attention and a KUB was obtained on 10/07/2011, which was unremarkable. This was followed by a CT scan 10/15/2011, which showed ascites and omental caking. Paracentesis 10/18/2011 showed ([id number]) malignant cells consistent with metastatic adenocarcinoma, with equivocal staining for estrogen receptor. The pattern of additional stains was most suggestive of a primary gynecologic tumor. CA 125 at this time was 2051.  The patient was referred to gynecologic oncology and on 10/29/2011 underwent exploratory laparotomy under Drs. Cleda Mccreedy and Antionette Char. This consisted of a total abdominal hysterectomy with bilateral salpingo-oophorectomy, omentectomy, and radical tumor debulking. Unfortunately there was significant tumor attached to the diaphragm so the patient was suboptimally debulked. Accordingly a peritoneal catheter was not placed. GYN-ONC recommended 6 cycles of carboplatin/paclitaxel given every 3 weeks, with Neulasta on day 2 for granulocyte support. [name] [name]'s subsequent history is as detailed below.  INTERVAL HISTORY:  [name] [name] returns today for followup of her ovarian carcinoma. Today is day 1 cycle 7 of 8 planned cycles of q. three-week carboplatin/paclitaxel    Interval history is remarkable for [name] [name] having had a restaging PET scan on 04/02/2012. She was seen by Dr. Nelly Rout last week to review these results. There was no definite sign of residual or metastatic disease in the neck, chest, abdomen, or pelvis. Needless to say, [name] [name] was thrilled with these results!  She is now ready to complete her 2 final doses of chemotherapy.   REVIEW OF SYSTEMS:  [name] [name] denies any fevers or chills. She's had no rashes or skin changes. She denies abnormal bleeding or bruising. No nausea or emesis, and no change in bowel or bladder habits. She continues to have shortness of breath which is stable. She has an occasional cough but no phlegm production. No abnormal headaches, dizziness, gait disturbance, visual changes, or seizure activity. No slurred speech. There is no focal weakness. She denies any abdominal pain, pelvic pain, or unusual myalgias or arthralgias. No peripheral swelling. No signs of peripheral neuropathy in either the upper or lower extremities.  A detailed review of systems is otherwise noncontributory.   PAST MEDICAL HISTORY: Past Medical History  Diagnosis Date  . History of breast cancer   . COPD (chronic obstructive pulmonary disease)   . GERD (gastroesophageal reflux disease)   . Diabetes mellitus     type II  . Hx of colonic polyps   . Osteoarthritis   . MVP (mitral valve prolapse)   . Dysrhythmia     pt states runs a little fast   . Shortness of breath     with exertion   . Depression   . Anxiety   . Heart murmur   . Cancer     left breast  . Ovarian cancer     active chemotherapy    PAST SURGICAL HISTORY: Past Surgical History  Procedure Date  . Cesarean section 1980, 1982    X 2   . Laparoscopy     work up for infertility  . Tympanoplasty     left X 2  . Left breast fibroadenoma removal 1960's  . Breast cyst aspirations   .  Left modified radial mastectomy   . Laparotomy 10/29/2011    Procedure: EXPLORATORY LAPAROTOMY;  Surgeon: Rejeana Brock A. Duard Brady, MD;  Location: [facility];  Service: Gynecology;  Laterality: N/A;  . Abdominal hysterectomy 10/29/2011    Procedure: HYSTERECTOMY ABDOMINAL;  Surgeon: Rejeana Brock A. Duard Brady, MD;  Location: [facility];  Service: Gynecology;  Laterality: N/A;  Total abdominal hysterectomy  . Salpingoophorectomy  10/29/2011    Procedure: SALPINGO OOPHERECTOMY;  Surgeon: Rejeana Brock A. Duard Brady, MD;  Location: [facility];  Service: Gynecology;  Laterality: Bilateral;    FAMILY HISTORY Family History  Problem Relation Age of Onset  . Cancer Mother     endometrial/ colon  . Cancer Brother     colon  . COPD Other   . Hypertension Other   . Cancer Maternal Uncle     prostate/bladder/lung  . Cancer Paternal Aunt     unknown abdominal cancer  . Cancer Maternal Grandmother     Salivary gland cancer  . Cancer Paternal Aunt     leukemia   the patient's father died at the age of 82. The patient's mother died at the age of 74 she had a history of colon cancer and endometrial cancer. The patient has no sisters. She has one brother, with a history of colon cancer. There is no history of breast cancer in the family. The patient is scheduled for genetic testing later this month.  GYNECOLOGIC HISTORY: She had menarche age 30. First pregnancy to term was at age 24. She is GX T2. She became menopausal at the time of her chemotherapy for breast cancer. This was age 62. 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 Her husband [name] runs a Psychologist, sport and exercise out of their own home. Daughter [name], 62,  currently lives at home with the patient. There are some issues relating to this daughter that the patient discussed briefly. Dauhter. [name], 30, is an ED nurse at [employer]. The patient has no grandchildren. She is not a Advice worker.   ADVANCED DIRECTIVES: not in place  HEALTH MAINTENANCE: History  Substance Use Topics  . Smoking status: Current Everyday Smoker -- 0.5 packs/day for 30 years    Types: Cigarettes  . Smokeless tobacco: Never Used   Comment: quit again 2009; restarted 1 ppd 2011  . Alcohol Use: No     Colonoscopy: 2009  PAP: s/p hysterectomy  Bone density: 2010. "good"  Mammography: 2012 NOV--unremarkable  No Known  Allergies  Current Outpatient Prescriptions  Medication Sig Dispense Refill  . acetaminophen (TYLENOL) 325 MG tablet Take 650 mg by mouth every 6 (six) hours as needed. For pain      . albuterol-ipratropium (COMBIVENT) 18-103 MCG/ACT inhaler Inhale 2 puffs into the lungs every 4 (four) hours as needed. For wheezing      . atenolol (TENORMIN) 25 MG tablet Take 1 tablet (25 mg total) by mouth daily. 1/2 tab bid  30 tablet  11  . b complex vitamins tablet Take 1 tablet by mouth daily.        . budesonide-formoterol (SYMBICORT) 160-4.5 MCG/ACT inhaler Inhale 2 puffs into the lungs 2 (two) times daily.  1 Inhaler  5  . buPROPion (WELLBUTRIN SR) 150 MG 12 hr tablet Take 150 mg by mouth 2 (two) times daily.       . busPIRone (BUSPAR) 15 MG tablet Take 15 mg by mouth 2 (two) times daily.      . cholecalciferol (VITAMIN D) 1000  UNITS tablet Take 1,000 Units by mouth daily.        . clopidogrel (PLAVIX) 75 MG tablet Take 1 tablet (75 mg total) by mouth daily with breakfast.  60 tablet  3  . dexamethasone (DECADRON) 4 MG tablet Take 8 mg by mouth 2 (two) times daily with a meal. Take 2 tablets by mouth two times a day starting the day after chemotherapy for 3 days.      . diazepam (VALIUM) 10 MG tablet 1 po 1 hr before procedure, then 1 po at time of procedure  2 tablet  0  . diazepam (VALIUM) 5 MG tablet Take 1-2 tablets (5-10 mg total) by mouth at bedtime as needed. For sleep  30 tablet  1  . Docusate Sodium (COLACE PO) Take 1 each by mouth daily as needed. For constipation  After treatments only      . ferrous sulfate 325 (65 FE) MG tablet Take 325 mg by mouth daily with breakfast.      . fish oil-omega-3 fatty acids 1000 MG capsule Take 1 g by mouth daily.      Marland Kitchen ipratropium-albuterol (DUONEB) 0.5-2.5 (3) MG/3ML SOLN Inhale 3 mLs into the lungs 3 (three) times daily.       Marland Kitchen lidocaine-prilocaine (EMLA) cream Apply 1 application topically as needed. For treatments      . Linaclotide (LINZESS) 290 MCG  CAPS Take 1 each by mouth daily as needed.  30 capsule  11  . loratadine (CLARITIN) 10 MG tablet Take 10 mg by mouth daily.      Marland Kitchen lovastatin (MEVACOR) 40 MG tablet Take 40 mg by mouth daily.      . metFORMIN (GLUCOPHAGE) 1000 MG tablet Take 1 tablet (1,000 mg total) by mouth 2 (two) times daily with a meal.  180 tablet  3  . omeprazole (PRILOSEC) 40 MG capsule Take 1 capsule (40 mg total) by mouth daily.  90 capsule  3  . ondansetron (ZOFRAN) 8 MG tablet Take 8 mg by mouth every 12 (twelve) hours as needed. For nausea      . oxymetazoline (AFRIN) 0.05 % nasal spray Place 2 sprays into the nose 2 (two) times daily as needed. Allergies       . prochlorperazine (COMPAZINE) 10 MG tablet Take 10 mg by mouth every 6 (six) hours as needed. For nausea/vomiting      . sertraline (ZOLOFT) 100 MG tablet Take 100 mg by mouth daily before breakfast.      . DISCONTD: busPIRone (BUSPAR) 15 MG tablet Take 1 tablet (15 mg total) by mouth 2 (two) times daily.  180 tablet  1  . DISCONTD: roflumilast (DALIRESP) 500 MCG TABS tablet Take 500 mcg by mouth daily.          OBJECTIVE:   Filed Vitals:   04/14/12 1043  BP: 119/69  Pulse: 101  Temp: 98.4 F (36.9 C)  Resp: 20     Body mass index is 21.56 kg/(m^2).    ECOG FS: 1  Filed Weights   04/14/12 1043  Weight: 127 lb 9.6 oz (57.879 kg)   Physical Exam: middle-aged white woman in no acute distress HEENT:  Sclerae anicteric  Oropharynx clear.  Nodes:  No cervical, supraclavicular, or axillary lymphadenopathy palpated.  Breast Exam:  Deferred  Lungs:  Clear to auscultation bilaterally, although breath sounds are slightly diminished bibasilar  Heart:  Regular rate and rhythm.   Abdomen:  Soft, nontender, nondistended.   Positive bowel sounds.  Musculoskeletal:  No focal spinal tenderness to palpation.  Extremities:  No peripheral edema.   Neuro:  Nonfocal. Patient is alert and oriented x3. Speech was clear, not slurred. No facial droop noted.   LAB  RESULTS: Lab Results  Component Value Date   WBC 10.6* 04/14/2012   NEUTROABS 7.7* 04/14/2012   HGB 11.5* 04/14/2012   HCT 34.2* 04/14/2012   MCV 93.4 04/14/2012   PLT 268 04/14/2012      Chemistry      Component Value Date/Time   NA 136 03/25/2012 1011   K 4.0 03/25/2012 1011   CL 100 03/25/2012 1011   CO2 24 03/25/2012 1011   BUN 13 03/25/2012 1011   CREATININE 0.64 03/25/2012 1011      Component Value Date/Time   CALCIUM 9.7 03/25/2012 1011   ALKPHOS 114 03/25/2012 1011   AST 15 03/25/2012 1011   ALT 16 03/25/2012 1011   BILITOT 0.2* 03/25/2012 1011     Results for [name] [name], BLOYER (MRN [id number]) as of 03/25/2012 12:33  Ref. Range 11/15/2011 10:40 12/02/2011 15:15 01/14/2012 10:55 02/25/2012 11:10 03/11/2012 13:18  CA 125 Latest Range: 0.0-30.2 U/mL 838.5 (H) 496.1 (H) 118.9 (H) 46.9 (H) 45.1 (H)   STUDIES:  Nm Pet Image Restag (ps) Skull Base To Thigh  04/02/2012  *RADIOLOGY REPORT*  Clinical Data: Subsequent treatment strategy for ovarian cancer. Restaging scan  NUCLEAR MEDICINE PET SKULL BASE TO THIGH  Fasting Blood Glucose:  120  Technique:  14.1 mCi F-18 FDG was injected intravenously. CT data was obtained and used for attenuation correction and anatomic localization only.  (This was not acquired as a diagnostic CT examination.) Additional exam technical data entered on technologist worksheet.  Comparison:  CT of the abdomen and pelvis 10/15/2011.  Findings:  Neck: No hypermetabolic lymph nodes in the neck.  Chest:  No hypermetabolic mediastinal or hilar nodes.  In the anterior aspect of the left upper lobe there is a 6 mm subpleural nodule, without associated hypermetabolic activity (this is likely well below the resolution of PET imaging, however). No other larger more suspicious appearing pulmonary nodules or masses are otherwise identified. There is atherosclerosis of the thoracic aorta, the great vessels of the mediastinum and the coronary arteries, including calcified atherosclerotic  plaque in the left anterior descending and right coronary arteries. Extensive calcifications of the mitral annulus.  Right internal jugular single lumen Port-A- Cath with tip terminating in the distal superior vena cava.Status post left-sided modified radical mastectomy with axillary nodal dissection.  No definite focal soft tissue mass is identified along the left chest wall to suggest local recurrence of disease.  Abdomen/Pelvis:  No abnormal hypermetabolic activity within the liver, pancreas, adrenal glands, or spleen.  No hypermetabolic lymph nodes in the abdomen or pelvis. No definite focal peritoneal soft tissue masses are identified.  No significant volume of ascites.  Postoperative changes of omentectomy are noted.  The patient is also status post total abdominal hysterectomy and bilateral salpingo-oophorectomy.  Extensive atherosclerosis of the abdominal and pelvic vasculature, without definite aneurysm.  [place]:  On the CT portion of the examination and there are no definite aggressive appearing lytic or blastic lesions in the visualized portions of the skeleton.  However, on the PET portion of the examination there is widespread hypermetabolic activity throughout all aspects of the axial and appendicular skeleton.  The overall appearance is most compatible with a marrow stimulation (per review of records, the patient has been on Neulasta).  IMPRESSION: 1.  Diffuse low level hypermetabolic  activity throughout the visualized axial and appendicular skeleton, without corresponding aggressive appearing bony lesions on the CT portion of the examination.  This appearance is most characteristic for marrow stimulation, which is consistent with the patient's history of Neulasta treatment. 2.  No definite signs of residual or metastatic disease in the neck, chest, abdomen or pelvis on today's examination. 3.  There is, however, a nonspecific 6 mm subpleural nodule in the anterior aspect of the left upper lobe  (image 60 to of series 2). This warrants attention on follow-up studies, as a solitary metastasis or primary lung lesion is not excluded, although small lesions in subpleural locations like this are commonly benign subpleural lymph nodes. 4.  Status post left modified radical mastectomy and axillary nodal dissection, without evidence of recurrent disease. 5. Atherosclerosis, including left anterior descending and right coronary artery disease. Assessment for potential risk factor modification, dietary therapy or pharmacologic therapy may be warranted, if clinically indicated.   Original Report Authenticated By: Florencia Reasons, M.D.      ASSESSMENT: 71 y.o.  [place] woman  (1) status post left modified radical mastectomy October 1993 for a T2 N0 invasive ductal breast, estrogen and progesterone receptor positive cancer treated adjuvantly with MFL (methotrexate/fluorouracil/leucovorin) chemotherapy x6, completed April 1994, followed by 5 years of tamoxifen completed April 1999, with no evidence of recurrence  (2) now status post TAH BSO, omentectomy, and suboptimal debulking 10/29/2011 for a high-grade serous adenocarcinoma of the ovary, pT3c NX (Stage IIIC) receiving chemotherapy, 8 cycle of every three-week carboplatin/paclitaxel, with Neulasta on day 2 for granulocyte support.  The goal is to proceed to at least 2 cycles beyond normalization of CA 125.  (3) COPD with ongoing tobacco abuse  (4) diabetes mellitus  (5)  BRCA 1 and BRCA 2 negative with a "genetic variant of uncertain significance"; additional genetic testing for Lynch syndrome pending.  (6)  status post hospitalization in July 2013 for stroke/small acute infarcts in the left sub insular white matter and coronal radiata.  PLAN:  [name] [name] will proceed to treatment today as scheduled for day 1 cycle 7 of carboplatin/paclitaxel. Again, the plan is to complete 2 additional cycles for a total of 8. I will see her next week for  assessment of chemotoxicity.  Also, per Dr. Forrestine Him suggestion, we will repeat a chest CT in approximately 3 months to reassess the small subpleural nodule noted on the recent PET scan. This is thought to be benign.  [name] [name] voices understanding and agreement with our plan thus far, and will call with any changes or problems.   [name] [name]    04/14/2012

## 2012-04-15 ENCOUNTER — Other Ambulatory Visit: Payer: Medicare Other | Admitting: Lab

## 2012-04-15 ENCOUNTER — Ambulatory Visit: Payer: Medicare Other

## 2012-04-15 ENCOUNTER — Ambulatory Visit (HOSPITAL_BASED_OUTPATIENT_CLINIC_OR_DEPARTMENT_OTHER): Payer: Medicare Other

## 2012-04-15 ENCOUNTER — Ambulatory Visit: Payer: Medicare Other | Admitting: Physician Assistant

## 2012-04-15 VITALS — BP 106/62 | HR 94 | Temp 97.3°F

## 2012-04-15 DIAGNOSIS — C569 Malignant neoplasm of unspecified ovary: Secondary | ICD-10-CM

## 2012-04-15 DIAGNOSIS — K5900 Constipation, unspecified: Secondary | ICD-10-CM

## 2012-04-15 LAB — CA 125: CA 125: 39.7 U/mL — ABNORMAL HIGH (ref 0.0–30.2)

## 2012-04-15 MED ORDER — PEGFILGRASTIM INJECTION 6 MG/0.6ML
6.0000 mg | Freq: Once | SUBCUTANEOUS | Status: AC
  Administered 2012-04-15: 6 mg via SUBCUTANEOUS
  Filled 2012-04-15: qty 0.6

## 2012-04-16 ENCOUNTER — Ambulatory Visit: Payer: Medicare Other

## 2012-04-19 ENCOUNTER — Other Ambulatory Visit: Payer: Self-pay | Admitting: Internal Medicine

## 2012-04-20 ENCOUNTER — Other Ambulatory Visit: Payer: Self-pay | Admitting: General Practice

## 2012-04-20 MED ORDER — IPRATROPIUM-ALBUTEROL 18-103 MCG/ACT IN AERO: 2.0000 | INHALATION_SPRAY | RESPIRATORY_TRACT | Status: DC | PRN

## 2012-04-21 ENCOUNTER — Encounter: Payer: Self-pay | Admitting: Physician Assistant

## 2012-04-21 ENCOUNTER — Other Ambulatory Visit (HOSPITAL_BASED_OUTPATIENT_CLINIC_OR_DEPARTMENT_OTHER): Payer: Medicare Other | Admitting: Lab

## 2012-04-21 ENCOUNTER — Ambulatory Visit (HOSPITAL_BASED_OUTPATIENT_CLINIC_OR_DEPARTMENT_OTHER): Payer: Medicare Other | Admitting: Physician Assistant

## 2012-04-21 ENCOUNTER — Other Ambulatory Visit: Payer: Self-pay | Admitting: General Practice

## 2012-04-21 ENCOUNTER — Telehealth: Payer: Self-pay | Admitting: *Deleted

## 2012-04-21 ENCOUNTER — Telehealth: Payer: Self-pay | Admitting: Internal Medicine

## 2012-04-21 VITALS — BP 103/67 | HR 99 | Temp 98.4°F | Resp 20 | Ht 64.5 in | Wt 129.5 lb

## 2012-04-21 DIAGNOSIS — C569 Malignant neoplasm of unspecified ovary: Secondary | ICD-10-CM

## 2012-04-21 DIAGNOSIS — Z853 Personal history of malignant neoplasm of breast: Secondary | ICD-10-CM

## 2012-04-21 LAB — CBC WITH DIFFERENTIAL/PLATELET
BASO%: 0.1 % (ref 0.0–2.0)
LYMPH%: 10 % — ABNORMAL LOW (ref 14.0–49.7)
MCHC: 33.1 g/dL (ref 31.5–36.0)
MCV: 96.9 fL (ref 79.5–101.0)
MONO#: 1.5 10*3/uL — ABNORMAL HIGH (ref 0.1–0.9)
MONO%: 4.8 % (ref 0.0–14.0)
Platelets: 241 10*3/uL (ref 145–400)
RBC: 3.69 10*6/uL — ABNORMAL LOW (ref 3.70–5.45)
RDW: 16.1 % — ABNORMAL HIGH (ref 11.2–14.5)
WBC: 30.7 10*3/uL — ABNORMAL HIGH (ref 3.9–10.3)

## 2012-04-21 MED ORDER — IPRATROPIUM-ALBUTEROL 18-103 MCG/ACT IN AERO: 2.0000 | INHALATION_SPRAY | RESPIRATORY_TRACT | Status: DC | PRN

## 2012-04-21 NOTE — Telephone Encounter (Signed)
Pharmacy needs a hardcopy for the Duoneb faxed in with the diagnosis for this medication in order for patients insurance to cover

## 2012-04-21 NOTE — Progress Notes (Signed)
ID: [name] SKLENAR   DOB: 05-Feb-1941  MR#: [id number]  CSN#:[id number]  HISTORY OF PRESENT ILLNESS: [name] [name] is a 71 year-old [name] [name] I followed remotely for a history of breast cancer. More recently she noted that her belly was getting "bigger". Gradually she has developed increasing low back pain. After a period of several months, as her symptoms increased, she brought this problem to her primary physician's attention and a KUB was obtained on 10/07/2011, which was unremarkable. This was followed by a CT scan 10/15/2011, which showed ascites and omental caking. Paracentesis 10/18/2011 showed ([id number]) malignant cells consistent with metastatic adenocarcinoma, with equivocal staining for estrogen receptor. The pattern of additional stains was most suggestive of a primary gynecologic tumor. CA 125 at this time was 2051.  The patient was referred to gynecologic oncology and on 10/29/2011 underwent exploratory laparotomy under Drs. Cleda Mccreedy and Antionette Char. This consisted of a total abdominal hysterectomy with bilateral salpingo-oophorectomy, omentectomy, and radical tumor debulking. Unfortunately there was significant tumor attached to the diaphragm so the patient was suboptimally debulked. Accordingly a peritoneal catheter was not placed. GYN-ONC recommended 6 cycles of carboplatin/paclitaxel given every 3 weeks, with Neulasta on day 2 for granulocyte support. [name]'s subsequent history is as detailed below.  INTERVAL HISTORY:  [name] [name] returns today for followup of her ovarian carcinoma. Today is day 8 cycle 7 of 8 planned cycles of q. three-week carboplatin/paclitaxel    Interval history is generally unremarkable. [name] tells me the chemotherapy side effects are "catching up" with her. She tells me it is taking a little while her to recover following each cycle. The Neulasta injection seems to be causing more bony aches and pains, and these last a little longer. Following cycle  7, she had pain in her hips for approximately 5 days, but fortunately this has now resolved.  REVIEW OF SYSTEMS:  [name] denies any fevers or chills. She's had no rashes or skin changes. She denies abnormal bleeding or bruising. No mouth ulcers or oral sensitivity. No nausea or emesis, and no change in bowel or bladder habits. She continues to have shortness of breath which is stable. She has an occasional cough but no phlegm production. No abnormal headaches, dizziness, gait disturbance, visual changes, or seizure activity. No slurred speech. There is no focal weakness. She denies any abdominal pain, pelvic pain, or unusual myalgias or arthralgias. No peripheral swelling. No signs of peripheral neuropathy in either the upper or lower extremities.  A detailed review of systems is otherwise noncontributory.   PAST MEDICAL HISTORY: Past Medical History  Diagnosis Date  . History of breast cancer   . COPD (chronic obstructive pulmonary disease)   . GERD (gastroesophageal reflux disease)   . Diabetes mellitus     type II  . Hx of colonic polyps   . Osteoarthritis   . MVP (mitral valve prolapse)   . Dysrhythmia     pt states runs a little fast   . Shortness of breath     with exertion   . Depression   . Anxiety   . Heart murmur   . Cancer     left breast  . Ovarian cancer     active chemotherapy    PAST SURGICAL HISTORY: Past Surgical History  Procedure Date  . Cesarean section 1980, 1982    X 2   . Laparoscopy     work up for infertility  . Tympanoplasty     left X 2  . Left breast fibroadenoma  removal 1960's  . Breast cyst aspirations   . Left modified radial mastectomy   . Laparotomy 10/29/2011    Procedure: EXPLORATORY LAPAROTOMY;  Surgeon: Rejeana Brock A. Duard Brady, MD;  Location: [facility];  Service: Gynecology;  Laterality: N/A;  . Abdominal hysterectomy 10/29/2011    Procedure: HYSTERECTOMY ABDOMINAL;  Surgeon: Rejeana Brock A. Duard Brady, MD;  Location: [facility];  Service: Gynecology;   Laterality: N/A;  Total abdominal hysterectomy  . Salpingoophorectomy 10/29/2011    Procedure: SALPINGO OOPHERECTOMY;  Surgeon: Rejeana Brock A. Duard Brady, MD;  Location: [facility];  Service: Gynecology;  Laterality: Bilateral;    FAMILY HISTORY Family History  Problem Relation Age of Onset  . Cancer Mother     endometrial/ colon  . Cancer Brother     colon  . COPD Other   . Hypertension Other   . Cancer Maternal Uncle     prostate/bladder/lung  . Cancer Paternal Aunt     unknown abdominal cancer  . Cancer Maternal Grandmother     Salivary gland cancer  . Cancer Paternal Aunt     leukemia   the patient's father died at the age of 78. The patient's mother died at the age of 57 she had a history of colon cancer and endometrial cancer. The patient has no sisters. She has one brother, with a history of colon cancer. There is no history of breast cancer in the family. The patient is scheduled for genetic testing later this month.  GYNECOLOGIC HISTORY: She had menarche age 72. First pregnancy to term was at age 84. She is GX T2. She became menopausal at the time of her chemotherapy for breast cancer. This was age 65. 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 Her husband [name] runs a Psychologist, sport and exercise out of their own home. Daughter [name], 34,  currently lives at home with the patient. There are some issues relating to this daughter that the patient discussed briefly. Dauhter. [name], 30, is an ED nurse at [employer]. The patient has no grandchildren. She is not a Advice worker.   ADVANCED DIRECTIVES: not in place  HEALTH MAINTENANCE: History  Substance Use Topics  . Smoking status: Current Every Day Smoker -- 0.5 packs/day for 30 years    Types: Cigarettes  . Smokeless tobacco: Never Used   Comment: quit again 2009; restarted 1 ppd 2011  . Alcohol Use: No     Colonoscopy: 2009  PAP: s/p hysterectomy  Bone density: 2010.  "good"  Mammography: 2012 NOV--unremarkable  No Known Allergies  Current Outpatient Prescriptions  Medication Sig Dispense Refill  . acetaminophen (TYLENOL) 325 MG tablet Take 650 mg by mouth every 6 (six) hours as needed. For pain      . albuterol-ipratropium (COMBIVENT) 18-103 MCG/ACT inhaler Inhale 2 puffs into the lungs every 4 (four) hours as needed. For wheezing  14.7 Inhaler  3  . atenolol (TENORMIN) 25 MG tablet Take 1 tablet (25 mg total) by mouth daily. 1/2 tab bid  30 tablet  11  . b complex vitamins tablet Take 1 tablet by mouth daily.        . budesonide-formoterol (SYMBICORT) 160-4.5 MCG/ACT inhaler Inhale 2 puffs into the lungs 2 (two) times daily.  1 Inhaler  5  . buPROPion (WELLBUTRIN SR) 150 MG 12 hr tablet Take 150 mg by mouth 2 (two) times daily.       . busPIRone (BUSPAR) 15 MG tablet Take 15 mg by mouth 2 (two)  times daily.      . cholecalciferol (VITAMIN D) 1000 UNITS tablet Take 1,000 Units by mouth daily.        . clopidogrel (PLAVIX) 75 MG tablet Take 1 tablet (75 mg total) by mouth daily with breakfast.  60 tablet  3  . dexamethasone (DECADRON) 4 MG tablet Take 8 mg by mouth 2 (two) times daily with a meal. Take 2 tablets by mouth two times a day starting the day after chemotherapy for 3 days.      . diazepam (VALIUM) 10 MG tablet 1 po 1 hr before procedure, then 1 po at time of procedure  2 tablet  0  . diazepam (VALIUM) 5 MG tablet Take 1-2 tablets (5-10 mg total) by mouth at bedtime as needed. For sleep  30 tablet  1  . Docusate Sodium (COLACE PO) Take 1 each by mouth daily as needed. For constipation  After treatments only      . ferrous sulfate 325 (65 FE) MG tablet Take 325 mg by mouth daily with breakfast.      . fish oil-omega-3 fatty acids 1000 MG capsule Take 1 g by mouth daily.      Marland Kitchen ipratropium-albuterol (DUONEB) 0.5-2.5 (3) MG/3ML SOLN Inhale 3 mLs into the lungs 3 (three) times daily.       Marland Kitchen ipratropium-albuterol (DUONEB) 0.5-2.5 (3) MG/3ML SOLN  INHALE 1 VIAL VIA HAND HELD NEBULIZER 4 TIMES A DAY  3 mL  3  . lidocaine-prilocaine (EMLA) cream Apply 1 application topically as needed. For treatments      . Linaclotide (LINZESS) 290 MCG CAPS Take 1 each by mouth daily as needed.  30 capsule  11  . loratadine (CLARITIN) 10 MG tablet Take 10 mg by mouth daily.      Marland Kitchen lovastatin (MEVACOR) 40 MG tablet Take 40 mg by mouth daily.      . metFORMIN (GLUCOPHAGE) 1000 MG tablet Take 1 tablet (1,000 mg total) by mouth 2 (two) times daily with a meal.  180 tablet  3  . omeprazole (PRILOSEC) 40 MG capsule Take 1 capsule (40 mg total) by mouth daily.  90 capsule  3  . ondansetron (ZOFRAN) 8 MG tablet Take 8 mg by mouth every 12 (twelve) hours as needed. For nausea      . oxymetazoline (AFRIN) 0.05 % nasal spray Place 2 sprays into the nose 2 (two) times daily as needed. Allergies       . prochlorperazine (COMPAZINE) 10 MG tablet Take 10 mg by mouth every 6 (six) hours as needed. For nausea/vomiting      . sertraline (ZOLOFT) 100 MG tablet Take 100 mg by mouth daily before breakfast.      . DISCONTD: albuterol-ipratropium (COMBIVENT) 18-103 MCG/ACT inhaler Inhale 2 puffs into the lungs every 4 (four) hours as needed. For wheezing  1 Inhaler  3  . DISCONTD: busPIRone (BUSPAR) 15 MG tablet Take 1 tablet (15 mg total) by mouth 2 (two) times daily.  180 tablet  1  . DISCONTD: roflumilast (DALIRESP) 500 MCG TABS tablet Take 500 mcg by mouth daily.          OBJECTIVE:   Filed Vitals:   04/21/12 1129  BP: 103/67  Pulse: 99  Temp: 98.4 F (36.9 C)  Resp: 20     Body mass index is 21.89 kg/(m^2).    ECOG FS: 1  Filed Weights   04/21/12 1129  Weight: 129 lb 8 oz (58.741 kg)   Physical Exam: middle-aged  white [name] in no acute distress HEENT:  Sclerae anicteric  Oropharynx clear.  Nodes:  No cervical, supraclavicular, or axillary lymphadenopathy palpated.  Breast Exam:  Deferred  Lungs:  Clear to auscultation bilaterally, although breath sounds are  slightly diminished bibasilar  Heart:  Regular rate and rhythm.   Abdomen:  Soft, nontender, nondistended.   Positive bowel sounds.  Musculoskeletal:  No focal spinal tenderness to palpation.  Extremities:  No peripheral edema.   Neuro:  Nonfocal. Patient is alert and oriented x3. Speech was clear, not slurred. No facial droop noted.   LAB RESULTS: Lab Results  Component Value Date   WBC 30.7* 04/21/2012   NEUTROABS 26.0* 04/21/2012   HGB 11.8 04/21/2012   HCT 35.7 04/21/2012   MCV 96.9 04/21/2012   PLT 241 04/21/2012      Chemistry      Component Value Date/Time   NA 138 04/14/2012 1031   NA 136 03/25/2012 1011   K 4.0 04/14/2012 1031   K 4.0 03/25/2012 1011   CL 104 04/14/2012 1031   CL 100 03/25/2012 1011   CO2 24 04/14/2012 1031   CO2 24 03/25/2012 1011   BUN 15.0 04/14/2012 1031   BUN 13 03/25/2012 1011   CREATININE 0.8 04/14/2012 1031   CREATININE 0.64 03/25/2012 1011      Component Value Date/Time   CALCIUM 9.6 04/14/2012 1031   CALCIUM 9.7 03/25/2012 1011   ALKPHOS 122 04/14/2012 1031   ALKPHOS 114 03/25/2012 1011   AST 15 04/14/2012 1031   AST 15 03/25/2012 1011   ALT 18 04/14/2012 1031   ALT 16 03/25/2012 1011   BILITOT 0.40 04/14/2012 1031   BILITOT 0.2* 03/25/2012 1011     Results for LYLLIANA, KITAMURA (MRN [id number]) as of 03/25/2012 12:33  Ref. Range 11/15/2011 10:40 12/02/2011 15:15 01/14/2012 10:55 02/25/2012 11:10 03/11/2012 13:18  CA 125 Latest Range: 0.0-30.2 U/mL 838.5 (H) 496.1 (H) 118.9 (H) 46.9 (H) 45.1 (H)   STUDIES:  Nm Pet Image Restag (ps) Skull Base To Thigh  04/02/2012  *RADIOLOGY REPORT*  Clinical Data: Subsequent treatment strategy for ovarian cancer. Restaging scan  NUCLEAR MEDICINE PET SKULL BASE TO THIGH  Fasting Blood Glucose:  120  Technique:  14.1 mCi F-18 FDG was injected intravenously. CT data was obtained and used for attenuation correction and anatomic localization only.  (This was not acquired as a diagnostic CT examination.) Additional exam technical data  entered on technologist worksheet.  Comparison:  CT of the abdomen and pelvis 10/15/2011.  Findings:  Neck: No hypermetabolic lymph nodes in the neck.  Chest:  No hypermetabolic mediastinal or hilar nodes.  In the anterior aspect of the left upper lobe there is a 6 mm subpleural nodule, without associated hypermetabolic activity (this is likely well below the resolution of PET imaging, however). No other larger more suspicious appearing pulmonary nodules or masses are otherwise identified. There is atherosclerosis of the thoracic aorta, the great vessels of the mediastinum and the coronary arteries, including calcified atherosclerotic plaque in the left anterior descending and right coronary arteries. Extensive calcifications of the mitral annulus.  Right internal jugular single lumen Port-A- Cath with tip terminating in the distal superior vena cava.Status post left-sided modified radical mastectomy with axillary nodal dissection.  No definite focal soft tissue mass is identified along the left chest wall to suggest local recurrence of disease.  Abdomen/Pelvis:  No abnormal hypermetabolic activity within the liver, pancreas, adrenal glands, or spleen.  No hypermetabolic lymph nodes in the  abdomen or pelvis. No definite focal peritoneal soft tissue masses are identified.  No significant volume of ascites.  Postoperative changes of omentectomy are noted.  The patient is also status post total abdominal hysterectomy and bilateral salpingo-oophorectomy.  Extensive atherosclerosis of the abdominal and pelvic vasculature, without definite aneurysm.  [place]:  On the CT portion of the examination and there are no definite aggressive appearing lytic or blastic lesions in the visualized portions of the skeleton.  However, on the PET portion of the examination there is widespread hypermetabolic activity throughout all aspects of the axial and appendicular skeleton.  The overall appearance is most compatible with a marrow  stimulation (per review of records, the patient has been on Neulasta).  IMPRESSION: 1.  Diffuse low level hypermetabolic activity throughout the visualized axial and appendicular skeleton, without corresponding aggressive appearing bony lesions on the CT portion of the examination.  This appearance is most characteristic for marrow stimulation, which is consistent with the patient's history of Neulasta treatment. 2.  No definite signs of residual or metastatic disease in the neck, chest, abdomen or pelvis on today's examination. 3.  There is, however, a nonspecific 6 mm subpleural nodule in the anterior aspect of the left upper lobe (image 60 to of series 2). This warrants attention on follow-up studies, as a solitary metastasis or primary lung lesion is not excluded, although small lesions in subpleural locations like this are commonly benign subpleural lymph nodes. 4.  Status post left modified radical mastectomy and axillary nodal dissection, without evidence of recurrent disease. 5. Atherosclerosis, including left anterior descending and right coronary artery disease. Assessment for potential risk factor modification, dietary therapy or pharmacologic therapy may be warranted, if clinically indicated.   Original Report Authenticated By: Florencia Reasons, M.D.      ASSESSMENT: 71 y.o.  [name] [name]  (1) status post left modified radical mastectomy October 1993 for a T2 N0 invasive ductal breast, estrogen and progesterone receptor positive cancer treated adjuvantly with MFL (methotrexate/fluorouracil/leucovorin) chemotherapy x6, completed April 1994, followed by 5 years of tamoxifen completed April 1999, with no evidence of recurrence  (2) now status post TAH BSO, omentectomy, and suboptimal debulking 10/29/2011 for a high-grade serous adenocarcinoma of the ovary, pT3c NX (Stage IIIC) receiving chemotherapy, 8 cycle of every three-week carboplatin/paclitaxel, with Neulasta on day 2 for  granulocyte support.  The goal is to proceed to at least 2 cycles beyond normalization of CA 125.  (3) COPD with ongoing tobacco abuse  (4) diabetes mellitus  (5)  BRCA 1 and BRCA 2 negative with a "genetic variant of uncertain significance"; additional genetic testing for Lynch syndrome pending.  (6)  status post hospitalization in July 2013 for stroke/small acute infarcts in the left sub insular white matter and coronal radiata.  PLAN:  Talaysia has one more cycle of chemotherapy planned, and is ready to complete her treatment. I will see her again with her next cycle on October 1. She was scheduled to see Dr. Darnelle Catalan on October 9, but plans to go to the beach that week. Accordingly, I will see her the following week when she returns on October 14.   Also, per Dr. Forrestine Him suggestion, we will repeat a chest CT in approximately 3 months to reassess the small subpleural nodule noted on the recent PET scan. This is thought to be benign.  We will discuss scheduling this will see her in a couple of weeks.  Keymiah voices understanding and agreement with our plan thus far, and  will call with any changes or problems.   Huyen Perazzo    04/21/2012

## 2012-04-21 NOTE — Telephone Encounter (Signed)
AB 10/14; GM in mid Nov 2013  Cancelled 05-13-2012 per orders from 04-21-2012  Patient is aware

## 2012-04-22 ENCOUNTER — Ambulatory Visit: Payer: Medicare Other

## 2012-04-22 ENCOUNTER — Other Ambulatory Visit: Payer: Medicare Other | Admitting: Lab

## 2012-04-22 ENCOUNTER — Ambulatory Visit: Payer: Medicare Other | Admitting: Physician Assistant

## 2012-04-22 MED ORDER — IPRATROPIUM-ALBUTEROL 0.5-2.5 (3) MG/3ML IN SOLN: RESPIRATORY_TRACT | Status: DC

## 2012-04-22 NOTE — Telephone Encounter (Signed)
RX printed with DX, signed and faxed

## 2012-04-23 ENCOUNTER — Ambulatory Visit: Payer: Medicare Other | Admitting: Gynecologic Oncology

## 2012-04-28 ENCOUNTER — Other Ambulatory Visit: Payer: Self-pay | Admitting: Internal Medicine

## 2012-04-30 ENCOUNTER — Ambulatory Visit: Payer: Medicare Other | Admitting: Gynecologic Oncology

## 2012-05-04 ENCOUNTER — Encounter: Payer: Self-pay | Admitting: Internal Medicine

## 2012-05-04 ENCOUNTER — Ambulatory Visit (INDEPENDENT_AMBULATORY_CARE_PROVIDER_SITE_OTHER): Payer: Medicare Other | Admitting: Internal Medicine

## 2012-05-04 VITALS — BP 120/62 | HR 92 | Temp 98.8°F | Resp 16 | Wt 129.0 lb

## 2012-05-04 DIAGNOSIS — F411 Generalized anxiety disorder: Secondary | ICD-10-CM

## 2012-05-04 DIAGNOSIS — J449 Chronic obstructive pulmonary disease, unspecified: Secondary | ICD-10-CM

## 2012-05-04 DIAGNOSIS — C569 Malignant neoplasm of unspecified ovary: Secondary | ICD-10-CM

## 2012-05-04 DIAGNOSIS — I639 Cerebral infarction, unspecified: Secondary | ICD-10-CM

## 2012-05-04 DIAGNOSIS — F419 Anxiety disorder, unspecified: Secondary | ICD-10-CM

## 2012-05-04 DIAGNOSIS — E119 Type 2 diabetes mellitus without complications: Secondary | ICD-10-CM

## 2012-05-04 DIAGNOSIS — J209 Acute bronchitis, unspecified: Secondary | ICD-10-CM

## 2012-05-04 DIAGNOSIS — I6350 Cerebral infarction due to unspecified occlusion or stenosis of unspecified cerebral artery: Secondary | ICD-10-CM

## 2012-05-04 MED ORDER — MOMETASONE FURO-FORMOTEROL FUM 200-5 MCG/ACT IN AERO: 2.0000 | INHALATION_SPRAY | Freq: Two times a day (BID) | RESPIRATORY_TRACT | Status: DC

## 2012-05-04 MED ORDER — DIAZEPAM 5 MG PO TABS: ORAL_TABLET | ORAL | Status: DC

## 2012-05-04 MED ORDER — ACLIDINIUM BROMIDE 400 MCG/ACT IN AEPB: 1.0000 | INHALATION_SPRAY | Freq: Two times a day (BID) | RESPIRATORY_TRACT | Status: DC

## 2012-05-04 NOTE — Progress Notes (Signed)
   Subjective:    Patient ID: [name] [name], female    DOB: 05-[name]-1941, 71 y.o.   MRN: [id number]  HPI  F/u CVA - no relapse, f/u DM, COPD C/o insomnia F/u abd tumor and ascitis - s/p debulking and chemo -- on Taxol now No new abd pain  Wt Readings from Last 3 Encounters:  05/04/12 129 lb (58.514 kg)  04/21/12 129 lb 8 oz (58.741 kg)  04/14/12 127 lb 9.6 oz (57.879 kg)   BP Readings from Last 3 Encounters:  05/04/12 120/62  04/21/12 103/67  04/15/12 106/62      Review of Systems  Constitutional: Positive for fatigue and unexpected weight change. Negative for chills, activity change and appetite change.  HENT: Negative for congestion, mouth sores and sinus pressure.   Eyes: Negative for visual disturbance.  Respiratory: Positive for shortness of breath. Negative for cough and chest tightness.   Gastrointestinal: Positive for diarrhea and abdominal distention. Negative for nausea and abdominal pain.  Genitourinary: Negative for frequency, difficulty urinating and vaginal pain.  Musculoskeletal: Negative for back pain and gait problem.  Skin: Negative for pallor and rash.  Neurological: Negative for dizziness, tremors, weakness, numbness and headaches.  Psychiatric/Behavioral: Negative for confusion and disturbed wake/sleep cycle.       Objective:   Physical Exam  Constitutional: She appears well-developed. No distress.  HENT:  Head: Normocephalic.  Right Ear: External ear normal.  Left Ear: External ear normal.  Nose: Nose normal.  Mouth/Throat: Oropharynx is clear and moist.  Eyes: Conjunctivae normal are normal. Pupils are equal, round, and reactive to light. Right eye exhibits no discharge. Left eye exhibits no discharge.  Neck: Normal range of motion. Neck supple. No JVD present. No tracheal deviation present. No thyromegaly present.  Cardiovascular: Normal rate, regular rhythm and normal heart sounds.   Pulmonary/Chest: No stridor. No respiratory distress. She  has no wheezes.  Abdominal: Soft. Bowel sounds are normal. She exhibits distension. She exhibits no mass. There is no tenderness. There is no rebound and no guarding.  Musculoskeletal: She exhibits no edema and no tenderness.  Lymphadenopathy:    She has no cervical adenopathy.  Neurological: She displays normal reflexes. No cranial nerve deficit. She exhibits normal muscle tone. Coordination normal.  Skin: No rash noted. No erythema.  Psychiatric: She has a normal mood and affect. Her behavior is normal. Judgment and thought content normal.   Lab Results  Component Value Date   WBC 30.7* 04/21/2012   HGB 11.8 04/21/2012   HCT 35.7 04/21/2012   PLT 241 04/21/2012   GLUCOSE 114* 04/14/2012   CHOL 170 02/22/2012   TRIG 165* 02/22/2012   HDL 50 02/22/2012   LDLDIRECT 108.0 01/16/2010   LDLCALC 87 02/22/2012   ALT 18 04/14/2012   AST 15 04/14/2012   NA 138 04/14/2012   K 4.0 04/14/2012   CL 104 04/14/2012   CREATININE 0.8 04/14/2012   BUN 15.0 04/14/2012   CO2 24 04/14/2012   TSH 3.30 08/30/2010   INR 0.94 02/21/2012   HGBA1C 7.0* 02/21/2012   MICROALBUR 1.0 08/30/2010   I personally provided the Tudorza inhaler use teaching. After the teaching patient was able to demonstrate it's use effectively. All questions were answered       Assessment & Plan:

## 2012-05-04 NOTE — Assessment & Plan Note (Signed)
Continue with current prescription therapy as reflected on the Med list. No relapse 

## 2012-05-04 NOTE — Assessment & Plan Note (Signed)
[name] [name] and [name] [name] samples

## 2012-05-04 NOTE — Assessment & Plan Note (Signed)
10/2011 s/p debulking 9/13 -- on Taxol

## 2012-05-04 NOTE — Assessment & Plan Note (Signed)
Continue with current prescription therapy as reflected on the Med list.  

## 2012-05-05 ENCOUNTER — Other Ambulatory Visit (HOSPITAL_BASED_OUTPATIENT_CLINIC_OR_DEPARTMENT_OTHER): Payer: Medicare Other | Admitting: Lab

## 2012-05-05 ENCOUNTER — Encounter: Payer: Self-pay | Admitting: Physician Assistant

## 2012-05-05 ENCOUNTER — Ambulatory Visit (HOSPITAL_BASED_OUTPATIENT_CLINIC_OR_DEPARTMENT_OTHER): Payer: Medicare Other | Admitting: Physician Assistant

## 2012-05-05 ENCOUNTER — Other Ambulatory Visit: Payer: Self-pay | Admitting: Oncology

## 2012-05-05 ENCOUNTER — Telehealth: Payer: Self-pay | Admitting: *Deleted

## 2012-05-05 ENCOUNTER — Ambulatory Visit: Payer: Medicare Other | Admitting: Physician Assistant

## 2012-05-05 ENCOUNTER — Ambulatory Visit (HOSPITAL_BASED_OUTPATIENT_CLINIC_OR_DEPARTMENT_OTHER): Payer: Medicare Other

## 2012-05-05 VITALS — BP 110/69 | HR 96 | Temp 98.8°F | Resp 20 | Ht 64.5 in | Wt 128.9 lb

## 2012-05-05 DIAGNOSIS — Z853 Personal history of malignant neoplasm of breast: Secondary | ICD-10-CM

## 2012-05-05 DIAGNOSIS — C569 Malignant neoplasm of unspecified ovary: Secondary | ICD-10-CM

## 2012-05-05 DIAGNOSIS — K5900 Constipation, unspecified: Secondary | ICD-10-CM

## 2012-05-05 DIAGNOSIS — R911 Solitary pulmonary nodule: Secondary | ICD-10-CM

## 2012-05-05 DIAGNOSIS — Z5111 Encounter for antineoplastic chemotherapy: Secondary | ICD-10-CM

## 2012-05-05 LAB — CBC WITH DIFFERENTIAL/PLATELET
BASO%: 0.8 % (ref 0.0–2.0)
EOS%: 1.1 % (ref 0.0–7.0)
MCH: 32.4 pg (ref 25.1–34.0)
MCHC: 34 g/dL (ref 31.5–36.0)
RDW: 16 % — ABNORMAL HIGH (ref 11.2–14.5)
lymph#: 2.1 10*3/uL (ref 0.9–3.3)
nRBC: 0 % (ref 0–0)

## 2012-05-05 LAB — COMPREHENSIVE METABOLIC PANEL (CC13)
AST: 16 U/L (ref 5–34)
Albumin: 4 g/dL (ref 3.5–5.0)
Alkaline Phosphatase: 125 U/L (ref 40–150)
Potassium: 4 mEq/L (ref 3.5–5.1)
Sodium: 140 mEq/L (ref 136–145)
Total Protein: 7.3 g/dL (ref 6.4–8.3)

## 2012-05-05 MED ORDER — ONDANSETRON 16 MG/50ML IVPB (CHCC)
16.0000 mg | Freq: Once | INTRAVENOUS | Status: AC
  Administered 2012-05-05: 16 mg via INTRAVENOUS

## 2012-05-05 MED ORDER — FAMOTIDINE IN NACL 20-0.9 MG/50ML-% IV SOLN
20.0000 mg | Freq: Once | INTRAVENOUS | Status: AC
  Administered 2012-05-05: 20 mg via INTRAVENOUS

## 2012-05-05 MED ORDER — SODIUM CHLORIDE 0.9 % IV SOLN
Freq: Once | INTRAVENOUS | Status: AC
  Administered 2012-05-05: 11:00:00 via INTRAVENOUS

## 2012-05-05 MED ORDER — SODIUM CHLORIDE 0.9 % IV SOLN
430.0000 mg | Freq: Once | INTRAVENOUS | Status: AC
  Administered 2012-05-05: 430 mg via INTRAVENOUS
  Filled 2012-05-05: qty 43

## 2012-05-05 MED ORDER — SODIUM CHLORIDE 0.9 % IJ SOLN
100.0000 ug | Freq: Once | INTRAVENOUS | Status: AC
  Administered 2012-05-05: 0.1 mg via INTRADERMAL
  Filled 2012-05-05: qty 0.01

## 2012-05-05 MED ORDER — PACLITAXEL CHEMO INJECTION 300 MG/50ML
175.0000 mg/m2 | Freq: Once | INTRAVENOUS | Status: AC
  Administered 2012-05-05: 276 mg via INTRAVENOUS
  Filled 2012-05-05: qty 46

## 2012-05-05 MED ORDER — DIPHENHYDRAMINE HCL 50 MG/ML IJ SOLN
25.0000 mg | Freq: Once | INTRAMUSCULAR | Status: AC
  Administered 2012-05-05: 25 mg via INTRAVENOUS

## 2012-05-05 MED ORDER — DEXAMETHASONE SODIUM PHOSPHATE 4 MG/ML IJ SOLN
20.0000 mg | Freq: Once | INTRAMUSCULAR | Status: AC
  Administered 2012-05-05: 20 mg via INTRAVENOUS

## 2012-05-05 NOTE — Patient Instructions (Signed)
No change in plan.  Return for labs and visit in 2 weeks.

## 2012-05-05 NOTE — Progress Notes (Signed)
ID: KORA GROOM   DOB: [birth year]  MR#: [id number]  CSN#:[id number]  HISTORY OF PRESENT ILLNESS: [name] [name] is a 71 year-old [place] woman I followed remotely for a history of breast cancer. More recently she noted that her belly was getting "bigger". Gradually she has developed increasing low back pain. After a period of several months, as her symptoms increased, she brought this problem to her primary physician's attention and a KUB was obtained on 10/07/2011, which was unremarkable. This was followed by a CT scan 10/15/2011, which showed ascites and omental caking. Paracentesis 10/18/2011 showed ([id number]) malignant cells consistent with metastatic adenocarcinoma, with equivocal staining for estrogen receptor. The pattern of additional stains was most suggestive of a primary gynecologic tumor. CA 125 at this time was 2051.  The patient was referred to gynecologic oncology and on 10/29/2011 underwent exploratory laparotomy under Drs. Cleda Mccreedy and Antionette Char. This consisted of a total abdominal hysterectomy with bilateral salpingo-oophorectomy, omentectomy, and radical tumor debulking. Unfortunately there was significant tumor attached to the diaphragm so the patient was suboptimally debulked. Accordingly a peritoneal catheter was not placed. GYN-ONC recommended 6 cycles of carboplatin/paclitaxel given every 3 weeks, with Neulasta on day 2 for granulocyte support. [name] [name] subsequent history is as detailed below.  INTERVAL HISTORY:  [name] [name] returns today for followup of her ovarian carcinoma. Today is day 1 cycle 8 of 8 planned cycles of q. three-week carboplatin/paclitaxel    Interval history is generally unremarkable. [name] [name] went to the [place] this past weekend which she enjoyed. She is getting ready to go to the beach next week. Overall she is "feeling well".  REVIEW OF SYSTEMS:  [name] [name] denies any fevers or chills. She's had no rashes or skin changes. She denies abnormal bleeding  or bruising. No mouth ulcers or oral sensitivity. No nausea or emesis, and no change in bowel or bladder habits. She continues to have shortness of breath which is stable. She has an occasional cough but no phlegm production. No chest pain or palpitations. No abnormal headaches, dizziness, gait disturbance, visual changes, or seizure activity. No slurred speech. There is no focal weakness. She denies any abdominal pain, pelvic pain, or unusual myalgias or arthralgias. No peripheral swelling. No signs of peripheral neuropathy in either the upper or lower extremities.  A detailed review of systems is otherwise noncontributory.   PAST MEDICAL HISTORY: Past Medical History  Diagnosis Date  . History of breast cancer   . COPD (chronic obstructive pulmonary disease)   . GERD (gastroesophageal reflux disease)   . Diabetes mellitus     type II  . Hx of colonic polyps   . Osteoarthritis   . MVP (mitral valve prolapse)   . Dysrhythmia     pt states runs a little fast   . Shortness of breath     with exertion   . Depression   . Anxiety   . Heart murmur   . Cancer     left breast  . Ovarian cancer     active chemotherapy    PAST SURGICAL HISTORY: Past Surgical History  Procedure Date  . Cesarean section 1980, 1982    X 2   . Laparoscopy     work up for infertility  . Tympanoplasty     left X 2  . Left breast fibroadenoma removal 1960's  . Breast cyst aspirations   . Left modified radial mastectomy   . Laparotomy 10/29/2011    Procedure: EXPLORATORY LAPAROTOMY;  Surgeon: Rejeana Brock A. Duard Brady, MD;  Location: [facility];  Service: Gynecology;  Laterality: N/A;  . Abdominal hysterectomy 10/29/2011    Procedure: HYSTERECTOMY ABDOMINAL;  Surgeon: Rejeana Brock A. Duard Brady, MD;  Location: [facility];  Service: Gynecology;  Laterality: N/A;  Total abdominal hysterectomy  . Salpingoophorectomy 10/29/2011    Procedure: SALPINGO OOPHERECTOMY;  Surgeon: Rejeana Brock A. Duard Brady, MD;  Location: [facility];  Service: Gynecology;   Laterality: Bilateral;    FAMILY HISTORY Family History  Problem Relation Age of Onset  . Cancer Mother     endometrial/ colon  . Cancer Brother     colon  . COPD Other   . Hypertension Other   . Cancer Maternal Uncle     prostate/bladder/lung  . Cancer Paternal Aunt     unknown abdominal cancer  . Cancer Maternal Grandmother     Salivary gland cancer  . Cancer Paternal Aunt     leukemia   the patient's father died at the age of 23. The patient's mother died at the age of 31 she had a history of colon cancer and endometrial cancer. The patient has no sisters. She has one brother, with a history of colon cancer. There is no history of breast cancer in the family. The patient is scheduled for genetic testing later this month.  GYNECOLOGIC HISTORY: She had menarche age 88. First pregnancy to term was at age 9. She is GX T2. She became menopausal at the time of her chemotherapy for breast cancer. This was age 24. 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 Her husband [name] [name] runs a Psych[employer], sport and exercise out of their own home. Daughter [name] [name], 61,  currently lives at home with the patient. There are some issues relating to this daughter that the patient discussed briefly. [place]. [name] [name], 30, is an ED nurse at [employer]. The patient has no grandchildren. She is not a Advice worker.   ADVANCED DIRECTIVES: not in place  HEALTH MAINTENANCE: History  Substance Use Topics  . Smoking status: Current Every Day Smoker -- 0.5 packs/day for 30 years    Types: Cigarettes  . Smokeless tobacco: Never Used   Comment: quit again 2009; restarted 1 ppd 2011  . Alcohol Use: No     Colonoscopy: 2009  PAP: s/p hysterectomy  Bone density: 2010. "good"  Mammography: 2012 NOV--unremarkable  No Known Allergies  Current Outpatient Prescriptions  Medication Sig Dispense Refill  . acetaminophen (TYLENOL) 325 MG tablet Take 650 mg by  mouth every 6 (six) hours as needed. For pain      . Aclidinium Bromide (TUDORZA PRESSAIR) 400 MCG/ACT AEPB Inhale 1 Act into the lungs 2 (two) times daily.  1 each  11  . albuterol-ipratropium (COMBIVENT) 18-103 MCG/ACT inhaler Inhale 2 puffs into the lungs every 4 (four) hours as needed. For wheezing  14.7 Inhaler  3  . atenolol (TENORMIN) 25 MG tablet Take 1 tablet (25 mg total) by mouth daily. 1/2 tab bid  30 tablet  11  . b complex vitamins tablet Take 1 tablet by mouth daily.        Marland Kitchen buPROPion (WELLBUTRIN SR) 150 MG 12 hr tablet Take 150 mg by mouth 2 (two) times daily.       . busPIRone (BUSPAR) 15 MG tablet Take 15 mg by mouth 2 (two) times daily.      . cholecalciferol (VITAMIN D) 1000 UNITS tablet Take 1,000 Units by mouth daily.        . clopidogrel (PLAVIX)  75 MG tablet Take 1 tablet (75 mg total) by mouth daily with breakfast.  60 tablet  3  . dexamethasone (DECADRON) 4 MG tablet Take 8 mg by mouth 2 (two) times daily with a meal. Take 2 tablets by mouth two times a day starting the day after chemotherapy for 3 days.      . diazepam (VALIUM) 5 MG tablet 1-2 po at hs prn insomnia  60 tablet  5  . diazepam (VALIUM) 5 MG tablet Take 5 mg by mouth every 6 (six) hours as needed.      Tery Sanfilippo Sodium (COLACE PO) Take 1 each by mouth daily as needed. For constipation  After treatments only      . ferrous sulfate 325 (65 FE) MG tablet Take 325 mg by mouth daily with breakfast.      . fish oil-omega-3 fatty acids 1000 MG capsule Take 1 g by mouth daily.      Marland Kitchen ipratropium-albuterol (DUONEB) 0.5-2.5 (3) MG/3ML SOLN Inhale 3 mLs into the lungs 3 (three) times daily.       Marland Kitchen ipratropium-albuterol (DUONEB) 0.5-2.5 (3) MG/3ML SOLN INHALE 1 VIAL VIA HAND HELD NEBULIZER 4 TIMES A DAY DX: 496 COPD  3 mL  3  . ipratropium-albuterol (DUONEB) 0.5-2.5 (3) MG/3ML SOLN INHALE 1 VIAL VIA HAND HELD NEBULIZER 4 TIMES A DAY  360 mL  3  . lidocaine-prilocaine (EMLA) cream Apply 1 application topically as  needed. For treatments      . Linaclotide (LINZESS) 290 MCG CAPS Take 1 each by mouth daily as needed.  30 capsule  11  . loratadine (CLARITIN) 10 MG tablet Take 10 mg by mouth daily.      Marland Kitchen lovastatin (MEVACOR) 40 MG tablet Take 40 mg by mouth daily.      . metFORMIN (GLUCOPHAGE) 1000 MG tablet Take 1 tablet (1,000 mg total) by mouth 2 (two) times daily with a meal.  180 tablet  3  . Mometasone Furo-Formoterol Fum (DULERA) 200-5 MCG/ACT AERO Inhale 2 puffs into the lungs 2 (two) times daily.  1 Inhaler  11  . omeprazole (PRILOSEC) 40 MG capsule Take 1 capsule (40 mg total) by mouth daily.  90 capsule  3  . ondansetron (ZOFRAN) 8 MG tablet Take 8 mg by mouth every 12 (twelve) hours as needed. For nausea      . oxymetazoline (AFRIN) 0.05 % nasal spray Place 2 sprays into the nose 2 (two) times daily as needed. Allergies       . prochlorperazine (COMPAZINE) 10 MG tablet Take 10 mg by mouth every 6 (six) hours as needed. For nausea/vomiting      . sertraline (ZOLOFT) 100 MG tablet Take 100 mg by mouth daily before breakfast.      . SYMBICORT 160-4.5 MCG/ACT inhaler       . DISCONTD: diazepam (VALIUM) 5 MG tablet Take 1 tablet (5 mg total) by mouth every 6 (six) hours as needed for anxiety (pt may take up to 2 tabs if needed).  30 tablet  1  . DISCONTD: busPIRone (BUSPAR) 15 MG tablet Take 1 tablet (15 mg total) by mouth 2 (two) times daily.  180 tablet  1  . DISCONTD: roflumilast (DALIRESP) 500 MCG TABS tablet Take 500 mcg by mouth daily.          OBJECTIVE:   Filed Vitals:   05/05/12 1018  BP: 110/69  Pulse: 96  Temp: 98.8 F (37.1 C)  Resp: 20  Body mass index is 21.78 kg/(m^2).    ECOG FS: 1  Filed Weights   05/05/12 1018  Weight: 128 lb 14.4 oz (58.469 kg)   Physical Exam: middle-aged white woman in no acute distress HEENT:  Sclerae anicteric  Oropharynx clear.  Nodes:  No cervical, supraclavicular, or axillary lymphadenopathy palpated.  Breast Exam:  Deferred  Lungs:  Clear  to auscultation bilaterally  Heart:  Regular rate and rhythm.   Abdomen:  Soft, nontender, nondistended.   Positive bowel sounds.  Musculoskeletal:  No focal spinal tenderness to palpation.  Extremities:  No peripheral edema.   Neuro:  Nonfocal. Patient is alert and oriented x3. Speech was clear, not slurred. No facial droop noted.   LAB RESULTS: Lab Results  Component Value Date   WBC 7.9 05/05/2012   NEUTROABS 4.8 05/05/2012   HGB 12.1 05/05/2012   HCT 35.6 05/05/2012   MCV 95.2 05/05/2012   PLT 261 05/05/2012      Chemistry      Component Value Date/Time   NA 138 04/14/2012 1031   NA 136 03/25/2012 1011   K 4.0 04/14/2012 1031   K 4.0 03/25/2012 1011   CL 104 04/14/2012 1031   CL 100 03/25/2012 1011   CO2 24 04/14/2012 1031   CO2 24 03/25/2012 1011   BUN 15.0 04/14/2012 1031   BUN 13 03/25/2012 1011   CREATININE 0.8 04/14/2012 1031   CREATININE 0.64 03/25/2012 1011      Component Value Date/Time   CALCIUM 9.6 04/14/2012 1031   CALCIUM 9.7 03/25/2012 1011   ALKPHOS 122 04/14/2012 1031   ALKPHOS 114 03/25/2012 1011   AST 15 04/14/2012 1031   AST 15 03/25/2012 1011   ALT 18 04/14/2012 1031   ALT 16 03/25/2012 1011   BILITOT 0.40 04/14/2012 1031   BILITOT 0.2* 03/25/2012 1011     Results for SEDALIA, GREESON (MRN [id number]) as of 03/25/2012 12:33  Ref. Range 11/15/2011 10:40 12/02/2011 15:15 01/14/2012 10:55 02/25/2012 11:10 03/11/2012 13:18  CA 125 Latest Range: 0.0-30.2 U/mL 838.5 (H) 496.1 (H) 118.9 (H) 46.9 (H) 45.1 (H)   STUDIES:  Nm Pet Image Restag (ps) Skull Base To Thigh  04/02/2012  *RADIOLOGY REPORT*  Clinical Data: Subsequent treatment strategy for ovarian cancer. Restaging scan  NUCLEAR MEDICINE PET SKULL BASE TO THIGH  Fasting Blood Glucose:  120  Technique:  14.1 mCi F-18 FDG was injected intravenously. CT data was obtained and used for attenuation correction and anatomic localization only.  (This was not acquired as a diagnostic CT examination.) Additional exam technical data  entered on technologist worksheet.  Comparison:  CT of the abdomen and pelvis 10/15/2011.  Findings:  Neck: No hypermetabolic lymph nodes in the neck.  Chest:  No hypermetabolic mediastinal or hilar nodes.  In the anterior aspect of the left upper lobe there is a 6 mm subpleural nodule, without associated hypermetabolic activity (this is likely well below the resolution of PET imaging, however). No other larger more suspicious appearing pulmonary nodules or masses are otherwise identified. There is atherosclerosis of the thoracic aorta, the great vessels of the mediastinum and the coronary arteries, including calcified atherosclerotic plaque in the left anterior descending and right coronary arteries. Extensive calcifications of the mitral annulus.  Right internal jugular single lumen Port-A- Cath with tip terminating in the distal superior vena cava.Status post left-sided modified radical mastectomy with axillary nodal dissection.  No definite focal soft tissue mass is identified along the left chest wall to suggest local  recurrence of disease.  Abdomen/Pelvis:  No abnormal hypermetabolic activity within the liver, pancreas, adrenal glands, or spleen.  No hypermetabolic lymph nodes in the abdomen or pelvis. No definite focal peritoneal soft tissue masses are identified.  No significant volume of ascites.  Postoperative changes of omentectomy are noted.  The patient is also status post total abdominal hysterectomy and bilateral salpingo-oophorectomy.  Extensive atherosclerosis of the abdominal and pelvic vasculature, without definite aneurysm.  [place]:  On the CT portion of the examination and there are no definite aggressive appearing lytic or blastic lesions in the visualized portions of the skeleton.  However, on the PET portion of the examination there is widespread hypermetabolic activity throughout all aspects of the axial and appendicular skeleton.  The overall appearance is most compatible with a marrow  stimulation (per review of records, the patient has been on Neulasta).  IMPRESSION: 1.  Diffuse low level hypermetabolic activity throughout the visualized axial and appendicular skeleton, without corresponding aggressive appearing bony lesions on the CT portion of the examination.  This appearance is most characteristic for marrow stimulation, which is consistent with the patient's history of Neulasta treatment. 2.  No definite signs of residual or metastatic disease in the neck, chest, abdomen or pelvis on today's examination. 3.  There is, however, a nonspecific 6 mm subpleural nodule in the anterior aspect of the left upper lobe (image 60 to of series 2). This warrants attention on follow-up studies, as a solitary metastasis or primary lung lesion is not excluded, although small lesions in subpleural locations like this are commonly benign subpleural lymph nodes. 4.  Status post left modified radical mastectomy and axillary nodal dissection, without evidence of recurrent disease. 5. Atherosclerosis, including left anterior descending and right coronary artery disease. Assessment for potential risk factor modification, dietary therapy or pharmacologic therapy may be warranted, if clinically indicated.   Original Report Authenticated By: Florencia Reasons, M.D.      ASSESSMENT: 71 y.o.  [place] woman  (1) status post left modified radical mastectomy October 1993 for a T2 N0 invasive ductal breast, estrogen and progesterone receptor positive cancer treated adjuvantly with MFL (methotrexate/fluorouracil/leucovorin) chemotherapy x6, completed April 1994, followed by 5 years of tamoxifen completed April 1999, with no evidence of recurrence  (2) now status post TAH BSO, omentectomy, and suboptimal debulking 10/29/2011 for a high-grade serous adenocarcinoma of the ovary, pT3c NX (Stage IIIC) receiving chemotherapy, 8 cycle of every three-week carboplatin/paclitaxel, with Neulasta on day 2 for  granulocyte support.  The goal is to proceed to at least 2 cycles beyond normalization of CA 125.  (3) COPD with ongoing tobacco abuse  (4) diabetes mellitus  (5)  BRCA 1 and BRCA 2 negative with a "genetic variant of uncertain significance"; additional genetic testing for Lynch syndrome pending.  (6)  status post hospitalization in July 2013 for stroke/small acute infarcts in the left sub insular white matter and coronal radiata.  PLAN:  [name] [name] will proceed to treatment today as scheduled for her eighth and final dose of carboplatin/paclitaxel. She will receive Neulasta tomorrow, and I will see her in 2 weeks, October 14, for assessment of chemotoxicity. She will return to see Dr. Darnelle Catalan to discuss long-term followup in late November. In Also, per Dr. Forrestine Him suggestion, we will repeat a chest CT in late November to reassess the small subpleural nodule noted on the recent PET scan. This is thought to be benign.  We will discuss scheduling this when I see her in a couple of  weeks.  [name] [name] voices understanding and agreement with our plan thus far, and will call with any changes or problems.   [name] [name]    05/05/2012

## 2012-05-05 NOTE — Progress Notes (Signed)
1110: Negative 1120: Negative, slightly red, no irritation 1135:

## 2012-05-05 NOTE — Telephone Encounter (Signed)
Gave patient appointment for labs on 05-18-2012

## 2012-05-06 ENCOUNTER — Ambulatory Visit: Payer: Medicare Other | Admitting: Physician Assistant

## 2012-05-06 ENCOUNTER — Ambulatory Visit (HOSPITAL_BASED_OUTPATIENT_CLINIC_OR_DEPARTMENT_OTHER): Payer: Medicare Other

## 2012-05-06 ENCOUNTER — Ambulatory Visit: Payer: Medicare Other

## 2012-05-06 ENCOUNTER — Other Ambulatory Visit: Payer: Medicare Other | Admitting: Lab

## 2012-05-06 VITALS — BP 111/62 | HR 98 | Temp 97.8°F

## 2012-05-06 DIAGNOSIS — C569 Malignant neoplasm of unspecified ovary: Secondary | ICD-10-CM

## 2012-05-06 DIAGNOSIS — K5900 Constipation, unspecified: Secondary | ICD-10-CM

## 2012-05-06 LAB — CA 125: CA 125: 38.8 U/mL — ABNORMAL HIGH (ref 0.0–30.2)

## 2012-05-06 MED ORDER — PEGFILGRASTIM INJECTION 6 MG/0.6ML
6.0000 mg | Freq: Once | SUBCUTANEOUS | Status: AC
  Administered 2012-05-06: 6 mg via SUBCUTANEOUS
  Filled 2012-05-06: qty 0.6

## 2012-05-07 ENCOUNTER — Ambulatory Visit: Payer: Medicare Other

## 2012-05-13 ENCOUNTER — Ambulatory Visit: Payer: Medicare Other | Admitting: Oncology

## 2012-05-13 ENCOUNTER — Other Ambulatory Visit: Payer: Medicare Other | Admitting: Lab

## 2012-05-18 ENCOUNTER — Ambulatory Visit (HOSPITAL_BASED_OUTPATIENT_CLINIC_OR_DEPARTMENT_OTHER): Payer: Medicare Other | Admitting: Physician Assistant

## 2012-05-18 ENCOUNTER — Encounter: Payer: Self-pay | Admitting: Physician Assistant

## 2012-05-18 ENCOUNTER — Telehealth: Payer: Self-pay | Admitting: Oncology

## 2012-05-18 ENCOUNTER — Other Ambulatory Visit (HOSPITAL_BASED_OUTPATIENT_CLINIC_OR_DEPARTMENT_OTHER): Payer: Medicare Other | Admitting: Lab

## 2012-05-18 VITALS — BP 121/73 | HR 97 | Temp 98.3°F | Resp 20 | Ht 64.5 in | Wt 133.0 lb

## 2012-05-18 DIAGNOSIS — C569 Malignant neoplasm of unspecified ovary: Secondary | ICD-10-CM

## 2012-05-18 DIAGNOSIS — Z23 Encounter for immunization: Secondary | ICD-10-CM

## 2012-05-18 DIAGNOSIS — Z853 Personal history of malignant neoplasm of breast: Secondary | ICD-10-CM

## 2012-05-18 DIAGNOSIS — R599 Enlarged lymph nodes, unspecified: Secondary | ICD-10-CM

## 2012-05-18 LAB — CBC WITH DIFFERENTIAL/PLATELET
BASO%: 0.1 % (ref 0.0–2.0)
Eosinophils Absolute: 0.1 10*3/uL (ref 0.0–0.5)
LYMPH%: 12.3 % — ABNORMAL LOW (ref 14.0–49.7)
MCHC: 33.8 g/dL (ref 31.5–36.0)
MCV: 99 fL (ref 79.5–101.0)
MONO%: 4 % (ref 0.0–14.0)
NEUT#: 12.7 10*3/uL — ABNORMAL HIGH (ref 1.5–6.5)
Platelets: 241 10*3/uL (ref 145–400)
RBC: 3.33 10*6/uL — ABNORMAL LOW (ref 3.70–5.45)
RDW: 16.2 % — ABNORMAL HIGH (ref 11.2–14.5)
WBC: 15.3 10*3/uL — ABNORMAL HIGH (ref 3.9–10.3)

## 2012-05-18 LAB — COMPREHENSIVE METABOLIC PANEL
ALT: 14 U/L (ref 0–35)
Albumin: 3.6 g/dL (ref 3.5–5.2)
Alkaline Phosphatase: 162 U/L — ABNORMAL HIGH (ref 39–117)
CO2: 26 mEq/L (ref 19–32)
Potassium: 3.9 mEq/L (ref 3.5–5.3)
Sodium: 136 mEq/L (ref 135–145)
Total Bilirubin: 0.2 mg/dL — ABNORMAL LOW (ref 0.3–1.2)
Total Protein: 7.2 g/dL (ref 6.0–8.3)

## 2012-05-18 MED ORDER — INFLUENZA VIRUS VACC SPLIT PF IM SUSP
0.5000 mL | Freq: Once | INTRAMUSCULAR | Status: AC
  Administered 2012-05-18: 0.5 mL via INTRAMUSCULAR
  Filled 2012-05-18: qty 0.5

## 2012-05-18 NOTE — Telephone Encounter (Signed)
gve the pt her nov,dec 2013 appt calendar °

## 2012-05-18 NOTE — Progress Notes (Signed)
ID: [name] [name]   DOB: 03-22-1941  MR#: [id number]  CSN#:[id number]  HISTORY OF PRESENT ILLNESS: [name] [name] is a 71 year-old [place] woman I followed remotely for a history of breast cancer. More recently she noted that her belly was getting "bigger". Gradually she has developed increasing low back pain. After a period of several months, as her symptoms increased, she brought this problem to her primary physician's attention and a KUB was obtained on 10/07/2011, which was unremarkable. This was followed by a CT scan 10/15/2011, which showed ascites and omental caking. Paracentesis 10/18/2011 showed ([id number]) malignant cells consistent with metastatic adenocarcinoma, with equivocal staining for estrogen receptor. The pattern of additional stains was most suggestive of a primary gynecologic tumor. CA 125 at this time was 2051.  The patient was referred to gynecologic oncology and on 10/29/2011 underwent exploratory laparotomy under Drs. Cleda Mccreedy and Antionette Char. This consisted of a total abdominal hysterectomy with bilateral salpingo-oophorectomy, omentectomy, and radical tumor debulking. Unfortunately there was significant tumor attached to the diaphragm so the patient was suboptimally debulked. Accordingly a peritoneal catheter was not placed. GYN-ONC recommended 6 cycles of carboplatin/paclitaxel given every 3 weeks, with Neulasta on day 2 for granulocyte support. [name] [name]'s subsequent history is as detailed below.  INTERVAL HISTORY:  [name] [name] returns today accompanied by her husband for followup of her ovarian carcinoma. Today is day 14 cycle 8 of 8 planned cycles of q. three-week carboplatin/paclitaxel    Interval history is generally unremarkable. [name] [name] is beginning to feel more like herself. She has had a little more energy. She's been doing a lot of reading. She does feel like she has some "chemotherapy brain" and has difficulty multitasking.  REVIEW OF SYSTEMS:  [name] [name] denies  any fevers or chills. She's had no rashes or skin changes. She denies abnormal bleeding or bruising. No mouth ulcers or oral sensitivity. No nausea or emesis, and no change in bowel or bladder habits. She continues to have shortness of breath which is stable. She is still smoking, a little over half a pack per day.  She has an occasional cough but no phlegm production. No chest pain or palpitations. No abnormal headaches, dizziness,  visual changes, or seizure activity. She occasionally feels a little "unsteady" on her feet. No slurred speech. There is no focal weakness. She denies any abdominal pain, pelvic pain, or unusual myalgias or arthralgias. No peripheral swelling. No signs of peripheral neuropathy in either the upper or lower extremities.  A detailed review of systems is otherwise noncontributory.   PAST MEDICAL HISTORY: Past Medical History  Diagnosis Date  . History of breast cancer   . COPD (chronic obstructive pulmonary disease)   . GERD (gastroesophageal reflux disease)   . Diabetes mellitus     type II  . Hx of colonic polyps   . Osteoarthritis   . MVP (mitral valve prolapse)   . Dysrhythmia     pt states runs a little fast   . Shortness of breath     with exertion   . Depression   . Anxiety   . Heart murmur   . Cancer     left breast  . Ovarian cancer     active chemotherapy    PAST SURGICAL HISTORY: Past Surgical History  Procedure Date  . Cesarean section 1980, 1982    X 2   . Laparoscopy     work up for infertility  . Tympanoplasty     left X 2  . Left breast  fibroadenoma removal 1960's  . Breast cyst aspirations   . Left modified radial mastectomy   . Laparotomy 10/29/2011    Procedure: EXPLORATORY LAPAROTOMY;  Surgeon: Rejeana Brock A. Duard Brady, MD;  Location: [facility];  Service: Gynecology;  Laterality: N/A;  . Abdominal hysterectomy 10/29/2011    Procedure: HYSTERECTOMY ABDOMINAL;  Surgeon: Rejeana Brock A. Duard Brady, MD;  Location: [facility];  Service: Gynecology;  Laterality:  N/A;  Total abdominal hysterectomy  . Salpingoophorectomy 10/29/2011    Procedure: SALPINGO OOPHERECTOMY;  Surgeon: Rejeana Brock A. Duard Brady, MD;  Location: [facility];  Service: Gynecology;  Laterality: Bilateral;    FAMILY HISTORY Family History  Problem Relation Age of Onset  . Cancer Mother     endometrial/ colon  . Cancer Brother     colon  . COPD Other   . Hypertension Other   . Cancer Maternal Uncle     prostate/bladder/lung  . Cancer Paternal Aunt     unknown abdominal cancer  . Cancer Maternal Grandmother     Salivary gland cancer  . Cancer Paternal Aunt     leukemia   the patient's father died at the age of 32. The patient's mother died at the age of 17 she had a history of colon cancer and endometrial cancer. The patient has no sisters. She has one brother, with a history of colon cancer. There is no history of breast cancer in the family. The patient is scheduled for genetic testing later this month.  GYNECOLOGIC HISTORY: She had menarche age 89. First pregnancy to term was at age 77. She is GX T2. She became menopausal at the time of her chemotherapy for breast cancer. This was age 45. 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 Her husband [name] [name] runs a Psychologist, sport and exercise out of their own home. Daughter [name] [name], 44,  currently lives at home with the patient. There are some issues relating to this daughter that the patient discussed briefly. [place]. [name] [name], 30, is an ED nurse at [employer]. The patient has no grandchildren. She is not a Advice worker.   ADVANCED DIRECTIVES: not in place  HEALTH MAINTENANCE: History  Substance Use Topics  . Smoking status: Current Every Day Smoker -- 0.5 packs/day for 30 years    Types: Cigarettes  . Smokeless tobacco: Never Used   Comment: quit again 2009; restarted 1 ppd 2011  . Alcohol Use: No     Colonoscopy: 2009  PAP: s/p hysterectomy  Bone density: 2010.  "good"  Mammography: 2012 NOV--unremarkable  No Known Allergies  Current Outpatient Prescriptions  Medication Sig Dispense Refill  . acetaminophen (TYLENOL) 325 MG tablet Take 650 mg by mouth every 6 (six) hours as needed. For pain      . Aclidinium Bromide (TUDORZA PRESSAIR) 400 MCG/ACT AEPB Inhale 1 Act into the lungs 2 (two) times daily.  1 each  11  . albuterol-ipratropium (COMBIVENT) 18-103 MCG/ACT inhaler Inhale 2 puffs into the lungs every 4 (four) hours as needed. For wheezing  14.7 Inhaler  3  . atenolol (TENORMIN) 25 MG tablet Take 1 tablet (25 mg total) by mouth daily. 1/2 tab bid  30 tablet  11  . b complex vitamins tablet Take 1 tablet by mouth daily.        [name] [name] Kitchen buPROPion (WELLBUTRIN SR) 150 MG 12 hr tablet Take 150 mg by mouth 2 (two) times daily.       . busPIRone (BUSPAR) 15 MG tablet Take 15 mg by  mouth 2 (two) times daily.      . cholecalciferol (VITAMIN D) 1000 UNITS tablet Take 1,000 Units by mouth daily.        . clopidogrel (PLAVIX) 75 MG tablet Take 1 tablet (75 mg total) by mouth daily with breakfast.  60 tablet  3  . dexamethasone (DECADRON) 4 MG tablet Take 8 mg by mouth 2 (two) times daily with a meal. Take 2 tablets by mouth two times a day starting the day after chemotherapy for 3 days.      . diazepam (VALIUM) 5 MG tablet 1-2 po at hs prn insomnia  60 tablet  5  . diazepam (VALIUM) 5 MG tablet Take 5 mg by mouth every 6 (six) hours as needed.      [name] [name] Sodium (COLACE PO) Take 1 each by mouth daily as needed. For constipation  After treatments only      . ferrous sulfate 325 (65 FE) MG tablet Take 325 mg by mouth daily with breakfast.      . fish oil-omega-3 fatty acids 1000 MG capsule Take 1 g by mouth daily.      [name] [name] Kitchen ipratropium-albuterol (DUONEB) 0.5-2.5 (3) MG/3ML SOLN Inhale 3 mLs into the lungs 3 (three) times daily.       [name] [name] Kitchen ipratropium-albuterol (DUONEB) 0.5-2.5 (3) MG/3ML SOLN INHALE 1 VIAL VIA HAND HELD NEBULIZER 4 TIMES A DAY DX: 496 COPD  3 mL  3  .  ipratropium-albuterol (DUONEB) 0.5-2.5 (3) MG/3ML SOLN INHALE 1 VIAL VIA HAND HELD NEBULIZER 4 TIMES A DAY  360 mL  3  . lidocaine-prilocaine (EMLA) cream Apply 1 application topically as needed. For treatments      . Linaclotide (LINZESS) 290 MCG CAPS Take 1 each by mouth daily as needed.  30 capsule  11  . loratadine (CLARITIN) 10 MG tablet Take 10 mg by mouth daily.      [name] [name] Kitchen lovastatin (MEVACOR) 40 MG tablet Take 40 mg by mouth daily.      . metFORMIN (GLUCOPHAGE) 1000 MG tablet Take 1 tablet (1,000 mg total) by mouth 2 (two) times daily with a meal.  180 tablet  3  . Mometasone Furo-Formoterol Fum (DULERA) 200-5 MCG/ACT AERO Inhale 2 puffs into the lungs 2 (two) times daily.  1 Inhaler  11  . omeprazole (PRILOSEC) 40 MG capsule Take 1 capsule (40 mg total) by mouth daily.  90 capsule  3  . ondansetron (ZOFRAN) 8 MG tablet Take 8 mg by mouth every 12 (twelve) hours as needed. For nausea      . oxymetazoline (AFRIN) 0.05 % nasal spray Place 2 sprays into the nose 2 (two) times daily as needed. Allergies       . prochlorperazine (COMPAZINE) 10 MG tablet Take 10 mg by mouth every 6 (six) hours as needed. For nausea/vomiting      . sertraline (ZOLOFT) 100 MG tablet Take 100 mg by mouth daily before breakfast.      . SYMBICORT 160-4.5 MCG/ACT inhaler       . DISCONTD: busPIRone (BUSPAR) 15 MG tablet Take 1 tablet (15 mg total) by mouth 2 (two) times daily.  180 tablet  1  . DISCONTD: roflumilast (DALIRESP) 500 MCG TABS tablet Take 500 mcg by mouth daily.         Current Facility-Administered Medications  Medication Dose Route Frequency Provider Last Rate Last Dose  . influenza  inactive virus vaccine (FLUZONE/FLUARIX) injection 0.5 mL  0.5 mL Intramuscular Once Catalina Gravel, PA  0.5 mL at 05/18/12 1142    OBJECTIVE:   Filed Vitals:   05/18/12 1055  BP: 121/73  Pulse: 97  Temp: 98.3 F (36.8 C)  Resp: 20     Body mass index is 22.48 kg/(m^2).    ECOG FS: 1  Filed Weights   05/18/12 1055    Weight: 133 lb (60.328 kg)   Physical Exam: middle-aged white woman who appears tired but is in no acute distress HEENT:  Sclerae anicteric  Oropharynx clear.  Nodes:  No cervical, supraclavicular, or axillary lymphadenopathy palpated.  Breast Exam:  Deferred  Lungs:  Clear to auscultation bilaterally, with the exception of scattered expiratory wheezes and Heart:  Regular rate and rhythm.   Abdomen:  Soft, nontender, nondistended.   Positive bowel sounds.  Musculoskeletal:  No focal spinal tenderness to palpation.  Extremities:  No peripheral edema.   Neuro:  Nonfocal. Patient is alert and oriented x3. Speech was clear, not slurred. No facial droop noted.   LAB RESULTS: Lab Results  Component Value Date   WBC 15.3* 05/18/2012   NEUTROABS 12.7* 05/18/2012   HGB 11.1* 05/18/2012   HCT 33.0* 05/18/2012   MCV 99.0 05/18/2012   PLT 241 05/18/2012      Chemistry      Component Value Date/Time   NA 136 05/18/2012 1029   NA 140 05/05/2012 1000   K 3.9 05/18/2012 1029   K 4.0 05/05/2012 1000   CL 100 05/18/2012 1029   CL 105 05/05/2012 1000   CO2 26 05/18/2012 1029   CO2 23 05/05/2012 1000   BUN 10 05/18/2012 1029   BUN 15.0 05/05/2012 1000   CREATININE 0.66 05/18/2012 1029   CREATININE 0.8 05/05/2012 1000      Component Value Date/Time   CALCIUM 9.3 05/18/2012 1029   CALCIUM 9.9 05/05/2012 1000   ALKPHOS 162* 05/18/2012 1029   ALKPHOS 125 05/05/2012 1000   AST 14 05/18/2012 1029   AST 16 05/05/2012 1000   ALT 14 05/18/2012 1029   ALT 18 05/05/2012 1000   BILITOT 0.2* 05/18/2012 1029   BILITOT 0.40 05/05/2012 1000     CA125: 03/25/2012 39.0   04/14/2012 39.7    05/05/2012 38.8   05/18/2012 pending   STUDIES:  Nm Pet Image Restag (ps) Skull Base To Thigh  04/02/2012  *RADIOLOGY REPORT*  Clinical Data: Subsequent treatment strategy for ovarian cancer. Restaging scan  NUCLEAR MEDICINE PET SKULL BASE TO THIGH  Fasting Blood Glucose:  120  Technique:  14.1 mCi F-18 FDG was  injected intravenously. CT data was obtained and used for attenuation correction and anatomic localization only.  (This was not acquired as a diagnostic CT examination.) Additional exam technical data entered on technologist worksheet.  Comparison:  CT of the abdomen and pelvis 10/15/2011.  Findings:  Neck: No hypermetabolic lymph nodes in the neck.  Chest:  No hypermetabolic mediastinal or hilar nodes.  In the anterior aspect of the left upper lobe there is a 6 mm subpleural nodule, without associated hypermetabolic activity (this is likely well below the resolution of PET imaging, however). No other larger more suspicious appearing pulmonary nodules or masses are otherwise identified. There is atherosclerosis of the thoracic aorta, the great vessels of the mediastinum and the coronary arteries, including calcified atherosclerotic plaque in the left anterior descending and right coronary arteries. Extensive calcifications of the mitral annulus.  Right internal jugular single lumen Port-A- Cath with tip terminating in the distal superior vena cava.Status post left-sided modified radical  mastectomy with axillary nodal dissection.  No definite focal soft tissue mass is identified along the left chest wall to suggest local recurrence of disease.  Abdomen/Pelvis:  No abnormal hypermetabolic activity within the liver, pancreas, adrenal glands, or spleen.  No hypermetabolic lymph nodes in the abdomen or pelvis. No definite focal peritoneal soft tissue masses are identified.  No significant volume of ascites.  Postoperative changes of omentectomy are noted.  The patient is also status post total abdominal hysterectomy and bilateral salpingo-oophorectomy.  Extensive atherosclerosis of the abdominal and pelvic vasculature, without definite aneurysm.  [place]:  On the CT portion of the examination and there are no definite aggressive appearing lytic or blastic lesions in the visualized portions of the skeleton.  However, on  the PET portion of the examination there is widespread hypermetabolic activity throughout all aspects of the axial and appendicular skeleton.  The overall appearance is most compatible with a marrow stimulation (per review of records, the patient has been on Neulasta).  IMPRESSION: 1.  Diffuse low level hypermetabolic activity throughout the visualized axial and appendicular skeleton, without corresponding aggressive appearing bony lesions on the CT portion of the examination.  This appearance is most characteristic for marrow stimulation, which is consistent with the patient's history of Neulasta treatment. 2.  No definite signs of residual or metastatic disease in the neck, chest, abdomen or pelvis on today's examination. 3.  There is, however, a nonspecific 6 mm subpleural nodule in the anterior aspect of the left upper lobe (image 60 to of series 2). This warrants attention on follow-up studies, as a solitary metastasis or primary lung lesion is not excluded, although small lesions in subpleural locations like this are commonly benign subpleural lymph nodes. 4.  Status post left modified radical mastectomy and axillary nodal dissection, without evidence of recurrent disease. 5. Atherosclerosis, including left anterior descending and right coronary artery disease. Assessment for potential risk factor modification, dietary therapy or pharmacologic therapy may be warranted, if clinically indicated.   Original Report Authenticated By: Florencia Reasons, M.D.      ASSESSMENT: 71 y.o.  [place] woman  (1) status post left modified radical mastectomy October 1993 for a T2 N0 invasive ductal breast, estrogen and progesterone receptor positive cancer treated adjuvantly with MFL (methotrexate/fluorouracil/leucovorin) chemotherapy x6, completed April 1994, followed by 5 years of tamoxifen completed April 1999, with no evidence of recurrence  (2) now status post TAH BSO, omentectomy, and suboptimal debulking  10/29/2011 for a high-grade serous adenocarcinoma of the ovary, pT3c NX (Stage IIIC) receiving chemotherapy, 8 cycle of every three-week carboplatin/paclitaxel, with Neulasta on day 2 for granulocyte support.  The goal is to proceed to at least 2 cycles beyond normalization of CA 125.  (3) COPD with ongoing tobacco abuse  (4) diabetes mellitus  (5)  BRCA 1 and BRCA 2 negative with a "genetic variant of uncertain significance"; additional genetic testing for Lynch syndrome pending.  (6)  status post hospitalization in July 2013 for stroke/small acute infarcts in the left sub insular white matter and coronal radiata.  PLAN:  [place] has completed the planned 8 cycles of carboplatin/paclitaxel. She scheduled to return to see Dr. Darnelle Catalan in approximately 5-6 weeks to discuss her followup plan. We are checking her tumor marker today, and we'll check it again prior to her next visit here. Analeise's understanding is that Dr. Nelly Rout would like to see her CA 125 be below 35, and we are awaiting today's results.  Also, per Dr. Forrestine Him suggestion, we will  repeat a chest CT in late November/early December to reassess the small subpleural nodule noted on the recent PET scan. This is thought to be benign.    Aava voices understanding and agreement with our plan thus far, and will call with any changes or problems.   Jasminne Mealy    05/18/2012

## 2012-05-19 LAB — CA 125: CA 125: 37.5 U/mL — ABNORMAL HIGH (ref 0.0–30.2)

## 2012-06-15 ENCOUNTER — Other Ambulatory Visit: Payer: Self-pay | Admitting: *Deleted

## 2012-06-15 MED ORDER — LOVASTATIN 40 MG PO TABS: 40.0000 mg | ORAL_TABLET | Freq: Every day | ORAL | Status: DC

## 2012-06-17 ENCOUNTER — Other Ambulatory Visit (HOSPITAL_BASED_OUTPATIENT_CLINIC_OR_DEPARTMENT_OTHER): Payer: Medicare Other | Admitting: Lab

## 2012-06-17 DIAGNOSIS — C569 Malignant neoplasm of unspecified ovary: Secondary | ICD-10-CM

## 2012-06-17 LAB — CBC WITH DIFFERENTIAL/PLATELET
BASO%: 0.6 % (ref 0.0–2.0)
EOS%: 2.4 % (ref 0.0–7.0)
HCT: 34.6 % — ABNORMAL LOW (ref 34.8–46.6)
LYMPH%: 24.2 % (ref 14.0–49.7)
MCH: 34.9 pg — ABNORMAL HIGH (ref 25.1–34.0)
MCHC: 34.5 g/dL (ref 31.5–36.0)
MONO%: 6.6 % (ref 0.0–14.0)
NEUT%: 66.2 % (ref 38.4–76.8)
Platelets: 300 10*3/uL (ref 145–400)
RBC: 3.41 10*6/uL — ABNORMAL LOW (ref 3.70–5.45)

## 2012-06-17 LAB — COMPREHENSIVE METABOLIC PANEL (CC13)
ALT: 18 U/L (ref 0–55)
AST: 12 U/L (ref 5–34)
Alkaline Phosphatase: 106 U/L (ref 40–150)
Creatinine: 0.8 mg/dL (ref 0.6–1.1)
Total Bilirubin: 0.25 mg/dL (ref 0.20–1.20)

## 2012-06-24 ENCOUNTER — Ambulatory Visit (HOSPITAL_BASED_OUTPATIENT_CLINIC_OR_DEPARTMENT_OTHER): Payer: Medicare Other | Admitting: Oncology

## 2012-06-24 ENCOUNTER — Other Ambulatory Visit: Payer: Self-pay | Admitting: *Deleted

## 2012-06-24 ENCOUNTER — Telehealth: Payer: Self-pay | Admitting: *Deleted

## 2012-06-24 VITALS — BP 116/71 | HR 90 | Temp 97.7°F | Resp 18 | Ht 64.5 in | Wt 131.2 lb

## 2012-06-24 DIAGNOSIS — E119 Type 2 diabetes mellitus without complications: Secondary | ICD-10-CM

## 2012-06-24 DIAGNOSIS — R911 Solitary pulmonary nodule: Secondary | ICD-10-CM

## 2012-06-24 DIAGNOSIS — Z853 Personal history of malignant neoplasm of breast: Secondary | ICD-10-CM

## 2012-06-24 DIAGNOSIS — C569 Malignant neoplasm of unspecified ovary: Secondary | ICD-10-CM

## 2012-06-24 MED ORDER — OMEPRAZOLE 40 MG PO CPDR: 40.0000 mg | DELAYED_RELEASE_CAPSULE | Freq: Every day | ORAL | Status: DC

## 2012-06-24 NOTE — Progress Notes (Signed)
ID: NIKEA SETTLE   DOB: [birth year]  MR#: [id number]  CSN#:[id number]  HISTORY OF PRESENT ILLNESS: [name] [name] is a 71 year-old [place] woman I followed remotely for a history of breast cancer. More recently she noted that her belly was getting "bigger". Gradually she has developed increasing low back pain. After a period of several months, as her symptoms increased, she brought this problem to her primary physician's attention and a KUB was obtained on 10/07/2011, which was unremarkable. This was followed by a CT scan 10/15/2011, which showed ascites and omental caking. Paracentesis 10/18/2011 showed ([id number]) malignant cells consistent with metastatic adenocarcinoma, with equivocal staining for estrogen receptor. The pattern of additional stains was most suggestive of a primary gynecologic tumor. CA 125 at this time was 2051.  The patient was referred to gynecologic oncology and on 10/29/2011 underwent exploratory laparotomy under Drs. Cleda Mccreedy and Antionette Char. This consisted of a total abdominal hysterectomy with bilateral salpingo-oophorectomy, omentectomy, and radical tumor debulking. Unfortunately there was significant tumor attached to the diaphragm so the patient was suboptimally debulked. Accordingly a peritoneal catheter was not placed. GYN-ONC recommended 6 cycles of carboplatin/paclitaxel given every 3 weeks, with Neulasta on day 2 for granulocyte support. [name] [name]'s subsequent history is as detailed below.  INTERVAL HISTORY:  [name] [name] returns today accompanied by her daughter, who unfortunately had a miscarriage recently. [name] [name] has been very busy visiting her in the hospital.  REVIEW OF SYSTEMS:  The main problem the patient is having is hip discomfort. This is not present at rest, but after she walks a while she develops pain in both hips and then has to pretty much the down for a while. She also feels a little unstable when walking on uneven pavement. There is no dizziness.  There is also no numbness in her feet or hands. She has not had any fever bleeding or rash, or any change in bowel or bladder habits. Her appetite is excellent and she's gained 10 pounds. She has a dry cough him a rarely productive, and does feel short of breath particularly when walking up stairs, but can walk up a flight of steps without stopping. She bruises easily (on Plavix). She has not been checking her blood sugars recently. Otherwise a detailed review of systems today was noncontributory.  PAST MEDICAL HISTORY: Past Medical History  Diagnosis Date  . History of breast cancer   . COPD (chronic obstructive pulmonary disease)   . GERD (gastroesophageal reflux disease)   . Diabetes mellitus     type II  . Hx of colonic polyps   . Osteoarthritis   . MVP (mitral valve prolapse)   . Dysrhythmia     pt states runs a little fast   . Shortness of breath     with exertion   . Depression   . Anxiety   . Heart murmur   . Cancer     left breast  . Ovarian cancer     active chemotherapy    PAST SURGICAL HISTORY: Past Surgical History  Procedure Date  . Cesarean section 1980, 1982    X 2   . Laparoscopy     work up for infertility  . Tympanoplasty     left X 2  . Left breast fibroadenoma removal 1960's  . Breast cyst aspirations   . Left modified radial mastectomy   . Laparotomy 10/29/2011    Procedure: EXPLORATORY LAPAROTOMY;  Surgeon: Rejeana Brock A. Duard Brady, MD;  Location: [facility];  Service: Gynecology;  Laterality: N/A;  .  Abdominal hysterectomy 10/29/2011    Procedure: HYSTERECTOMY ABDOMINAL;  Surgeon: Rejeana Brock A. Duard Brady, MD;  Location: [facility];  Service: Gynecology;  Laterality: N/A;  Total abdominal hysterectomy  . Salpingoophorectomy 10/29/2011    Procedure: SALPINGO OOPHERECTOMY;  Surgeon: Rejeana Brock A. Duard Brady, MD;  Location: [facility];  Service: Gynecology;  Laterality: Bilateral;    FAMILY HISTORY Family History  Problem Relation Age of Onset  . Cancer Mother     endometrial/ colon  .  Cancer Brother     colon  . COPD Other   . Hypertension Other   . Cancer Maternal Uncle     prostate/bladder/lung  . Cancer Paternal Aunt     unknown abdominal cancer  . Cancer Maternal Grandmother     Salivary gland cancer  . Cancer Paternal Aunt     leukemia   the patient's father died at the age of 60. The patient's mother died at the age of 44; she had a history of colon cancer and endometrial cancer. The patient has no sisters. She has one brother, with a history of colon cancer. There is no history of breast cancer in the family. The patient is scheduled for genetic testing later this month.  GYNECOLOGIC HISTORY: She had menarche age 55. First pregnancy to term was at age 52. She is GX P2. She became menopausal at the time of her chemotherapy for breast cancer. This was age 8. 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 Her husband [name] [name] runs a Psychologist, sport and exercise out of their own home. Daughter [name] [name], 22,  currently lives at home with the patient. There are some issues relating to this daughter that the patient discussed briefly. [place]. [name] [name], 30, is an ED nurse at [employer]. The patient has no grandchildren. She is not a Advice worker.   ADVANCED DIRECTIVES: in place  HEALTH MAINTENANCE: History  Substance Use Topics  . Smoking status: Current Every Day Smoker -- 0.5 packs/day for 30 years    Types: Cigarettes  . Smokeless tobacco: Never Used     Comment: quit again 2009; restarted 1 ppd 2011  . Alcohol Use: No     Colonoscopy: 2009  PAP: s/p hysterectomy  Bone density: 2010. "good"  Mammography: 2012 NOV--unremarkable  No Known Allergies  Current Outpatient Prescriptions  Medication Sig Dispense Refill  . acetaminophen (TYLENOL) 325 MG tablet Take 650 mg by mouth every 6 (six) hours as needed. For pain      . Aclidinium Bromide (TUDORZA PRESSAIR) 400 MCG/ACT AEPB Inhale 1 Act into the lungs 2  (two) times daily.  1 each  11  . albuterol-ipratropium (COMBIVENT) 18-103 MCG/ACT inhaler Inhale 2 puffs into the lungs every 4 (four) hours as needed. For wheezing  14.7 Inhaler  3  . atenolol (TENORMIN) 25 MG tablet Take 1 tablet (25 mg total) by mouth daily. 1/2 tab bid  30 tablet  11  . b complex vitamins tablet Take 1 tablet by mouth daily.        Marland Kitchen buPROPion (WELLBUTRIN SR) 150 MG 12 hr tablet Take 150 mg by mouth 2 (two) times daily.       . busPIRone (BUSPAR) 15 MG tablet Take 15 mg by mouth 2 (two) times daily.      . cholecalciferol (VITAMIN D) 1000 UNITS tablet Take 1,000 Units by mouth daily.        . clopidogrel (PLAVIX) 75 MG tablet Take 1 tablet (75 mg total) by  mouth daily with breakfast.  60 tablet  3  . dexamethasone (DECADRON) 4 MG tablet Take 8 mg by mouth 2 (two) times daily with a meal. Take 2 tablets by mouth two times a day starting the day after chemotherapy for 3 days.      . diazepam (VALIUM) 5 MG tablet 1-2 po at hs prn insomnia  60 tablet  5  . diazepam (VALIUM) 5 MG tablet Take 5 mg by mouth every 6 (six) hours as needed.      Tery Sanfilippo Sodium (COLACE PO) Take 1 each by mouth daily as needed. For constipation  After treatments only      . ferrous sulfate 325 (65 FE) MG tablet Take 325 mg by mouth daily with breakfast.      . fish oil-omega-3 fatty acids 1000 MG capsule Take 1 g by mouth daily.      Marland Kitchen ipratropium-albuterol (DUONEB) 0.5-2.5 (3) MG/3ML SOLN Inhale 3 mLs into the lungs 3 (three) times daily.       Marland Kitchen ipratropium-albuterol (DUONEB) 0.5-2.5 (3) MG/3ML SOLN INHALE 1 VIAL VIA HAND HELD NEBULIZER 4 TIMES A DAY DX: 496 COPD  3 mL  3  . ipratropium-albuterol (DUONEB) 0.5-2.5 (3) MG/3ML SOLN INHALE 1 VIAL VIA HAND HELD NEBULIZER 4 TIMES A DAY  360 mL  3  . lidocaine-prilocaine (EMLA) cream Apply 1 application topically as needed. For treatments      . Linaclotide (LINZESS) 290 MCG CAPS Take 1 each by mouth daily as needed.  30 capsule  11  . loratadine  (CLARITIN) 10 MG tablet Take 10 mg by mouth daily.      Marland Kitchen lovastatin (MEVACOR) 40 MG tablet Take 1 tablet (40 mg total) by mouth daily.  90 tablet  2  . metFORMIN (GLUCOPHAGE) 1000 MG tablet Take 1 tablet (1,000 mg total) by mouth 2 (two) times daily with a meal.  180 tablet  3  . Mometasone Furo-Formoterol Fum (DULERA) 200-5 MCG/ACT AERO Inhale 2 puffs into the lungs 2 (two) times daily.  1 Inhaler  11  . omeprazole (PRILOSEC) 40 MG capsule Take 1 capsule (40 mg total) by mouth daily.  90 capsule  3  . ondansetron (ZOFRAN) 8 MG tablet Take 8 mg by mouth every 12 (twelve) hours as needed. For nausea      . oxymetazoline (AFRIN) 0.05 % nasal spray Place 2 sprays into the nose 2 (two) times daily as needed. Allergies       . prochlorperazine (COMPAZINE) 10 MG tablet Take 10 mg by mouth every 6 (six) hours as needed. For nausea/vomiting      . sertraline (ZOLOFT) 100 MG tablet Take 100 mg by mouth daily before breakfast.      . SYMBICORT 160-4.5 MCG/ACT inhaler       . [DISCONTINUED] busPIRone (BUSPAR) 15 MG tablet Take 1 tablet (15 mg total) by mouth 2 (two) times daily.  180 tablet  1  . [DISCONTINUED] roflumilast (DALIRESP) 500 MCG TABS tablet Take 500 mcg by mouth daily.         OBJECTIVE: Older white woman in no acute distress   Filed Vitals:   06/24/12 1209  BP: 116/71  Pulse: 90  Temp: 97.7 F (36.5 C)  Resp: 18     Body mass index is 22.17 kg/(m^2).    ECOG FS: 1  Filed Weights   06/24/12 1209  Weight: 131 lb 3.2 oz (59.512 kg)   Sclerae unicteric Oropharynx clear No cervical or supraclavicular adenopathy  Lungs no rales or rhonchi Heart regular rate and rhythm Abd soft, nontender, positive bowel sounds, no masses palpated, no evidence of ascites MSK no focal spinal tenderness, no peripheral edema Neuro: nonfocal Breasts: Status post bilateral mastectomies.  LAB RESULTS: Lab Results  Component Value Date   WBC 7.7 06/17/2012   NEUTROABS 5.1 06/17/2012   HGB 11.9  06/17/2012   HCT 34.6* 06/17/2012   MCV 101.4* 06/17/2012   PLT 300 06/17/2012      Chemistry      Component Value Date/Time   NA 140 06/17/2012 1012   NA 136 05/18/2012 1029   K 4.4 06/17/2012 1012   K 3.9 05/18/2012 1029   CL 108* 06/17/2012 1012   CL 100 05/18/2012 1029   CO2 23 06/17/2012 1012   CO2 26 05/18/2012 1029   BUN 16.0 06/17/2012 1012   BUN 10 05/18/2012 1029   CREATININE 0.8 06/17/2012 1012   CREATININE 0.66 05/18/2012 1029      Component Value Date/Time   CALCIUM 9.9 06/17/2012 1012   CALCIUM 9.3 05/18/2012 1029   ALKPHOS 106 06/17/2012 1012   ALKPHOS 162* 05/18/2012 1029   AST 12 06/17/2012 1012   AST 14 05/18/2012 1029   ALT 18 06/17/2012 1012   ALT 14 05/18/2012 1029   BILITOT 0.25 06/17/2012 1012   BILITOT 0.2* 05/18/2012 1029     Results for HAWRAA, STAMBAUGH (MRN [id number]) as of 06/24/2012 12:15  Ref. Range 10/16/2011 16:39 11/15/2011 10:40 12/02/2011 15:15 01/14/2012 10:55 02/25/2012 11:10 03/11/2012 13:18 03/25/2012 10:11 04/14/2012 10:31 05/05/2012 10:00 05/18/2012 10:29 06/17/2012 10:12  CA 125 Latest Range: 0.0-30.2 U/mL 2050.7 (H) 838.5 (H) 496.1 (H) 118.9 (H) 46.9 (H) 45.1 (H) 39.0 (H) 39.7 (H) 38.8 (H) 37.5 (H) 33.3 (H)   STUDIES:  Nm Pet Image Restag (ps) Skull Base To Thigh  04/02/2012  *RADIOLOGY REPORT*  Clinical Data: Subsequent treatment strategy for ovarian cancer. Restaging scan  NUCLEAR MEDICINE PET SKULL BASE TO THIGH  Fasting Blood Glucose:  120  Technique:  14.1 mCi F-18 FDG was injected intravenously. CT data was obtained and used for attenuation correction and anatomic localization only.  (This was not acquired as a diagnostic CT examination.) Additional exam technical data entered on technologist worksheet.  Comparison:  CT of the abdomen and pelvis 10/15/2011.  Findings:  Neck: No hypermetabolic lymph nodes in the neck.  Chest:  No hypermetabolic mediastinal or hilar nodes.  In the anterior aspect of the left upper lobe there is a 6 mm  subpleural nodule, without associated hypermetabolic activity (this is likely well below the resolution of PET imaging, however). No other larger more suspicious appearing pulmonary nodules or masses are otherwise identified. There is atherosclerosis of the thoracic aorta, the great vessels of the mediastinum and the coronary arteries, including calcified atherosclerotic plaque in the left anterior descending and right coronary arteries. Extensive calcifications of the mitral annulus.  Right internal jugular single lumen Port-A- Cath with tip terminating in the distal superior vena cava.Status post left-sided modified radical mastectomy with axillary nodal dissection.  No definite focal soft tissue mass is identified along the left chest wall to suggest local recurrence of disease.  Abdomen/Pelvis:  No abnormal hypermetabolic activity within the liver, pancreas, adrenal glands, or spleen.  No hypermetabolic lymph nodes in the abdomen or pelvis. No definite focal peritoneal soft tissue masses are identified.  No significant volume of ascites.  Postoperative changes of omentectomy are noted.  The patient is also status post total abdominal hysterectomy  and bilateral salpingo-oophorectomy.  Extensive atherosclerosis of the abdominal and pelvic vasculature, without definite aneurysm.  [place]:  On the CT portion of the examination and there are no definite aggressive appearing lytic or blastic lesions in the visualized portions of the skeleton.  However, on the PET portion of the examination there is widespread hypermetabolic activity throughout all aspects of the axial and appendicular skeleton.  The overall appearance is most compatible with a marrow stimulation (per review of records, the patient has been on Neulasta).  IMPRESSION: 1.  Diffuse low level hypermetabolic activity throughout the visualized axial and appendicular skeleton, without corresponding aggressive appearing bony lesions on the CT portion of the  examination.  This appearance is most characteristic for marrow stimulation, which is consistent with the patient's history of Neulasta treatment. 2.  No definite signs of residual or metastatic disease in the neck, chest, abdomen or pelvis on today's examination. 3.  There is, however, a nonspecific 6 mm subpleural nodule in the anterior aspect of the left upper lobe (image 60 to of series 2). This warrants attention on follow-up studies, as a solitary metastasis or primary lung lesion is not excluded, although small lesions in subpleural locations like this are commonly benign subpleural lymph nodes. 4.  Status post left modified radical mastectomy and axillary nodal dissection, without evidence of recurrent disease. 5. Atherosclerosis, including left anterior descending and right coronary artery disease. Assessment for potential risk factor modification, dietary therapy or pharmacologic therapy may be warranted, if clinically indicated.   Original Report Authenticated By: Florencia Reasons, M.D.      ASSESSMENT: 71 y.o.  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 [V323G (1196T>G)]    (1) status post left modified radical mastectomy October 1993 for a T2 N0 invasive ductal breast, estrogen and progesterone receptor positive cancer treated adjuvantly with MFL (methotrexate/fluorouracil/leucovorin) chemotherapy x6, completed April 1994, followed by 5 years of tamoxifen completed April 1999, with no evidence of recurrence  (2) now status post TAH BSO, omentectomy, and suboptimal debulking 10/29/2011 for a high-grade serous adenocarcinoma of the ovary, pT3c NX (Stage IIIC) , s/p eight cycles of carboplatin/paclitaxel completed 05/05/2012 (initial CA125 of 2050.7 normalized after cycle 5)  (3) COPD with ongoing tobacco abuse  (4) diabetes mellitus  (5) genetic testing for Lynch syndrome pending.  (6)  status post hospitalization in July 2013 for stroke/small  acute infarcts in the left sub insular white matter and coronal radiata.  (7) port in place  (8) left upper lobe 6 mm subpleural nodule, without associated hypermetabolic activity (likely well below the resolution of PET imaging) noted August 2013   PLAN:  Cesiah is recovering well from her chemotherapy. We flushed her port today and will continue to flush it every 6 weeks. We will check lab work each time. She will be seeing Dr. Nelly Rout sometime in the next several weeks, after a CT of the chest which will serve as a new baseline regarding the small pulmonary nodule noted above in this patient at high risk for lung cancer. She will see me again in about 1 month. She has an appointment with Dr. Jennette Banker often about 2 weeks, and I have urged her to bring him a list of her blood sugars, which she is currently not checking. She is planning a trip to [place] January 1 and likely will remain there one month. She knows to call for any problems that may develop before her next visit here.  MAGRINAT,GUSTAV  C    06/24/2012

## 2012-06-24 NOTE — Telephone Encounter (Signed)
port flush today (done) and every 6 weeks x 6; see Dr Laurette Schimke in 6 weeks; see me in about 4 months; labs each port flush  Patient aware of all appointments

## 2012-06-24 NOTE — Addendum Note (Signed)
Addended by: Lorenza Evangelist A on: 06/24/2012 02:22 PM   Modules accepted: Orders

## 2012-07-01 ENCOUNTER — Other Ambulatory Visit: Payer: Self-pay | Admitting: *Deleted

## 2012-07-01 ENCOUNTER — Other Ambulatory Visit: Payer: Self-pay | Admitting: Obstetrics & Gynecology

## 2012-07-01 DIAGNOSIS — Z1231 Encounter for screening mammogram for malignant neoplasm of breast: Secondary | ICD-10-CM

## 2012-07-01 DIAGNOSIS — Z9012 Acquired absence of left breast and nipple: Secondary | ICD-10-CM

## 2012-07-01 MED ORDER — BUSPIRONE HCL 15 MG PO TABS: 15.0000 mg | ORAL_TABLET | Freq: Two times a day (BID) | ORAL | Status: DC

## 2012-07-06 ENCOUNTER — Other Ambulatory Visit (HOSPITAL_BASED_OUTPATIENT_CLINIC_OR_DEPARTMENT_OTHER): Payer: Medicare Other | Admitting: Lab

## 2012-07-06 ENCOUNTER — Ambulatory Visit (HOSPITAL_COMMUNITY)
Admission: RE | Admit: 2012-07-06 | Discharge: 2012-07-06 | Disposition: A | Discharge status: Home / Self Care | Payer: Medicare Other | Source: Ambulatory Visit | Attending: Physician Assistant | Admitting: Physician Assistant

## 2012-07-06 DIAGNOSIS — J438 Other emphysema: Secondary | ICD-10-CM | POA: Insufficient documentation

## 2012-07-06 DIAGNOSIS — C569 Malignant neoplasm of unspecified ovary: Secondary | ICD-10-CM | POA: Insufficient documentation

## 2012-07-06 DIAGNOSIS — I7090 Unspecified atherosclerosis: Secondary | ICD-10-CM | POA: Insufficient documentation

## 2012-07-06 DIAGNOSIS — Z853 Personal history of malignant neoplasm of breast: Secondary | ICD-10-CM

## 2012-07-06 DIAGNOSIS — R911 Solitary pulmonary nodule: Secondary | ICD-10-CM | POA: Insufficient documentation

## 2012-07-06 LAB — CBC WITH DIFFERENTIAL/PLATELET
BASO%: 0.3 % (ref 0.0–2.0)
EOS%: 2.4 % (ref 0.0–7.0)
HGB: 12.6 g/dL (ref 11.6–15.9)
MCH: 32.7 pg (ref 25.1–34.0)
MCHC: 33.5 g/dL (ref 31.5–36.0)
MONO#: 0.5 10*3/uL (ref 0.1–0.9)
RDW: 13.3 % (ref 11.2–14.5)
WBC: 8.8 10*3/uL (ref 3.9–10.3)
lymph#: 2.6 10*3/uL (ref 0.9–3.3)

## 2012-07-06 MED ORDER — IOHEXOL 300 MG/ML  SOLN
80.0000 mL | Freq: Once | INTRAMUSCULAR | Status: AC | PRN
  Administered 2012-07-06: 80 mL via INTRAVENOUS

## 2012-07-08 ENCOUNTER — Other Ambulatory Visit: Payer: Self-pay | Admitting: *Deleted

## 2012-07-08 MED ORDER — OMEPRAZOLE 40 MG PO CPDR: 40.0000 mg | DELAYED_RELEASE_CAPSULE | Freq: Every day | ORAL | Status: DC

## 2012-07-09 ENCOUNTER — Telehealth: Payer: Self-pay | Admitting: Internal Medicine

## 2012-07-09 MED ORDER — BUPROPION HCL ER (SR) 150 MG PO TB12: 150.0000 mg | ORAL_TABLET | Freq: Two times a day (BID) | ORAL | Status: DC

## 2012-07-09 NOTE — Telephone Encounter (Signed)
Done

## 2012-07-09 NOTE — Telephone Encounter (Signed)
OK to fill this prescription with additional refills x5 Thank you!  

## 2012-07-09 NOTE — Telephone Encounter (Signed)
Patient needs a refill on Wellbutrin to [facility], pt is currently out of the medication

## 2012-07-17 ENCOUNTER — Ambulatory Visit (INDEPENDENT_AMBULATORY_CARE_PROVIDER_SITE_OTHER): Payer: Medicare Other | Admitting: Internal Medicine

## 2012-07-17 ENCOUNTER — Encounter: Payer: Self-pay | Admitting: Internal Medicine

## 2012-07-17 ENCOUNTER — Other Ambulatory Visit (INDEPENDENT_AMBULATORY_CARE_PROVIDER_SITE_OTHER): Payer: Medicare Other

## 2012-07-17 VITALS — BP 100/50 | HR 76 | Temp 98.0°F | Resp 16 | Wt 131.0 lb

## 2012-07-17 DIAGNOSIS — E119 Type 2 diabetes mellitus without complications: Secondary | ICD-10-CM

## 2012-07-17 DIAGNOSIS — I639 Cerebral infarction, unspecified: Secondary | ICD-10-CM

## 2012-07-17 DIAGNOSIS — R270 Ataxia, unspecified: Secondary | ICD-10-CM

## 2012-07-17 DIAGNOSIS — R279 Unspecified lack of coordination: Secondary | ICD-10-CM

## 2012-07-17 DIAGNOSIS — R739 Hyperglycemia, unspecified: Secondary | ICD-10-CM

## 2012-07-17 DIAGNOSIS — K668 Other specified disorders of peritoneum: Secondary | ICD-10-CM

## 2012-07-17 DIAGNOSIS — Z5111 Encounter for antineoplastic chemotherapy: Secondary | ICD-10-CM

## 2012-07-17 DIAGNOSIS — R7309 Other abnormal glucose: Secondary | ICD-10-CM

## 2012-07-17 DIAGNOSIS — F329 Major depressive disorder, single episode, unspecified: Secondary | ICD-10-CM

## 2012-07-17 DIAGNOSIS — I6350 Cerebral infarction due to unspecified occlusion or stenosis of unspecified cerebral artery: Secondary | ICD-10-CM

## 2012-07-17 DIAGNOSIS — F32A Depression, unspecified: Secondary | ICD-10-CM

## 2012-07-17 DIAGNOSIS — E785 Hyperlipidemia, unspecified: Secondary | ICD-10-CM

## 2012-07-17 LAB — HEMOGLOBIN A1C: Hgb A1c MFr Bld: 7 % — ABNORMAL HIGH (ref 4.6–6.5)

## 2012-07-17 LAB — BASIC METABOLIC PANEL
BUN: 20 mg/dL (ref 6–23)
CO2: 27 mEq/L (ref 19–32)
Calcium: 9.5 mg/dL (ref 8.4–10.5)
Creatinine, Ser: 0.9 mg/dL (ref 0.4–1.2)

## 2012-07-17 LAB — HEPATIC FUNCTION PANEL
ALT: 18 U/L (ref 0–35)
AST: 18 U/L (ref 0–37)
Albumin: 4.3 g/dL (ref 3.5–5.2)
Alkaline Phosphatase: 79 U/L (ref 39–117)
Bilirubin, Direct: 0.1 mg/dL (ref 0.0–0.3)
Total Protein: 7.8 g/dL (ref 6.0–8.3)

## 2012-07-17 LAB — TSH: TSH: 2.86 u[IU]/mL (ref 0.35–5.50)

## 2012-07-17 LAB — VITAMIN B12: Vitamin B-12: 567 pg/mL (ref 211–911)

## 2012-07-17 MED ORDER — BUPROPION HCL ER (SR) 150 MG PO TB12: 150.0000 mg | ORAL_TABLET | Freq: Two times a day (BID) | ORAL | Status: DC

## 2012-07-17 NOTE — Assessment & Plan Note (Signed)
Continue with current prescription therapy as reflected on the Med list.  

## 2012-07-17 NOTE — Progress Notes (Signed)
   Subjective:    Patient ID: [name] [name], female    DOB: 14-Feb-1941, 71 y.o.   MRN: [id number]  HPI  F/u CVA - no relapse, f/u DM, COPD. C/o poor balance - not better, fatigue - worse in pm C/o insomnia F/u abd tumor and ascitis - s/p debulking and chemo -- on Taxol now No new abd pain  Wt Readings from Last 3 Encounters:  07/17/12 131 lb (59.421 kg)  06/24/12 131 lb 3.2 oz (59.512 kg)  05/18/12 133 lb (60.328 kg)   BP Readings from Last 3 Encounters:  07/17/12 100/50  06/24/12 116/71  05/18/12 121/73      Review of Systems  Constitutional: Positive for fatigue and unexpected weight change. Negative for chills, activity change and appetite change.  HENT: Negative for congestion, mouth sores and sinus pressure.   Eyes: Negative for visual disturbance.  Respiratory: Positive for shortness of breath. Negative for cough and chest tightness.   Gastrointestinal: Positive for diarrhea and abdominal distention. Negative for nausea and abdominal pain.  Genitourinary: Negative for frequency, difficulty urinating and vaginal pain.  Musculoskeletal: Negative for back pain and gait problem.  Skin: Negative for pallor and rash.  Neurological: Negative for dizziness, tremors, weakness, numbness and headaches.  Psychiatric/Behavioral: Negative for confusion and sleep disturbance.       Objective:   Physical Exam  Constitutional: She appears well-developed. No distress.  HENT:  Head: Normocephalic.  Right Ear: External ear normal.  Left Ear: External ear normal.  Nose: Nose normal.  Mouth/Throat: Oropharynx is clear and moist.  Eyes: Conjunctivae normal are normal. Pupils are equal, round, and reactive to light. Right eye exhibits no discharge. Left eye exhibits no discharge.  Neck: Normal range of motion. Neck supple. No JVD present. No tracheal deviation present. No thyromegaly present.  Cardiovascular: Normal rate, regular rhythm and normal heart sounds.   Pulmonary/Chest: No  stridor. No respiratory distress. She has no wheezes.  Abdominal: Soft. Bowel sounds are normal. She exhibits distension. She exhibits no mass. There is no tenderness. There is no rebound and no guarding.  Musculoskeletal: She exhibits no edema and no tenderness.  Lymphadenopathy:    She has no cervical adenopathy.  Neurological: She displays normal reflexes. No cranial nerve deficit. She exhibits normal muscle tone. Coordination normal.  Skin: No rash noted. No erythema.  Psychiatric: She has a normal mood and affect. Her behavior is normal. Judgment and thought content normal.   Lab Results  Component Value Date   WBC 8.8 07/06/2012   HGB 12.6 07/06/2012   HCT 37.6 07/06/2012   PLT 301 07/06/2012   GLUCOSE 162* 06/17/2012   CHOL 170 02/22/2012   TRIG 165* 02/22/2012   HDL 50 02/22/2012   LDLDIRECT 108.0 01/16/2010   LDLCALC 87 02/22/2012   ALT 18 06/17/2012   AST 12 06/17/2012   NA 140 06/17/2012   K 4.4 06/17/2012   CL 108* 06/17/2012   CREATININE 0.8 06/17/2012   BUN 16.0 06/17/2012   CO2 23 06/17/2012   TSH 3.30 08/30/2010   INR 0.94 02/21/2012   HGBA1C 7.0* 02/21/2012   MICROALBUR 1.0 08/30/2010          Assessment & Plan:

## 2012-07-17 NOTE — Assessment & Plan Note (Signed)
7/13 post-CVA Start PT - eval and treat

## 2012-07-17 NOTE — Assessment & Plan Note (Signed)
Discussed.

## 2012-07-17 NOTE — Assessment & Plan Note (Addendum)
On Plavix Start PT

## 2012-07-17 NOTE — Assessment & Plan Note (Signed)
Last chemo finished in 11/13

## 2012-07-20 ENCOUNTER — Ambulatory Visit: Payer: Medicare Other | Attending: Internal Medicine | Admitting: Rehabilitative and Restorative Service Providers"

## 2012-07-20 DIAGNOSIS — I69998 Other sequelae following unspecified cerebrovascular disease: Secondary | ICD-10-CM | POA: Insufficient documentation

## 2012-07-20 DIAGNOSIS — IMO0001 Reserved for inherently not codable concepts without codable children: Secondary | ICD-10-CM | POA: Insufficient documentation

## 2012-07-20 DIAGNOSIS — R269 Unspecified abnormalities of gait and mobility: Secondary | ICD-10-CM | POA: Insufficient documentation

## 2012-07-23 ENCOUNTER — Ambulatory Visit: Payer: Medicare Other | Admitting: Rehabilitative and Restorative Service Providers"

## 2012-07-24 ENCOUNTER — Ambulatory Visit (HOSPITAL_BASED_OUTPATIENT_CLINIC_OR_DEPARTMENT_OTHER): Payer: Medicare Other

## 2012-07-24 VITALS — BP 100/57 | HR 95 | Temp 97.5°F

## 2012-07-24 DIAGNOSIS — Z452 Encounter for adjustment and management of vascular access device: Secondary | ICD-10-CM

## 2012-07-24 DIAGNOSIS — C569 Malignant neoplasm of unspecified ovary: Secondary | ICD-10-CM

## 2012-07-24 MED ORDER — HEPARIN SOD (PORK) LOCK FLUSH 100 UNIT/ML IV SOLN
500.0000 [IU] | Freq: Once | INTRAVENOUS | Status: AC
  Administered 2012-07-24: 500 [IU] via INTRAVENOUS
  Filled 2012-07-24: qty 5

## 2012-07-24 MED ORDER — SODIUM CHLORIDE 0.9 % IJ SOLN
10.0000 mL | INTRAMUSCULAR | Status: DC | PRN
  Administered 2012-07-24: 10 mL via INTRAVENOUS
  Filled 2012-07-24: qty 10

## 2012-08-03 ENCOUNTER — Ambulatory Visit: Payer: Medicare Other | Admitting: Internal Medicine

## 2012-09-07 ENCOUNTER — Ambulatory Visit: Payer: Medicare Other

## 2012-09-10 ENCOUNTER — Encounter: Payer: Self-pay | Admitting: Gynecologic Oncology

## 2012-09-10 ENCOUNTER — Ambulatory Visit (HOSPITAL_BASED_OUTPATIENT_CLINIC_OR_DEPARTMENT_OTHER): Payer: Medicare Other

## 2012-09-10 ENCOUNTER — Ambulatory Visit: Payer: Medicare Other | Attending: Gynecologic Oncology | Admitting: Gynecologic Oncology

## 2012-09-10 VITALS — BP 113/74 | HR 87 | Temp 97.5°F

## 2012-09-10 VITALS — BP 102/58 | HR 68 | Temp 97.8°F | Resp 20 | Ht 64.84 in | Wt 140.7 lb

## 2012-09-10 DIAGNOSIS — R142 Eructation: Secondary | ICD-10-CM | POA: Insufficient documentation

## 2012-09-10 DIAGNOSIS — R143 Flatulence: Secondary | ICD-10-CM | POA: Insufficient documentation

## 2012-09-10 DIAGNOSIS — Z452 Encounter for adjustment and management of vascular access device: Secondary | ICD-10-CM

## 2012-09-10 DIAGNOSIS — R141 Gas pain: Secondary | ICD-10-CM | POA: Insufficient documentation

## 2012-09-10 DIAGNOSIS — C569 Malignant neoplasm of unspecified ovary: Secondary | ICD-10-CM | POA: Insufficient documentation

## 2012-09-10 MED ORDER — SODIUM CHLORIDE 0.9 % IJ SOLN
10.0000 mL | INTRAMUSCULAR | Status: DC | PRN
  Administered 2012-09-10: 10 mL via INTRAVENOUS
  Filled 2012-09-10: qty 10

## 2012-09-10 MED ORDER — HEPARIN SOD (PORK) LOCK FLUSH 100 UNIT/ML IV SOLN
500.0000 [IU] | Freq: Once | INTRAVENOUS | Status: AC
  Administered 2012-09-10: 500 [IU] via INTRAVENOUS
  Filled 2012-09-10: qty 5

## 2012-09-10 NOTE — Patient Instructions (Signed)
I have ordered a pelvic ultrasound to assess for ascites.  Followup with Dr.Magrinat as previously scheduled  Followup with GYN oncology in 6 months

## 2012-09-10 NOTE — Progress Notes (Signed)
Office Visit  Note: Gyn-Onc  [name] [name] 72 y.o. female  CC:  Chief Complaint  Patient presents with  . Ovarian Cancer    Follow up    HPI: HISTORY OF PRESENT ILLNESS:  [name] [name] is a 72 y.o.  year-old woman a history of breast cancer. Early 2013  she noted that her belly was getting "bigger". Gradually she has developed increasing low back pain. After a period of several months, as her symptoms increased, she brought this problem to her primary physician's attention and a KUB was obtained on 10/07/2011, which was unremarkable. This was followed by a CT scan 10/15/2011, which showed ascites and omental caking. Paracentesis 10/18/2011 showed  malignant cells consistent with metastatic adenocarcinoma, with equivocal staining for estrogen receptor. The pattern of additional stains was most suggestive of a primary gynecologic tumor. CA 125 at this time was 2051.   On 10/29/2011, she underwent exploratory laparotomy under Drs. Cleda Mccreedy and Antionette Char. This consisted of a total abdominal hysterectomy with bilateral salpingo-oophorectomy, omentectomy, and radical tumor debulking. Unfortunately there was significant tumor attached to the diaphragm so the patient was suboptimally debulked. Accordingly a peritoneal catheter was not placed.   Specific operative findings included extensive carcinomatosis with significant disease involving the diaphragm on the right side. The majority of nodules are less than 1 cm however there were somewhat confluent. The omentum had a 6 cm primary nodule which was completely removed. At the conclusion of the procedure she had a 2 cm nodule involving the cul-de-sac, diffuse small miliary volume disease involving the mesentery of the small and large bowel, and subcentimeter disease involving the right hemidiaphragm however it was fairly confluent. 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 It was high grade. All biopsies omentum were  positive. The serosa of the uterus was positive.    INTERVAL HISTORY: She's completed 6 cycles of Taxol carboplatin therapy. Her CA 125 prior to chemotherapy was 2050.7. The most recent value on 07/06/2012 returned a value of 35.7. The patient reports a sense of abdominal bloating. She also reports generalized weight gain increased appetite being increasing difficulty with recalling names and words and general mental slowness.  Social Hx:  Patient's husband is present. He is concerned about her fatigue which is improving but profound Past Surgical Hx:  Past Surgical History  Procedure Date  . Cesarean section 1980, 1982    X 2   . Laparoscopy     work up for infertility  . Tympanoplasty     left X 2  . Left breast fibroadenoma removal 1960's  . Breast cyst aspirations   . Left modified radial mastectomy   . Laparotomy 10/29/2011    Procedure: EXPLORATORY LAPAROTOMY;  Surgeon: Rejeana Brock A. Duard Brady, MD;  Location: [facility];  Service: Gynecology;  Laterality: N/A;  . Abdominal hysterectomy 10/29/2011    Procedure: HYSTERECTOMY ABDOMINAL;  Surgeon: Rejeana Brock A. Duard Brady, MD;  Location: [facility];  Service: Gynecology;  Laterality: N/A;  Total abdominal hysterectomy  . Salpingoophorectomy 10/29/2011    Procedure: SALPINGO OOPHERECTOMY;  Surgeon: Rejeana Brock A. Duard Brady, MD;  Location: [facility];  Service: Gynecology;  Laterality: Bilateral;    Past Medical Hx:  Past Medical History  Diagnosis Date  . History of breast cancer   . COPD (chronic obstructive pulmonary disease)   . GERD (gastroesophageal reflux disease)   . Diabetes mellitus     type II  . Hx of colonic polyps   . Osteoarthritis   . MVP (mitral valve prolapse)   .  Dysrhythmia     pt states runs a little fast   . Shortness of breath     with exertion   . Depression   . Anxiety   . Heart murmur   . Cancer     left breast  . Ovarian cancer     active chemotherapy    Family Hx:  Family History  Problem Relation Age of Onset  . Cancer Mother      endometrial/ colon  . Cancer Brother     colon  . COPD Other   . Hypertension Other   . Cancer Maternal Uncle     prostate/bladder/lung  . Cancer Paternal Aunt     unknown abdominal cancer  . Cancer Maternal Grandmother     Salivary gland cancer  . Cancer Paternal Aunt     leukemia  REVIEW OF SYSTEMS:   Constitutional: Patient feels well, Cardiovascular: Denies chest pain shortness of breath or lower extremity edema Pulmonary: No shortness of breath hemoptysis or cough GI: No evidence of recurrent ascites no nausea vomiting abdominal pain reports normal bowel movements denies diarrhea or hematochezia, reports abdominal bloating and increasing abdominal girth GYN: No vaginal bleeding or discharge no dysuria or incontinence Musculoskeletal: Reports some myalgia associated with Neulasta denies any neuropathy Skin: Denies any new lesions Otherwise 10 point review of systems  within normal limits   Vitals:  Blood pressure 102/58, pulse 68, temperature 97.8 F (36.6 C), temperature source Oral, resp. rate 20, height 5' 4.84" (1.647 m), weight 140 lb 11.2 oz (63.821 kg). Body mass index is 23.53 kg/(m^2).  Physical Exam: This  well-developed Chest: Clear to auscultation bilaterally Cardiac: Regular rate and rhythm no discernible murmurs Abdomen: Soft,. She has a well-healed vertical midline incision. It is nondistended. There is no fluid wave. Midline vaginal incision is intact without any hernia Pelvic: Normal Bartholin's urethral and Skene's glands  There is no nodularity within the vagina or cul-de-sac is no evidence of vaginal discharge or bleeding Rectal: Good anal sphincter tone external hemorrhoids appreciated no nodularity rectovaginal septum Back: No CVA tenderness Extremities: No edema. LN:  NO cervical supra clavicular or inguinal adenopathy  Assessment/Plan:  This is a 72 y.o. year-old with suboptimally debulked stage IIIc ovarian carcinoma who is doing well status  post surgery. She has completed 8 cycles of chemotherapy with normalization of her CA 125.  Marland Kitchen  Patient with complaints of abdominal bloating and increasing abdominal girth. She states her appetite is good reports generalized weight gain. Physical examination findings are not suggestive of a fluid wave however I have ordered a pelvic ultrasound to assess for ascites.  Followup with Dr.Magrinat as previously scheduled  Followup with GYN oncology in 6 months  Laurette Schimke, MD 09/10/2012, 6:11 PM

## 2012-09-11 ENCOUNTER — Ambulatory Visit (HOSPITAL_COMMUNITY)
Admission: RE | Admit: 2012-09-11 | Discharge: 2012-09-11 | Disposition: A | Discharge status: Home / Self Care | Payer: Medicare Other | Source: Ambulatory Visit | Attending: Gynecologic Oncology | Admitting: Gynecologic Oncology

## 2012-09-11 ENCOUNTER — Other Ambulatory Visit: Payer: Self-pay | Admitting: Gynecologic Oncology

## 2012-09-11 ENCOUNTER — Telehealth: Payer: Self-pay | Admitting: Gynecologic Oncology

## 2012-09-11 DIAGNOSIS — C569 Malignant neoplasm of unspecified ovary: Secondary | ICD-10-CM | POA: Insufficient documentation

## 2012-09-11 DIAGNOSIS — K8020 Calculus of gallbladder without cholecystitis without obstruction: Secondary | ICD-10-CM | POA: Insufficient documentation

## 2012-09-11 DIAGNOSIS — R188 Other ascites: Secondary | ICD-10-CM | POA: Insufficient documentation

## 2012-09-11 NOTE — Telephone Encounter (Signed)
Patient informed of ultrasound results.  Instructed that Dr. Nelly Rout would be notified of the results and that she would be contacted with her recommendations.  No concerns voiced.   Instructed to call the office for any questions or concerns.

## 2012-09-11 NOTE — Telephone Encounter (Signed)
Patient informed about Dr. Nelly Rout recommendations to monitor for signs and symptoms since the amount of fluid is minimal at this time.  Instructed to call for any questions or concerns.

## 2012-09-14 ENCOUNTER — Other Ambulatory Visit: Payer: Self-pay | Admitting: Internal Medicine

## 2012-09-15 ENCOUNTER — Other Ambulatory Visit: Payer: Self-pay | Admitting: Gynecologic Oncology

## 2012-09-15 DIAGNOSIS — C569 Malignant neoplasm of unspecified ovary: Secondary | ICD-10-CM

## 2012-09-16 ENCOUNTER — Telehealth: Payer: Self-pay | Admitting: Gynecologic Oncology

## 2012-09-16 ENCOUNTER — Telehealth: Payer: Self-pay | Admitting: *Deleted

## 2012-09-16 ENCOUNTER — Other Ambulatory Visit: Payer: Self-pay | Admitting: Internal Medicine

## 2012-09-16 NOTE — Telephone Encounter (Signed)
Called [name] [name] to notify her of scheduled appointments. 09/21/2012(Monday) a lab appointment at 11:30. 09/24/12(Thursday) CT and chest xray starting at 12:00pm. [name] [name] was instructed to pick up her contrast for CT scan on 09/21/2012 after her lab appointment.   [name] [name] agreed with times and dates of each appointment.

## 2012-09-16 NOTE — Telephone Encounter (Signed)
Patient called with complaints of continued abdominal heaviness, intermittent constipation, low back pain, and urinary frequency.  "My clothes just don't fit. Do you think that the cancer is back?"  Situation discussed with Dr. Darnelle Catalan, her medical oncologist, and he stated to order a CT of the abd/pelvis, chest xray, and lab work to assess for recurrence.  Pt informed of his recommendations.  Orders placed and pt to be notified of dates for upcoming labs and scans.  Instructed to call for any questions or concerns.

## 2012-09-21 ENCOUNTER — Other Ambulatory Visit (HOSPITAL_BASED_OUTPATIENT_CLINIC_OR_DEPARTMENT_OTHER): Payer: Medicare Other | Admitting: Lab

## 2012-09-21 DIAGNOSIS — Z853 Personal history of malignant neoplasm of breast: Secondary | ICD-10-CM

## 2012-09-21 DIAGNOSIS — C569 Malignant neoplasm of unspecified ovary: Secondary | ICD-10-CM

## 2012-09-21 LAB — CBC WITH DIFFERENTIAL/PLATELET
Basophils Absolute: 0 10*3/uL (ref 0.0–0.1)
EOS%: 1.6 % (ref 0.0–7.0)
Eosinophils Absolute: 0.2 10*3/uL (ref 0.0–0.5)
HGB: 11.4 g/dL — ABNORMAL LOW (ref 11.6–15.9)
LYMPH%: 21.1 % (ref 14.0–49.7)
MCH: 31.5 pg (ref 25.1–34.0)
MCV: 91.9 fL (ref 79.5–101.0)
MONO%: 5.5 % (ref 0.0–14.0)
Platelets: 498 10*3/uL — ABNORMAL HIGH (ref 145–400)
RDW: 13.6 % (ref 11.2–14.5)

## 2012-09-21 LAB — COMPREHENSIVE METABOLIC PANEL (CC13)
Alkaline Phosphatase: 89 U/L (ref 40–150)
BUN: 14.7 mg/dL (ref 7.0–26.0)
CO2: 26 mEq/L (ref 22–29)
Creatinine: 0.8 mg/dL (ref 0.6–1.1)
Glucose: 178 mg/dl — ABNORMAL HIGH (ref 70–99)
Sodium: 139 mEq/L (ref 136–145)
Total Bilirubin: <0.2 mg/dL (ref 0.20–1.20)
Total Protein: 7.5 g/dL (ref 6.4–8.3)

## 2012-09-22 ENCOUNTER — Ambulatory Visit (INDEPENDENT_AMBULATORY_CARE_PROVIDER_SITE_OTHER): Payer: Medicare Other | Admitting: Internal Medicine

## 2012-09-22 ENCOUNTER — Other Ambulatory Visit: Payer: Self-pay | Admitting: Oncology

## 2012-09-22 ENCOUNTER — Encounter: Payer: Self-pay | Admitting: Internal Medicine

## 2012-09-22 VITALS — BP 98/58 | HR 84 | Temp 97.9°F | Resp 16 | Wt 137.0 lb

## 2012-09-22 DIAGNOSIS — R141 Gas pain: Secondary | ICD-10-CM

## 2012-09-22 DIAGNOSIS — K219 Gastro-esophageal reflux disease without esophagitis: Secondary | ICD-10-CM

## 2012-09-22 DIAGNOSIS — F17200 Nicotine dependence, unspecified, uncomplicated: Secondary | ICD-10-CM

## 2012-09-22 DIAGNOSIS — J449 Chronic obstructive pulmonary disease, unspecified: Secondary | ICD-10-CM

## 2012-09-22 DIAGNOSIS — F329 Major depressive disorder, single episode, unspecified: Secondary | ICD-10-CM

## 2012-09-22 DIAGNOSIS — E119 Type 2 diabetes mellitus without complications: Secondary | ICD-10-CM

## 2012-09-22 DIAGNOSIS — C569 Malignant neoplasm of unspecified ovary: Secondary | ICD-10-CM

## 2012-09-22 DIAGNOSIS — R140 Abdominal distension (gaseous): Secondary | ICD-10-CM

## 2012-09-22 MED ORDER — METHYLPREDNISOLONE ACETATE 80 MG/ML IJ SUSP
80.0000 mg | Freq: Once | INTRAMUSCULAR | Status: AC
  Administered 2012-09-22: 80 mg via INTRAMUSCULAR

## 2012-09-22 MED ORDER — SERTRALINE HCL 100 MG PO TABS: 100.0000 mg | ORAL_TABLET | Freq: Every day | ORAL | Status: DC

## 2012-09-22 NOTE — Assessment & Plan Note (Signed)
Continue with current prescription therapy as reflected on the Med list.  

## 2012-09-22 NOTE — Assessment & Plan Note (Signed)
Ca relapsed

## 2012-09-22 NOTE — Assessment & Plan Note (Signed)
Discussed e-cigs in place of regular

## 2012-09-22 NOTE — Assessment & Plan Note (Signed)
F/u abd tumor and ascitis - s/p debulking and chemo -- s/pTaxol  Has a new abd pain and swelling CA125 = 879 CT is pending Dr Darnelle Catalan appt is pending

## 2012-09-22 NOTE — Progress Notes (Signed)
   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 C/o poor balance - not better, fatigue - worse in pm C/o insomnia F/u abd tumor and ascitis - s/p debulking and chemo -- s/pTaxol  Has a new abd pain and swelling CA125 = 879  Wt Readings from Last 3 Encounters:  09/22/12 137 lb (62.143 kg)  09/10/12 140 lb 11.2 oz (63.821 kg)  07/17/12 131 lb (59.421 kg)   BP Readings from Last 3 Encounters:  09/22/12 98/58  09/10/12 113/74  09/10/12 102/58      Review of Systems  Constitutional: Positive for fatigue and unexpected weight change. Negative for chills, activity change and appetite change.  HENT: Negative for congestion, mouth sores and sinus pressure.   Eyes: Negative for visual disturbance.  Respiratory: Positive for shortness of breath. Negative for cough and chest tightness.   Gastrointestinal: Positive for diarrhea and abdominal distention. Negative for nausea and abdominal pain.  Genitourinary: Negative for frequency, difficulty urinating and vaginal pain.  Musculoskeletal: Negative for back pain and gait problem.  Skin: Negative for pallor and rash.  Neurological: Negative for dizziness, tremors, weakness, numbness and headaches.  Psychiatric/Behavioral: Negative for confusion and sleep disturbance.       Objective:   Physical Exam  Constitutional: She appears well-developed. No distress.  HENT:  Head: Normocephalic.  Right Ear: External ear normal.  Left Ear: External ear normal.  Nose: Nose normal.  Mouth/Throat: Oropharynx is clear and moist.  Eyes: Conjunctivae are normal. Pupils are equal, round, and reactive to light. Right eye exhibits no discharge. Left eye exhibits no discharge.  Neck: Normal range of motion. Neck supple. No JVD present. No tracheal deviation present. No thyromegaly present.  Cardiovascular: Normal rate, regular rhythm and normal heart sounds.   Pulmonary/Chest: No stridor. No respiratory distress. She has no wheezes.   Abdominal: Soft. Bowel sounds are normal. She exhibits distension. She exhibits no mass. There is no tenderness. There is no rebound and no guarding.  Musculoskeletal: She exhibits no edema and no tenderness.  Lymphadenopathy:    She has no cervical adenopathy.  Neurological: She displays normal reflexes. No cranial nerve deficit. She exhibits normal muscle tone. Coordination normal.  Skin: No rash noted. No erythema.  Psychiatric: She has a normal mood and affect. Her behavior is normal. Judgment and thought content normal.   Lab Results  Component Value Date   WBC 9.9 09/21/2012   HGB 11.4* 09/21/2012   HCT 33.2* 09/21/2012   PLT 498* 09/21/2012   GLUCOSE 178* 09/21/2012   CHOL 170 02/22/2012   TRIG 165* 02/22/2012   HDL 50 02/22/2012   LDLDIRECT 108.0 01/16/2010   LDLCALC 87 02/22/2012   ALT 15 09/21/2012   AST 15 09/21/2012   NA 139 09/21/2012   K 4.2 09/21/2012   CL 101 09/21/2012   CREATININE 0.8 09/21/2012   BUN 14.7 09/21/2012   CO2 26 09/21/2012   TSH 2.86 07/17/2012   INR 0.94 02/21/2012   HGBA1C 7.0* 07/17/2012   MICROALBUR 1.0 08/30/2010          Assessment & Plan:

## 2012-09-24 ENCOUNTER — Telehealth: Payer: Self-pay | Admitting: Oncology

## 2012-09-24 ENCOUNTER — Encounter (HOSPITAL_COMMUNITY): Payer: Self-pay

## 2012-09-24 ENCOUNTER — Ambulatory Visit: Payer: Medicare Other | Attending: Gynecologic Oncology | Admitting: Gynecologic Oncology

## 2012-09-24 ENCOUNTER — Ambulatory Visit (HOSPITAL_COMMUNITY)
Admission: RE | Admit: 2012-09-24 | Discharge: 2012-09-24 | Disposition: A | Discharge status: Home / Self Care | Payer: Medicare Other | Source: Ambulatory Visit | Attending: Gynecologic Oncology | Admitting: Gynecologic Oncology

## 2012-09-24 ENCOUNTER — Other Ambulatory Visit: Payer: Self-pay | Admitting: Gynecologic Oncology

## 2012-09-24 ENCOUNTER — Encounter: Payer: Self-pay | Admitting: Gynecologic Oncology

## 2012-09-24 VITALS — BP 118/60 | HR 80 | Temp 98.3°F | Resp 18 | Ht 64.84 in | Wt 135.0 lb

## 2012-09-24 DIAGNOSIS — C569 Malignant neoplasm of unspecified ovary: Secondary | ICD-10-CM

## 2012-09-24 DIAGNOSIS — N200 Calculus of kidney: Secondary | ICD-10-CM | POA: Insufficient documentation

## 2012-09-24 DIAGNOSIS — K219 Gastro-esophageal reflux disease without esophagitis: Secondary | ICD-10-CM | POA: Insufficient documentation

## 2012-09-24 DIAGNOSIS — R188 Other ascites: Secondary | ICD-10-CM | POA: Insufficient documentation

## 2012-09-24 DIAGNOSIS — C786 Secondary malignant neoplasm of retroperitoneum and peritoneum: Secondary | ICD-10-CM | POA: Insufficient documentation

## 2012-09-24 DIAGNOSIS — K5900 Constipation, unspecified: Secondary | ICD-10-CM | POA: Insufficient documentation

## 2012-09-24 DIAGNOSIS — E119 Type 2 diabetes mellitus without complications: Secondary | ICD-10-CM | POA: Insufficient documentation

## 2012-09-24 DIAGNOSIS — J4489 Other specified chronic obstructive pulmonary disease: Secondary | ICD-10-CM | POA: Insufficient documentation

## 2012-09-24 DIAGNOSIS — J449 Chronic obstructive pulmonary disease, unspecified: Secondary | ICD-10-CM | POA: Insufficient documentation

## 2012-09-24 DIAGNOSIS — R350 Frequency of micturition: Secondary | ICD-10-CM | POA: Insufficient documentation

## 2012-09-24 MED ORDER — IOHEXOL 300 MG/ML  SOLN
100.0000 mL | Freq: Once | INTRAMUSCULAR | Status: AC | PRN
  Administered 2012-09-24: 100 mL via INTRAVENOUS

## 2012-09-24 NOTE — Patient Instructions (Addendum)
  Followup with GYN oncology in 2 months  Thank you very much [name] for allowing me to provide care for you today.  I appreciate your confidence in choosing our Gynecologic Oncology team.  If you have any questions about your visit today please call our office and we will get back to you as soon as possible.  [name]. Chrishawna Farina MD., PhD Gynecologic Oncology

## 2012-09-24 NOTE — Progress Notes (Signed)
Office Visit  Note: Gyn-Onc  [name] [name] 72 y.o. female  CC:  Chief Complaint  Patient presents with  . Ovarian Cancer    Follow up    HPI: HISTORY OF PRESENT ILLNESS:  [name] [name] is a 72 y.o.  year-old woman a history of breast cancer. Early 2013  she noted that her belly was getting "bigger". Gradually she has developed increasing low back pain. After a period of several months, as her symptoms increased, she brought this problem to her primary physician's attention and a KUB was obtained on 10/07/2011, which was unremarkable. This was followed by a CT scan 10/15/2011, which showed ascites and omental caking. Paracentesis 10/18/2011 showed  malignant cells consistent with metastatic adenocarcinoma, with equivocal staining for estrogen receptor. The pattern of additional stains was most suggestive of a primary gynecologic tumor. CA 125 at this time was 2051.   On 10/29/2011, she underwent exploratory laparotomy under Drs. [name] [name] and [name] [name]. This consisted of a total abdominal hysterectomy with bilateral salpingo-oophorectomy, omentectomy, and radical tumor debulking. Unfortunately there was significant tumor attached to the diaphragm so the patient was suboptimally debulked. Accordingly a peritoneal catheter was not placed.   Specific operative findings included extensive carcinomatosis with significant disease involving the diaphragm on the right side. The majority of nodules are less than 1 cm however there were somewhat confluent. The omentum had a 6 cm primary nodule which was completely removed. At the conclusion of the procedure she had a 2 cm nodule involving the cul-de-sac, diffuse small miliary volume disease involving the mesentery of the small and large bowel, and subcentimeter disease involving the right hemidiaphragm however it was fairly confluent. 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 It was high grade. All biopsies omentum were  positive. The serosa of the uterus was positive.    INTERVAL HISTORY: She's completed 6 cycles of Taxol carboplatin therapy. Her CA 125 prior to chemotherapy was 2050.7. The most recent value on 07/06/2012 returned a value of 35.7. The patient reported a sense of abdominal bloating on 09/10/2012.  A pelvic ultrasound to evaluate for ascites showed only minimal fluid. The patient continued to have symptoms and a CT scan of the abdomen and pelvis was ordered by Dr. Darnelle [name]   CT scan was notable for the presence of significant progression of disease with widespread intraperitoneal metastases and moderate volume of malignant ascites. The gallbladder was severely distended but no definitive gallstones.   Social Hx:  Patient's husband is present.   Past Surgical Hx:  Past Surgical History  Procedure Laterality Date  . Cesarean section  1980, 1982    X 2   . Laparoscopy      work up for infertility  . Tympanoplasty      left X 2  . Left breast fibroadenoma removal  1960's  . Breast cyst aspirations    . Left modified radial mastectomy    . Laparotomy  10/29/2011    Procedure: EXPLORATORY LAPAROTOMY;  Surgeon: [name] Brock A. Duard Brady, MD;  Location: [facility];  Service: Gynecology;  Laterality: N/A;  . Abdominal hysterectomy  10/29/2011    Procedure: HYSTERECTOMY ABDOMINAL;  Surgeon: [name] Brock A. Duard Brady, MD;  Location: [facility];  Service: Gynecology;  Laterality: N/A;  Total abdominal hysterectomy  . Salpingoophorectomy  10/29/2011    Procedure: SALPINGO OOPHERECTOMY;  Surgeon: [name] Brock A. Duard Brady, MD;  Location: [facility];  Service: Gynecology;  Laterality: Bilateral;    Past Medical Hx:  Past Medical History  Diagnosis Date  .  History of breast cancer   . COPD (chronic obstructive pulmonary disease)   . GERD (gastroesophageal reflux disease)   . Diabetes mellitus     type II  . Hx of colonic polyps   . Osteoarthritis   . MVP (mitral valve prolapse)   . Dysrhythmia     pt states runs a little fast   . Shortness  of breath     with exertion   . Depression   . Anxiety   . Heart murmur   . Cancer     left breast  . Ovarian cancer     active chemotherapy    Family Hx:  Family History  Problem Relation Age of Onset  . Cancer Mother     endometrial/ colon  . Cancer Brother     colon  . COPD Other   . Hypertension Other   . Cancer Maternal Uncle     prostate/bladder/lung  . Cancer Paternal Aunt     unknown abdominal cancer  . Cancer Maternal Grandmother     Salivary gland cancer  . Cancer Paternal Aunt     leukemia  REVIEW OF SYSTEMS:   Constitutional: Patient feels sad, Cardiovascular: Denies chest pain shortness of breath or lower extremity edema Pulmonary: No shortness of breath hemoptysis or cough GI: No nausea or vomiting no abdominal pain reports normal bowel movements denies diarrhea or hematochezia, reports abdominal bloating and increasing abdominal girth GYN: No vaginal bleeding or discharge no dysuria or incontinence Musculoskeletal: Reports some myalgia associated with Neulasta denies any neuropathy Skin: Denies any new lesions Otherwise 10 point review of systems  within normal limits   Vitals:  Blood pressure 118/60, pulse 80, temperature 98.3 F (36.8 C), temperature source Oral, resp. rate 18, height 5' 4.84" (1.647 m), weight 135 lb (61.236 kg). Body mass index is 22.57 kg/(m^2).  Physical Exam: This  well-developed Chest: Clear to auscultation bilaterally Cardiac: Regular rate and rhythm no discernible murmurs Abdomen: Soft,. She has a well-healed vertical midline incision. It is  Mildly distended. There is no fluid wave. Intermittent loud bowel sounds are present.  Midline vaginal incision is intact without any hernia.  Mild RUQ tenderness. Back: No CVA tenderness Extremities: No edema. LN:  NO cervical supra clavicular or inguinal adenopathy  Assessment/Plan:  This is a 72 y.o. year-old with suboptimally debulked stage IIIc ovarian carcinoma who as completed  8 cycles of chemotherapy with normalization of her CA 125.  She reports abdominal bloating increasing abdominal girth 2 weeks ago ultrasound to assess for ascites was negative. The patient was persistent in her self advocacy and subsequent CT scan demonstrates recurrent disease.  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 As such agents that would be more appropriate for treatment include Doxil, topotecan, carboplatin/gemcitabine.  They are aware that this disease process is treatable but not curable. Regarding questions associated with her residual length of life they're aware that this is based upon how quickly the disease becomes chemotherapy resistant. There also aware that given the short progression free interval it is possible that she will be in chemotherapy continuously.  Dr. Darnelle [name]  also visited with the patient today. The plan was made to obtain a right upper quadrant ultrasound to better evaluate the gallbladder  Followup with GYN oncology in 2 months   Laurette Schimke, MD 09/24/2012, 6:36 PM

## 2012-09-24 NOTE — Telephone Encounter (Signed)
gv pt appt for 2/27 w/GM and 2/26 for echo. Also confirmed appt for [place] @ [facility] 2/21.

## 2012-09-25 ENCOUNTER — Ambulatory Visit (HOSPITAL_COMMUNITY)
Admission: RE | Admit: 2012-09-25 | Discharge: 2012-09-25 | Disposition: A | Discharge status: Home / Self Care | Payer: Medicare Other | Source: Ambulatory Visit | Attending: Gynecologic Oncology | Admitting: Gynecologic Oncology

## 2012-09-25 DIAGNOSIS — C569 Malignant neoplasm of unspecified ovary: Secondary | ICD-10-CM

## 2012-09-25 DIAGNOSIS — K8020 Calculus of gallbladder without cholecystitis without obstruction: Secondary | ICD-10-CM | POA: Insufficient documentation

## 2012-09-25 DIAGNOSIS — K828 Other specified diseases of gallbladder: Secondary | ICD-10-CM | POA: Insufficient documentation

## 2012-09-28 ENCOUNTER — Telehealth: Payer: Self-pay | Admitting: *Deleted

## 2012-09-28 NOTE — Telephone Encounter (Signed)
Pt informed of GB U/S results results

## 2012-09-30 ENCOUNTER — Ambulatory Visit (HOSPITAL_COMMUNITY)
Admission: RE | Admit: 2012-09-30 | Discharge: 2012-09-30 | Disposition: A | Discharge status: Home / Self Care | Payer: Medicare Other | Source: Ambulatory Visit | Attending: Oncology | Admitting: Oncology

## 2012-09-30 DIAGNOSIS — J4489 Other specified chronic obstructive pulmonary disease: Secondary | ICD-10-CM | POA: Insufficient documentation

## 2012-09-30 DIAGNOSIS — I77810 Thoracic aortic ectasia: Secondary | ICD-10-CM | POA: Insufficient documentation

## 2012-09-30 DIAGNOSIS — C50919 Malignant neoplasm of unspecified site of unspecified female breast: Secondary | ICD-10-CM | POA: Insufficient documentation

## 2012-09-30 DIAGNOSIS — R0989 Other specified symptoms and signs involving the circulatory and respiratory systems: Secondary | ICD-10-CM | POA: Insufficient documentation

## 2012-09-30 DIAGNOSIS — K219 Gastro-esophageal reflux disease without esophagitis: Secondary | ICD-10-CM | POA: Insufficient documentation

## 2012-09-30 DIAGNOSIS — R0609 Other forms of dyspnea: Secondary | ICD-10-CM | POA: Insufficient documentation

## 2012-09-30 DIAGNOSIS — I079 Rheumatic tricuspid valve disease, unspecified: Secondary | ICD-10-CM | POA: Insufficient documentation

## 2012-09-30 DIAGNOSIS — E119 Type 2 diabetes mellitus without complications: Secondary | ICD-10-CM | POA: Insufficient documentation

## 2012-09-30 DIAGNOSIS — F17200 Nicotine dependence, unspecified, uncomplicated: Secondary | ICD-10-CM | POA: Insufficient documentation

## 2012-09-30 DIAGNOSIS — C569 Malignant neoplasm of unspecified ovary: Secondary | ICD-10-CM | POA: Insufficient documentation

## 2012-09-30 DIAGNOSIS — J449 Chronic obstructive pulmonary disease, unspecified: Secondary | ICD-10-CM | POA: Insufficient documentation

## 2012-09-30 DIAGNOSIS — Z01818 Encounter for other preprocedural examination: Secondary | ICD-10-CM | POA: Insufficient documentation

## 2012-09-30 DIAGNOSIS — E785 Hyperlipidemia, unspecified: Secondary | ICD-10-CM | POA: Insufficient documentation

## 2012-09-30 DIAGNOSIS — I369 Nonrheumatic tricuspid valve disorder, unspecified: Secondary | ICD-10-CM

## 2012-09-30 NOTE — Progress Notes (Signed)
  Echocardiogram 2D Echocardiogram has been performed.  Rosaisela Jamroz, [facility] 09/30/2012, 11:24 AM

## 2012-10-01 ENCOUNTER — Telehealth: Payer: Self-pay | Admitting: Oncology

## 2012-10-01 ENCOUNTER — Other Ambulatory Visit: Payer: Self-pay | Admitting: Internal Medicine

## 2012-10-01 ENCOUNTER — Ambulatory Visit: Payer: Medicare Other

## 2012-10-01 ENCOUNTER — Ambulatory Visit (HOSPITAL_BASED_OUTPATIENT_CLINIC_OR_DEPARTMENT_OTHER): Payer: Medicare Other | Admitting: Oncology

## 2012-10-01 ENCOUNTER — Other Ambulatory Visit: Payer: Self-pay

## 2012-10-01 ENCOUNTER — Telehealth (INDEPENDENT_AMBULATORY_CARE_PROVIDER_SITE_OTHER): Payer: Self-pay | Admitting: General Surgery

## 2012-10-01 ENCOUNTER — Other Ambulatory Visit: Payer: Self-pay | Admitting: Oncology

## 2012-10-01 VITALS — BP 109/72 | HR 89 | Temp 97.7°F | Resp 20 | Ht 64.84 in | Wt 135.0 lb

## 2012-10-01 DIAGNOSIS — F329 Major depressive disorder, single episode, unspecified: Secondary | ICD-10-CM

## 2012-10-01 DIAGNOSIS — J449 Chronic obstructive pulmonary disease, unspecified: Secondary | ICD-10-CM

## 2012-10-01 DIAGNOSIS — Z853 Personal history of malignant neoplasm of breast: Secondary | ICD-10-CM

## 2012-10-01 DIAGNOSIS — K5900 Constipation, unspecified: Secondary | ICD-10-CM

## 2012-10-01 DIAGNOSIS — Z1231 Encounter for screening mammogram for malignant neoplasm of breast: Secondary | ICD-10-CM

## 2012-10-01 DIAGNOSIS — C569 Malignant neoplasm of unspecified ovary: Secondary | ICD-10-CM

## 2012-10-01 DIAGNOSIS — Z9012 Acquired absence of left breast and nipple: Secondary | ICD-10-CM

## 2012-10-01 MED ORDER — PROCHLORPERAZINE MALEATE 10 MG PO TABS: 10.0000 mg | ORAL_TABLET | Freq: Four times a day (QID) | ORAL | Status: DC | PRN

## 2012-10-01 MED ORDER — ONDANSETRON HCL 8 MG PO TABS: 8.0000 mg | ORAL_TABLET | Freq: Two times a day (BID) | ORAL | Status: DC

## 2012-10-01 NOTE — Telephone Encounter (Signed)
Appt made. [name] made aware. She will call me if this does not work for the patient.

## 2012-10-01 NOTE — Progress Notes (Signed)
ID: [name] [name]   DOB: August 23, 1940  MR#: [id number]  CSN#:[id number]  HISTORY OF PRESENT ILLNESS: [name] [name] is a 72 year-old [place] woman I followed remotely for a history of breast cancer. More recently she noted that her belly was getting "bigger". Gradually she has developed increasing low back pain. After a period of several months, as her symptoms increased, she brought this problem to her primary physician's attention and a KUB was obtained on 10/07/2011, which was unremarkable. This was followed by a CT scan 10/15/2011, which showed ascites and omental caking. Paracentesis 10/18/2011 showed ([id number]) malignant cells consistent with metastatic adenocarcinoma, with equivocal staining for estrogen receptor. The pattern of additional stains was most suggestive of a primary gynecologic tumor. CA 125 at this time was 2051.  The patient was referred to gynecologic oncology and on 10/29/2011 underwent exploratory laparotomy under Drs. Cleda Mccreedy and Antionette Char. This consisted of a total abdominal hysterectomy with bilateral salpingo-oophorectomy, omentectomy, and radical tumor debulking. Unfortunately there was significant tumor attached to the diaphragm so the patient was suboptimally debulked. Accordingly a peritoneal catheter was not placed. GYN-ONC recommended 6 cycles of carboplatin/paclitaxel given every 3 weeks, with Neulasta on day 2 for granulocyte support. [name] [name]'s subsequent history is as detailed below.  INTERVAL HISTORY:  [name] [name] returns today accompanied by her husband [name] [name]. Since her last visit here, sometime in January, she started noted bloating of her abdomen. We obtained CT scans and a repeat CA 125 and there is obvious disease recurrence. This is well under 6 months from the completion of her treatment.  REVIEW OF SYSTEMS:  She is understandably depressed. She is on 3 antidepressant at present however. In addition to always problems, she has had significant right  upper quadrant pain, and an ultrasound we obtained to evaluate that does show a distended gallbladder and significant number of stones. I think she would benefit from a cholecystectomy, but we also need to get chemotherapy going. She is mildly fatigued, has some back pain in addition to the right upper quadrant pain, which is intermittent, has shortness of breath from her emphysema and occasional productive cough. Unfortunately she continues to smoke. She has loose bowel movements sometimes 3-4 times a day. A detailed review of systems was otherwise noncontributory.  PAST MEDICAL HISTORY: Past Medical History  Diagnosis Date  . History of breast cancer   . COPD (chronic obstructive pulmonary disease)   . GERD (gastroesophageal reflux disease)   . Diabetes mellitus     type II  . Hx of colonic polyps   . Osteoarthritis   . MVP (mitral valve prolapse)   . Dysrhythmia     pt states runs a little fast   . Shortness of breath     with exertion   . Depression   . Anxiety   . Heart murmur   . Cancer     left breast  . Ovarian cancer     active chemotherapy    PAST SURGICAL HISTORY: Past Surgical History  Procedure Laterality Date  . Cesarean section  1980, 1982    X 2   . Laparoscopy      work up for infertility  . Tympanoplasty      left X 2  . Left breast fibroadenoma removal  1960's  . Breast cyst aspirations    . Left modified radial mastectomy    . Laparotomy  10/29/2011    Procedure: EXPLORATORY LAPAROTOMY;  Surgeon: Rejeana Brock A. Duard Brady, MD;  Location: [facility];  Service:  Gynecology;  Laterality: N/A;  . Abdominal hysterectomy  10/29/2011    Procedure: HYSTERECTOMY ABDOMINAL;  Surgeon: Rejeana Brock A. Duard Brady, MD;  Location: [facility];  Service: Gynecology;  Laterality: N/A;  Total abdominal hysterectomy  . Salpingoophorectomy  10/29/2011    Procedure: SALPINGO OOPHERECTOMY;  Surgeon: Rejeana Brock A. Duard Brady, MD;  Location: [facility];  Service: Gynecology;  Laterality: Bilateral;    FAMILY  HISTORY Family History  Problem Relation Age of Onset  . Cancer Mother     endometrial/ colon  . Cancer Brother     colon  . COPD Other   . Hypertension Other   . Cancer Maternal Uncle     prostate/bladder/lung  . Cancer Paternal Aunt     unknown abdominal cancer  . Cancer Maternal Grandmother     Salivary gland cancer  . Cancer Paternal Aunt     leukemia   the patient's father died at the age of 19. The patient's mother died at the age of 16; she had a history of colon cancer and endometrial cancer. The patient has no sisters. She has one brother, with a history of colon cancer. There is no history of breast cancer in the family. The patient is scheduled for genetic testing later this month.  GYNECOLOGIC HISTORY: She had menarche age 65. First pregnancy to term was at age 53. She is GX P2. She became menopausal at the time of her chemotherapy for breast cancer. This was age 34. 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 Her husband [name] [name] runs a Psychologist, sport and exercise out of their own home. Daughter [name] [name], 38,  currently lives at home with the patient. There are some issues relating to this daughter that the patient discussed briefly. [place]. [name] [name], 30, is an ED nurse at [employer]. The patient has no grandchildren. She is not a Advice worker.   ADVANCED DIRECTIVES: in place  HEALTH MAINTENANCE: History  Substance Use Topics  . Smoking status: Current Every Day Smoker -- 0.50 packs/day for 30 years    Types: Cigarettes  . Smokeless tobacco: Never Used     Comment: quit again 2009; restarted 1 ppd 2011  . Alcohol Use: No     Colonoscopy: 2009  PAP: s/p hysterectomy  Bone density: 2010. "good"  Mammography: 2012 NOV--unremarkable  No Known Allergies  Current Outpatient Prescriptions  Medication Sig Dispense Refill  . acetaminophen (TYLENOL) 325 MG tablet Take 650 mg by mouth every 6 (six) hours as needed.  For pain      . Aclidinium Bromide (TUDORZA PRESSAIR) 400 MCG/ACT AEPB Inhale 1 Act into the lungs 2 (two) times daily.  1 each  11  . albuterol-ipratropium (COMBIVENT) 18-103 MCG/ACT inhaler Inhale 2 puffs into the lungs every 4 (four) hours as needed. For wheezing  14.7 Inhaler  3  . atenolol (TENORMIN) 25 MG tablet Take 1 tablet (25 mg total) by mouth daily. 1/2 tab bid  30 tablet  11  . b complex vitamins tablet Take 1 tablet by mouth daily.        Marland Kitchen buPROPion (WELLBUTRIN SR) 150 MG 12 hr tablet Take 1 tablet (150 mg total) by mouth 2 (two) times daily.  180 tablet  3  . busPIRone (BUSPAR) 15 MG tablet Take 1 tablet (15 mg total) by mouth 2 (two) times daily.  180 tablet  2  . cholecalciferol (VITAMIN D) 1000 UNITS tablet Take 1,000 Units by mouth daily.        Marland Kitchen  clopidogrel (PLAVIX) 75 MG tablet Take 1 tablet (75 mg total) by mouth daily with breakfast.  60 tablet  3  . diazepam (VALIUM) 5 MG tablet 1-2 po at hs prn insomnia  60 tablet  5  . ferrous sulfate 325 (65 FE) MG tablet Take 325 mg by mouth daily with breakfast.      . ipratropium-albuterol (DUONEB) 0.5-2.5 (3) MG/3ML SOLN INHALE 1 VIAL VIA HAND HELD NEBULIZER 4 TIMES A DAY  360 mL  3  . lidocaine-prilocaine (EMLA) cream Apply 1 application topically as needed. For treatments      . lovastatin (MEVACOR) 40 MG tablet Take 1 tablet (40 mg total) by mouth daily.  90 tablet  2  . metFORMIN (GLUCOPHAGE) 1000 MG tablet TAKE 1 TABLET BY MOUTH TWICE DAILY WITH A MEAL  180 tablet  3  . Mometasone Furo-Formoterol Fum (DULERA) 200-5 MCG/ACT AERO Inhale 2 puffs into the lungs 2 (two) times daily.  1 Inhaler  11  . omeprazole (PRILOSEC) 40 MG capsule Take 1 capsule (40 mg total) by mouth daily.  90 capsule  3  . oxymetazoline (AFRIN) 0.05 % nasal spray Place 2 sprays into the nose 2 (two) times daily as needed. Allergies       . sertraline (ZOLOFT) 100 MG tablet Take 1 tablet (100 mg total) by mouth daily before breakfast.  90 tablet  3  .  SYMBICORT 160-4.5 MCG/ACT inhaler INHALE 2 PUFFS IN THE LUNGS TWICE DAILY  10.2 g  5  . [DISCONTINUED] busPIRone (BUSPAR) 15 MG tablet Take 1 tablet (15 mg total) by mouth 2 (two) times daily.  180 tablet  1  . [DISCONTINUED] roflumilast (DALIRESP) 500 MCG TABS tablet Take 500 mcg by mouth daily.         No current facility-administered medications for this visit.   OBJECTIVE: Older white woman who appears fatigued   Filed Vitals:   10/01/12 0938  BP: 109/72  Pulse: 89  Temp: 97.7 F (36.5 C)  Resp: 20     Body mass index is 22.57 kg/(m^2).    ECOG FS: 2  Filed Weights   10/01/12 0938  Weight: 135 lb (61.236 kg)   Sclerae unicteric Oropharynx clear No cervical or supraclavicular adenopathy Lungs no rales or rhonchi Heart regular rate and rhythm Abd soft, minimal to no tenderness to deep palpation in the right upper quadrant; the abdomen is distended. Bowel sounds are positive MSK no focal spinal tenderness, no peripheral edema Neuro: nonfocal, well oriented, depressed affect Breasts: Status post bilateral mastectomies.  LAB RESULTS: Lab Results  Component Value Date   WBC 9.9 09/21/2012   NEUTROABS 7.1* 09/21/2012   HGB 11.4* 09/21/2012   HCT 33.2* 09/21/2012   MCV 91.9 09/21/2012   PLT 498* 09/21/2012      Chemistry      Component Value Date/Time   NA 139 09/21/2012 1150   NA 136 07/17/2012 1117   K 4.2 09/21/2012 1150   K 4.2 07/17/2012 1117   CL 101 09/21/2012 1150   CL 100 07/17/2012 1117   CO2 26 09/21/2012 1150   CO2 27 07/17/2012 1117   BUN 14.7 09/21/2012 1150   BUN 20 07/17/2012 1117   CREATININE 0.8 09/21/2012 1150   CREATININE 0.9 07/17/2012 1117      Component Value Date/Time   CALCIUM 10.0 09/21/2012 1150   CALCIUM 9.5 07/17/2012 1117   ALKPHOS 89 09/21/2012 1150   ALKPHOS 79 07/17/2012 1117   AST 15 09/21/2012 1150  AST 18 07/17/2012 1117   ALT 15 09/21/2012 1150   ALT 18 07/17/2012 1117   BILITOT <0.20 Repeated and Verified 09/21/2012 1150   BILITOT  0.5 07/17/2012 1117    Results for JANAIYAH, BLACKARD (MRN [id number]) as of 10/01/2012 10:00  Ref. Range 05/05/2012 10:00 05/18/2012 10:29 06/17/2012 10:12 07/06/2012 12:37 09/21/2012 11:50  CA 125 Latest Range: 0.0-30.2 U/mL 38.8 (H) 37.5 (H) 33.3 (H) 35.7 (H) 879.3 (H)    STUDIES: Dg Chest 2 View  09/24/2012  *RADIOLOGY REPORT*  Clinical Data: History of ovarian and breast cancer.  CHEST - 2 VIEW  Comparison: CT chest 07/06/2012 and plain films of the chest 10/24/2011.  Findings: Right IJ approach Port-A-Cath is in place as on the CT scan.  Surgical clips are seen in the left axilla.  The patient is status post left mastectomy.  The lungs are emphysematous but clear.  Heart size is normal.  No pneumothorax or pleural effusion. No focal bony abnormality.  IMPRESSION: Negative for metastatic or acute disease.  Stable compared to prior exams.   Original Report Authenticated By: Holley Dexter, M.D.    US Abdomen Complete  09/25/2012  *RADIOLOGY REPORT*  Clinical Data:  Gallbladder distention, pain.  Follow-up CT.  COMPLETE ABDOMINAL ULTRASOUND  Comparison:  09/24/2012  Findings:  Gallbladder:  Gallbladder is enlarged/distended.  Layering gallstones present.  No wall thickening.  The patient was tender over the gallbladder during the study.  Common bile duct:   Normal caliber, 4 mm.  Liver:  No focal lesion identified.  Within normal limits in parenchymal echogenicity.  IVC:  Difficult to visualize due to overlying bowel gas.  Pancreas:  No focal abnormality seen.  Spleen:  Within normal limits in size and echotexture.  Right Kidney:   Normal in size and parenchymal echogenicity.  No evidence of mass or hydronephrosis.  Left Kidney:  Normal in size and parenchymal echogenicity.  No evidence of mass or hydronephrosis.  Abdominal aorta:  No aneurysm identified.  Ascites is noted as seen on prior CT.  IMPRESSION: Significantly distended gallbladder with layering gallstones and tenderness over the gallbladder  during the study.  No wall thickening or pericholecystic fluid.  If  there is clinical concern for acute cholecystitis, this could be further evaluated with nuclear medicine hepatobiliary scan.  Ascites.   Original Report Authenticated By: Charlett Nose, M.D.    Ct Abdomen Pelvis W Contrast  09/24/2012  *RADIOLOGY REPORT*  Clinical Data: History of ovarian cancer.  Complaining of increased abdominal fullness, constipation and urinary frequency. Chemotherapy completed in the May 24, 2012.  CT ABDOMEN AND PELVIS WITH CONTRAST  Technique:  Multidetector CT imaging of the abdomen and pelvis was performed following the standard protocol during bolus administration of intravenous contrast.  Contrast: OMNIPAQUE IOHEXOL 300 MG/ML  SOLN  Comparison: PET CT 04/02/2012.  Findings:  Lung Bases: Severe calcifications in the inferior aspect of the mitral annulus.  Abdomen/Pelvis:  Compared to the prior examination there is now a moderate volume of ascites and there is multifocal enhancing nodularity scattered throughout the peritoneal cavity, compatible with widespread peritoneal metastatic disease.  The largest enhancing soft tissue lesions are located anteriorly on image 33 of series 2 measuring 2.4 x 1.1 cm, and in the left upper quadrant immediately anterior and inferior to the spleen measuring 3.0 x 2.5 cm (image 22 of series 2).  The appearance of the liver, pancreas, spleen and bilateral adrenal glands is unremarkable. The gallbladder is severely distended, but no definite  gallstones are identified.  Additionally, gallbladder wall thickness appears normal (2 mm).  5 mm calcification in the lower pole collecting system of the left kidney likely represents a nonobstructive calculus.  A subcentimeter low attenuation lesion in the lower pole of the right kidney is too small to definitively characterize.  Extensive atherosclerosis throughout the abdominal and pelvic vasculature, without definite aneurysm.  No  pneumoperitoneum.  No pathologic distension of small bowel.  Musculoskeletal: There are no aggressive appearing lytic or blastic lesions noted in the visualized portions of the skeleton.  IMPRESSION: 1.  Today's study demonstrates a significant progression of disease with widespread intraperitoneal metastases and moderate volume of presumably malignant ascites. 2.  The gallbladder is severely distended.  No definite gallstones are identified.  Gallbladder does not appear thickened.  Given the presence of ascites, accurate imaging assessment for acute cholecystitis is limited on today's CT examination.  This could be further evaluated with right upper quadrant abdominal ultrasound if there is clinical concern for acute cholecystitis. 3.  Additional incidental findings, as above.  These results were called by telephone on 09/24/2012 at 01:38 p.m. to Dr. Jed Limerick, who verbally acknowledged these results.   Original Report Authenticated By: Trudie Reed, M.D.    US Abdomen Limited  09/11/2012  *RADIOLOGY REPORT*  Clinical Data: 72 year old female with ovarian cancer.  LIMITED ABDOMEN ULTRASOUND FOR ASCITES  Technique:  Limited ultrasound survey for ascites was performed in all four abdominal quadrants.  Comparison:  Chest CT 07/06/2012 and earlier.  Findings: Cholelithiasis.  No pericholecystic fluid.  Negative visible right kidney.  Small volume of bilateral lower quadrant ascites fluid.  Small volume left upper quadrant fluid.  No right upper quadrant fluid identified.  Quantity appears to be insufficient for paracentesis.  IMPRESSION: 1.  Small volume ascites in the left abdomen and right lower quadrant.  Quantity appears to be insufficient for paracentesis. 2.  Cholelithiasis.   Original Report Authenticated By: Erskine Speed, M.D.    ASSESSMENT: 72 y.o.  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 [V323G (1196T>G)]    (1) status post left modified radical mastectomy  October 1993 for a T2 N0 invasive ductal breast, estrogen and progesterone receptor positive cancer treated adjuvantly with MFL (methotrexate/fluorouracil/leucovorin) chemotherapy x6, completed April 1994, followed by 5 years of tamoxifen completed April 1999, with no evidence of recurrence  (2) now status post TAH BSO, omentectomy, and suboptimal debulking 10/29/2011 for a high-grade serous adenocarcinoma of the ovary, pT3c NX (Stage IIIC) , s/p eight cycles of carboplatin/paclitaxel completed 05/05/2012 (initial CA125 of 2050.7 normalized after cycle 5)  (3) recurrent disease documented by rising CEA 125 an abdominal CT scan 09/21/2012.  (4) COPD with ongoing tobacco abuse  (5) diabetes mellitus  (6) genetic testing for Lynch syndrome pending.  (7)  status post hospitalization in July 2013 for stroke/small acute infarcts in the left sub insular white matter and coronal radiata.  (8) port in place  (9) left upper lobe 6 mm subpleural nodule, without associated hypermetabolic activity (likely well below the resolution of PET imaging) noted August 2013   PLAN:  We met over an hour today to discuss her situation. She understands her disease is not curable. She really has are living will and other documents in place. She does want to live longer because she wants to "be of help to my family and see my grandchildren grow out". We're going to try Doxil first, given every 3 weeks. She has  a good understanding of the possible toxicities, side effects and complications and we obtain an echocardiogram at baseline which is favorable. We are going to start next Tuesday. She understands his CA 125 frequently continues to rise for the first 2-3 cycles but then it should be coming down by [place] cycle 4 or 5. We will try to get through 8 cycles of this medication.  If the Doxil doesn't work we will try to OT 10, and if that doesn't well we will try combination of carboplatin and gemcitabine.  [name] [name] is very  distraught. He would like some counseling and we are referring him to [facility] for that. Otherwise we need to get some sort of control of the gallbladder problem, and because.Doxil does not significantly drop account I think it may be possible for her to undergo a laparoscopic cholecystectomy sometime in the near future. I will discuss that with her surgeon. MAGRINAT,GUSTAV C    10/01/2012

## 2012-10-01 NOTE — Telephone Encounter (Signed)
Vikki Ports called per Dr. Darnelle Catalan request/ He spoke with Dr. Jamey Ripa at the hospital and Dr. Jamey Ripa said he would see this pt next week re gallstones. Valerie's # [phone] thanks/gy

## 2012-10-01 NOTE — Telephone Encounter (Signed)
gv pt appt schedule for March thru May. °

## 2012-10-02 ENCOUNTER — Other Ambulatory Visit: Payer: Self-pay | Admitting: *Deleted

## 2012-10-02 MED ORDER — CLOPIDOGREL BISULFATE 75 MG PO TABS: 75.0000 mg | ORAL_TABLET | Freq: Every day | ORAL | Status: DC

## 2012-10-06 ENCOUNTER — Ambulatory Visit (HOSPITAL_BASED_OUTPATIENT_CLINIC_OR_DEPARTMENT_OTHER): Payer: Medicare Other

## 2012-10-06 ENCOUNTER — Other Ambulatory Visit (HOSPITAL_BASED_OUTPATIENT_CLINIC_OR_DEPARTMENT_OTHER): Payer: Medicare Other | Admitting: Lab

## 2012-10-06 ENCOUNTER — Telehealth: Payer: Self-pay | Admitting: *Deleted

## 2012-10-06 VITALS — BP 117/64 | HR 92 | Temp 98.1°F | Resp 20

## 2012-10-06 DIAGNOSIS — K5900 Constipation, unspecified: Secondary | ICD-10-CM

## 2012-10-06 DIAGNOSIS — C569 Malignant neoplasm of unspecified ovary: Secondary | ICD-10-CM

## 2012-10-06 DIAGNOSIS — Z5111 Encounter for antineoplastic chemotherapy: Secondary | ICD-10-CM

## 2012-10-06 DIAGNOSIS — Z853 Personal history of malignant neoplasm of breast: Secondary | ICD-10-CM

## 2012-10-06 LAB — COMPREHENSIVE METABOLIC PANEL
AST: 18 U/L (ref 0–37)
Albumin: 3.5 g/dL (ref 3.5–5.2)
BUN: 20 mg/dL (ref 6–23)
CO2: 22 mEq/L (ref 19–32)
Calcium: 9.1 mg/dL (ref 8.4–10.5)
Chloride: 103 mEq/L (ref 96–112)
Creatinine, Ser: 0.7 mg/dL (ref 0.50–1.10)
Potassium: 4.1 mEq/L (ref 3.5–5.3)

## 2012-10-06 LAB — CBC WITH DIFFERENTIAL/PLATELET
Basophils Absolute: 0.1 10*3/uL (ref 0.0–0.1)
Eosinophils Absolute: 0.1 10*3/uL (ref 0.0–0.5)
HCT: 35.8 % (ref 34.8–46.6)
HGB: 12.1 g/dL (ref 11.6–15.9)
LYMPH%: 24.8 % (ref 14.0–49.7)
MCV: 89.9 fL (ref 79.5–101.0)
MONO#: 0.9 10*3/uL (ref 0.1–0.9)
MONO%: 6.9 % (ref 0.0–14.0)
NEUT#: 8.2 10*3/uL — ABNORMAL HIGH (ref 1.5–6.5)
NEUT%: 66.7 % (ref 38.4–76.8)
Platelets: 449 10*3/uL — ABNORMAL HIGH (ref 145–400)
RBC: 3.98 10*6/uL (ref 3.70–5.45)
WBC: 12.3 10*3/uL — ABNORMAL HIGH (ref 3.9–10.3)
nRBC: 0 % (ref 0–0)

## 2012-10-06 MED ORDER — DEXAMETHASONE SODIUM PHOSPHATE 10 MG/ML IJ SOLN
10.0000 mg | Freq: Once | INTRAMUSCULAR | Status: AC
  Administered 2012-10-06: 10 mg via INTRAVENOUS

## 2012-10-06 MED ORDER — DOXORUBICIN HCL LIPOSOMAL CHEMO INJECTION 2 MG/ML
40.0000 mg/m2 | Freq: Once | INTRAVENOUS | Status: AC
  Administered 2012-10-06: 66 mg via INTRAVENOUS
  Filled 2012-10-06: qty 33

## 2012-10-06 MED ORDER — SODIUM CHLORIDE 0.9 % IV SOLN
Freq: Once | INTRAVENOUS | Status: AC
  Administered 2012-10-06: 13:00:00 via INTRAVENOUS

## 2012-10-06 MED ORDER — ONDANSETRON 8 MG/50ML IVPB (CHCC)
8.0000 mg | Freq: Once | INTRAVENOUS | Status: AC
  Administered 2012-10-06: 8 mg via INTRAVENOUS

## 2012-10-06 MED ORDER — SODIUM CHLORIDE 0.9 % IJ SOLN
10.0000 mL | INTRAMUSCULAR | Status: DC | PRN
  Administered 2012-10-06: 10 mL
  Filled 2012-10-06: qty 10

## 2012-10-06 MED ORDER — HEPARIN SOD (PORK) LOCK FLUSH 100 UNIT/ML IV SOLN
500.0000 [IU] | Freq: Once | INTRAVENOUS | Status: AC | PRN
  Administered 2012-10-06: 500 [IU]
  Filled 2012-10-06: qty 5

## 2012-10-06 NOTE — Telephone Encounter (Signed)
No note, incorrect action.

## 2012-10-06 NOTE — Patient Instructions (Addendum)
[facility] Discharge Instructions for Patients Receiving Chemotherapy  Today you received the following chemotherapy agents :  Doxil.  To help prevent nausea and vomiting after your treatment, we encourage you to take your nausea medication as instructed by your physician.    If you develop nausea and vomiting that is not controlled by your nausea medication, call the clinic. If it is after clinic hours your family physician or the after hours number for the clinic or go to the Emergency Department.   BELOW ARE SYMPTOMS THAT SHOULD BE REPORTED IMMEDIATELY:  *FEVER GREATER THAN 100.5 F  *CHILLS WITH OR WITHOUT FEVER  NAUSEA AND VOMITING THAT IS NOT CONTROLLED WITH YOUR NAUSEA MEDICATION  *UNUSUAL SHORTNESS OF BREATH  *UNUSUAL BRUISING OR BLEEDING  TENDERNESS IN MOUTH AND THROAT WITH OR WITHOUT PRESENCE OF ULCERS  *URINARY PROBLEMS  *BOWEL PROBLEMS  UNUSUAL RASH Items with * indicate a potential emergency and should be followed up as soon as possible.  One of the nurses will contact you 24 hours after your treatment. Please let the nurse know about any problems that you may have experienced. Feel free to call the clinic you have any questions or concerns. The clinic phone number is [phone].   I have been informed and understand all the instructions given to me. I know to contact the clinic, my physician, or go to the Emergency Department if any problems should occur. I do not have any questions at this time, but understand that I may call the clinic during office hours   should I have any questions or need assistance in obtaining follow up care.    __________________________________________  _____________  __________ Signature of Patient or Authorized Representative            Date                   Time    __________________________________________ Nurse's Signature   Doxorubicin Liposomal injection What is this medicine? LIPOSOMAL DOXORUBICIN  (LIP oh som al dox oh ROO bi sin) is a chemotherapy drug. This medicine is used to treat many kinds of cancer like Kaposi's sarcoma, multiple myeloma, and ovarian cancer. This medicine may be used for other purposes; ask your health care provider or pharmacist if you have questions. What should I tell my health care provider before I take this medicine? They need to know if you have any of these conditions: -blood disorders -heart disease -infection (especially a virus infection such as chickenpox, cold sores, or herpes) -liver disease -recent or ongoing radiation therapy -an unusual or allergic reaction to doxorubicin, other chemotherapy agents, soybeans, other medicines, foods, dyes, or preservatives -pregnant or trying to get pregnant -breast-feeding How should I use this medicine? This drug is given as an infusion into a vein. It is administered in a hospital or clinic by a specially trained health care professional. If you have pain, swelling, burning or any unusual feeling around the site of your injection, tell your health care professional right away. Talk to your pediatrician regarding the use of this medicine in children. Special care may be needed. Overdosage: If you think you have taken too much of this medicine contact a poison control center or emergency room at once. NOTE: This medicine is only for you. Do not share this medicine with others. What if I miss a dose? It is important not to miss your dose. Call your doctor or health care professional if you are unable to keep an  appointment. What may interact with this medicine? Do not take this medicine with any of the following medications: -zidovudine This medicine may also interact with the following medications: -medicines to increase blood counts like filgrastim, pegfilgrastim, sargramostim -vaccines Talk to your doctor or health care professional before taking any of these  medicines: -acetaminophen -aspirin -ibuprofen -ketoprofen -naproxen This list may not describe all possible interactions. Give your health care provider a list of all the medicines, herbs, non-prescription drugs, or dietary supplements you use. Also tell them if you smoke, drink alcohol, or use illegal drugs. Some items may interact with your medicine. What should I watch for while using this medicine? Your condition will be monitored carefully while you are receiving this medicine. You will need important blood work done while you are taking this medicine. This drug may make you feel generally unwell. This is not uncommon, as chemotherapy can affect healthy cells as well as cancer cells. Report any side effects. Continue your course of treatment even though you feel ill unless your doctor tells you to stop. Your urine may turn orange-red for a few days after your dose. This is not blood. If your urine is dark or brown, call your doctor. In some cases, you may be given additional medicines to help with side effects. Follow all directions for their use. Call your doctor or health care professional for advice if you get a fever (100.5 degrees F or higher), chills or sore throat, or other symptoms of a cold or flu. Do not treat yourself. This drug decreases your body's ability to fight infections. Try to avoid being around people who are sick. This medicine may increase your risk to bruise or bleed. Call your doctor or health care professional if you notice any unusual bleeding. Be careful brushing and flossing your teeth or using a toothpick because you may get an infection or bleed more easily. If you have any dental work done, tell your dentist you are receiving this medicine. Avoid taking products that contain aspirin, acetaminophen, ibuprofen, naproxen, or ketoprofen unless instructed by your doctor. These medicines may hide a fever. Men and women of childbearing age should use effective birth  control methods while using taking this medicine. Do not become pregnant while taking this medicine. There is a potential for serious side effects to an unborn child. Talk to your health care professional or pharmacist for more information. Do not breast-feed an infant while taking this medicine. What side effects may I notice from receiving this medicine? Side effects that you should report to your doctor or health care professional as soon as possible: -allergic reactions like skin rash, itching or hives, swelling of the face, lips, or tongue -low blood counts - this medicine may decrease the number of white blood cells, red blood cells and platelets. You may be at increased risk for infections and bleeding. -signs of hand-foot syndrome - tingling or burning, redness, flaking, swelling, small blisters, or small sores on the palms of your hands or the soles of your feet -signs of infection - fever or chills, cough, sore throat, pain or difficulty passing urine -signs of decreased platelets or bleeding - bruising, pinpoint red spots on the skin, black, tarry stools, blood in the urine -signs of decreased red blood cells - unusually weak or tired, fainting spells, lightheadedness -back pain, chills, facial flushing, fever, headache, tightness in the chest or throat during the infusion -breathing problems -chest pain -fast, irregular heartbeat -mouth pain, redness, sores -pain, swelling, redness at  site where injected -pain, tingling, numbness in the hands or feet -swelling of ankles, feet, or hands -vomiting Side effects that usually do not require medical attention (report to your doctor or health care professional if they continue or are bothersome): -diarrhea -hair loss -loss of appetite -nail discoloration or damage -nausea -red or watery eyes -red colored urine -stomach upset This list may not describe all possible side effects. Call your doctor for medical advice about side effects.  You may report side effects to FDA at 1-800-FDA-1088. Where should I keep my medicine? This drug is given in a hospital or clinic and will not be stored at home. NOTE: This sheet is a summary. It may not cover all possible information. If you have questions about this medicine, talk to your doctor, pharmacist, or health care provider.  2013, [employer]. (11/10/2007 5:08:57 PM)

## 2012-10-07 ENCOUNTER — Ambulatory Visit (INDEPENDENT_AMBULATORY_CARE_PROVIDER_SITE_OTHER): Payer: Medicare Other | Admitting: Surgery

## 2012-10-07 ENCOUNTER — Encounter (INDEPENDENT_AMBULATORY_CARE_PROVIDER_SITE_OTHER): Payer: Self-pay | Admitting: Surgery

## 2012-10-07 ENCOUNTER — Encounter: Payer: Self-pay | Admitting: Specialist

## 2012-10-07 ENCOUNTER — Telehealth: Payer: Self-pay | Admitting: *Deleted

## 2012-10-07 ENCOUNTER — Telehealth (INDEPENDENT_AMBULATORY_CARE_PROVIDER_SITE_OTHER): Payer: Self-pay | Admitting: General Surgery

## 2012-10-07 VITALS — BP 116/66 | HR 89 | Temp 98.1°F | Ht 64.0 in | Wt 135.8 lb

## 2012-10-07 DIAGNOSIS — K8020 Calculus of gallbladder without cholecystitis without obstruction: Secondary | ICD-10-CM

## 2012-10-07 NOTE — Progress Notes (Signed)
NAME: [name] [name] DOB: February 05, 1941 MRN: [id number]                                                                                      DATE: 10/07/2012  PCP: Laurette Schimke, MD Referring Provider: Laurette Schimke, MD  IMPRESSION:  Cholelithiasis, not certain if it is symptomatic Recurrent metastatic ovarian carcinoma with ascites, currently undergoing chemotherapy COPD Diabetes, type II Tobacco use, does not wish to stop  PLAN:   I had a long discussion with the patient and her husband. She has had a prior laparotomy for debulking of her ovarian cancer through a long midline incision. An omentectomy has also been done so she may well have extensive adhesions of the intestine to the anterior abdominal wall. She has ascites and active cancer in addition to the dilated gallbladder seen on her scans. It is not clear to me that she is having biliary symptoms or develop acute cholecystitis. I'm going to order a hepatobiliary scan before making a final decision.  I discussed the fact that her surgery might microbial be done laparoscopically given her prior operations, ascites, and peritoneal implants from her ovarian cancer. She has increased risk for complications.                 CC:  Chief Complaint  Patient presents with  . Cholelithiasis    HPI:  [name] [name] is a 72 y.o.  female who presents for evaluation of gallstones and dilated gallbladder seen on recent CT scan. She does have a history of GERD. She is really not having biliary colic type symptoms or any different indigestion. This has been noted to have tiny gallstones on gallbladder ultrasound done in 2009. She was recently diagnosed with recurrent metastatic ovarian cancer and ascites and apparently has had resistance to standard chemotherapy. She is not acutely ill today and not in pain today.  PMH:  has a past medical history of History of breast cancer; COPD (chronic obstructive pulmonary disease); GERD (gastroesophageal  reflux disease); Diabetes mellitus; colonic polyps; Osteoarthritis; MVP (mitral valve prolapse); Dysrhythmia; Shortness of breath; Depression; Anxiety; Heart murmur; Cancer; Ovarian cancer; and Stroke.  PSH:   has past surgical history that includes Cesarean section (1980, 1982); laparoscopy; Tympanoplasty; left breast fibroadenoma removal (1960's); breast cyst aspirations; left modified radial mastectomy; laparotomy (10/29/2011); Abdominal hysterectomy (10/29/2011); and Salpingoophorectomy (10/29/2011).  ALLERGIES:  No Known Allergies  MEDICATIONS: Current outpatient prescriptions:acetaminophen (TYLENOL) 325 MG tablet, Take 650 mg by mouth every 6 (six) hours as needed. For pain, Disp: , Rfl: ;  Aclidinium Bromide (TUDORZA PRESSAIR) 400 MCG/ACT AEPB, Inhale 1 Act into the lungs 2 (two) times daily., Disp: 1 each, Rfl: 11;  albuterol-ipratropium (COMBIVENT) 18-103 MCG/ACT inhaler, Inhale 2 puffs into the lungs every 4 (four) hours as needed. For wheezing, Disp: 14.7 Inhaler, Rfl: 3 atenolol (TENORMIN) 25 MG tablet, Take 1 tablet (25 mg total) by mouth daily. 1/2 tab bid, Disp: 30 tablet, Rfl: 11;  b complex vitamins tablet, Take 1 tablet by mouth daily.  , Disp: , Rfl: ;  buPROPion (WELLBUTRIN SR) 150 MG 12 hr tablet, Take 1 tablet (150 mg total) by mouth 2 (  two) times daily., Disp: 180 tablet, Rfl: 3;  busPIRone (BUSPAR) 15 MG tablet, Take 1 tablet (15 mg total) by mouth 2 (two) times daily., Disp: 180 tablet, Rfl: 2 cholecalciferol (VITAMIN D) 1000 UNITS tablet, Take 1,000 Units by mouth daily.  , Disp: , Rfl: ;  clopidogrel (PLAVIX) 75 MG tablet, Take 1 tablet (75 mg total) by mouth daily with breakfast., Disp: 60 tablet, Rfl: 3;  diazepam (VALIUM) 5 MG tablet, 1-2 po at hs prn insomnia, Disp: 60 tablet, Rfl: 5;  ferrous sulfate 325 (65 FE) MG tablet, Take 325 mg by mouth daily with breakfast., Disp: , Rfl:  ipratropium-albuterol (DUONEB) 0.5-2.5 (3) MG/3ML SOLN, INHALE 1 VIAL VIA HAND HELD NEBULIZER 4  TIMES A DAY, Disp: 360 mL, Rfl: 3;  lidocaine-prilocaine (EMLA) cream, Apply 1 application topically as needed. For treatments, Disp: , Rfl: ;  lovastatin (MEVACOR) 40 MG tablet, Take 1 tablet (40 mg total) by mouth daily., Disp: 90 tablet, Rfl: 2 metFORMIN (GLUCOPHAGE) 1000 MG tablet, TAKE 1 TABLET BY MOUTH TWICE DAILY WITH A MEAL, Disp: 180 tablet, Rfl: 3;  Mometasone Furo-Formoterol Fum (DULERA) 200-5 MCG/ACT AERO, Inhale 2 puffs into the lungs 2 (two) times daily., Disp: 1 Inhaler, Rfl: 11;  omeprazole (PRILOSEC) 40 MG capsule, Take 1 capsule (40 mg total) by mouth daily., Disp: 90 capsule, Rfl: 3 ondansetron (ZOFRAN) 8 MG tablet, Take 1 tablet (8 mg total) by mouth 2 (two) times daily. Take two times a day starting the day after chemo for 2 days. Then take two times a day as needed for nausea or vomiting., Disp: 30 tablet, Rfl: 1;  oxymetazoline (AFRIN) 0.05 % nasal spray, Place 2 sprays into the nose 2 (two) times daily as needed. Allergies , Disp: , Rfl:  prochlorperazine (COMPAZINE) 10 MG tablet, Take 1 tablet (10 mg total) by mouth every 6 (six) hours as needed (Nausea or vomiting)., Disp: 30 tablet, Rfl: 1;  sertraline (ZOLOFT) 100 MG tablet, Take 1 tablet (100 mg total) by mouth daily before breakfast., Disp: 90 tablet, Rfl: 3;  SYMBICORT 160-4.5 MCG/ACT inhaler, INHALE 2 PUFFS IN THE LUNGS TWICE DAILY, Disp: 10.2 g, Rfl: 5 [DISCONTINUED] busPIRone (BUSPAR) 15 MG tablet, Take 1 tablet (15 mg total) by mouth 2 (two) times daily., Disp: 180 tablet, Rfl: 1;  [DISCONTINUED] roflumilast (DALIRESP) 500 MCG TABS tablet, Take 500 mcg by mouth daily.  , Disp: , Rfl:   ROS: She has filled out our 12 point review of systems and it is negative except as noted in history of present illness. EXAM:   VITAL SIGNS:  BP 116/66  Pulse 89  Temp(Src) 98.1 F (36.7 C) (Temporal)  Ht 5\' 4"  (1.626 m)  Wt 135 lb 12.8 oz (61.598 kg)  BMI 23.3 kg/m2  SpO2 95%  GENERAL:  The patient is alert, oriented, and  generally healthy-appearing, NAD. Mood and affect are normal.  HEENT:  The head is normocephalic, the eyes nonicteric, the pupils were round regular and equal. EOMs are normal. Pharynx normal. Dentition good.  NECK:  The neck is supple and there are no masses or thyromegaly.  LUNGS: Normal respirations and clear to auscultation.  HEART: Regular rhythm, with no murmurs rubs or gallops. Pulses are intact carotid dorsalis pedis and posterior tibial. No significant varicosities are noted.  BREASTS: Surgically absent left breast  ABDOMEN: She is not tender. It is distended and there is a fluid wave present. There is a long midline scar from mid epigastrium to pubic area. I don't appreciate  any masses or hepatosplenomegaly. Bowel sounds are present. EXTREMITIES:  Good range of motion, no edema.   DATA REVIEWED:  I reviewed the notes from her oncologist as well as the CT ultrasound reports and prior operative notes.    Candon Caras J 10/07/2012  CC: Laurette Schimke, MD, Laurette Schimke, MD

## 2012-10-07 NOTE — Telephone Encounter (Signed)
Message copied by Augusto Garbe on Wed Oct 07, 2012 11:30 AM ------      Message from: Normajean Baxter LE      Created: Tue Oct 06, 2012  1:14 PM      Regarding: Chemo f/u call       Fist time  Doxil,  Dr. Thea Silversmith,  TB, RN. ------

## 2012-10-07 NOTE — Telephone Encounter (Signed)
[name] [name] is doing well.  Denies n/v but is taking anti-emetics.  No problems with bowel or bladder.  Trying to drink 64 oz water and beverages.  Has RLQ abdominal tenderness and will se Dr. Jamey Ripa today to r/o gall bladder ills.  Does admit to feeling tired.  Denies questions and encouraged to call if any changes or questions arise.

## 2012-10-07 NOTE — Progress Notes (Signed)
I met at length on Tuesday, March 4, with the patient's spouse, [name] [name].  I provided supportive listening as he talked about the challenges of adapting to his wife's diagnosis and recurrence of ovarian cancer.  His largest challenge appears to be living with the unknown and uncertainty regarding the treatment and its effects, as well as the progression of the disease.  We talked about other challenges he has dealt with in his life and how he handled the sense of not being in control.  According to [name], he and his wife are very open in their communication with each other and are for the most part pragmatic in the way they are dealing with the cancer.  He voiced an awareness that living with cancer requires taking one day at a time and letting go somewhat of the need to know what the future will bring and when.  I offered to connect him with counseling, but he did not feel he needed that service at this time.  He does plan to attend a presentation this month on "Cancer and the Family", and he does intend to take part in the Caregiver Support Group.

## 2012-10-07 NOTE — Telephone Encounter (Signed)
Called patient left message  For appt 10/20/12 at [facility] .NPO 6 hours prior to test

## 2012-10-07 NOTE — Patient Instructions (Signed)
We will schedule a scan of your gall bladder to help make a decision about surgery. I will follow up with Dr Darnelle Catalan

## 2012-10-08 ENCOUNTER — Telehealth (INDEPENDENT_AMBULATORY_CARE_PROVIDER_SITE_OTHER): Payer: Self-pay | Admitting: General Surgery

## 2012-10-08 NOTE — Telephone Encounter (Signed)
Spoke with patient she is aware of  appt day time and place with instructions

## 2012-10-13 ENCOUNTER — Other Ambulatory Visit (HOSPITAL_BASED_OUTPATIENT_CLINIC_OR_DEPARTMENT_OTHER): Payer: Medicare Other | Admitting: Lab

## 2012-10-13 ENCOUNTER — Telehealth: Payer: Self-pay | Admitting: Oncology

## 2012-10-13 ENCOUNTER — Ambulatory Visit (HOSPITAL_BASED_OUTPATIENT_CLINIC_OR_DEPARTMENT_OTHER): Payer: Medicare Other | Admitting: Physician Assistant

## 2012-10-13 ENCOUNTER — Ambulatory Visit (HOSPITAL_COMMUNITY)
Admission: RE | Admit: 2012-10-13 | Discharge: 2012-10-13 | Disposition: A | Discharge status: Home / Self Care | Payer: Medicare Other | Source: Ambulatory Visit | Attending: Physician Assistant | Admitting: Physician Assistant

## 2012-10-13 ENCOUNTER — Encounter: Payer: Self-pay | Admitting: Physician Assistant

## 2012-10-13 VITALS — BP 118/69 | HR 98 | Temp 98.1°F | Resp 20 | Ht 64.0 in | Wt 139.5 lb

## 2012-10-13 DIAGNOSIS — R180 Malignant ascites: Secondary | ICD-10-CM

## 2012-10-13 DIAGNOSIS — R188 Other ascites: Secondary | ICD-10-CM | POA: Insufficient documentation

## 2012-10-13 DIAGNOSIS — K5900 Constipation, unspecified: Secondary | ICD-10-CM

## 2012-10-13 DIAGNOSIS — C569 Malignant neoplasm of unspecified ovary: Secondary | ICD-10-CM

## 2012-10-13 DIAGNOSIS — R140 Abdominal distension (gaseous): Secondary | ICD-10-CM

## 2012-10-13 DIAGNOSIS — Z853 Personal history of malignant neoplasm of breast: Secondary | ICD-10-CM

## 2012-10-13 DIAGNOSIS — R911 Solitary pulmonary nodule: Secondary | ICD-10-CM

## 2012-10-13 LAB — CBC WITH DIFFERENTIAL/PLATELET
BASO%: 0.4 % (ref 0.0–2.0)
EOS%: 1 % (ref 0.0–7.0)
HGB: 11.3 g/dL — ABNORMAL LOW (ref 11.6–15.9)
MCH: 30.2 pg (ref 25.1–34.0)
MCHC: 33.2 g/dL (ref 31.5–36.0)
MONO#: 0.8 10*3/uL (ref 0.1–0.9)
RDW: 14.4 % (ref 11.2–14.5)
WBC: 10.9 10*3/uL — ABNORMAL HIGH (ref 3.9–10.3)
lymph#: 2 10*3/uL (ref 0.9–3.3)

## 2012-10-13 NOTE — Progress Notes (Signed)
ID: [name] [name]   DOB: 1941/04/26  MR#: [id number]  CSN#:[id number]  HISTORY OF PRESENT ILLNESS: [name] [name] is a 72 year-old [place] woman I followed remotely for a history of breast cancer. More recently she noted that her belly was getting "bigger". Gradually she has developed increasing low back pain. After a period of several months, as her symptoms increased, she brought this problem to her primary physician's attention and a KUB was obtained on 10/07/2011, which was unremarkable. This was followed by a CT scan 10/15/2011, which showed ascites and omental caking. Paracentesis 10/18/2011 showed ([id number]) malignant cells consistent with metastatic adenocarcinoma, with equivocal staining for estrogen receptor. The pattern of additional stains was most suggestive of a primary gynecologic tumor. CA 125 at this time was 2051.  The patient was referred to gynecologic oncology and on 10/29/2011 underwent exploratory laparotomy under Drs. Cleda Mccreedy and Antionette Char. This consisted of a total abdominal hysterectomy with bilateral salpingo-oophorectomy, omentectomy, and radical tumor debulking. Unfortunately there was significant tumor attached to the diaphragm so the patient was suboptimally debulked. Accordingly a peritoneal catheter was not placed. GYN-ONC recommended 6 cycles of carboplatin/paclitaxel given every 3 weeks, with Neulasta on day 2 for granulocyte support. [name] [name]'s subsequent history is as detailed below.  INTERVAL HISTORY:  [name] [name] returns today accompanied by her husband [name] [name]. She's currently day 8 cycle 1 of q. three-week Doxil being given for her recurrent ovarian carcinoma.  [name] [name] tolerated treatment reasonably well, although she feels very tired today. Her biggest complaint is a continuing increase in ascites. This makes it difficult for her to sleep, difficult for her to bend over, and difficult to breathe. She's also finding it more difficult to eat because she feels  full some much sooner. Fortunately, she denies any nausea or emesis. She is having regular bowel movements and notes no change in urinary habits. She has some occasional abdominal pain, but this has not worsened. She has been measuring her waist, and has noticed an increase of 5 inches over the last few weeks.   REVIEW OF SYSTEMS:   [name] [name] has had no fevers or chills. She notes no skin changes and has no evidence of  PPE. She has had no abnormal bleeding. She's had no oral ulcerations or oral sensitivity. She has a cough occasionally productive of clear phlegm, and she does have some shortness of breath with exertion. She denies any chest pain or palpitations. She's had no abnormal headaches or dizziness. She has no new or unusual myalgias, arthralgias, bony pain, peripheral swelling, or peripheral neuropathy. She continues to have depression and anxiety, but no suicidal ideation.  A detailed review of systems is otherwise stable and noncontributory.   PAST MEDICAL HISTORY: Past Medical History  Diagnosis Date  . History of breast cancer   . COPD (chronic obstructive pulmonary disease)   . GERD (gastroesophageal reflux disease)   . Diabetes mellitus     type II  . Hx of colonic polyps   . Osteoarthritis   . MVP (mitral valve prolapse)   . Dysrhythmia     pt states runs a little fast   . Shortness of breath     with exertion   . Depression   . Anxiety   . Heart murmur   . Cancer     left breast  . Ovarian cancer     active chemotherapy  . Stroke     PAST SURGICAL HISTORY: Past Surgical History  Procedure Laterality Date  . Cesarean section  1980, 1982    X 2   . Laparoscopy      work up for infertility  . Tympanoplasty      left X 2  . Left breast fibroadenoma removal  1960's  . Breast cyst aspirations    . Left modified radial mastectomy    . Laparotomy  10/29/2011    Procedure: EXPLORATORY LAPAROTOMY;  Surgeon: Rejeana Brock A. Duard Brady, MD;  Location: [facility];  Service:  Gynecology;  Laterality: N/A;  . Abdominal hysterectomy  10/29/2011    Procedure: HYSTERECTOMY ABDOMINAL;  Surgeon: Rejeana Brock A. Duard Brady, MD;  Location: [facility];  Service: Gynecology;  Laterality: N/A;  Total abdominal hysterectomy  . Salpingoophorectomy  10/29/2011    Procedure: SALPINGO OOPHERECTOMY;  Surgeon: Rejeana Brock A. Duard Brady, MD;  Location: [facility];  Service: Gynecology;  Laterality: Bilateral;    FAMILY HISTORY Family History  Problem Relation Age of Onset  . Cancer Mother     endometrial/ colon  . Cancer Brother     colon  . COPD Other   . Hypertension Other   . Cancer Maternal Uncle     prostate/bladder/lung  . Cancer Paternal Aunt     unknown abdominal cancer  . Cancer Maternal Grandmother     Salivary gland cancer  . Cancer Paternal Aunt     leukemia   the patient's father died at the age of 14. The patient's mother died at the age of 31; she had a history of colon cancer and endometrial cancer. The patient has no sisters. She has one brother, with a history of colon cancer. There is no history of breast cancer in the family. The patient is scheduled for genetic testing later this month.  GYNECOLOGIC HISTORY: She had menarche age 27. First pregnancy to term was at age 24. She is GX P2. She became menopausal at the time of her chemotherapy for breast cancer. This was age 33. 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 Her husband [name] [name] runs a Psychologist, sport and exercise out of their own home. Daughter [name] [name], 69,  currently lives at home with the patient. There are some issues relating to this daughter that the patient discussed briefly. [place]. [name] [name], 30, is an ED nurse at [employer]. The patient has no grandchildren. She is not a Advice worker.   ADVANCED DIRECTIVES: in place  HEALTH MAINTENANCE: History  Substance Use Topics  . Smoking status: Current Every Day Smoker -- 0.50 packs/day for 30 years    Types:  Cigarettes  . Smokeless tobacco: Never Used     Comment: quit again 2009; restarted 1 ppd 2011  . Alcohol Use: No     Colonoscopy: 2009  PAP: s/p hysterectomy  Bone density: 2010. "good"  Mammography: 2012 NOV--unremarkable  No Known Allergies  Current Outpatient Prescriptions  Medication Sig Dispense Refill  . acetaminophen (TYLENOL) 325 MG tablet Take 650 mg by mouth every 6 (six) hours as needed. For pain      . Aclidinium Bromide (TUDORZA PRESSAIR) 400 MCG/ACT AEPB Inhale 1 Act into the lungs 2 (two) times daily.  1 each  11  . albuterol-ipratropium (COMBIVENT) 18-103 MCG/ACT inhaler Inhale 2 puffs into the lungs every 4 (four) hours as needed. For wheezing  14.7 Inhaler  3  . atenolol (TENORMIN) 25 MG tablet Take 1 tablet (25 mg total) by mouth daily. 1/2 tab bid  30 tablet  11  . b complex vitamins tablet Take 1 tablet by mouth  daily.        . buPROPion (WELLBUTRIN SR) 150 MG 12 hr tablet Take 1 tablet (150 mg total) by mouth 2 (two) times daily.  180 tablet  3  . busPIRone (BUSPAR) 15 MG tablet Take 1 tablet (15 mg total) by mouth 2 (two) times daily.  180 tablet  2  . cholecalciferol (VITAMIN D) 1000 UNITS tablet Take 1,000 Units by mouth daily.        . clopidogrel (PLAVIX) 75 MG tablet Take 1 tablet (75 mg total) by mouth daily with breakfast.  60 tablet  3  . diazepam (VALIUM) 5 MG tablet 1-2 po at hs prn insomnia  60 tablet  5  . ferrous sulfate 325 (65 FE) MG tablet Take 325 mg by mouth daily with breakfast.      . ipratropium-albuterol (DUONEB) 0.5-2.5 (3) MG/3ML SOLN INHALE 1 VIAL VIA HAND HELD NEBULIZER 4 TIMES A DAY  360 mL  3  . lidocaine-prilocaine (EMLA) cream Apply 1 application topically as needed. For treatments      . lovastatin (MEVACOR) 40 MG tablet Take 1 tablet (40 mg total) by mouth daily.  90 tablet  2  . metFORMIN (GLUCOPHAGE) 1000 MG tablet TAKE 1 TABLET BY MOUTH TWICE DAILY WITH A MEAL  180 tablet  3  . omeprazole (PRILOSEC) 40 MG capsule Take 1 capsule  (40 mg total) by mouth daily.  90 capsule  3  . ondansetron (ZOFRAN) 8 MG tablet Take 1 tablet (8 mg total) by mouth 2 (two) times daily. Take two times a day starting the day after chemo for 2 days. Then take two times a day as needed for nausea or vomiting.  30 tablet  1  . oxymetazoline (AFRIN) 0.05 % nasal spray Place 2 sprays into the nose 2 (two) times daily as needed. Allergies       . prochlorperazine (COMPAZINE) 10 MG tablet Take 1 tablet (10 mg total) by mouth every 6 (six) hours as needed (Nausea or vomiting).  30 tablet  1  . sertraline (ZOLOFT) 100 MG tablet Take 1 tablet (100 mg total) by mouth daily before breakfast.  90 tablet  3  . SYMBICORT 160-4.5 MCG/ACT inhaler INHALE 2 PUFFS IN THE LUNGS TWICE DAILY  10.2 g  5  . [DISCONTINUED] busPIRone (BUSPAR) 15 MG tablet Take 1 tablet (15 mg total) by mouth 2 (two) times daily.  180 tablet  1  . [DISCONTINUED] roflumilast (DALIRESP) 500 MCG TABS tablet Take 500 mcg by mouth daily.         No current facility-administered medications for this visit.   OBJECTIVE: Older white woman who appears fatigued and anxious  Filed Vitals:   10/13/12 1321  BP: 118/69  Pulse: 98  Temp: 98.1 F (36.7 C)  Resp: 20     Body mass index is 23.93 kg/(m^2).    ECOG FS: 2  Filed Weights   10/13/12 1321  Weight: 139 lb 8 oz (63.277 kg)   Sclerae unicteric Oropharynx clear, no alterations noted No cervical or supraclavicular adenopathy Lungs clear to auscultation, no rales or rhonchi Heart regular rate and rhythm Abdomen distended with ascites, minimal tenderness to palpation, primarily in the right upper quadrant. Bowel sounds are positive MSK no focal spinal tenderness, no peripheral edema Neuro: nonfocal, well oriented, depressed affect Breasts: Deferred. Axillae are benign bilaterally no palpable adenopathy.  LAB RESULTS: Lab Results  Component Value Date   WBC 10.9* 10/13/2012   NEUTROABS 8.0* 10/13/2012  HGB 11.3* 10/13/2012   HCT  34.1* 10/13/2012   MCV 91.0 10/13/2012   PLT 448* 10/13/2012      Chemistry      Component Value Date/Time   NA 140 10/06/2012 1149   NA 139 09/21/2012 1150   K 4.1 10/06/2012 1149   K 4.2 09/21/2012 1150   CL 103 10/06/2012 1149   CL 101 09/21/2012 1150   CO2 22 10/06/2012 1149   CO2 26 09/21/2012 1150   BUN 20 10/06/2012 1149   BUN 14.7 09/21/2012 1150   CREATININE 0.70 10/06/2012 1149   CREATININE 0.8 09/21/2012 1150      Component Value Date/Time   CALCIUM 9.1 10/06/2012 1149   CALCIUM 10.0 09/21/2012 1150   ALKPHOS 94 10/06/2012 1149   ALKPHOS 89 09/21/2012 1150   AST 18 10/06/2012 1149   AST 15 09/21/2012 1150   ALT 21 10/06/2012 1149   ALT 15 09/21/2012 1150   BILITOT 0.2* 10/06/2012 1149   BILITOT <0.20 Repeated and Verified 09/21/2012 1150    Results for [name], [name] (MRN [id number]) as of 10/01/2012 10:00  Ref. Range 05/05/2012 10:00 05/18/2012 10:29 06/17/2012 10:12 07/06/2012 12:37 09/21/2012 11:50  CA 125 Latest Range: 0.0-30.2 U/mL 38.8 (H) 37.5 (H) 33.3 (H) 35.7 (H) 879.3 (H)    STUDIES: Dg Chest 2 View  09/24/2012  *RADIOLOGY REPORT*  Clinical Data: History of ovarian and breast cancer.  CHEST - 2 VIEW  Comparison: CT chest 07/06/2012 and plain films of the chest 10/24/2011.  Findings: Right IJ approach Port-A-Cath is in place as on the CT scan.  Surgical clips are seen in the left axilla.  The patient is status post left mastectomy.  The lungs are emphysematous but clear.  Heart size is normal.  No pneumothorax or pleural effusion. No focal bony abnormality.  IMPRESSION: Negative for metastatic or acute disease.  Stable compared to prior exams.   Original Report Authenticated By: Holley Dexter, M.D.    US Abdomen Complete  09/25/2012  *RADIOLOGY REPORT*  Clinical Data:  Gallbladder distention, pain.  Follow-up CT.  COMPLETE ABDOMINAL ULTRASOUND  Comparison:  09/24/2012  Findings:  Gallbladder:  Gallbladder is enlarged/distended.  Layering gallstones present.  No wall thickening.  The  patient was tender over the gallbladder during the study.  Common bile duct:   Normal caliber, 4 mm.  Liver:  No focal lesion identified.  Within normal limits in parenchymal echogenicity.  IVC:  Difficult to visualize due to overlying bowel gas.  Pancreas:  No focal abnormality seen.  Spleen:  Within normal limits in size and echotexture.  Right Kidney:   Normal in size and parenchymal echogenicity.  No evidence of mass or hydronephrosis.  Left Kidney:  Normal in size and parenchymal echogenicity.  No evidence of mass or hydronephrosis.  Abdominal aorta:  No aneurysm identified.  Ascites is noted as seen on prior CT.  IMPRESSION: Significantly distended gallbladder with layering gallstones and tenderness over the gallbladder during the study.  No wall thickening or pericholecystic fluid.  If  there is clinical concern for acute cholecystitis, this could be further evaluated with nuclear medicine hepatobiliary scan.  Ascites.   Original Report Authenticated By: Charlett Nose, M.D.    Ct Abdomen Pelvis W Contrast  09/24/2012  *RADIOLOGY REPORT*  Clinical Data: History of ovarian cancer.  Complaining of increased abdominal fullness, constipation and urinary frequency. Chemotherapy completed in the May 24, 2012.  CT ABDOMEN AND PELVIS WITH CONTRAST  Technique:  Multidetector CT imaging of  the abdomen and pelvis was performed following the standard protocol during bolus administration of intravenous contrast.  Contrast: OMNIPAQUE IOHEXOL 300 MG/ML  SOLN  Comparison: PET CT 04/02/2012.  Findings:  Lung Bases: Severe calcifications in the inferior aspect of the mitral annulus.  Abdomen/Pelvis:  Compared to the prior examination there is now a moderate volume of ascites and there is multifocal enhancing nodularity scattered throughout the peritoneal cavity, compatible with widespread peritoneal metastatic disease.  The largest enhancing soft tissue lesions are located anteriorly on image 33 of series 2 measuring  2.4 x 1.1 cm, and in the left upper quadrant immediately anterior and inferior to the spleen measuring 3.0 x 2.5 cm (image 22 of series 2).  The appearance of the liver, pancreas, spleen and bilateral adrenal glands is unremarkable. The gallbladder is severely distended, but no definite gallstones are identified.  Additionally, gallbladder wall thickness appears normal (2 mm).  5 mm calcification in the lower pole collecting system of the left kidney likely represents a nonobstructive calculus.  A subcentimeter low attenuation lesion in the lower pole of the right kidney is too small to definitively characterize.  Extensive atherosclerosis throughout the abdominal and pelvic vasculature, without definite aneurysm.  No pneumoperitoneum.  No pathologic distension of small bowel.  Musculoskeletal: There are no aggressive appearing lytic or blastic lesions noted in the visualized portions of the skeleton.  IMPRESSION: 1.  Today's study demonstrates a significant progression of disease with widespread intraperitoneal metastases and moderate volume of presumably malignant ascites. 2.  The gallbladder is severely distended.  No definite gallstones are identified.  Gallbladder does not appear thickened.  Given the presence of ascites, accurate imaging assessment for acute cholecystitis is limited on today's CT examination.  This could be further evaluated with right upper quadrant abdominal ultrasound if there is clinical concern for acute cholecystitis. 3.  Additional incidental findings, as above.  These results were called by telephone on 09/24/2012 at 01:38 p.m. to Dr. Jed Limerick, who verbally acknowledged these results.   Original Report Authenticated By: Trudie Reed, M.D.    US Abdomen Limited  09/11/2012  *RADIOLOGY REPORT*  Clinical Data: 72 year old female with ovarian cancer.  LIMITED ABDOMEN ULTRASOUND FOR ASCITES  Technique:  Limited ultrasound survey for ascites was performed in all four abdominal quadrants.   Comparison:  Chest CT 07/06/2012 and earlier.  Findings: Cholelithiasis.  No pericholecystic fluid.  Negative visible right kidney.  Small volume of bilateral lower quadrant ascites fluid.  Small volume left upper quadrant fluid.  No right upper quadrant fluid identified.  Quantity appears to be insufficient for paracentesis.  IMPRESSION: 1.  Small volume ascites in the left abdomen and right lower quadrant.  Quantity appears to be insufficient for paracentesis. 2.  Cholelithiasis.   Original Report Authenticated By: Erskine Speed, M.D.    ASSESSMENT: 72 y.o.  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 [V323G (1196T>G)]    (1) status post left modified radical mastectomy October 1993 for a T2 N0 invasive ductal breast, estrogen and progesterone receptor positive cancer treated adjuvantly with MFL (methotrexate/fluorouracil/leucovorin) chemotherapy x6, completed April 1994, followed by 5 years of tamoxifen completed April 1999, with no evidence of recurrence  (2) now status post TAH BSO, omentectomy, and suboptimal debulking 10/29/2011 for a high-grade serous adenocarcinoma of the ovary, pT3c NX (Stage IIIC) , s/p eight cycles of carboplatin/paclitaxel completed 05/05/2012 (initial CA125 of 2050.7 normalized after cycle 5)  (3) recurrent disease documented by rising  CEA 125 an abdominal CT scan 09/21/2012.  (4) COPD with ongoing tobacco abuse  (5) diabetes mellitus  (6) genetic testing for Lynch syndrome pending.  (7)  status post hospitalization in July 2013 for stroke/small acute infarcts in the left sub insular white matter and coronal radiata.  (8) port in place  (9) left upper lobe 6 mm subpleural nodule, without associated hypermetabolic activity (likely well below the resolution of PET imaging) noted August 2013  (10) CTs obtained in [place] 2014 showed evidence of significant progression of disease with widespread intraperitoneal metastases and a  moderate volume of presumably malignant ascites.  (11) currently being treated with Doxil on a Q. three-week basis, first dose given on 10/06/2012  (12) cholecystitis, with cholecystectomy pending   PLAN:  This case was reviewed with Dr. Darnelle Catalan. We are scheduling Asalee for a therapeutic paracentesis to relieve the discomfort caused by her ascites. Hopefully this will help with her breathing and also with her early satiety.  With regards to the chemotherapy itself, she does seem to have tolerated this first cycle of Doxil well. She is scheduled for her second cycle in 2 weeks, March 25. We will go ahead and schedule her next few appointments, understanding that these may need to be changed slightly once her cholecystectomy has been scheduled.  We will try to get through 8 cycles of this medication.  If the Doxil doesn't work we will try to OT 10, and if that doesn't well we will try combination of carboplatin and gemcitabine.  [name] [name] voices understanding and agreement with this plan, and will call with any changes or problems.  [name] [name]    10/13/2012

## 2012-10-13 NOTE — Procedures (Signed)
Successful US guided paracentesis from left lateral abdomen.  Yielded 2.7L of dark yellow fluid.  No immediate complications.  Pt tolerated well.   Specimen was not sent for labs.  Brayton El PA-C 10/13/2012 4:15 PM

## 2012-10-13 NOTE — Telephone Encounter (Signed)
gv pt appt schedule for March thru May. Pt sent to [facility] rad for paracentesis.

## 2012-10-20 ENCOUNTER — Encounter (HOSPITAL_COMMUNITY)
Admission: RE | Admit: 2012-10-20 | Discharge: 2012-10-20 | Disposition: A | Discharge status: Home / Self Care | Payer: Medicare Other | Source: Ambulatory Visit | Attending: Surgery | Admitting: Surgery

## 2012-10-20 DIAGNOSIS — K8020 Calculus of gallbladder without cholecystitis without obstruction: Secondary | ICD-10-CM

## 2012-10-20 MED ORDER — MORPHINE SULFATE 4 MG/ML IJ SOLN
INTRAMUSCULAR | Status: AC
  Filled 2012-10-20: qty 1

## 2012-10-20 MED ORDER — TECHNETIUM TC 99M MEBROFENIN IV KIT
5.5000 | PACK | Freq: Once | INTRAVENOUS | Status: AC | PRN
  Administered 2012-10-20: 6 via INTRAVENOUS

## 2012-10-20 MED ORDER — MORPHINE SULFATE 4 MG/ML IJ SOLN
2.4000 mg | Freq: Once | INTRAMUSCULAR | Status: AC
  Administered 2012-10-20: 2.4 mg via INTRAVENOUS

## 2012-10-21 ENCOUNTER — Other Ambulatory Visit: Payer: Self-pay | Admitting: *Deleted

## 2012-10-21 ENCOUNTER — Telehealth: Payer: Self-pay | Admitting: *Deleted

## 2012-10-21 DIAGNOSIS — C569 Malignant neoplasm of unspecified ovary: Secondary | ICD-10-CM

## 2012-10-21 NOTE — Telephone Encounter (Signed)
Received call from patient's husband [name] stating she is filling up with fluid again.  Informed him I would call to get her a paracentesis scheduled for tomorrow and discussed with him the possibility of having a aspira drain put in if she continues to accumulate fluid fast.  Apt. Made for tomorrow 10/22/12 at 2pm at [facility] and is aware of her appt. With Vikki Ports on 10/27/12.  Wayne verbalized understanding.

## 2012-10-22 ENCOUNTER — Ambulatory Visit (HOSPITAL_COMMUNITY)
Admission: RE | Admit: 2012-10-22 | Discharge: 2012-10-22 | Disposition: A | Discharge status: Home / Self Care | Payer: Medicare Other | Source: Ambulatory Visit | Attending: Physician Assistant | Admitting: Physician Assistant

## 2012-10-22 ENCOUNTER — Ambulatory Visit (HOSPITAL_BASED_OUTPATIENT_CLINIC_OR_DEPARTMENT_OTHER): Payer: Medicare Other | Admitting: Oncology

## 2012-10-22 ENCOUNTER — Other Ambulatory Visit: Payer: Medicare Other | Admitting: Lab

## 2012-10-22 ENCOUNTER — Other Ambulatory Visit: Payer: Self-pay | Admitting: *Deleted

## 2012-10-22 ENCOUNTER — Ambulatory Visit: Payer: Medicare Other | Admitting: Oncology

## 2012-10-22 ENCOUNTER — Encounter: Payer: Medicare Other | Admitting: Physician Assistant

## 2012-10-22 ENCOUNTER — Encounter: Payer: Self-pay | Admitting: *Deleted

## 2012-10-22 VITALS — BP 114/72 | HR 103 | Temp 97.9°F | Resp 20 | Ht 64.0 in | Wt 139.2 lb

## 2012-10-22 DIAGNOSIS — C569 Malignant neoplasm of unspecified ovary: Secondary | ICD-10-CM

## 2012-10-22 DIAGNOSIS — Z853 Personal history of malignant neoplasm of breast: Secondary | ICD-10-CM

## 2012-10-22 DIAGNOSIS — R188 Other ascites: Secondary | ICD-10-CM

## 2012-10-22 DIAGNOSIS — R180 Malignant ascites: Secondary | ICD-10-CM | POA: Insufficient documentation

## 2012-10-22 DIAGNOSIS — F329 Major depressive disorder, single episode, unspecified: Secondary | ICD-10-CM

## 2012-10-22 HISTORY — DX: Other ascites: R18.8

## 2012-10-22 NOTE — Procedures (Signed)
Successful US guided paracentesis from [facility].  Yielded 2.5L of clear yellow fluid.  No immediate complications.  Pt tolerated well.   Specimen was not sent for labs.  Brayton El PA-C 10/22/2012 3:15 PM

## 2012-10-22 NOTE — Progress Notes (Signed)
This RN contacted IR to inquire if pt could obtain at paracentesis today an Aspira catheter for home use for comfort.  Informed above procedure would need to be scheduled in advance due to sedation protoccol.  Plan per discussion will be to proceed with theraputic paracentesis today for comfort and order placed for Aspira catheter placement for next week.  Discussed above with pt and husband.  Order entry per IR recommendation.

## 2012-10-23 ENCOUNTER — Encounter (HOSPITAL_COMMUNITY): Payer: Self-pay | Admitting: Pharmacy Technician

## 2012-10-25 ENCOUNTER — Other Ambulatory Visit: Payer: Self-pay | Admitting: Oncology

## 2012-10-25 NOTE — Progress Notes (Signed)
ID: [name] [name]   DOB: 11-11-1940  MR#: [id number]  CSN#:[id number]  PCP:  GYN: [name] Schimke, MD SU: [name] OTHER MD:  HISTORY OF PRESENT ILLNESS: [name] [name] is a 72 year-old [place] woman I followed remotely for a history of breast cancer. More recently she noted that her belly was getting "bigger". Gradually she has developed increasing low back pain. After a period of several months, as her symptoms increased, she brought this problem to her primary physician's attention and a KUB was obtained on 10/07/2011, which was unremarkable. This was followed by a CT scan 10/15/2011, which showed ascites and omental caking. Paracentesis 10/18/2011 showed ([id number]) malignant cells consistent with metastatic adenocarcinoma, with equivocal staining for estrogen receptor. The pattern of additional stains was most suggestive of a primary gynecologic tumor. CA 125 at this time was 2051.  The patient was referred to gynecologic oncology and on 10/29/2011 underwent exploratory laparotomy under Drs. [name] [name] and [name] [name]. This consisted of a total abdominal hysterectomy with bilateral salpingo-oophorectomy, omentectomy, and radical tumor debulking. Unfortunately there was significant tumor attached to the diaphragm so the patient was suboptimally debulked. Accordingly a peritoneal catheter was not placed. GYN-ONC recommended 6 cycles of carboplatin/paclitaxel given every 3 weeks, with Neulasta on day 2 for granulocyte support. [name] [name] subsequent history is as detailed below.  INTERVAL HISTORY:  [name] [name] returns today accompanied by her husband [name] [name]. This was an unscheduled visit. She had paracentesis last week, but the fluid has already reaccumulated, and she is extremely uncomfortable.  REVIEW OF SYSTEMS:  She denies fevers. She is very tired. She has pain localizing to the left lower quadrant of the abdomen. This is intermittent. She has mild seasonal allergy symptoms, mild ankle  swelling, and chronic low back pain made worse by the weight of the ascites. Her appetite is poor. She denies nausea or vomiting. She tolerated the first cycle of chemotherapy, Doxil, without event. She is depressed,  but the worst problem is the shortness of breath. A detailed review of systems was otherwise stable.  PAST MEDICAL HISTORY: Past Medical History  Diagnosis Date  . History of breast cancer   . COPD (chronic obstructive pulmonary disease)   . GERD (gastroesophageal reflux disease)   . Diabetes mellitus     type II  . Hx of colonic polyps   . Osteoarthritis   . MVP (mitral valve prolapse)   . Dysrhythmia     pt states runs a little fast   . Shortness of breath     with exertion   . Depression   . Anxiety   . Heart murmur   . Cancer     left breast  . Ovarian cancer     active chemotherapy  . Stroke   . Ascites 10/22/2012    PAST SURGICAL HISTORY: Past Surgical History  Procedure Laterality Date  . Cesarean section  1980, 1982    X 2   . Laparoscopy      work up for infertility  . Tympanoplasty      left X 2  . Left breast fibroadenoma removal  1960's  . Breast cyst aspirations    . Left modified radial mastectomy    . Laparotomy  10/29/2011    Procedure: EXPLORATORY LAPAROTOMY;  Surgeon: [name] Brock A. Duard Brady, MD;  Location: [facility];  Service: Gynecology;  Laterality: N/A;  . Abdominal hysterectomy  10/29/2011    Procedure: HYSTERECTOMY ABDOMINAL;  Surgeon: [name] Brock A. Duard Brady, MD;  Location: [facility];  Service: Gynecology;  Laterality: N/A;  Total abdominal hysterectomy  . Salpingoophorectomy  10/29/2011    Procedure: SALPINGO OOPHERECTOMY;  Surgeon: [name] Brock A. Duard Brady, MD;  Location: [facility];  Service: Gynecology;  Laterality: Bilateral;    FAMILY HISTORY Family History  Problem Relation Age of Onset  . Cancer Mother     endometrial/ colon  . Cancer Brother     colon  . COPD Other   . Hypertension Other   . Cancer Maternal Uncle     prostate/bladder/lung  . Cancer  Paternal Aunt     unknown abdominal cancer  . Cancer Maternal Grandmother     Salivary gland cancer  . Cancer Paternal Aunt     leukemia   the patient's father died at the age of 89. The patient's mother died at the age of 63; she had a history of colon cancer and endometrial cancer. The patient has no sisters. She has one brother, with a history of colon cancer. There is no history of breast cancer in the family. The patient is scheduled for genetic testing later this month.  GYNECOLOGIC HISTORY: She had menarche age 47. First pregnancy to term was at age 70. She is GX P2. She became menopausal at the time of her chemotherapy for breast cancer. This was age 42. 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 Her husband [name] [name] runs a Psychologist, sport and exercise out of their own home. Daughter [name] [name], 57,  currently lives at home with the patient. There are some issues relating to this daughter that the patient discussed briefly. [place]. [name] [name], 30, is an ED nurse at [employer]. The patient has no grandchildren. She is not a Advice worker.   ADVANCED DIRECTIVES: in place  HEALTH MAINTENANCE: History  Substance Use Topics  . Smoking status: Current Every Day Smoker -- 0.50 packs/day for 30 years    Types: Cigarettes  . Smokeless tobacco: Never Used     Comment: quit again 2009; restarted 1 ppd 2011  . Alcohol Use: No     Colonoscopy: 2009  PAP: s/p hysterectomy  Bone density: 2010. "good"  Mammography: 2012 NOV--unremarkable  No Known Allergies  Current Outpatient Prescriptions  Medication Sig Dispense Refill  . acetaminophen (TYLENOL) 325 MG tablet Take 650 mg by mouth every 6 (six) hours as needed. For pain      . albuterol-ipratropium (COMBIVENT) 18-103 MCG/ACT inhaler Inhale 2 puffs into the lungs every 4 (four) hours as needed. For wheezing  14.7 Inhaler  3  . atenolol (TENORMIN) 25 MG tablet Take 12.5 mg by mouth 2  (two) times daily.      Marland Kitchen b complex vitamins tablet Take 1 tablet by mouth daily.        . budesonide-formoterol (SYMBICORT) 160-4.5 MCG/ACT inhaler Inhale 2 puffs into the lungs 2 (two) times daily.      Marland Kitchen buPROPion (WELLBUTRIN SR) 150 MG 12 hr tablet Take 1 tablet (150 mg total) by mouth 2 (two) times daily.  180 tablet  3  . busPIRone (BUSPAR) 15 MG tablet Take 1 tablet (15 mg total) by mouth 2 (two) times daily.  180 tablet  2  . cholecalciferol (VITAMIN D) 1000 UNITS tablet Take 1,000 Units by mouth daily.        . clopidogrel (PLAVIX) 75 MG tablet Take 1 tablet (75 mg total) by mouth daily with breakfast.  60 tablet  3  . diazepam (VALIUM) 5 MG tablet Take 5-10 mg by mouth at  bedtime as needed for sleep.      . ferrous sulfate 325 (65 FE) MG tablet Take 325 mg by mouth daily with breakfast.      . ipratropium-albuterol (DUONEB) 0.5-2.5 (3) MG/3ML SOLN Take 3 mLs by nebulization every 4 (four) hours as needed (for shortness of breath).      . lidocaine-prilocaine (EMLA) cream Apply 1 application topically as needed. For treatments      . lovastatin (MEVACOR) 40 MG tablet Take 1 tablet (40 mg total) by mouth daily.  90 tablet  2  . metFORMIN (GLUCOPHAGE) 1000 MG tablet Take 1,000 mg by mouth 2 (two) times daily with a meal.      . Multiple Vitamin (MULTIVITAMIN WITH MINERALS) TABS Take 1 tablet by mouth daily.      Marland Kitchen omeprazole (PRILOSEC) 40 MG capsule Take 1 capsule (40 mg total) by mouth daily.  90 capsule  3  . ondansetron (ZOFRAN) 8 MG tablet Take 1 tablet (8 mg total) by mouth 2 (two) times daily. Take two times a day starting the day after chemo for 2 days. Then take two times a day as needed for nausea or vomiting.  30 tablet  1  . oxyCODONE-acetaminophen (PERCOCET/ROXICET) 5-325 MG per tablet Take 1 tablet by mouth every 4 (four) hours as needed for pain.      Marland Kitchen oxymetazoline (AFRIN) 0.05 % nasal spray Place 2 sprays into the nose 2 (two) times daily as needed. Allergies       .  prochlorperazine (COMPAZINE) 10 MG tablet Take 1 tablet (10 mg total) by mouth every 6 (six) hours as needed (Nausea or vomiting).  30 tablet  1  . senna (SENOKOT) 8.6 MG tablet Take 1 tablet by mouth daily as needed for constipation.      . sertraline (ZOLOFT) 100 MG tablet Take 1 tablet (100 mg total) by mouth daily before breakfast.  90 tablet  3  . [DISCONTINUED] busPIRone (BUSPAR) 15 MG tablet Take 1 tablet (15 mg total) by mouth 2 (two) times daily.  180 tablet  1  . [DISCONTINUED] roflumilast (DALIRESP) 500 MCG TABS tablet Take 500 mcg by mouth daily.         No current facility-administered medications for this visit.   OBJECTIVE: Older white woman who appears fatigued  There were no vitals filed for this visit.   There is no weight on file to calculate BMI.    ECOG FS: 2  Sclerae unicteric Oropharynx clear, slightly dry No cervical or supraclavicular adenopathy Lungs clear to auscultation, poor excursion bilateral Heart regular rate and rhythm Abdomen distended, nontender except minimally in the left lower quadrant, positive bowel sounds, no masses palpated MSK no focal spinal tenderness, no peripheral edema Neuro: nonfocal, well oriented, discouraged affect Breasts: Deferred.   LAB RESULTS: Lab Results  Component Value Date   WBC 10.9* 10/13/2012   NEUTROABS 8.0* 10/13/2012   HGB 11.3* 10/13/2012   HCT 34.1* 10/13/2012   MCV 91.0 10/13/2012   PLT 448* 10/13/2012      Chemistry      Component Value Date/Time   NA 140 10/06/2012 1149   NA 139 09/21/2012 1150   K 4.1 10/06/2012 1149   K 4.2 09/21/2012 1150   CL 103 10/06/2012 1149   CL 101 09/21/2012 1150   CO2 22 10/06/2012 1149   CO2 26 09/21/2012 1150   BUN 20 10/06/2012 1149   BUN 14.7 09/21/2012 1150   CREATININE 0.70 10/06/2012 1149   CREATININE  0.8 09/21/2012 1150      Component Value Date/Time   CALCIUM 9.1 10/06/2012 1149   CALCIUM 10.0 09/21/2012 1150   ALKPHOS 94 10/06/2012 1149   ALKPHOS 89 09/21/2012 1150   AST 18 10/06/2012  1149   AST 15 09/21/2012 1150   ALT 21 10/06/2012 1149   ALT 15 09/21/2012 1150   BILITOT 0.2* 10/06/2012 1149   BILITOT <0.20 Repeated and Verified 09/21/2012 1150    Results for HUXLEY, VANWAGONER (MRN [id number]) as of 10/01/2012 10:00  Ref. Range 05/05/2012 10:00 05/18/2012 10:29 06/17/2012 10:12 07/06/2012 12:37 09/21/2012 11:50  CA 125 Latest Range: 0.0-30.2 U/mL 38.8 (H) 37.5 (H) 33.3 (H) 35.7 (H) 879.3 (H)    STUDIES: Nm Hepato W/eject Fract  10/20/2012  *RADIOLOGY REPORT*  Clinical Data: Abdominal pain; no cholelithiasis  NUCLEAR MEDICINE HEPATOBILIARY WITH GB  Views:  Anterior right upper quadrant  Radiopharmaceutical:   Technetium 17m Choletec  Dose:  5.5 mCi  Route of administration: Intravenous  Findings: Liver uptake of radiotracer is normal.  There is prompt visualization of small bowel, consistent with patency of the common bile duct.  Gallbladder does not visualize during the course of this study, even after administration of 2.4 mg of morphine with an additional administration of 1.5 mCi of technetium 33m Choletec.  IMPRESSION: Nonvisualization of gallbladder despite administration of intravenous morphine.  This finding raises concern for cystic duct obstruction/acute cholecystitis .  Common bile duct is patent as is evidenced by prompt visualization of small bowel.   Original Report Authenticated By: Bretta Bang, M.D.    US Paracentesis  10/22/2012  *RADIOLOGY REPORT*  Clinical Data: Ovarian cancer with recurrent malignant ascites  ULTRASOUND GUIDED PARACENTESIS  Comparison:  Previous paracentesis  An ultrasound guided paracentesis was thoroughly discussed with the patient and questions answered.  The benefits, risks, alternatives and complications were also discussed.  The patient understands and wishes to proceed with the procedure.  Written consent was obtained.  Ultrasound was performed to localize and mark an adequate pocket of fluid in the left lateral quadrant of the abdomen.  The  area was then prepped and draped in the normal sterile fashion.  1% Lidocaine was used for local anesthesia.  Under ultrasound guidance a 19 gauge Yueh catheter was introduced.  Paracentesis was performed.  The catheter was removed and a dressing applied.  Complications:  None  Findings:  A total of approximately 2.5 liters of clear yellow fluid was removed.  A fluid sample was not sent for laboratory analysis.  IMPRESSION: Successful ultrasound guided paracentesis yielding 2.5 liters of ascites. Small-volume ascites left behind as residual, as the patient is scheduled for tunneled peritoneal drainage catheter next week.  Read by Brayton El PA-C   Original Report Authenticated By: Malachy Moan, M.D.    US Paracentesis  10/13/2012  *RADIOLOGY REPORT*  Clinical Data: History of ovarian cancer with recurrent ascites  ULTRASOUND GUIDED PARACENTESIS  Comparison:  Previous paracentesis  An ultrasound guided paracentesis was thoroughly discussed with the patient and questions answered.  The benefits, risks, alternatives and complications were also discussed.  The patient understands and wishes to proceed with the procedure.  Written consent was obtained.  Ultrasound was performed to localize and mark an adequate pocket of fluid in the left lateral quadrant of the abdomen.  The area was then prepped and draped in the normal sterile fashion.  1% Lidocaine was used for local anesthesia.  Under ultrasound guidance a 19 gauge Yueh catheter was introduced.  Paracentesis  was performed.  The catheter was removed and a dressing applied.  Complications:  None  Findings:  A total of approximately 2.7 liters of dark yellow fluid was removed.  A fluid sample was not sent for laboratory analysis.  IMPRESSION: Successful ultrasound guided paracentesis yielding 2.7 liters of ascites.  Read by Brayton El PA-C   Original Report Authenticated By: Judie Petit. Shick, M.D.    ASSESSMENT: 72 y.o.  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 [V323G (1196T>G)]    (1) status post left modified radical mastectomy October 1993 for a T2 N0 invasive ductal breast, estrogen and progesterone receptor positive cancer treated adjuvantly with MFL (methotrexate/fluorouracil/leucovorin) chemotherapy x6, completed April 1994, followed by 5 years of tamoxifen completed April 1999, with no evidence of recurrence  (2) now status post TAH BSO, omentectomy, and suboptimal debulking 10/29/2011 for a high-grade serous adenocarcinoma of the ovary, pT3c NX (Stage IIIC) , s/p eight cycles of carboplatin/paclitaxel completed 05/05/2012 (initial CA125 of 2050.7 normalized after cycle 5)  (3) recurrent disease documented by rising CEA 125 an abdominal CT scan 09/21/2012.  (4) COPD with ongoing tobacco abuse  (5) diabetes mellitus  (6) genetic testing for Lynch syndrome pending.  (7)  status post hospitalization in July 2013 for stroke/small acute infarcts in the left sub insular white matter and coronal radiata.  (8) port in place  (9) left upper lobe 6 mm subpleural nodule, without associated hypermetabolic activity (likely well below the resolution of PET imaging) noted August 2013  (10) CTs obtained in [place] 2014 showed evidence of significant progression of disease with widespread intraperitoneal metastases and a moderate volume of presumably malignant ascites.  (11) currently being treated with Doxil on a Q. three-week basis, first dose given on 10/06/2012  (12) cholecystitis, with cholecystectomy pending   PLAN:  She is currently day 17 of her first cycle of every three-week Doxil. She is tolerating the chemotherapy with no side effects so far. The problem is the rapid reaccumulation of the ascites. I think she will benefit not only from paracentesis today, but from having a permanent drainage catheter placed, which would allow her (with home health help) to do some drainage at home on an as-needed  basis. Otherwise the plan is to continue the Doxil, and reassess after 4 cycles. (She understands that with this chemotherapy the CA 125 may continue to rise initially before plateauing and then beginning to decrease).  Her husband [name] [name] was contacted by our chaplain. He would benefit from a group support program if we had one available for spouses. MAGRINAT,GUSTAV C    10/25/2012

## 2012-10-27 ENCOUNTER — Other Ambulatory Visit: Payer: Self-pay | Admitting: Radiology

## 2012-10-27 ENCOUNTER — Ambulatory Visit (HOSPITAL_BASED_OUTPATIENT_CLINIC_OR_DEPARTMENT_OTHER): Payer: Medicare Other

## 2012-10-27 ENCOUNTER — Other Ambulatory Visit (HOSPITAL_BASED_OUTPATIENT_CLINIC_OR_DEPARTMENT_OTHER): Payer: Medicare Other | Admitting: Lab

## 2012-10-27 ENCOUNTER — Ambulatory Visit (HOSPITAL_BASED_OUTPATIENT_CLINIC_OR_DEPARTMENT_OTHER): Payer: Medicare Other | Admitting: Physician Assistant

## 2012-10-27 ENCOUNTER — Encounter: Payer: Self-pay | Admitting: Physician Assistant

## 2012-10-27 VITALS — BP 111/72 | HR 101 | Temp 97.6°F | Resp 20 | Ht 64.0 in | Wt 136.5 lb

## 2012-10-27 DIAGNOSIS — C569 Malignant neoplasm of unspecified ovary: Secondary | ICD-10-CM

## 2012-10-27 DIAGNOSIS — Z853 Personal history of malignant neoplasm of breast: Secondary | ICD-10-CM

## 2012-10-27 DIAGNOSIS — C786 Secondary malignant neoplasm of retroperitoneum and peritoneum: Secondary | ICD-10-CM

## 2012-10-27 DIAGNOSIS — Z5111 Encounter for antineoplastic chemotherapy: Secondary | ICD-10-CM

## 2012-10-27 DIAGNOSIS — R140 Abdominal distension (gaseous): Secondary | ICD-10-CM

## 2012-10-27 DIAGNOSIS — K5900 Constipation, unspecified: Secondary | ICD-10-CM

## 2012-10-27 DIAGNOSIS — R180 Malignant ascites: Secondary | ICD-10-CM

## 2012-10-27 LAB — CBC WITH DIFFERENTIAL/PLATELET
Basophils Absolute: 0 10*3/uL (ref 0.0–0.1)
Eosinophils Absolute: 0.1 10*3/uL (ref 0.0–0.5)
HGB: 10.2 g/dL — ABNORMAL LOW (ref 11.6–15.9)
LYMPH%: 16.6 % (ref 14.0–49.7)
MCV: 89.6 fL (ref 79.5–101.0)
MONO#: 1.1 10*3/uL — ABNORMAL HIGH (ref 0.1–0.9)
MONO%: 10.3 % (ref 0.0–14.0)
NEUT#: 7.5 10*3/uL — ABNORMAL HIGH (ref 1.5–6.5)
Platelets: 642 10*3/uL — ABNORMAL HIGH (ref 145–400)
RBC: 3.46 10*6/uL — ABNORMAL LOW (ref 3.70–5.45)
RDW: 14.3 % (ref 11.2–14.5)
WBC: 10.5 10*3/uL — ABNORMAL HIGH (ref 3.9–10.3)
nRBC: 0 % (ref 0–0)

## 2012-10-27 LAB — COMPREHENSIVE METABOLIC PANEL (CC13)
ALT: 12 U/L (ref 0–55)
AST: 14 U/L (ref 5–34)
Albumin: 2.1 g/dL — ABNORMAL LOW (ref 3.5–5.0)
CO2: 25 mEq/L (ref 22–29)
Calcium: 8.6 mg/dL (ref 8.4–10.4)
Chloride: 102 mEq/L (ref 98–107)
Creatinine: 0.8 mg/dL (ref 0.6–1.1)
Potassium: 4.7 mEq/L (ref 3.5–5.1)

## 2012-10-27 MED ORDER — SODIUM CHLORIDE 0.9 % IJ SOLN
10.0000 mL | INTRAMUSCULAR | Status: DC | PRN
  Administered 2012-10-27: 10 mL
  Filled 2012-10-27: qty 10

## 2012-10-27 MED ORDER — DOXORUBICIN HCL LIPOSOMAL CHEMO INJECTION 2 MG/ML
40.0000 mg/m2 | Freq: Once | INTRAVENOUS | Status: AC
  Administered 2012-10-27: 66 mg via INTRAVENOUS
  Filled 2012-10-27: qty 33

## 2012-10-27 MED ORDER — HEPARIN SOD (PORK) LOCK FLUSH 100 UNIT/ML IV SOLN
500.0000 [IU] | Freq: Once | INTRAVENOUS | Status: AC | PRN
  Administered 2012-10-27: 500 [IU]
  Filled 2012-10-27: qty 5

## 2012-10-27 MED ORDER — ONDANSETRON 8 MG/50ML IVPB (CHCC)
8.0000 mg | Freq: Once | INTRAVENOUS | Status: AC
  Administered 2012-10-27: 8 mg via INTRAVENOUS

## 2012-10-27 MED ORDER — SODIUM CHLORIDE 0.9 % IV SOLN
Freq: Once | INTRAVENOUS | Status: AC
  Administered 2012-10-27: 12:00:00 via INTRAVENOUS

## 2012-10-27 MED ORDER — DEXAMETHASONE SODIUM PHOSPHATE 10 MG/ML IJ SOLN
10.0000 mg | Freq: Once | INTRAMUSCULAR | Status: AC
  Administered 2012-10-27: 10 mg via INTRAVENOUS

## 2012-10-27 NOTE — Patient Instructions (Signed)
[facility] Discharge Instructions for Patients Receiving Chemotherapy  Today you received the following chemotherapy agents: adriamycin   To help prevent nausea and vomiting after your treatment, we encourage you to take your nausea medication.  Take it as often as prescribed.    If you develop nausea and vomiting that is not controlled by your nausea medication, call the clinic. If it is after clinic hours your family physician or the after hours number for the clinic or go to the Emergency Department.   BELOW ARE SYMPTOMS THAT SHOULD BE REPORTED IMMEDIATELY:  *FEVER GREATER THAN 100.5 F  *CHILLS WITH OR WITHOUT FEVER  NAUSEA AND VOMITING THAT IS NOT CONTROLLED WITH YOUR NAUSEA MEDICATION  *UNUSUAL SHORTNESS OF BREATH  *UNUSUAL BRUISING OR BLEEDING  TENDERNESS IN MOUTH AND THROAT WITH OR WITHOUT PRESENCE OF ULCERS  *URINARY PROBLEMS  *BOWEL PROBLEMS  UNUSUAL RASH Items with * indicate a potential emergency and should be followed up as soon as possible.  Feel free to call the clinic you have any questions or concerns. The clinic phone number is [phone].   I have been informed and understand all the instructions given to me. I know to contact the clinic, my physician, or go to the Emergency Department if any problems should occur. I do not have any questions at this time, but understand that I may call the clinic during office hours   should I have any questions or need assistance in obtaining follow up care.    __________________________________________  _____________  __________ Signature of Patient or Authorized Representative            Date                   Time    __________________________________________ Nurse's Signature

## 2012-10-27 NOTE — Progress Notes (Signed)
ID: [name] [name]   DOB: 07-03-1941  MR#: [id number]  CSN#:[id number]  PCP:  GYN: [name] [name] SU: [name] OTHER [name]:  HISTORY OF PRESENT ILLNESS: [name] [name] is a 72 year-old [place] woman I followed remotely for a history of breast cancer. More recently she noted that her belly was getting "bigger". Gradually she has developed increasing low back pain. After a period of several months, as her symptoms increased, she brought this problem to her primary physician's attention and a KUB was obtained on 10/07/2011, which was unremarkable. This was followed by a CT scan 10/15/2011, which showed ascites and omental caking. Paracentesis 10/18/2011 showed ([name] [name]) malignant cells consistent with metastatic adenocarcinoma, with equivocal staining for estrogen receptor. The pattern of additional stains was most suggestive of a primary gynecologic tumor. CA 125 at this time was 2051.  The patient was referred to gynecologic oncology and on 10/29/2011 underwent exploratory laparotomy under Drs. [name] [name]. This consisted of a total abdominal hysterectomy with bilateral salpingo-oophorectomy, omentectomy, and radical tumor debulking. Unfortunately there was significant tumor attached to the diaphragm so the patient was suboptimally debulked. Accordingly a peritoneal catheter was not placed. GYN-ONC recommended 6 cycles of carboplatin/paclitaxel given every 3 weeks, with Neulasta on day 2 for granulocyte support. [name] [name]'s subsequent history is as detailed below.  INTERVAL HISTORY:  [name] [name] returns today accompanied by her husband [name] [name]. She is due for day 1 cycle 2 of Doxil given every 3 weeks.   Interval history is remarkable for her reaccumulating ascites requiring therapeutic paracentesis, most recently last week on 10/22/2012 with removal of 2.5 L of fluid. She reaccumulate fluid very quickly, and becomes very uncomfortable. They have discussed placing an Aspira drain  and this has been scheduled.    REVIEW OF SYSTEMS:   [name] [name] has had no fevers or chills. She does have frequent hot flashes which have worsened. She notes no skin changes, rashes, or evidence of  PPE.  She's had no mouth ulcers or oral sensitivity. Her appetite is good when she first has her paracentesis, then slowly declines with early satiety as the ascites reaccumulates. Fortunately, she denies any nausea, and is having regular bowel movements. No change in urinary habits. She has no significant cough, no phlegm production, but continues to have shortness of breath with even mild exertion. She's had no chest pain or palpitations. She denies abnormal headaches or dizziness. She has some occasional pain in the left lower quadrant of the abdomen which comes and goes, but denies any additional pain, including the myalgias, arthralgias, or bony pain. Her feet and ankles swell at night, but this improves with elevation.  A detailed view of systems is otherwise stable and noncontributory.  PAST MEDICAL HISTORY: Past Medical History  Diagnosis Date  . History of breast cancer   . COPD (chronic obstructive pulmonary disease)   . GERD (gastroesophageal reflux disease)   . Diabetes mellitus     type II  . Hx of colonic polyps   . Osteoarthritis   . MVP (mitral valve prolapse)   . Dysrhythmia     pt states runs a little fast   . Shortness of breath     with exertion   . Depression   . Anxiety   . Heart murmur   . Cancer     left breast  . Ovarian cancer     active chemotherapy  . Stroke   . Ascites 10/22/2012    PAST SURGICAL HISTORY: Past Surgical History  Procedure Laterality Date  . Cesarean section  1980, 1982    X 2   . Laparoscopy      work up for infertility  . Tympanoplasty      left X 2  . Left breast fibroadenoma removal  1960's  . Breast cyst aspirations    . Left modified radial mastectomy    . Laparotomy  10/29/2011    Procedure: EXPLORATORY LAPAROTOMY;  Surgeon:  [name] Brock A. Duard Brady, [name];  Location: [facility];  Service: Gynecology;  Laterality: N/A;  . Abdominal hysterectomy  10/29/2011    Procedure: HYSTERECTOMY ABDOMINAL;  Surgeon: [name] Brock A. Duard Brady, [name];  Location: [facility];  Service: Gynecology;  Laterality: N/A;  Total abdominal hysterectomy  . Salpingoophorectomy  10/29/2011    Procedure: SALPINGO OOPHERECTOMY;  Surgeon: [name] Brock A. Duard Brady, [name];  Location: [facility];  Service: Gynecology;  Laterality: Bilateral;    FAMILY HISTORY Family History  Problem Relation Age of Onset  . Cancer Mother     endometrial/ colon  . Cancer Brother     colon  . COPD Other   . Hypertension Other   . Cancer Maternal Uncle     prostate/bladder/lung  . Cancer Paternal Aunt     unknown abdominal cancer  . Cancer Maternal Grandmother     Salivary gland cancer  . Cancer Paternal Aunt     leukemia   the patient's father died at the age of 52. The patient's mother died at the age of 70; she had a history of colon cancer and endometrial cancer. The patient has no sisters. She has one brother, with a history of colon cancer. There is no history of breast cancer in the family. The patient is scheduled for genetic testing later this month.  GYNECOLOGIC HISTORY: She had menarche age 72. First pregnancy to term was at age 35. She is GX P2. She became menopausal at the time of her chemotherapy for breast cancer. This was age 70. 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 Her husband [name] [name] runs a Psychologist, sport and exercise out of their own home. Daughter [name] [name], 73,  currently lives at home with the patient. There are some issues relating to this daughter that the patient discussed briefly. [name] [name]. [name] [name], 30, is an ED nurse at [employer]. The patient has no grandchildren. She is not a Advice worker.   ADVANCED DIRECTIVES: in place  HEALTH MAINTENANCE: History  Substance Use Topics  . Smoking status: Current Every Day Smoker  -- 0.50 packs/day for 30 years    Types: Cigarettes  . Smokeless tobacco: Never Used     Comment: quit again 2009; restarted 1 ppd 2011  . Alcohol Use: No     Colonoscopy: 2009  PAP: s/p hysterectomy  Bone density: 2010. "good"  Mammography: 2012 NOV--unremarkable  No Known Allergies  Current Outpatient Prescriptions  Medication Sig Dispense Refill  . acetaminophen (TYLENOL) 325 MG tablet Take 650 mg by mouth every 6 (six) hours as needed. For pain      . albuterol-ipratropium (COMBIVENT) 18-103 MCG/ACT inhaler Inhale 2 puffs into the lungs every 4 (four) hours as needed. For wheezing  14.7 Inhaler  3  . atenolol (TENORMIN) 25 MG tablet Take 12.5 mg by mouth 2 (two) times daily.      Marland Kitchen b complex vitamins tablet Take 1 tablet by mouth daily.        . budesonide-formoterol (SYMBICORT) 160-4.5 MCG/ACT inhaler Inhale 2 puffs into the lungs  2 (two) times daily.      Marland Kitchen buPROPion (WELLBUTRIN SR) 150 MG 12 hr tablet Take 1 tablet (150 mg total) by mouth 2 (two) times daily.  180 tablet  3  . busPIRone (BUSPAR) 15 MG tablet Take 1 tablet (15 mg total) by mouth 2 (two) times daily.  180 tablet  2  . cholecalciferol (VITAMIN D) 1000 UNITS tablet Take 1,000 Units by mouth daily.        . clopidogrel (PLAVIX) 75 MG tablet Take 1 tablet (75 mg total) by mouth daily with breakfast.  60 tablet  3  . diazepam (VALIUM) 5 MG tablet Take 5-10 mg by mouth at bedtime as needed for sleep.      . ferrous sulfate 325 (65 FE) MG tablet Take 325 mg by mouth daily with breakfast.      . ipratropium-albuterol (DUONEB) 0.5-2.5 (3) MG/3ML SOLN Take 3 mLs by nebulization every 4 (four) hours as needed (for shortness of breath).      . lidocaine-prilocaine (EMLA) cream Apply 1 application topically as needed. For treatments      . lovastatin (MEVACOR) 40 MG tablet Take 1 tablet (40 mg total) by mouth daily.  90 tablet  2  . metFORMIN (GLUCOPHAGE) 1000 MG tablet Take 1,000 mg by mouth 2 (two) times daily with a meal.       . Multiple Vitamin (MULTIVITAMIN WITH MINERALS) TABS Take 1 tablet by mouth daily.      Marland Kitchen omeprazole (PRILOSEC) 40 MG capsule Take 1 capsule (40 mg total) by mouth daily.  90 capsule  3  . ondansetron (ZOFRAN) 8 MG tablet Take 1 tablet (8 mg total) by mouth 2 (two) times daily. Take two times a day starting the day after chemo for 2 days. Then take two times a day as needed for nausea or vomiting.  30 tablet  1  . oxyCODONE-acetaminophen (PERCOCET/ROXICET) 5-325 MG per tablet Take 1 tablet by mouth every 4 (four) hours as needed for pain.      Marland Kitchen oxymetazoline (AFRIN) 0.05 % nasal spray Place 2 sprays into the nose 2 (two) times daily as needed. Allergies       . prochlorperazine (COMPAZINE) 10 MG tablet Take 1 tablet (10 mg total) by mouth every 6 (six) hours as needed (Nausea or vomiting).  30 tablet  1  . senna (SENOKOT) 8.6 MG tablet Take 1 tablet by mouth daily as needed for constipation.      . sertraline (ZOLOFT) 100 MG tablet Take 1 tablet (100 mg total) by mouth daily before breakfast.  90 tablet  3  . [DISCONTINUED] busPIRone (BUSPAR) 15 MG tablet Take 1 tablet (15 mg total) by mouth 2 (two) times daily.  180 tablet  1  . [DISCONTINUED] roflumilast (DALIRESP) 500 MCG TABS tablet Take 500 mcg by mouth daily.         No current facility-administered medications for this visit.   OBJECTIVE: Older white woman who appears fatigued   Filed Vitals:   10/27/12 1109  BP: 111/72  Pulse: 101  Temp: 97.6 F (36.4 C)  Resp: 20     Body mass index is 23.42 kg/(m^2).    ECOG FS: 2 Filed Weights   10/27/12 1109  Weight: 136 lb 8 oz (61.916 kg)   Sclerae unicteric Oropharynx clear, slightly dry, no ulcerations No cervical or supraclavicular adenopathy Lungs diminished breath sounds bilaterally in the bases, with scattered rhonchi. Heart regular rate and rhythm Abdomen distended, nontender, positive bowel sounds,  no masses palpated MSK no focal spinal tenderness, no peripheral  edema Neuro: nonfocal, well oriented, discouraged affect Breasts: Deferred.   LAB RESULTS: Lab Results  Component Value Date   WBC 10.5* 10/27/2012   NEUTROABS 7.5* 10/27/2012   HGB 10.2* 10/27/2012   HCT 31.0* 10/27/2012   MCV 89.6 10/27/2012   PLT 642* 10/27/2012      Chemistry      Component Value Date/Time   NA 140 10/06/2012 1149   NA 139 09/21/2012 1150   K 4.1 10/06/2012 1149   K 4.2 09/21/2012 1150   CL 103 10/06/2012 1149   CL 101 09/21/2012 1150   CO2 22 10/06/2012 1149   CO2 26 09/21/2012 1150   BUN 20 10/06/2012 1149   BUN 14.7 09/21/2012 1150   CREATININE 0.70 10/06/2012 1149   CREATININE 0.8 09/21/2012 1150      Component Value Date/Time   CALCIUM 9.1 10/06/2012 1149   CALCIUM 10.0 09/21/2012 1150   ALKPHOS 94 10/06/2012 1149   ALKPHOS 89 09/21/2012 1150   AST 18 10/06/2012 1149   AST 15 09/21/2012 1150   ALT 21 10/06/2012 1149   ALT 15 09/21/2012 1150   BILITOT 0.2* 10/06/2012 1149   BILITOT <0.20 Repeated and Verified 09/21/2012 1150    Results for [name] [name], [name] [name] (MRN [id number]) as of 10/01/2012 10:00  Ref. Range 05/05/2012 10:00 05/18/2012 10:29 06/17/2012 10:12 07/06/2012 12:37 09/21/2012 11:50  CA 125 Latest Range: 0.0-30.2 U/mL 38.8 (H) 37.5 (H) 33.3 (H) 35.7 (H) 879.3 (H)    STUDIES: Nm Hepato W/eject Fract  10/20/2012  *RADIOLOGY REPORT*  Clinical Data: Abdominal pain; no cholelithiasis  NUCLEAR MEDICINE HEPATOBILIARY WITH GB  Views:  Anterior right upper quadrant  Radiopharmaceutical:   Technetium 41m Choletec  Dose:  5.5 mCi  Route of administration: Intravenous  Findings: Liver uptake of radiotracer is normal.  There is prompt visualization of small bowel, consistent with patency of the common bile duct.  Gallbladder does not visualize during the course of this study, even after administration of 2.4 mg of morphine with an additional administration of 1.5 mCi of technetium 31m Choletec.  IMPRESSION: Nonvisualization of gallbladder despite administration of intravenous morphine.   This finding raises concern for cystic duct obstruction/acute cholecystitis .  Common bile duct is patent as is evidenced by prompt visualization of small bowel.   Original Report Authenticated By: Bretta Bang, M.D.    US Paracentesis  10/22/2012  *RADIOLOGY REPORT*  Clinical Data: Ovarian cancer with recurrent malignant ascites  ULTRASOUND GUIDED PARACENTESIS  Comparison:  Previous paracentesis  An ultrasound guided paracentesis was thoroughly discussed with the patient and questions answered.  The benefits, risks, alternatives and complications were also discussed.  The patient understands and wishes to proceed with the procedure.  Written consent was obtained.  Ultrasound was performed to localize and mark an adequate pocket of fluid in the left lateral quadrant of the abdomen.  The area was then prepped and draped in the normal sterile fashion.  1% Lidocaine was used for local anesthesia.  Under ultrasound guidance a 19 gauge Yueh catheter was introduced.  Paracentesis was performed.  The catheter was removed and a dressing applied.  Complications:  None  Findings:  A total of approximately 2.5 liters of clear yellow fluid was removed.  A fluid sample was not sent for laboratory analysis.  IMPRESSION: Successful ultrasound guided paracentesis yielding 2.5 liters of ascites. Small-volume ascites left behind as residual, as the patient is scheduled for tunneled peritoneal drainage  catheter next week.  Read by Brayton El PA-C   Original Report Authenticated By: Malachy Moan, M.D.     ASSESSMENT: 72 y.o.  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 [V323G (1196T>G)]    (1) status post left modified radical mastectomy October 1993 for a T2 N0 invasive ductal breast, estrogen and progesterone receptor positive cancer treated adjuvantly with MFL (methotrexate/fluorouracil/leucovorin) chemotherapy x6, completed April 1994, followed by 5 years of tamoxifen  completed April 1999, with no evidence of recurrence  (2) now status post TAH BSO, omentectomy, and suboptimal debulking 10/29/2011 for a high-grade serous adenocarcinoma of the ovary, pT3c NX (Stage IIIC) , s/p eight cycles of carboplatin/paclitaxel completed 05/05/2012 (initial CA125 of 2050.7 normalized after cycle 5)  (3) recurrent disease documented by rising CEA 125 an abdominal CT scan 09/21/2012.  (4) COPD with ongoing tobacco abuse  (5) diabetes mellitus  (6) genetic testing for Lynch syndrome pending.  (7)  status post hospitalization in July 2013 for stroke/small acute infarcts in the left sub insular white matter and coronal radiata.  (8) port in place  (9) left upper lobe 6 mm subpleural nodule, without associated hypermetabolic activity (likely well below the resolution of PET imaging) noted August 2013  (10) CTs obtained in [place] 2014 showed evidence of significant progression of disease with widespread intraperitoneal metastases and a moderate volume of presumably malignant ascites.  (11) currently being treated with Doxil on a Q. three-week basis, first dose given on 10/06/2012  (12) cholecystitis, with cholecystectomy pending  (13)  recurring ascites, requiring therapeutic paracentesis, and pending placement of Aspira drain   PLAN:  This case was reviewed with Dr. Darnelle Catalan. [name] [name] will proceed to treatment today as scheduled for her second every 3 week dose of Doxil. She will have her Aspira placed by interventional radiology as scheduled on March 27, and we will get her set up with home care to come out hopefully on Friday evening to complete teaching with regards to the drain.  I plan is to continue the Doxil every 3 weeks, then restage after 4 cycles. She scheduled to see Dr. Darnelle Catalan next week for followup on April 2, and I will see her again in 3 weeks, April 15, prior to cycle 3. [name] [name] and [name] [name] both voice understanding and agreement with this plan, and will  call with any changes or problems.  Of note, I will mention that [name] [name] is scheduled to attend a caregivers support group tomorrow night which I think you'll find very helpful.   Keean Wilmeth    10/27/2012

## 2012-10-28 ENCOUNTER — Encounter: Payer: Self-pay | Admitting: Oncology

## 2012-10-28 ENCOUNTER — Encounter: Payer: Self-pay | Admitting: *Deleted

## 2012-10-28 LAB — CA 125: CA 125: 599.7 U/mL — ABNORMAL HIGH (ref 0.0–30.2)

## 2012-10-28 NOTE — Progress Notes (Signed)
Faxed orders to [facility] for aspira drain care to [phone].

## 2012-10-29 ENCOUNTER — Encounter (HOSPITAL_COMMUNITY): Payer: Self-pay

## 2012-10-29 ENCOUNTER — Ambulatory Visit (HOSPITAL_COMMUNITY)
Admission: RE | Admit: 2012-10-29 | Discharge: 2012-10-29 | Disposition: A | Discharge status: Home / Self Care | Payer: Medicare Other | Source: Ambulatory Visit | Attending: Oncology | Admitting: Oncology

## 2012-10-29 DIAGNOSIS — Z8673 Personal history of transient ischemic attack (TIA), and cerebral infarction without residual deficits: Secondary | ICD-10-CM | POA: Insufficient documentation

## 2012-10-29 DIAGNOSIS — J4489 Other specified chronic obstructive pulmonary disease: Secondary | ICD-10-CM | POA: Insufficient documentation

## 2012-10-29 DIAGNOSIS — F17200 Nicotine dependence, unspecified, uncomplicated: Secondary | ICD-10-CM | POA: Insufficient documentation

## 2012-10-29 DIAGNOSIS — R188 Other ascites: Secondary | ICD-10-CM

## 2012-10-29 DIAGNOSIS — K219 Gastro-esophageal reflux disease without esophagitis: Secondary | ICD-10-CM | POA: Insufficient documentation

## 2012-10-29 DIAGNOSIS — J449 Chronic obstructive pulmonary disease, unspecified: Secondary | ICD-10-CM | POA: Insufficient documentation

## 2012-10-29 DIAGNOSIS — Z853 Personal history of malignant neoplasm of breast: Secondary | ICD-10-CM | POA: Insufficient documentation

## 2012-10-29 DIAGNOSIS — C569 Malignant neoplasm of unspecified ovary: Secondary | ICD-10-CM | POA: Insufficient documentation

## 2012-10-29 DIAGNOSIS — Z79899 Other long term (current) drug therapy: Secondary | ICD-10-CM | POA: Insufficient documentation

## 2012-10-29 DIAGNOSIS — R180 Malignant ascites: Secondary | ICD-10-CM | POA: Insufficient documentation

## 2012-10-29 DIAGNOSIS — Z8049 Family history of malignant neoplasm of other genital organs: Secondary | ICD-10-CM | POA: Insufficient documentation

## 2012-10-29 DIAGNOSIS — E119 Type 2 diabetes mellitus without complications: Secondary | ICD-10-CM | POA: Insufficient documentation

## 2012-10-29 LAB — APTT: aPTT: 48 seconds — ABNORMAL HIGH (ref 24–37)

## 2012-10-29 LAB — BASIC METABOLIC PANEL
CO2: 25 mEq/L (ref 19–32)
Calcium: 8.7 mg/dL (ref 8.4–10.5)
Potassium: 4.5 mEq/L (ref 3.5–5.1)
Sodium: 135 mEq/L (ref 135–145)

## 2012-10-29 LAB — CBC
Hemoglobin: 9.4 g/dL — ABNORMAL LOW (ref 12.0–15.0)
MCV: 89.4 fL (ref 78.0–100.0)
Platelets: 770 10*3/uL — ABNORMAL HIGH (ref 150–400)
RBC: 3.12 MIL/uL — ABNORMAL LOW (ref 3.87–5.11)
WBC: 11.1 10*3/uL — ABNORMAL HIGH (ref 4.0–10.5)

## 2012-10-29 LAB — PROTIME-INR: Prothrombin Time: 13.4 seconds (ref 11.6–15.2)

## 2012-10-29 MED ORDER — MIDAZOLAM HCL 2 MG/2ML IJ SOLN
INTRAMUSCULAR | Status: AC
  Filled 2012-10-29: qty 6

## 2012-10-29 MED ORDER — HEPARIN SOD (PORK) LOCK FLUSH 100 UNIT/ML IV SOLN
INTRAVENOUS | Status: AC
  Administered 2012-10-29: 17:00:00
  Filled 2012-10-29: qty 5

## 2012-10-29 MED ORDER — CEFAZOLIN SODIUM 1-5 GM-% IV SOLN
1.0000 g | INTRAVENOUS | Status: AC
  Administered 2012-10-29: 1 g via INTRAVENOUS
  Filled 2012-10-29: qty 50

## 2012-10-29 MED ORDER — FENTANYL CITRATE 0.05 MG/ML IJ SOLN
INTRAMUSCULAR | Status: AC
  Filled 2012-10-29: qty 6

## 2012-10-29 MED ORDER — MIDAZOLAM HCL 2 MG/2ML IJ SOLN
INTRAMUSCULAR | Status: AC | PRN
  Administered 2012-10-29 (×5): 0.5 mg via INTRAVENOUS

## 2012-10-29 MED ORDER — LIDOCAINE HCL 1 % IJ SOLN
INTRAMUSCULAR | Status: AC
  Filled 2012-10-29: qty 20

## 2012-10-29 MED ORDER — SODIUM CHLORIDE 0.9 % IV SOLN
INTRAVENOUS | Status: DC
  Administered 2012-10-29: 12:00:00 via INTRAVENOUS

## 2012-10-29 MED ORDER — FENTANYL CITRATE 0.05 MG/ML IJ SOLN
INTRAMUSCULAR | Status: AC | PRN
  Administered 2012-10-29: 50 ug via INTRAVENOUS
  Administered 2012-10-29 (×3): 25 ug via INTRAVENOUS

## 2012-10-29 NOTE — H&P (Signed)
[name] [name] is an 72 y.o. female.   Chief Complaint: "I'm having a catheter put in my belly" HPI: Patient with history of breast/ovarian carcinoma and recurrent malignant ascites presents today for placement of a tunneled peritoneal catheter.  Past Medical History  Diagnosis Date  . History of breast cancer   . COPD (chronic obstructive pulmonary disease)   . GERD (gastroesophageal reflux disease)   . Diabetes mellitus     type II  . Hx of colonic polyps   . Osteoarthritis   . MVP (mitral valve prolapse)   . Dysrhythmia     pt states runs a little fast   . Shortness of breath     with exertion   . Depression   . Anxiety   . Heart murmur   . Cancer     left breast  . Ovarian cancer     active chemotherapy  . Stroke   . Ascites 10/22/2012    Past Surgical History  Procedure Laterality Date  . Cesarean section  1980, 1982    X 2   . Laparoscopy      work up for infertility  . Tympanoplasty      left X 2  . Left breast fibroadenoma removal  1960's  . Breast cyst aspirations    . Left modified radial mastectomy    . Laparotomy  10/29/2011    Procedure: EXPLORATORY LAPAROTOMY;  Surgeon: Rejeana Brock A. Duard Brady, MD;  Location: [facility];  Service: Gynecology;  Laterality: N/A;  . Abdominal hysterectomy  10/29/2011    Procedure: HYSTERECTOMY ABDOMINAL;  Surgeon: Rejeana Brock A. Duard Brady, MD;  Location: [facility];  Service: Gynecology;  Laterality: N/A;  Total abdominal hysterectomy  . Salpingoophorectomy  10/29/2011    Procedure: SALPINGO OOPHERECTOMY;  Surgeon: Rejeana Brock A. Duard Brady, MD;  Location: [facility];  Service: Gynecology;  Laterality: Bilateral;    Family History  Problem Relation Age of Onset  . Cancer Mother     endometrial/ colon  . Cancer Brother     colon  . COPD Other   . Hypertension Other   . Cancer Maternal Uncle     prostate/bladder/lung  . Cancer Paternal Aunt     unknown abdominal cancer  . Cancer Maternal Grandmother     Salivary gland cancer  . Cancer Paternal Aunt      leukemia   Social History:  reports that she has been smoking Cigarettes.  She has a 15 pack-year smoking history. She has never used smokeless tobacco. She reports that she does not drink alcohol or use illicit drugs.  Allergies: No Known Allergies  Current outpatient prescriptions:acetaminophen (TYLENOL) 325 MG tablet, Take 650 mg by mouth every 6 (six) hours as needed. For pain, Disp: , Rfl: ;  albuterol-ipratropium (COMBIVENT) 18-103 MCG/ACT inhaler, Inhale 2 puffs into the lungs every 4 (four) hours as needed. For wheezing, Disp: 14.7 Inhaler, Rfl: 3;  atenolol (TENORMIN) 25 MG tablet, Take 12.5 mg by mouth 2 (two) times daily., Disp: , Rfl:  b complex vitamins tablet, Take 1 tablet by mouth daily.  , Disp: , Rfl: ;  budesonide-formoterol (SYMBICORT) 160-4.5 MCG/ACT inhaler, Inhale 2 puffs into the lungs 2 (two) times daily., Disp: , Rfl: ;  buPROPion (WELLBUTRIN SR) 150 MG 12 hr tablet, Take 1 tablet (150 mg total) by mouth 2 (two) times daily., Disp: 180 tablet, Rfl: 3;  busPIRone (BUSPAR) 15 MG tablet, Take 1 tablet (15 mg total) by mouth 2 (two) times daily., Disp: 180 tablet, Rfl: 2  cholecalciferol (VITAMIN D) 1000 UNITS tablet, Take 1,000 Units by mouth daily.  , Disp: , Rfl: ;  diazepam (VALIUM) 5 MG tablet, Take 5-10 mg by mouth at bedtime as needed for sleep., Disp: , Rfl: ;  ferrous sulfate 325 (65 FE) MG tablet, Take 325 mg by mouth daily with breakfast., Disp: , Rfl:  ipratropium-albuterol (DUONEB) 0.5-2.5 (3) MG/3ML SOLN, Take 3 mLs by nebulization every 4 (four) hours as needed (for shortness of breath)., Disp: , Rfl: ;  lidocaine-prilocaine (EMLA) cream, Apply 1 application topically as needed. For treatments, Disp: , Rfl: ;  lovastatin (MEVACOR) 40 MG tablet, Take 1 tablet (40 mg total) by mouth daily., Disp: 90 tablet, Rfl: 2 metFORMIN (GLUCOPHAGE) 1000 MG tablet, Take 1,000 mg by mouth 2 (two) times daily with a meal., Disp: , Rfl: ;  Multiple Vitamin (MULTIVITAMIN WITH MINERALS)  TABS, Take 1 tablet by mouth daily., Disp: , Rfl: ;  omeprazole (PRILOSEC) 40 MG capsule, Take 1 capsule (40 mg total) by mouth daily., Disp: 90 capsule, Rfl: 3 ondansetron (ZOFRAN) 8 MG tablet, Take 1 tablet (8 mg total) by mouth 2 (two) times daily. Take two times a day starting the day after chemo for 2 days. Then take two times a day as needed for nausea or vomiting., Disp: 30 tablet, Rfl: 1;  oxyCODONE-acetaminophen (PERCOCET/ROXICET) 5-325 MG per tablet, Take 1 tablet by mouth every 4 (four) hours as needed for pain., Disp: , Rfl:  prochlorperazine (COMPAZINE) 10 MG tablet, Take 1 tablet (10 mg total) by mouth every 6 (six) hours as needed (Nausea or vomiting)., Disp: 30 tablet, Rfl: 1;  senna (SENOKOT) 8.6 MG tablet, Take 1 tablet by mouth daily as needed for constipation., Disp: , Rfl: ;  sertraline (ZOLOFT) 100 MG tablet, Take 1 tablet (100 mg total) by mouth daily before breakfast., Disp: 90 tablet, Rfl: 3 clopidogrel (PLAVIX) 75 MG tablet, Take 1 tablet (75 mg total) by mouth daily with breakfast., Disp: 60 tablet, Rfl: 3;  oxymetazoline (AFRIN) 0.05 % nasal spray, Place 2 sprays into the nose 2 (two) times daily as needed. Allergies , Disp: , Rfl: ;  [DISCONTINUED] busPIRone (BUSPAR) 15 MG tablet, Take 1 tablet (15 mg total) by mouth 2 (two) times daily., Disp: 180 tablet, Rfl: 1 [DISCONTINUED] roflumilast (DALIRESP) 500 MCG TABS tablet, Take 500 mcg by mouth daily.  , Disp: , Rfl:  Current facility-administered medications:0.9 %  sodium chloride infusion, , Intravenous, Continuous, D Kevin Allred, PA-C, Last Rate: 20 mL/hr at 10/29/12 1220;  ceFAZolin (ANCEF) IVPB 1 g/50 mL premix, 1 g, Intravenous, On Call, D Jeananne Rama, PA-C   Results for orders placed in visit on 10/27/12  CBC WITH DIFFERENTIAL      Result Value Range   WBC 10.5 (*) 3.9 - 10.3 10e3/uL   NEUT# 7.5 (*) 1.5 - 6.5 10e3/uL   HGB 10.2 (*) 11.6 - 15.9 g/dL   HCT 14.7 (*) 82.9 - 56.2 %   Platelets 642 (*) 145 - 400 10e3/uL    MCV 89.6  79.5 - 101.0 fL   MCH 29.5  25.1 - 34.0 pg   MCHC 32.9  31.5 - 36.0 g/dL   RBC 1.30 (*) 8.65 - 7.84 10e6/uL   RDW 14.3  11.2 - 14.5 %   lymph# 1.8  0.9 - 3.3 10e3/uL   MONO# 1.1 (*) 0.1 - 0.9 10e3/uL   Eosinophils Absolute 0.1  0.0 - 0.5 10e3/uL   Basophils Absolute 0.0  0.0 - 0.1 10e3/uL  NEUT% 71.6  38.4 - 76.8 %   LYMPH% 16.6  14.0 - 49.7 %   MONO% 10.3  0.0 - 14.0 %   EOS% 1.1  0.0 - 7.0 %   BASO% 0.4  0.0 - 2.0 %   nRBC 0  0 - 0 %  CA 125      Result Value Range   CA 125 599.7 (*) 0.0 - 30.2 U/mL  10/29/12 labs pending  Review of Systems  Constitutional: Negative for fever and chills.  Respiratory: Positive for cough and shortness of breath. Negative for hemoptysis.   Cardiovascular: Positive for leg swelling. Negative for chest pain.  Gastrointestinal: Positive for abdominal pain. Negative for nausea and vomiting.  Musculoskeletal: Positive for back pain.  Neurological: Negative for headaches.    Blood pressure 118/67, pulse 102, temperature 97.8 F (36.6 C), resp. rate 20, height 5\' 4"  (1.626 m), SpO2 94.00%. Physical Exam  Constitutional: She is oriented to person, place, and time. She appears well-developed and well-nourished.  Cardiovascular: Normal rate and regular rhythm.   Respiratory: Effort normal.  Few scatt exp wheezes, dim BS bases; intact /clean rt chest PAC  GI: Soft. Bowel sounds are normal. She exhibits distension. There is tenderness.  Musculoskeletal: Normal range of motion.  Trace- 1 + LLE edema  Neurological: She is alert and oriented to person, place, and time.     Assessment/Plan Pt with hx breast /ovarian carcinoma and recurrent malignant ascites. Plan is for tunneled peritoneal catheter placement today. Details/risks of procedure d/w pt/husband with their understanding and consent.  ALLRED,D KEVIN 10/29/2012, 12:35 PM

## 2012-10-29 NOTE — H&P (Signed)
Agree 

## 2012-10-29 NOTE — Procedures (Signed)
Procedure:  Tunneled peritoneal drainage catheter placement Findings:  16 Fr Bard Aspira catheter placed via RLQ approach.  2 L of ascites removed.  No complications.

## 2012-11-02 ENCOUNTER — Encounter (HOSPITAL_COMMUNITY): Payer: Self-pay | Admitting: *Deleted

## 2012-11-02 ENCOUNTER — Other Ambulatory Visit: Payer: Self-pay | Admitting: Internal Medicine

## 2012-11-02 ENCOUNTER — Emergency Department (HOSPITAL_COMMUNITY): Payer: Medicare Other

## 2012-11-02 ENCOUNTER — Inpatient Hospital Stay (HOSPITAL_COMMUNITY)
Admission: EM | Admit: 2012-11-02 | Discharge: 2012-11-13 | DRG: 754 | Disposition: A | Discharge status: Home Health | Payer: Medicare Other | Attending: Internal Medicine | Admitting: Internal Medicine

## 2012-11-02 DIAGNOSIS — K5669 Other intestinal obstruction: Secondary | ICD-10-CM | POA: Diagnosis present

## 2012-11-02 DIAGNOSIS — R109 Unspecified abdominal pain: Secondary | ICD-10-CM

## 2012-11-02 DIAGNOSIS — E119 Type 2 diabetes mellitus without complications: Secondary | ICD-10-CM | POA: Diagnosis present

## 2012-11-02 DIAGNOSIS — R000 Tachycardia, unspecified: Secondary | ICD-10-CM | POA: Diagnosis present

## 2012-11-02 DIAGNOSIS — B373 Candidiasis of vulva and vagina: Secondary | ICD-10-CM | POA: Diagnosis present

## 2012-11-02 DIAGNOSIS — F329 Major depressive disorder, single episode, unspecified: Secondary | ICD-10-CM | POA: Diagnosis present

## 2012-11-02 DIAGNOSIS — F17200 Nicotine dependence, unspecified, uncomplicated: Secondary | ICD-10-CM | POA: Diagnosis present

## 2012-11-02 DIAGNOSIS — F411 Generalized anxiety disorder: Secondary | ICD-10-CM | POA: Diagnosis present

## 2012-11-02 DIAGNOSIS — K56609 Unspecified intestinal obstruction, unspecified as to partial versus complete obstruction: Secondary | ICD-10-CM

## 2012-11-02 DIAGNOSIS — R180 Malignant ascites: Secondary | ICD-10-CM

## 2012-11-02 DIAGNOSIS — R112 Nausea with vomiting, unspecified: Secondary | ICD-10-CM

## 2012-11-02 DIAGNOSIS — E860 Dehydration: Secondary | ICD-10-CM | POA: Diagnosis present

## 2012-11-02 DIAGNOSIS — K819 Cholecystitis, unspecified: Secondary | ICD-10-CM | POA: Diagnosis present

## 2012-11-02 DIAGNOSIS — J4489 Other specified chronic obstructive pulmonary disease: Secondary | ICD-10-CM | POA: Diagnosis present

## 2012-11-02 DIAGNOSIS — D638 Anemia in other chronic diseases classified elsewhere: Secondary | ICD-10-CM | POA: Diagnosis present

## 2012-11-02 DIAGNOSIS — Z90710 Acquired absence of both cervix and uterus: Secondary | ICD-10-CM

## 2012-11-02 DIAGNOSIS — R140 Abdominal distension (gaseous): Secondary | ICD-10-CM

## 2012-11-02 DIAGNOSIS — J209 Acute bronchitis, unspecified: Secondary | ICD-10-CM

## 2012-11-02 DIAGNOSIS — I059 Rheumatic mitral valve disease, unspecified: Secondary | ICD-10-CM | POA: Diagnosis present

## 2012-11-02 DIAGNOSIS — Z9221 Personal history of antineoplastic chemotherapy: Secondary | ICD-10-CM

## 2012-11-02 DIAGNOSIS — J449 Chronic obstructive pulmonary disease, unspecified: Secondary | ICD-10-CM | POA: Diagnosis present

## 2012-11-02 DIAGNOSIS — Z853 Personal history of malignant neoplasm of breast: Secondary | ICD-10-CM

## 2012-11-02 DIAGNOSIS — R188 Other ascites: Secondary | ICD-10-CM

## 2012-11-02 DIAGNOSIS — K219 Gastro-esophageal reflux disease without esophagitis: Secondary | ICD-10-CM | POA: Diagnosis present

## 2012-11-02 DIAGNOSIS — E876 Hypokalemia: Secondary | ICD-10-CM | POA: Diagnosis present

## 2012-11-02 DIAGNOSIS — C569 Malignant neoplasm of unspecified ovary: Principal | ICD-10-CM

## 2012-11-02 DIAGNOSIS — B3731 Acute candidiasis of vulva and vagina: Secondary | ICD-10-CM | POA: Diagnosis present

## 2012-11-02 DIAGNOSIS — Z79899 Other long term (current) drug therapy: Secondary | ICD-10-CM

## 2012-11-02 DIAGNOSIS — F3289 Other specified depressive episodes: Secondary | ICD-10-CM | POA: Diagnosis present

## 2012-11-02 DIAGNOSIS — Z8673 Personal history of transient ischemic attack (TIA), and cerebral infarction without residual deficits: Secondary | ICD-10-CM

## 2012-11-02 DIAGNOSIS — C786 Secondary malignant neoplasm of retroperitoneum and peritoneum: Secondary | ICD-10-CM | POA: Diagnosis present

## 2012-11-02 DIAGNOSIS — E43 Unspecified severe protein-calorie malnutrition: Secondary | ICD-10-CM

## 2012-11-02 DIAGNOSIS — K56600 Partial intestinal obstruction, unspecified as to cause: Secondary | ICD-10-CM

## 2012-11-02 DIAGNOSIS — IMO0002 Reserved for concepts with insufficient information to code with codable children: Secondary | ICD-10-CM

## 2012-11-02 LAB — CBC WITH DIFFERENTIAL/PLATELET
Basophils Absolute: 0 10*3/uL (ref 0.0–0.1)
HCT: 31.1 % — ABNORMAL LOW (ref 36.0–46.0)
Lymphocytes Relative: 16 % (ref 12–46)
Monocytes Relative: 7 % (ref 3–12)
Platelets: 888 10*3/uL — ABNORMAL HIGH (ref 150–400)
RBC: 3.54 MIL/uL — ABNORMAL LOW (ref 3.87–5.11)
RDW: 14.4 % (ref 11.5–15.5)
WBC: 14 10*3/uL — ABNORMAL HIGH (ref 4.0–10.5)

## 2012-11-02 LAB — BASIC METABOLIC PANEL
Chloride: 94 mEq/L — ABNORMAL LOW (ref 96–112)
GFR calc Af Amer: >90 mL/min (ref >90)
Potassium: 4.4 mEq/L (ref 3.5–5.1)

## 2012-11-02 LAB — GLUCOSE, CAPILLARY: Glucose-Capillary: 135 mg/dL — ABNORMAL HIGH (ref 70–99)

## 2012-11-02 LAB — PHOSPHORUS: Phosphorus: 4.5 mg/dL (ref 2.3–4.6)

## 2012-11-02 LAB — MAGNESIUM: Magnesium: 1.2 mg/dL — ABNORMAL LOW (ref 1.5–2.5)

## 2012-11-02 MED ORDER — MORPHINE SULFATE 4 MG/ML IJ SOLN
4.0000 mg | INTRAMUSCULAR | Status: DC | PRN
  Administered 2012-11-02 – 2012-11-13 (×17): 4 mg via INTRAVENOUS
  Filled 2012-11-02 (×21): qty 1

## 2012-11-02 MED ORDER — ONDANSETRON HCL 4 MG/2ML IJ SOLN
INTRAMUSCULAR | Status: AC
  Administered 2012-11-02: 4 mg via INTRAVENOUS
  Filled 2012-11-02: qty 2

## 2012-11-02 MED ORDER — ONDANSETRON HCL 4 MG/2ML IJ SOLN: 4.0000 mg | Freq: Once | INTRAMUSCULAR | Status: AC

## 2012-11-02 MED ORDER — IOHEXOL 300 MG/ML  SOLN
100.0000 mL | Freq: Once | INTRAMUSCULAR | Status: AC | PRN
  Administered 2012-11-02: 100 mL via INTRAVENOUS

## 2012-11-02 MED ORDER — ONDANSETRON HCL 4 MG/2ML IJ SOLN
4.0000 mg | Freq: Four times a day (QID) | INTRAMUSCULAR | Status: DC | PRN
  Administered 2012-11-02 – 2012-11-07 (×10): 4 mg via INTRAVENOUS
  Filled 2012-11-02 (×11): qty 2

## 2012-11-02 MED ORDER — IOHEXOL 300 MG/ML  SOLN
50.0000 mL | Freq: Once | INTRAMUSCULAR | Status: AC | PRN
  Administered 2012-11-02: 50 mL via ORAL

## 2012-11-02 MED ORDER — MAGNESIUM SULFATE 40 MG/ML IJ SOLN
2.0000 g | Freq: Once | INTRAMUSCULAR | Status: AC
  Administered 2012-11-03: 2 g via INTRAVENOUS
  Filled 2012-11-02: qty 50

## 2012-11-02 MED ORDER — FENTANYL CITRATE 0.05 MG/ML IJ SOLN
INTRAMUSCULAR | Status: AC
  Administered 2012-11-02: 50 ug
  Filled 2012-11-02: qty 2

## 2012-11-02 MED ORDER — SODIUM CHLORIDE 0.9 % IV BOLUS (SEPSIS)
1000.0000 mL | Freq: Once | INTRAVENOUS | Status: AC
  Administered 2012-11-02: 1000 mL via INTRAVENOUS

## 2012-11-02 NOTE — ED Notes (Signed)
Pt reports currently being treated with chemo for ovarian cancer, hx of breast cancer, left sided restricted extremity. Pt had chemo last on Tuesday 3/25, was seen 3/27 for periotoneal draining of ascites 3 liters drained off. Pt reports n/v since Friday and increased weakness. Reports chronic left sided abdominal discomfort 5/10.

## 2012-11-02 NOTE — ED Notes (Signed)
Patient reports decreased appetite and decreased intake of fluids for about three days.  Patient having chemo treatment for ovarian cancer.

## 2012-11-02 NOTE — ED Notes (Signed)
Power port accessed on right chest with 1 inch Huber needle using sterile technique.  Lab samples obtained at this time.  Good blood return and flushed well.  Normal saline infusing on pump.  Patient tolerated well.

## 2012-11-02 NOTE — ED Provider Notes (Signed)
History     CSN: [id number]  Arrival date & time 11/02/12  1820   First MD Initiated Contact with Patient 11/02/12 1947      Chief Complaint  Patient presents with  . cancer pt   . Emesis    (Consider location/radiation/quality/duration/timing/severity/associated sxs/prior treatment) HPI Comments: Pt has ovarian ca and comes in cc of nausea and emesis. Pt is on chemo, called her oncologist, who requested that patient come in for further evaluation. Only 1 episode of emesis today, however, pt has been nauseated the entire time since Thursday. She has some diffuse abd pain with the nausea. No diarrhea. She admits to feeling tired, and weak. She has decreased urine output and very poor appetite. Pt also has hx of COPD, HTN. No other complains.  Patient is a 72 y.o. female presenting with vomiting. The history is provided by the patient.  Emesis Severity:  Mild Duration:  3 days Timing:  Sporadic Quality:  Unable to specify Progression:  Unchanged Chronicity:  New Recent urination:  Decreased Relieved by:  Nothing Ineffective treatments:  Antiemetics Associated symptoms: abdominal pain   Associated symptoms: no chills, no diarrhea, no fever and no headaches     Past Medical History  Diagnosis Date  . History of breast cancer   . COPD (chronic obstructive pulmonary disease)   . GERD (gastroesophageal reflux disease)   . Diabetes mellitus     type II  . Hx of colonic polyps   . Osteoarthritis   . MVP (mitral valve prolapse)   . Dysrhythmia     pt states runs a little fast   . Shortness of breath     with exertion   . Depression   . Anxiety   . Heart murmur   . Cancer     left breast  . Ovarian cancer     active chemotherapy  . Stroke   . Ascites 10/22/2012    Past Surgical History  Procedure Laterality Date  . Cesarean section  1980, 1982    X 2   . Laparoscopy      work up for infertility  . Tympanoplasty      left X 2  . Left breast fibroadenoma removal   1960's  . Breast cyst aspirations    . Left modified radial mastectomy    . Laparotomy  10/29/2011    Procedure: EXPLORATORY LAPAROTOMY;  Surgeon: Rejeana Brock A. Duard Brady, MD;  Location: [facility];  Service: Gynecology;  Laterality: N/A;  . Abdominal hysterectomy  10/29/2011    Procedure: HYSTERECTOMY ABDOMINAL;  Surgeon: Rejeana Brock A. Duard Brady, MD;  Location: [facility];  Service: Gynecology;  Laterality: N/A;  Total abdominal hysterectomy  . Salpingoophorectomy  10/29/2011    Procedure: SALPINGO OOPHERECTOMY;  Surgeon: Rejeana Brock A. Duard Brady, MD;  Location: [facility];  Service: Gynecology;  Laterality: Bilateral;    Family History  Problem Relation Age of Onset  . Cancer Mother     endometrial/ colon  . Cancer Brother     colon  . COPD Other   . Hypertension Other   . Cancer Maternal Uncle     prostate/bladder/lung  . Cancer Paternal Aunt     unknown abdominal cancer  . Cancer Maternal Grandmother     Salivary gland cancer  . Cancer Paternal Aunt     leukemia    History  Substance Use Topics  . Smoking status: Current Every Day Smoker -- 0.50 packs/day for 30 years    Types: Cigarettes  . Smokeless tobacco:  Never Used     Comment: quit again 2009; restarted 1 ppd 2011  . Alcohol Use: No    OB History   Grav Para Term Preterm Abortions TAB SAB Ect Mult Living                  Review of Systems  Constitutional: Positive for activity change and fatigue. Negative for fever and chills.  HENT: Negative for neck pain.   Respiratory: Negative for shortness of breath.   Cardiovascular: Negative for chest pain.  Gastrointestinal: Positive for nausea, vomiting and abdominal pain. Negative for diarrhea, constipation and blood in stool.  Genitourinary: Positive for decreased urine volume. Negative for dysuria.  Neurological: Negative for headaches.    Allergies  Review of patient's allergies indicates no known allergies.  Home Medications   Current Outpatient Rx  Name  Route  Sig  Dispense  Refill  .  albuterol-ipratropium (COMBIVENT) 18-103 MCG/ACT inhaler   Inhalation   Inhale 2 puffs into the lungs every 4 (four) hours as needed. For wheezing   14.7 Inhaler   3   . atenolol (TENORMIN) 25 MG tablet   Oral   Take 12.5 mg by mouth 2 (two) times daily.         Marland Kitchen b complex vitamins tablet   Oral   Take 1 tablet by mouth daily.           . budesonide-formoterol (SYMBICORT) 160-4.5 MCG/ACT inhaler   Inhalation   Inhale 2 puffs into the lungs 2 (two) times daily.         Marland Kitchen buPROPion (WELLBUTRIN SR) 150 MG 12 hr tablet   Oral   Take 1 tablet (150 mg total) by mouth 2 (two) times daily.   180 tablet   3   . busPIRone (BUSPAR) 15 MG tablet   Oral   Take 1 tablet (15 mg total) by mouth 2 (two) times daily.   180 tablet   2   . cholecalciferol (VITAMIN D) 1000 UNITS tablet   Oral   Take 1,000 Units by mouth daily.           . clopidogrel (PLAVIX) 75 MG tablet   Oral   Take 1 tablet (75 mg total) by mouth daily with breakfast.   60 tablet   3   . diazepam (VALIUM) 5 MG tablet   Oral   Take 5-10 mg by mouth at bedtime as needed for sleep.         . ferrous sulfate 325 (65 FE) MG tablet   Oral   Take 325 mg by mouth daily with breakfast.         . lidocaine-prilocaine (EMLA) cream   Topical   Apply 1 application topically as needed. For treatments         . lovastatin (MEVACOR) 40 MG tablet   Oral   Take 1 tablet (40 mg total) by mouth daily.   90 tablet   2   . Multiple Vitamin (MULTIVITAMIN WITH MINERALS) TABS   Oral   Take 1 tablet by mouth daily.         Marland Kitchen omeprazole (PRILOSEC) 40 MG capsule   Oral   Take 1 capsule (40 mg total) by mouth daily.   90 capsule   3   . ondansetron (ZOFRAN) 8 MG tablet   Oral   Take 1 tablet (8 mg total) by mouth 2 (two) times daily. Take two times a day starting the day after chemo for  2 days. Then take two times a day as needed for nausea or vomiting.   30 tablet   1   . prochlorperazine (COMPAZINE) 10  MG tablet   Oral   Take 1 tablet (10 mg total) by mouth every 6 (six) hours as needed (Nausea or vomiting).   30 tablet   1   . senna (SENOKOT) 8.6 MG tablet   Oral   Take 1 tablet by mouth daily as needed for constipation.         . sertraline (ZOLOFT) 100 MG tablet   Oral   Take 1 tablet (100 mg total) by mouth daily before breakfast.   90 tablet   3     BP 112/55  Pulse 104  Temp(Src) 98.1 F (36.7 C) (Oral)  Resp 16  SpO2 94%  Physical Exam  Nursing note and vitals reviewed. Constitutional: She is oriented to person, place, and time. She appears well-developed and well-nourished.  HENT:  Head: Normocephalic and atraumatic.  Eyes: EOM are normal. Pupils are equal, round, and reactive to light.  Neck: Neck supple.  Cardiovascular: Regular rhythm and normal heart sounds.   No murmur heard. Pulmonary/Chest: Effort normal. No respiratory distress.  Right sided port - clean  Abdominal: Soft. She exhibits no distension. There is no tenderness. There is no rebound and no guarding.  Diffuse abd tenderness - with no peritoneal signs. Pt has right sided cath - no signs of infection, just ecchymoses.  Neurological: She is alert and oriented to person, place, and time.  Skin: Skin is warm and dry.    ED Course  Procedures (including critical care time)  Labs Reviewed  GLUCOSE, CAPILLARY - Abnormal; Notable for the following:    Glucose-Capillary 135 (*)    All other components within normal limits  CBC WITH DIFFERENTIAL  BASIC METABOLIC PANEL  MAGNESIUM  PHOSPHORUS   No results found.   No diagnosis found.    MDM  Pt comes in with cc of weakness, nausea. Pt has ovarian CA, undergoing chemo, and is extremely nauseated with decreased po intake. Will get orthostatics, hydrate and get basic labs. Will try to get better nausea control and try fluid challenge. Pt wants to be admitted, but we will see if we can get her feeling better.   Derwood Kaplan,  MD 11/02/12 2148

## 2012-11-02 NOTE — ED Notes (Signed)
[id number]<NU> Expected date:<BR> Expected time:<BR> Means of arrival:<BR> Comments:<BR> Triage dale

## 2012-11-03 ENCOUNTER — Encounter (HOSPITAL_COMMUNITY): Payer: Self-pay | Admitting: *Deleted

## 2012-11-03 DIAGNOSIS — C569 Malignant neoplasm of unspecified ovary: Principal | ICD-10-CM

## 2012-11-03 DIAGNOSIS — K56609 Unspecified intestinal obstruction, unspecified as to partial versus complete obstruction: Secondary | ICD-10-CM

## 2012-11-03 DIAGNOSIS — R180 Malignant ascites: Secondary | ICD-10-CM

## 2012-11-03 DIAGNOSIS — R112 Nausea with vomiting, unspecified: Secondary | ICD-10-CM

## 2012-11-03 DIAGNOSIS — K56600 Partial intestinal obstruction, unspecified as to cause: Secondary | ICD-10-CM | POA: Diagnosis present

## 2012-11-03 LAB — CBC
HCT: 27.4 % — ABNORMAL LOW (ref 36.0–46.0)
MCH: 29.5 pg (ref 26.0–34.0)
MCV: 87.8 fL (ref 78.0–100.0)
Platelets: 770 10*3/uL — ABNORMAL HIGH (ref 150–400)
RBC: 3.12 MIL/uL — ABNORMAL LOW (ref 3.87–5.11)
WBC: 13.8 10*3/uL — ABNORMAL HIGH (ref 4.0–10.5)

## 2012-11-03 LAB — GLUCOSE, CAPILLARY: Glucose-Capillary: 100 mg/dL — ABNORMAL HIGH (ref 70–99)

## 2012-11-03 LAB — BASIC METABOLIC PANEL
BUN: 11 mg/dL (ref 6–23)
CO2: 26 mEq/L (ref 19–32)
Calcium: 7.6 mg/dL — ABNORMAL LOW (ref 8.4–10.5)
Chloride: 100 mEq/L (ref 96–112)
Creatinine, Ser: 0.69 mg/dL (ref 0.50–1.10)
Glucose, Bld: 108 mg/dL — ABNORMAL HIGH (ref 70–99)

## 2012-11-03 MED ORDER — SIMVASTATIN 20 MG PO TABS
20.0000 mg | ORAL_TABLET | Freq: Every day | ORAL | Status: DC
  Administered 2012-11-03 – 2012-11-07 (×5): 20 mg via ORAL
  Filled 2012-11-03 (×6): qty 1

## 2012-11-03 MED ORDER — IPRATROPIUM BROMIDE 0.02 % IN SOLN
0.5000 mg | RESPIRATORY_TRACT | Status: DC
  Administered 2012-11-03 (×2): 0.5 mg via RESPIRATORY_TRACT
  Filled 2012-11-03 (×2): qty 2.5

## 2012-11-03 MED ORDER — LIDOCAINE-PRILOCAINE 2.5-2.5 % EX CREA
1.0000 "application " | TOPICAL_CREAM | CUTANEOUS | Status: DC | PRN
  Filled 2012-11-03: qty 5

## 2012-11-03 MED ORDER — CLOPIDOGREL BISULFATE 75 MG PO TABS
75.0000 mg | ORAL_TABLET | Freq: Every day | ORAL | Status: DC
  Administered 2012-11-03 – 2012-11-13 (×11): 75 mg via ORAL
  Filled 2012-11-03 (×14): qty 1

## 2012-11-03 MED ORDER — PANTOPRAZOLE SODIUM 40 MG PO TBEC
80.0000 mg | DELAYED_RELEASE_TABLET | Freq: Every day | ORAL | Status: DC
  Administered 2012-11-03 – 2012-11-08 (×6): 80 mg via ORAL
  Filled 2012-11-03 (×6): qty 2

## 2012-11-03 MED ORDER — SERTRALINE HCL 100 MG PO TABS
100.0000 mg | ORAL_TABLET | Freq: Every day | ORAL | Status: DC
  Administered 2012-11-03 – 2012-11-13 (×11): 100 mg via ORAL
  Filled 2012-11-03 (×14): qty 1

## 2012-11-03 MED ORDER — BUPROPION HCL ER (SR) 150 MG PO TB12
150.0000 mg | ORAL_TABLET | Freq: Two times a day (BID) | ORAL | Status: DC
  Administered 2012-11-03 – 2012-11-13 (×21): 150 mg via ORAL
  Filled 2012-11-03 (×23): qty 1

## 2012-11-03 MED ORDER — PROCHLORPERAZINE MALEATE 10 MG PO TABS
10.0000 mg | ORAL_TABLET | Freq: Four times a day (QID) | ORAL | Status: DC | PRN
  Administered 2012-11-03 – 2012-11-06 (×3): 10 mg via ORAL
  Filled 2012-11-03 (×4): qty 1

## 2012-11-03 MED ORDER — KETOROLAC TROMETHAMINE 30 MG/ML IJ SOLN
30.0000 mg | Freq: Once | INTRAMUSCULAR | Status: AC
  Administered 2012-11-04: 30 mg via INTRAVENOUS
  Filled 2012-11-03: qty 1

## 2012-11-03 MED ORDER — SODIUM CHLORIDE 0.9 % IV BOLUS (SEPSIS)
500.0000 mL | Freq: Once | INTRAVENOUS | Status: AC
  Administered 2012-11-03: 500 mL via INTRAVENOUS

## 2012-11-03 MED ORDER — ALBUTEROL SULFATE (5 MG/ML) 0.5% IN NEBU: 2.5000 mg | INHALATION_SOLUTION | RESPIRATORY_TRACT | Status: DC | PRN

## 2012-11-03 MED ORDER — IPRATROPIUM-ALBUTEROL 20-100 MCG/ACT IN AERS
1.0000 | INHALATION_SPRAY | RESPIRATORY_TRACT | Status: DC | PRN
  Filled 2012-11-03: qty 4

## 2012-11-03 MED ORDER — ONDANSETRON HCL 8 MG PO TABS: 8.0000 mg | ORAL_TABLET | Freq: Three times a day (TID) | ORAL | Status: DC | PRN

## 2012-11-03 MED ORDER — IPRATROPIUM-ALBUTEROL 18-103 MCG/ACT IN AERO
2.0000 | INHALATION_SPRAY | RESPIRATORY_TRACT | Status: DC | PRN
  Filled 2012-11-03 (×2): qty 14.7

## 2012-11-03 MED ORDER — SODIUM CHLORIDE 0.9 % IV BOLUS (SEPSIS)
1000.0000 mL | Freq: Once | INTRAVENOUS | Status: AC
  Administered 2012-11-03: 1000 mL via INTRAVENOUS

## 2012-11-03 MED ORDER — DIAZEPAM 5 MG PO TABS
5.0000 mg | ORAL_TABLET | Freq: Once | ORAL | Status: AC
  Administered 2012-11-03: 5 mg via ORAL
  Filled 2012-11-03: qty 1

## 2012-11-03 MED ORDER — SODIUM CHLORIDE 0.9 % IV SOLN
INTRAVENOUS | Status: DC
  Administered 2012-11-03 (×3): via INTRAVENOUS

## 2012-11-03 MED ORDER — SODIUM CHLORIDE 0.9 % IV BOLUS (SEPSIS): 500.0000 mL | Freq: Once | INTRAVENOUS | Status: DC

## 2012-11-03 MED ORDER — OXYCODONE HCL 5 MG PO TABS
5.0000 mg | ORAL_TABLET | Freq: Four times a day (QID) | ORAL | Status: DC | PRN
  Administered 2012-11-03 – 2012-11-05 (×6): 5 mg via ORAL
  Filled 2012-11-03 (×7): qty 1

## 2012-11-03 MED ORDER — ALBUTEROL SULFATE (5 MG/ML) 0.5% IN NEBU
2.5000 mg | INHALATION_SOLUTION | RESPIRATORY_TRACT | Status: DC
  Administered 2012-11-04 (×4): 2.5 mg via RESPIRATORY_TRACT
  Filled 2012-11-03 (×4): qty 0.5

## 2012-11-03 MED ORDER — BUSPIRONE HCL 15 MG PO TABS
15.0000 mg | ORAL_TABLET | Freq: Two times a day (BID) | ORAL | Status: DC
  Administered 2012-11-03 – 2012-11-13 (×21): 15 mg via ORAL
  Filled 2012-11-03 (×24): qty 1

## 2012-11-03 MED ORDER — BIOTENE DRY MOUTH MT LIQD
15.0000 mL | Freq: Two times a day (BID) | OROMUCOSAL | Status: DC
  Administered 2012-11-03 – 2012-11-13 (×20): 15 mL via OROMUCOSAL

## 2012-11-03 MED ORDER — SODIUM CHLORIDE 0.9 % IV BOLUS (SEPSIS)
500.0000 mL | Freq: Once | INTRAVENOUS | Status: AC
  Administered 2012-11-03: 11:00:00 via INTRAVENOUS

## 2012-11-03 MED ORDER — BUDESONIDE-FORMOTEROL FUMARATE 160-4.5 MCG/ACT IN AERO
2.0000 | INHALATION_SPRAY | Freq: Two times a day (BID) | RESPIRATORY_TRACT | Status: DC
  Administered 2012-11-03 – 2012-11-13 (×21): 2 via RESPIRATORY_TRACT
  Filled 2012-11-03: qty 6

## 2012-11-03 MED ORDER — HEPARIN SODIUM (PORCINE) 5000 UNIT/ML IJ SOLN
5000.0000 [IU] | Freq: Three times a day (TID) | INTRAMUSCULAR | Status: DC
  Administered 2012-11-03 – 2012-11-06 (×9): 5000 [IU] via SUBCUTANEOUS
  Filled 2012-11-03 (×13): qty 1

## 2012-11-03 MED ORDER — CHLORHEXIDINE GLUCONATE 0.12 % MT SOLN
15.0000 mL | Freq: Two times a day (BID) | OROMUCOSAL | Status: DC
  Administered 2012-11-03 – 2012-11-09 (×13): 15 mL via OROMUCOSAL
  Filled 2012-11-03 (×16): qty 15

## 2012-11-03 MED ORDER — ATENOLOL 12.5 MG HALF TABLET
12.5000 mg | ORAL_TABLET | Freq: Two times a day (BID) | ORAL | Status: DC
  Administered 2012-11-03 – 2012-11-13 (×8): 12.5 mg via ORAL
  Filled 2012-11-03 (×23): qty 1

## 2012-11-03 MED ORDER — IPRATROPIUM BROMIDE 0.02 % IN SOLN
0.5000 mg | RESPIRATORY_TRACT | Status: DC
  Administered 2012-11-04 (×4): 0.5 mg via RESPIRATORY_TRACT
  Filled 2012-11-03 (×4): qty 2.5

## 2012-11-03 MED ORDER — CEFTRIAXONE SODIUM 2 G IJ SOLR
2.0000 g | Freq: Once | INTRAMUSCULAR | Status: AC
  Administered 2012-11-03: 2 g via INTRAVENOUS
  Filled 2012-11-03: qty 2

## 2012-11-03 MED ORDER — ALBUTEROL SULFATE (5 MG/ML) 0.5% IN NEBU
2.5000 mg | INHALATION_SOLUTION | RESPIRATORY_TRACT | Status: DC
  Administered 2012-11-03 (×2): 2.5 mg via RESPIRATORY_TRACT
  Filled 2012-11-03 (×2): qty 0.5

## 2012-11-03 NOTE — Progress Notes (Signed)
Patient c/o back pain and headache,Dr. Dhungel notified,new order received,made aware of patient's BP after the bolus of 500cc NS.- Hulda Marin RN

## 2012-11-03 NOTE — Progress Notes (Signed)
Patient seen and examined this morning. Still having few episodes of vomiting. Admission H&P reviewed. Vitals stable , appears dehydrated.[name] [name] abdominal exam with good bowel sounds, non distended but LLQ tenderness present.  Continue NPO with ice chips. Serial abdominal exam. Will repeat abdominal xray in am. Replenished low mg -will inform her oncologist Dr Darnelle Catalan about her admission.

## 2012-11-03 NOTE — Progress Notes (Signed)
Patient vomitedx1, 300 cc greenish in color.Will continue to monitor patient.- Hulda Marin RN

## 2012-11-03 NOTE — Progress Notes (Signed)
Patient's BP was 85/31, Dr. Theda Belfast notified, ordered 500NS bolusx1 and to hold BP med.Hulda Marin RN

## 2012-11-03 NOTE — Care Management Note (Addendum)
CARE MANAGEMENT NOTE 11/03/2012  Patient:  [name] [name], [name] [name]   Account Number:  [id number]  Date Initiated:  11/03/2012  Documentation initiated by:  Nathan Moctezuma  Subjective/Objective Assessment:   72 yo female admitted with partial SBO. PTA pt independent.     Action/Plan:   Home when stable   Anticipated DC Date:     Anticipated DC Plan:  HOME with [facility] services         Choice offered to / List presented to:  NA           Status of service:  In process, will continue to follow Medicare Important Message given?   (If response is "NO", the following Medicare IM given date fields will be blank) Date Medicare IM given:   Date Additional Medicare IM given:    Discharge Disposition:    Per UR Regulation:  Reviewed for med. necessity/level of care/duration of stay  If discussed at Long Length of Stay Meetings, dates discussed:    Comments:  11/03/12  1525 Chart reviewed. UR complete. Will follow for any further discharge needs.

## 2012-11-03 NOTE — H&P (Signed)
[facility] History and Physical  [name] [name] ZOX:[id number] DOB: [birth year] DOA: 11/02/2012  Referring physician: ED PCP: Laurette Schimke, MD  Specialists: None  Chief Complaint: N/V  HPI: [name] [name] is a 72 y.o. 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  Her symptoms are associated with some diffuse mild abdominal pain, no diarrhea.  She also has decreased UOP and very poor appetite.  In the ED, she was found to have early or partial SBO on CT abdomen.  Hospitalist has been asked to admit the patient.  Review of Systems: 12 systems reviewed and otherwise negative.  Past Medical History  Diagnosis Date  . History of breast cancer   . COPD (chronic obstructive pulmonary disease)   . GERD (gastroesophageal reflux disease)   . Diabetes mellitus     type II  . Hx of colonic polyps   . Osteoarthritis   . MVP (mitral valve prolapse)   . Dysrhythmia     pt states runs a little fast   . Shortness of breath     with exertion   . Depression   . Anxiety   . Heart murmur   . Cancer     left breast  . Ovarian cancer     active chemotherapy  . Stroke   . Ascites 10/22/2012   Past Surgical History  Procedure Laterality Date  . Cesarean section  1980, 1982    X 2   . Laparoscopy      work up for infertility  . Tympanoplasty      left X 2  . Left breast fibroadenoma removal  1960's  . Breast cyst aspirations    . Left modified radial mastectomy    . Laparotomy  10/29/2011    Procedure: EXPLORATORY LAPAROTOMY;  Surgeon: Rejeana Brock A. Duard Brady, MD;  Location: [facility];  Service: Gynecology;  Laterality: N/A;  . Abdominal hysterectomy  10/29/2011    Procedure: HYSTERECTOMY ABDOMINAL;  Surgeon: Rejeana Brock A. Duard Brady, MD;  Location: [facility];  Service: Gynecology;  Laterality: N/A;  Total abdominal hysterectomy  . Salpingoophorectomy  10/29/2011    Procedure: SALPINGO OOPHERECTOMY;  Surgeon:  Rejeana Brock A. Duard Brady, MD;  Location: [facility];  Service: Gynecology;  Laterality: Bilateral;   Social History:  reports that she has been smoking Cigarettes.  She has a 15 pack-year smoking history. She has never used smokeless tobacco. She reports that she does not drink alcohol or use illicit drugs.   No Known Allergies  Family History  Problem Relation Age of Onset  . Cancer Mother     endometrial/ colon  . Cancer Brother     colon  . COPD Other   . Hypertension Other   . Cancer Maternal Uncle     prostate/bladder/lung  . Cancer Paternal Aunt     unknown abdominal cancer  . Cancer Maternal Grandmother     Salivary gland cancer  . Cancer Paternal Aunt     leukemia   Prior to Admission medications   Medication Sig Start Date End Date Taking? Authorizing Provider  albuterol-ipratropium (COMBIVENT) 18-103 MCG/ACT inhaler Inhale 2 puffs into the lungs every 4 (four) hours as needed. For wheezing 04/21/12  Yes Georgina Quint Plotnikov, MD  atenolol (TENORMIN) 25 MG tablet Take 12.5 mg by mouth 2 (two) times daily.   Yes Historical Provider, MD  b complex vitamins tablet Take 1 tablet by mouth daily.  Yes Historical Provider, MD  budesonide-formoterol (SYMBICORT) 160-4.5 MCG/ACT inhaler Inhale 2 puffs into the lungs 2 (two) times daily.   Yes Historical Provider, MD  buPROPion (WELLBUTRIN SR) 150 MG 12 hr tablet Take 1 tablet (150 mg total) by mouth 2 (two) times daily. 07/17/12 07/17/13 Yes Georgina Quint Plotnikov, MD  busPIRone (BUSPAR) 15 MG tablet Take 1 tablet (15 mg total) by mouth 2 (two) times daily. 07/01/12 07/01/13 Yes Georgina Quint Plotnikov, MD  cholecalciferol (VITAMIN D) 1000 UNITS tablet Take 1,000 Units by mouth daily.     Yes Historical Provider, MD  clopidogrel (PLAVIX) 75 MG tablet Take 1 tablet (75 mg total) by mouth daily with breakfast. 10/02/12 10/02/13 Yes Georgina Quint Plotnikov, MD  diazepam (VALIUM) 5 MG tablet Take 5-10 mg by mouth at bedtime as needed for sleep.   Yes Historical  Provider, MD  ferrous sulfate 325 (65 FE) MG tablet Take 325 mg by mouth daily with breakfast.   Yes Historical Provider, MD  lidocaine-prilocaine (EMLA) cream Apply 1 application topically as needed. For treatments 12/02/11 12/01/12 Yes Amy Allegra Grana, PA-C  lovastatin (MEVACOR) 40 MG tablet Take 1 tablet (40 mg total) by mouth daily. 06/15/12  Yes Tresa Garter, MD  Multiple Vitamin (MULTIVITAMIN WITH MINERALS) TABS Take 1 tablet by mouth daily.   Yes Historical Provider, MD  omeprazole (PRILOSEC) 40 MG capsule Take 1 capsule (40 mg total) by mouth daily. 07/08/12 07/08/13 Yes Georgina Quint Plotnikov, MD  ondansetron (ZOFRAN) 8 MG tablet Take 1 tablet (8 mg total) by mouth 2 (two) times daily. Take two times a day starting the day after chemo for 2 days. Then take two times a day as needed for nausea or vomiting. 10/01/12  Yes Lowella Dell, MD  prochlorperazine (COMPAZINE) 10 MG tablet Take 1 tablet (10 mg total) by mouth every 6 (six) hours as needed (Nausea or vomiting). 10/01/12  Yes Lowella Dell, MD  senna (SENOKOT) 8.6 MG tablet Take 1 tablet by mouth daily as needed for constipation.   Yes Historical Provider, MD  sertraline (ZOLOFT) 100 MG tablet Take 1 tablet (100 mg total) by mouth daily before breakfast. 09/22/12  Yes Tresa Garter, MD   Physical Exam: Filed Vitals:   11/02/12 2223 11/02/12 2224 11/02/12 2226 11/03/12 0018  BP: 104/53 106/56 95/51 106/54  Pulse: 106 107 115 103  Temp:    98.8 F (37.1 C)  TempSrc:    Oral  Resp:    19  SpO2:    97%    General:  NAD, resting comfortably in bed Eyes: PEERLA EOMI ENT: mucous membranes moist Neck: supple w/o JVD Cardiovascular: RRR w/o MRG Respiratory: CTA B Abdomen: soft, mild diffuse tenderness, no rebound, no guarding, R sided cath, bs+ Skin: no rash nor lesion Musculoskeletal: MAE, full ROM all 4 extremities Psychiatric: normal tone and affect Neurologic: AAOx3, grossly non-focal  Labs on Admission:  Basic  Metabolic Panel:  Recent Labs Lab 10/27/12 1051 10/29/12 1205 11/02/12 2020  NA 139 135 134*  K 4.7 4.5 4.4  CL 102 100 94*  CO2 25 25 28   GLUCOSE 152* 89 122*  BUN 17.5 19 15   CREATININE 0.8 0.69 0.69  CALCIUM 8.6 8.7 9.0  MG  --   --  1.2*  PHOS  --   --  4.5   Liver Function Tests:  Recent Labs Lab 10/27/12 1051  AST 14  ALT 12  ALKPHOS 83  BILITOT <0.20 Repeated and Verified  PROT  6.3*  ALBUMIN 2.1*   No results found for this basename: LIPASE, AMYLASE,  in the last 168 hours No results found for this basename: AMMONIA,  in the last 168 hours CBC:  Recent Labs Lab 10/27/12 1052 10/29/12 1205 11/02/12 2020  WBC 10.5* 11.1* 14.0*  NEUTROABS 7.5*  --  10.7*  HGB 10.2* 9.4* 10.3*  HCT 31.0* 27.9* 31.1*  MCV 89.6 89.4 87.9  PLT 642* 770* 888*   Cardiac Enzymes: No results found for this basename: CKTOTAL, CKMB, CKMBINDEX, TROPONINI,  in the last 168 hours  BNP (last 3 results) No results found for this basename: PROBNP,  in the last 8760 hours CBG:  Recent Labs Lab 11/02/12 1844  GLUCAP 135*    Radiological Exams on Admission: Ct Abdomen Pelvis W Contrast  11/02/2012  *RADIOLOGY REPORT*  Clinical Data: Abdominal pain.  CT ABDOMEN AND PELVIS WITH CONTRAST  Technique:  Multidetector CT imaging of the abdomen and pelvis was performed following the standard protocol during bolus administration of intravenous contrast.  Contrast: 50mL OMNIPAQUE IOHEXOL 300 MG/ML  SOLN, OMNIPAQUE IOHEXOL 300 MG/ML  SOLN  Comparison: CT abdomen and pelvis 09/24/2012  Findings: The lung bases are clear.  Diffuse infiltration and nodularity in the omentum and mesentery as well as and peritoneal compartments consistent with peritoneal carcinomatosis and appearing relatively stable since the previous study.  There is upper abdominal ascites which has improved since prior study.  The gallbladder is distended, similar to prior study. No bile duct dilatation.  The liver, spleen,  pancreas, adrenal glands, kidneys, inferior vena cava, and retroperitoneal lymph nodes are unremarkable.  There is diffuse calcification of the abdominal aorta without aneurysm.  The stomach, the stomach is not abnormally distended.  Mild dilatation of the distal abdominal small bowel, new since previous study with a few air-fluid levels and some bowel wall thickening.  Terminal ileum is decompressed. Changes suggest obstruction, likely due to adhesions although a neoplastic involvement is not excluded.  Transition zone appears to be in the upper pelvis.  Right lower quadrant drainage catheter is in place.  No free air.  Pelvis:  Stool filled colon is not abnormally distended.  No colonic wall thickening.  Appendix is not appreciated.  Small amount of free fluid in the pelvis.  A heterogeneously enhancing nodules in the lower pelvis consistent with metastasis and stable since previous study.  Bladder wall is not thickened.  Degenerative changes in the lumbar spine.  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 Abdominal ascites is improved since previous study with drainage catheter in place.  Interval development of mild small bowel dilatation with air-fluid levels suggesting early or partial obstruction.   Original Report Authenticated By: Burman Nieves, M.D.     EKG: Independently reviewed.  Assessment/Plan Principal Problem:   Partial small bowel obstruction Active Problems:   Ascites, malignant   Ovarian cancer   1. PSBO - keeping patient NPO except meds, IVF, if additional episodes of N/V then will need NGT. 2. 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 3. Ovarian CA - got chemo last week, next chemo session is 3  weeks from 3/27 per the patient  Informal consult put in computer system (not called) for oncology as is our routine when admitting a chemotherapy patient.  Code Status:  Full Code (must indicate code status--if unknown or must be presumed, indicate so) Family Communication: No family in room (indicate person spoken with, if applicable, with phone number if by telephone) Disposition Plan: Admit to inpatient (indicate anticipated LOS)  Time spent: 50 min  GARDNER, JARED M. [facility] Pager [phone]  If 7PM-7AM, please contact night-coverage [email or web address] Password [email or web address] 11/03/2012, 1:39 AM

## 2012-11-04 ENCOUNTER — Other Ambulatory Visit: Payer: Medicare Other | Admitting: Lab

## 2012-11-04 ENCOUNTER — Inpatient Hospital Stay (HOSPITAL_COMMUNITY): Payer: Medicare Other

## 2012-11-04 ENCOUNTER — Ambulatory Visit: Payer: Medicare Other | Admitting: Oncology

## 2012-11-04 DIAGNOSIS — R109 Unspecified abdominal pain: Secondary | ICD-10-CM

## 2012-11-04 DIAGNOSIS — Z5111 Encounter for antineoplastic chemotherapy: Secondary | ICD-10-CM

## 2012-11-04 DIAGNOSIS — R141 Gas pain: Secondary | ICD-10-CM

## 2012-11-04 DIAGNOSIS — R143 Flatulence: Secondary | ICD-10-CM

## 2012-11-04 LAB — GLUCOSE, CAPILLARY
Glucose-Capillary: 108 mg/dL — ABNORMAL HIGH (ref 70–99)
Glucose-Capillary: 48 mg/dL — ABNORMAL LOW (ref 70–99)
Glucose-Capillary: 85 mg/dL (ref 70–99)
Glucose-Capillary: 64 mg/dL — ABNORMAL LOW (ref 70–99)

## 2012-11-04 LAB — CBC
MCH: 30 pg (ref 26.0–34.0)
Platelets: 602 10*3/uL — ABNORMAL HIGH (ref 150–400)
RBC: 2.9 MIL/uL — ABNORMAL LOW (ref 3.87–5.11)
WBC: 10.4 10*3/uL (ref 4.0–10.5)

## 2012-11-04 LAB — BASIC METABOLIC PANEL
GFR calc Af Amer: >90 mL/min (ref >90)
GFR calc non Af Amer: 89 mL/min — ABNORMAL LOW (ref >90)
Potassium: 3.6 mEq/L (ref 3.5–5.1)
Sodium: 135 mEq/L (ref 135–145)

## 2012-11-04 LAB — MAGNESIUM: Magnesium: 1.5 mg/dL (ref 1.5–2.5)

## 2012-11-04 MED ORDER — DEXTROSE 50 % IV SOLN
INTRAVENOUS | Status: AC
  Administered 2012-11-04: 25 mL
  Filled 2012-11-04: qty 50

## 2012-11-04 MED ORDER — DEXTROSE-NACL 5-0.9 % IV SOLN
INTRAVENOUS | Status: DC
  Administered 2012-11-04 – 2012-11-05 (×2): via INTRAVENOUS
  Administered 2012-11-06: 1000 mL via INTRAVENOUS
  Administered 2012-11-06: 02:00:00 via INTRAVENOUS

## 2012-11-04 MED ORDER — IPRATROPIUM BROMIDE 0.02 % IN SOLN
0.5000 mg | Freq: Four times a day (QID) | RESPIRATORY_TRACT | Status: DC
  Administered 2012-11-05 – 2012-11-13 (×28): 0.5 mg via RESPIRATORY_TRACT
  Filled 2012-11-04 (×31): qty 2.5

## 2012-11-04 MED ORDER — DEXTROSE 50 % IV SOLN
25.0000 mL | Freq: Once | INTRAVENOUS | Status: AC | PRN
  Administered 2012-11-04: 25 mL via INTRAVENOUS

## 2012-11-04 MED ORDER — ALBUTEROL SULFATE (5 MG/ML) 0.5% IN NEBU
2.5000 mg | INHALATION_SOLUTION | Freq: Four times a day (QID) | RESPIRATORY_TRACT | Status: DC
  Administered 2012-11-05 – 2012-11-13 (×28): 2.5 mg via RESPIRATORY_TRACT
  Filled 2012-11-04 (×31): qty 0.5

## 2012-11-04 MED ORDER — DEXTROSE 50 % IV SOLN
25.0000 mL | Freq: Once | INTRAVENOUS | Status: AC
  Administered 2012-11-04: 25 mL via INTRAVENOUS

## 2012-11-04 MED ORDER — DIAZEPAM 5 MG PO TABS
10.0000 mg | ORAL_TABLET | Freq: Once | ORAL | Status: AC
  Administered 2012-11-04: 10 mg via ORAL
  Filled 2012-11-04: qty 2

## 2012-11-04 NOTE — Progress Notes (Signed)
[name] [name]   DOB:[birth year]   UJ#:[id number]   NFA#:[id number]  Subjective: [name] [name] was admitted with intractable nausea and vomiting 10/29/2012, CT of the abdomen suggesting SBO. She has been npo, and has not vomited since 10 AM yesterday. She is weak and only getting OOB to BR; denies pain, fever, rash or bleeding; peritoneal catheter draining well. 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 Her husband [name] [name] runs a Psychologist, sport and exercise out of their own home. Daughter [name] [name], 54, currently lives at home with the patient. There are some issues relating to this daughter that the patient discussed briefly. [place]. [name] [name], 30, is an ED nurse at [employer]. The patient has no grandchildren. She is not a Advice worker.   ADVANCED DIRECTIVES: in place  GYNECOLOGIC HISTORY:  She had menarche age 28. First pregnancy to term was at age 37. She is GX P2. She became menopausal at the time of her chemotherapy for breast cancer. This was age 65. She never took hormone replacement therapy  FAMILY HISTORY: the patient's father died at the age of 25. The patient's mother died at the age of 6; she had a history of colon cancer and endometrial cancer. The patient has no sisters. She has one brother, with a history of colon cancer. There is no history of breast cancer in the family. The patient is scheduled for genetic testing later this month.  Objective: middle aged white woman examined in bed Filed Vitals:   11/04/12 0558  BP: 104/45  Pulse: 87  Temp: 98 F (36.7 C)  Resp: 16    Body mass index is 23.11 kg/(m^2).  Intake/Output Summary (Last 24 hours) at 11/04/12 0823 Last data filed at 11/03/12 1900  Gross per 24 hour  Intake    515 ml  Output      0 ml  Net    515 ml     Sclerae unicteric  Abdomen distended, drainage catheter in place  MSK no focal spinal tenderness, no peripheral edema  Neuro well oriented, positive affect  Breast exam:  deferred  CBG (last 3)   Recent Labs  11/02/12 1844 11/03/12 0802 11/04/12 0736  GLUCAP 135* 100* 48*     Labs:  Results for [name] [name] (MRN [id number]) as of 11/04/2012 08:13  Ref. Range 06/17/2012 10:12 07/06/2012 12:37 09/21/2012 11:50 10/06/2012 11:49 10/27/2012 10:51  CA 125 Latest Range: 0.0-30.2 U/mL 33.3 (H) 35.7 (H) 879.3 (H) 1066.3 (H) 599.7 (H)    Lab Results  Component Value Date   WBC 10.4 11/04/2012   HGB 8.7* 11/04/2012   HCT 25.8* 11/04/2012   MCV 89.0 11/04/2012   PLT 602* 11/04/2012   NEUTROABS 10.7* 11/02/2012    @LASTCHEMISTRY @  Urine Studies No results found for this basename: UACOL, UAPR, USPG, UPH, UTP, UGL, UKET, UBIL, UHGB, UNIT, UROB, ULEU, UEPI, UWBC, URBC, UBAC, CAST, CRYS, UCOM, BILUA,  in the last 72 hours  Basic Metabolic Panel:  Recent Labs Lab 10/29/12 1205 11/02/12 2020 11/03/12 0520 11/04/12 0600  NA 135 134* 135 135  K 4.5 4.4 4.0 3.6  CL 100 94* 100 104  CO2 25 28 26 24   GLUCOSE 89 122* 108* 56*  BUN 19 15 11 9   CREATININE 0.69 0.69 0.69 0.63  CALCIUM 8.7 9.0 7.6* 7.4*  MG  --  1.2*  --  1.5  PHOS  --  4.5  --   --    GFR Estimated Creatinine Clearance: 57.3  ml/min (by C-G formula based on Cr of 0.63). Liver Function Tests: No results found for this basename: AST, ALT, ALKPHOS, BILITOT, PROT, ALBUMIN,  in the last 168 hours No results found for this basename: LIPASE, AMYLASE,  in the last 168 hours No results found for this basename: AMMONIA,  in the last 168 hours Coagulation profile  Recent Labs Lab 10/29/12 1205  INR 1.03    CBC:  Recent Labs Lab 10/29/12 1205 11/02/12 2020 11/03/12 0520 11/04/12 0600  WBC 11.1* 14.0* 13.8* 10.4  NEUTROABS  --  10.7*  --   --   HGB 9.4* 10.3* 9.2* 8.7*  HCT 27.9* 31.1* 27.4* 25.8*  MCV 89.4 87.9 87.8 89.0  PLT 770* 888* 770* 602*   Cardiac Enzymes: No results found for this basename: CKTOTAL, CKMB, CKMBINDEX, TROPONINI,  in the last 168 hours BNP: No components found with  this basename: POCBNP,  CBG:  Recent Labs Lab 11/02/12 1844 11/03/12 0802 11/04/12 0736  GLUCAP 135* 100* 48*   D-Dimer No results found for this basename: DDIMER,  in the last 72 hours Hgb A1c No results found for this basename: HGBA1C,  in the last 72 hours Lipid Profile No results found for this basename: CHOL, HDL, LDLCALC, TRIG, CHOLHDL, LDLDIRECT,  in the last 72 hours Thyroid function studies No results found for this basename: TSH, T4TOTAL, FREET3, T3FREE, THYROIDAB,  in the last 72 hours Anemia work up No results found for this basename: VITAMINB12, FOLATE, FERRITIN, TIBC, IRON, RETICCTPCT,  in the last 72 hours Microbiology Recent Results (from the past 240 hour(s))  CULTURE, BLOOD (ROUTINE X 2)     Status: None   Collection Time    11/03/12  1:40 AM      Result Value Range Status   Specimen Description BLOOD RIGHT ARM   Final   Special Requests BOTTLES DRAWN AEROBIC AND ANAEROBIC 3CC   Final   Culture  Setup Time 11/03/2012 08:41   Final   Culture     Final   Value:        BLOOD CULTURE RECEIVED NO GROWTH TO DATE CULTURE WILL BE HELD FOR 5 DAYS BEFORE ISSUING A FINAL NEGATIVE REPORT   Report Status PENDING   Incomplete  CULTURE, BLOOD (ROUTINE X 2)     Status: None   Collection Time    11/03/12  1:45 AM      Result Value Range Status   Specimen Description BLOOD PORT   Final   Special Requests BOTTLES DRAWN AEROBIC AND ANAEROBIC 3CC   Final   Culture  Setup Time 11/03/2012 08:41   Final   Culture     Final   Value:        BLOOD CULTURE RECEIVED NO GROWTH TO DATE CULTURE WILL BE HELD FOR 5 DAYS BEFORE ISSUING A FINAL NEGATIVE REPORT   Report Status PENDING   Incomplete      Studies:  Ct Abdomen Pelvis W Contrast  11/02/2012  *RADIOLOGY REPORT*  Clinical Data: Abdominal pain.  CT ABDOMEN AND PELVIS WITH CONTRAST  Technique:  Multidetector CT imaging of the abdomen and pelvis was performed following the standard protocol during bolus administration of  intravenous contrast.  Contrast: 50mL OMNIPAQUE IOHEXOL 300 MG/ML  SOLN, OMNIPAQUE IOHEXOL 300 MG/ML  SOLN  Comparison: CT abdomen and pelvis 09/24/2012  Findings: The lung bases are clear.  Diffuse infiltration and nodularity in the omentum and mesentery as well as and peritoneal compartments consistent with peritoneal carcinomatosis and appearing  relatively stable since the previous study.  There is upper abdominal ascites which has improved since prior study.  The gallbladder is distended, similar to prior study. No bile duct dilatation.  The liver, spleen, pancreas, adrenal glands, kidneys, inferior vena cava, and retroperitoneal lymph nodes are unremarkable.  There is diffuse calcification of the abdominal aorta without aneurysm.  The stomach, the stomach is not abnormally distended.  Mild dilatation of the distal abdominal small bowel, new since previous study with a few air-fluid levels and some bowel wall thickening.  Terminal ileum is decompressed. Changes suggest obstruction, likely due to adhesions although a neoplastic involvement is not excluded.  Transition zone appears to be in the upper pelvis.  Right lower quadrant drainage catheter is in place.  No free air.  Pelvis:  Stool filled colon is not abnormally distended.  No colonic wall thickening.  Appendix is not appreciated.  Small amount of free fluid in the pelvis.  A heterogeneously enhancing nodules in the lower pelvis consistent with metastasis and stable since previous study.  Bladder wall is not thickened.  Degenerative changes in the lumbar spine.  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 Abdominal ascites is improved since previous study with drainage catheter in place.  Interval development of mild small bowel dilatation with air-fluid levels suggesting early or partial obstruction.   Original Report Authenticated By: Burman Nieves, M.D.     Assessment: 72 y.o. 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 [V323G (1196T>G)]  (1) status post left modified radical mastectomy October 1993 for a T2 N0 invasive ductal breast, estrogen and progesterone receptor positive cancer treated adjuvantly with MFL (methotrexate/fluorouracil/leucovorin) chemotherapy x6, completed April 1994, followed by 5 years of tamoxifen completed April 1999, with no evidence of recurrence  (2) now status post TAH BSO, omentectomy, and suboptimal debulking 10/29/2011 for a high-grade serous adenocarcinoma of the ovary, pT3c NX (Stage IIIC) , s/p eight cycles of carboplatin/paclitaxel completed 05/05/2012 (initial CA125 of 2050.7 normalized after cycle 5)  (3) recurrent disease documented by rising CEA 125 an abdominal CT scan 09/21/2012.  (4) COPD with ongoing tobacco abuse  (5) diabetes mellitus  (6) genetic testing for Lynch syndrome pending.  (7) status post hospitalization in July 2013 for stroke/small acute infarcts in the left sub insular white matter and coronal radiata.  (8) port in place  (9) left upper lobe 6 mm subpleural nodule, without associated hypermetabolic activity (likely well below the resolution of PET imaging) noted August 2013  (10) CTs obtained in February 2014 showed evidence of widespread intraperitoneal metastases and a moderate volume of malignant ascites (cytologically positive).  (11) being treated with Doxil on a Q. three-week basis, first dose given on 10/06/2012  (12) cholecystitis, with cholecystectomy pending  (13) recurring ascites, requiring therapeutic paracentesis, s/p placement of Aspira drain 10/29/2012  Plan: Vanity is currently day 9 cycle 2 liposomal doxorubicin. He CA 125 is showing early response. She is tolerating chemo well. Plan is to continue the Q3w treatments x8 or to maximal response  Hopefully current SBO (which could be due to tumor or scarring) will resolve with conservative management; with no vomiting in past 24 hrs  hopefully she will be able to tolerate clear liquids with further advance as tolerated. Her next appointment with [place] (she was scheduled to see me today) will be 4/15 and she will receieve her 3d cycle of chemotherapy then.  I gave Dimitria the CA 125 results, and encouraged her  to get OOB as much as possible and ambulate with assistance. Greatly appreciate your help to this patient. Will follow with you.   Darshawn Boateng C 11/04/2012

## 2012-11-04 NOTE — Progress Notes (Signed)
[facility]  Patient Status: Active (receiving services up to time of hospitalization)  [facility] is providing the following services: RN and PT  If patient discharges after hours, please call [phone].   Lanae Crumbly 11/04/2012, 12:15 PM

## 2012-11-04 NOTE — Progress Notes (Signed)
Patient ID: [name] [name], female   DOB: Apr 08, 1941, 72 y.o.   MRN: [id number]  [facility] PROGRESS NOTE  KELISHA DALL XBJ:[id number] DOB: [birth year] DOA: 11/02/2012 PCP: Laurette Schimke, MD  Brief HPI:  Pt is a 72 y.o. 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  In the ED, she was found to have early or partial SBO on CT abdomen. 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 Abdominal ascites is improved since previous study with drainage catheter in place.  Interval development of mild small bowel dilatation with air-fluid levels suggesting early or partial obstruction.     Antibiotics:  None  Code Status: Full Family Communication: Pt at bedside Disposition Plan: Home when medically stable  HPI/Subjective: No events overnight.   Objective: Filed Vitals:   11/04/12 0022 11/04/12 0558 11/04/12 0758 11/04/12 1136  BP: 105/52 104/45    Pulse: 93 87    Temp:  98 F (36.7 C)    TempSrc:  Oral    Resp:  16    Height:      Weight:      SpO2:  93% 95% 95%    Intake/Output Summary (Last 24 hours) at 11/04/12 1244 Last data filed at 11/03/12 1900  Gross per 24 hour  Intake    515 ml  Output      0 ml  Net    515 ml    Exam:   General:  Pt is alert,  follows commands appropriately, not in acute distress  Cardiovascular: Regular rate and rhythm, S1/S2, no murmurs, no rubs, no gallops  Respiratory: Clear to auscultation bilaterally, no wheezing, no crackles, no rhonchi  Abdomen: Soft, non tender, non distended, bowel sounds present, no guarding  Extremities: No edema, pulses DP and PT palpable bilaterally  Neuro: Grossly nonfocal  Data Reviewed: Basic Metabolic Panel:  Recent Labs Lab 10/29/12 1205 11/02/12 2020 11/03/12 0520 11/04/12 0600  NA 135 134* 135 135  K 4.5 4.4 4.0 3.6  CL 100 94* 100 104  CO2 25 28 26 24   GLUCOSE 89 122* 108* 56*  BUN 19 15 11 9   CREATININE 0.69 0.69 0.69 0.63  CALCIUM 8.7 9.0 7.6* 7.4*  MG  --  1.2*  --  1.5  PHOS  --  4.5  --   --    CBC:  Recent Labs Lab 10/29/12 1205 11/02/12 2020 11/03/12 0520 11/04/12 0600  WBC 11.1* 14.0* 13.8* 10.4  NEUTROABS  --  10.7*  --   --   HGB 9.4* 10.3* 9.2* 8.7*  HCT 27.9* 31.1* 27.4* 25.8*  MCV 89.4 87.9 87.8 89.0  PLT 770* 888* 770* 602*   CBG:  Recent Labs Lab 11/04/12 0736 11/04/12 0811 11/04/12 0850 11/04/12 1040 11/04/12 1152  GLUCAP 48* 108* 85 64* 116*  Recent Results (from the past 240 hour(s))  CULTURE, BLOOD (ROUTINE X 2)     Status: None   Collection Time    11/03/12  1:40 AM      Result Value Range Status   Specimen Description BLOOD RIGHT ARM   Final   Special Requests BOTTLES DRAWN AEROBIC AND ANAEROBIC 3CC   Final   Culture  Setup Time 11/03/2012 08:41   Final   Culture     Final   Value:        BLOOD CULTURE RECEIVED NO GROWTH TO DATE CULTURE WILL BE HELD FOR 5 DAYS BEFORE ISSUING A FINAL NEGATIVE REPORT   Report Status PENDING   Incomplete  CULTURE, BLOOD (ROUTINE X 2)     Status: None   Collection Time    11/03/12  1:45 AM      Result Value Range Status   Specimen Description BLOOD PORT   Final   Special Requests BOTTLES DRAWN AEROBIC AND ANAEROBIC 3CC   Final   Culture  Setup Time 11/03/2012 08:41   Final    Culture     Final   Value:        BLOOD CULTURE RECEIVED NO GROWTH TO DATE CULTURE WILL BE HELD FOR 5 DAYS BEFORE ISSUING A FINAL NEGATIVE REPORT   Report Status PENDING   Incomplete     Scheduled Meds: . ipratropium  0.5 mg Nebulization Q4H WA   And  . albuterol  2.5 mg Nebulization Q4H WA  . antiseptic oral rinse  15 mL Mouth Rinse q12n4p  . atenolol  12.5 mg Oral BID  . budesonide-formoterol  2 puff Inhalation BID  . buPROPion  150 mg Oral BID  . busPIRone  15 mg Oral BID  . chlorhexidine  15 mL Mouth Rinse BID  . clopidogrel  75 mg Oral Q breakfast  . heparin  5,000 Units Subcutaneous Q8H  . pantoprazole  80 mg Oral Daily  . sertraline  100 mg Oral QAC breakfast  . simvastatin  20 mg Oral q1800   Continuous Infusions: . dextrose 5 % and 0.9% NaCl 50 mL/hr at 11/04/12 0940     Debbora Presto, MD  [facility] Pager [phone]  If 7PM-7AM, please contact night-coverage [email or web address] Password [email or web address] 11/04/2012, 12:44 PM   LOS: 2 days

## 2012-11-04 NOTE — Care Management (Addendum)
CARE MANAGEMENT NOTE 11/04/2012  Patient:  [name] [name], [name] [name]   Account Number:  [id number]  Date Initiated:  11/03/2012  Documentation initiated by:  Virda Betters  Subjective/Objective Assessment:   72 yo female admitted with partial SBO. PTA pt independent.     Action/Plan:   Home when stable   Anticipated DC Date:     Anticipated DC Plan:  HOME W HOME HEALTH SERVICES         Choice offered to / List presented to:  NA        HH arranged  HH-1 RN  HH-2 PT      [facility] agency  [facility]   Status of service:  Completed, signed off Medicare Important Message given?   (If response is "NO", the following Medicare IM given date fields will be blank) Date Medicare IM given:   Date Additional Medicare IM given:    Discharge Disposition:    Per UR Regulation:  Reviewed for med. necessity/level of care/duration of stay  If discussed at Long Length of Stay Meetings, dates discussed:    Comments:  11/04/12 1315 Tipton Ballow,RN,BSN [phone] Cm spoke with patient with spouse present in hallway concerning discharge planning. Per pt currently active with [facility] for HHRN/HHPT. Pt states [facility] to continue services upon discharge. MD will need to order resumption of care upon discharge. Pt's spouse to assist in home care.   11/03/12  1525 Chart reviewed. UR complete. Will follow for any further discharge needs.

## 2012-11-04 NOTE — Progress Notes (Signed)
Pt B/P was 104/42 at bedtime. Oncall Md was notified an order for 500 cc bolus of normal saline was ordered. B/P RECHECK 105/52. MD was notified.

## 2012-11-05 LAB — GLUCOSE, CAPILLARY: Glucose-Capillary: 121 mg/dL — ABNORMAL HIGH (ref 70–99)

## 2012-11-05 MED ORDER — DIAZEPAM 5 MG PO TABS
10.0000 mg | ORAL_TABLET | Freq: Every evening | ORAL | Status: DC | PRN
  Administered 2012-11-06: 10 mg via ORAL
  Filled 2012-11-05 (×2): qty 2

## 2012-11-05 NOTE — Progress Notes (Signed)
Patient admitted to hospital. IR contacted in regards to drainage supplies.  Patient had brought dressing and drain bag from home.  I went up to evaluate what was needed.  I changed the dressing and hooked up drainage bag to the Aspira catheter.  A total of 2100 cc peritoneal fluid drained without discomfort.  Patient felt much better.  Floor RN will order additional bags and orders for prn drainage.

## 2012-11-05 NOTE — Progress Notes (Signed)
Patient ID: [name] [name], female   DOB: [birth year], 72 y.o.   MRN: [id number]  [facility] PROGRESS NOTE  STEPHANIEMARIE STOFFEL WUJ:[id number] DOB: May 22, 1941 DOA: 11/02/2012 PCP: Laurette Schimke, MD  Brief HPI:  Pt is a 73 y.o. 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  In the ED, she was found to have early or partial SBO on CT abdomen. Hospitalist has been asked to admit for further evaluation.  Principal Problem:  Partial small bowel obstruction  - clinically improving, appreciate oncology assistance  - continue clear liquids today  - continue supportive care, analgesia and antiemetics as needed  Active Problems:  Ascites, malignant  - per oncology, appears to be improving based on CT scan  Ovarian cancer  - per oncology  Consultants:  Oncology  Procedures/Studies:  Ct Abdomen Pelvis W Contrast  11/02/2012  Diffuse peritoneal carcinomatosis similar to previous study. Abdominal ascites is improved since previous study with drainage catheter in place. Interval development of mild small bowel dilatation with air-fluid levels suggesting early or partial obstruction.  Antibiotics:  None Code Status: Full  Family Communication: Pt at bedside  Disposition Plan: Home when medically stable   HPI/Subjective: No events overnight.   Objective: Filed Vitals:   11/05/12 0801 11/05/12 0813 11/05/12 1213 11/05/12 1344  BP:    104/46  Pulse:    98  Temp:    98.6 F (37 C)  TempSrc:    Oral  Resp:    18  Height:      Weight:      SpO2: 88% 98% 92% 94%    Intake/Output Summary (Last 24 hours) at 11/05/12 1654 Last data filed at 11/05/12 1236  Gross per 24 hour  Intake   1080 ml  Output     75 ml  Net   1005 ml    Exam:   General:  Pt is alert, follows commands appropriately, not in acute distress  Cardiovascular: Regular  rate and rhythm, S1/S2, no murmurs, no rubs, no gallops  Respiratory: Clear to auscultation bilaterally, no wheezing, no crackles, no rhonchi  Abdomen: Soft, non tender, non distended, bowel sounds present, no guarding  Extremities: No edema, pulses DP and PT palpable bilaterally  Neuro: Grossly nonfocal  Data Reviewed: Basic Metabolic Panel:  Recent Labs Lab 11/02/12 2020 11/03/12 0520 11/04/12 0600  NA 134* 135 135  K 4.4 4.0 3.6  CL 94* 100 104  CO2 28 26 24   GLUCOSE 122* 108* 56*  BUN 15 11 9   CREATININE 0.69 0.69 0.63  CALCIUM 9.0 7.6* 7.4*  MG 1.2*  --  1.5  PHOS 4.5  --   --    Liver Function Tests: No results found for this basename: AST, ALT, ALKPHOS, BILITOT, PROT, ALBUMIN,  in the last 168 hours No results found for this basename: LIPASE, AMYLASE,  in the last 168 hours No results found for this basename: AMMONIA,  in the last 168 hours CBC:  Recent Labs Lab 11/02/12 2020 11/03/12 0520 11/04/12 0600  WBC 14.0* 13.8* 10.4  NEUTROABS 10.7*  --   --   HGB 10.3* 9.2* 8.7*  HCT 31.1* 27.4* 25.8*  MCV 87.9 87.8 89.0  PLT 888* 770* 602*   Cardiac Enzymes: No results found for this basename: CKTOTAL, CKMB, CKMBINDEX, TROPONINI,  in the last 168 hours BNP: No components found with this  basename: POCBNP,  CBG:  Recent Labs Lab 11/04/12 0811 11/04/12 0850 11/04/12 1040 11/04/12 1152 11/05/12 0805  GLUCAP 108* 85 64* 116* 121*    Recent Results (from the past 240 hour(s))  CULTURE, BLOOD (ROUTINE X 2)     Status: None   Collection Time    11/03/12  1:40 AM      Result Value Range Status   Specimen Description BLOOD RIGHT ARM   Final   Special Requests BOTTLES DRAWN AEROBIC AND ANAEROBIC 3CC   Final   Culture  Setup Time 11/03/2012 08:41   Final   Culture     Final   Value:        BLOOD CULTURE RECEIVED NO GROWTH TO DATE CULTURE WILL BE HELD FOR 5 DAYS BEFORE ISSUING A FINAL NEGATIVE REPORT   Report Status PENDING   Incomplete  CULTURE, BLOOD  (ROUTINE X 2)     Status: None   Collection Time    11/03/12  1:45 AM      Result Value Range Status   Specimen Description BLOOD PORT   Final   Special Requests BOTTLES DRAWN AEROBIC AND ANAEROBIC 3CC   Final   Culture  Setup Time 11/03/2012 08:41   Final   Culture     Final   Value:        BLOOD CULTURE RECEIVED NO GROWTH TO DATE CULTURE WILL BE HELD FOR 5 DAYS BEFORE ISSUING A FINAL NEGATIVE REPORT   Report Status PENDING   Incomplete     Scheduled Meds: . ipratropium  0.5 mg Nebulization QID   And  . albuterol  2.5 mg Nebulization QID  . antiseptic oral rinse  15 mL Mouth Rinse q12n4p  . atenolol  12.5 mg Oral BID  . budesonide-formoterol  2 puff Inhalation BID  . buPROPion  150 mg Oral BID  . busPIRone  15 mg Oral BID  . chlorhexidine  15 mL Mouth Rinse BID  . clopidogrel  75 mg Oral Q breakfast  . heparin  5,000 Units Subcutaneous Q8H  . pantoprazole  80 mg Oral Daily  . sertraline  100 mg Oral QAC breakfast  . simvastatin  20 mg Oral q1800   Continuous Infusions: . dextrose 5 % and 0.9% NaCl 50 mL/hr at 11/05/12 0506     Debbora Presto, MD  [facility] Pager [phone]  If 7PM-7AM, please contact night-coverage [email or web address] Password [email or web address] 11/05/2012, 4:54 PM   LOS: 3 days

## 2012-11-06 DIAGNOSIS — R188 Other ascites: Secondary | ICD-10-CM

## 2012-11-06 LAB — GLUCOSE, CAPILLARY: Glucose-Capillary: 98 mg/dL (ref 70–99)

## 2012-11-06 LAB — CBC
MCHC: 33.5 g/dL (ref 30.0–36.0)
Platelets: 640 10*3/uL — ABNORMAL HIGH (ref 150–400)
RDW: 14.7 % (ref 11.5–15.5)
WBC: 9.3 10*3/uL (ref 4.0–10.5)

## 2012-11-06 LAB — BASIC METABOLIC PANEL
Chloride: 104 mEq/L (ref 96–112)
GFR calc Af Amer: >90 mL/min (ref >90)
GFR calc non Af Amer: 90 mL/min — ABNORMAL LOW (ref >90)
Potassium: 3.4 mEq/L — ABNORMAL LOW (ref 3.5–5.1)

## 2012-11-06 MED ORDER — ENOXAPARIN SODIUM 40 MG/0.4ML ~~LOC~~ SOLN
40.0000 mg | SUBCUTANEOUS | Status: DC
  Administered 2012-11-06 – 2012-11-13 (×7): 40 mg via SUBCUTANEOUS
  Filled 2012-11-06 (×8): qty 0.4

## 2012-11-06 MED ORDER — POTASSIUM CHLORIDE CRYS ER 20 MEQ PO TBCR
20.0000 meq | EXTENDED_RELEASE_TABLET | Freq: Once | ORAL | Status: AC
  Administered 2012-11-06: 20 meq via ORAL
  Filled 2012-11-06: qty 1

## 2012-11-06 NOTE — Progress Notes (Signed)
[name] [name]   DOB:[birth year]   ZO#:[id number]   JWJ#:[id number]  Subjective: Kerstin tells me she was very weak yesterday and did not get around much; had some pain and nausea, now well-controlled; had one "long liquid" bowel movement, not bloody; ate some of the clear liquids but enjoyed 2 bites of her husband's macaroni and cheese last night "and kept that down." Feels stronger today and hopes to walk some; doesn't feel she could make it at home "like this." Drained 1.5 L ascites yesterday. 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 Her husband [name] runs a Psych[employer], sport and exercise out of their own home. Daughter [name], 46, currently lives at home with the patient. There are some issues relating to this daughter that the patient discussed briefly. Dauhter. [name], 30, is an ED nurse at [employer]. The patient has no grandchildren. She is not a Advice worker.   ADVANCED DIRECTIVES: in place  GYNECOLOGIC HISTORY:  She had menarche age 53. First pregnancy to term was at age 72. She is GX P2. She became menopausal at the time of her chemotherapy for breast cancer. This was age 72. She never took hormone replacement therapy  FAMILY HISTORY: the patient's father died at the age of 29. The patient's mother died at the age of 93; she had a history of colon cancer and endometrial cancer. The patient has no sisters. She has one brother, with a history of colon cancer. There is no history of breast cancer in the family. The patient is scheduled for genetic testing later this month.  Objective: middle aged white woman examined sitting up in bed Filed Vitals:   11/06/12 0411  BP: 106/52  Pulse: 106  Temp: 98.9 F (37.2 C)  Resp: 18    Body mass index is 23.11 kg/(m^2).  Intake/Output Summary (Last 24 hours) at 11/06/12 0756 Last data filed at 11/06/12 0630  Gross per 24 hour  Intake 3321.67 ml  Output      0 ml  Net 3321.67 ml     Sclerae  unicteric  Abdomen distended but soft and NT, decreased BS, drainage catheter in place  MSK  no peripheral edema  Neuro well oriented, positive affect  Breast exam: deferred  CBG (last 3)   Recent Labs  11/04/12 1040 11/04/12 1152 11/05/12 0805  GLUCAP 64* 116* 121*     Labs:  Results for JALACIA, MATTILA (MRN [id number]) as of 11/04/2012 08:13  Ref. Range 06/17/2012 10:12 07/06/2012 12:37 09/21/2012 11:50 10/06/2012 11:49 10/27/2012 10:51  CA 125 Latest Range: 0.0-30.2 U/mL 33.3 (H) 35.7 (H) 879.3 (H) 1066.3 (H) 599.7 (H)    Lab Results  Component Value Date   WBC 9.3 11/06/2012   HGB 8.8* 11/06/2012   HCT 26.3* 11/06/2012   MCV 88.6 11/06/2012   PLT 640* 11/06/2012   NEUTROABS 10.7* 11/02/2012    @LASTCHEMISTRY @  Urine Studies No results found for this basename: UACOL, UAPR, USPG, UPH, UTP, UGL, UKET, UBIL, UHGB, UNIT, UROB, ULEU, UEPI, UWBC, URBC, UBAC, CAST, CRYS, UCOM, BILUA,  in the last 72 hours  Basic Metabolic Panel:  Recent Labs Lab 11/02/12 2020 11/03/12 0520 11/04/12 0600 11/06/12 0515  NA 134* 135 135 137  K 4.4 4.0 3.6 3.4*  CL 94* 100 104 104  CO2 28 26 24 25   GLUCOSE 122* 108* 56* 106*  BUN 15 11 9  5*  CREATININE 0.69 0.69 0.63 0.62  CALCIUM 9.0 7.6* 7.4* 7.8*  MG  1.2*  --  1.5  --   PHOS 4.5  --   --   --    GFR Estimated Creatinine Clearance: 57.3 ml/min (by C-G formula based on Cr of 0.62). Liver Function Tests: No results found for this basename: AST, ALT, ALKPHOS, BILITOT, PROT, ALBUMIN,  in the last 168 hours No results found for this basename: LIPASE, AMYLASE,  in the last 168 hours No results found for this basename: AMMONIA,  in the last 168 hours Coagulation profile No results found for this basename: INR, PROTIME,  in the last 168 hours  CBC:  Recent Labs Lab 11/02/12 2020 11/03/12 0520 11/04/12 0600 11/06/12 0515  WBC 14.0* 13.8* 10.4 9.3  NEUTROABS 10.7*  --   --   --   HGB 10.3* 9.2* 8.7* 8.8*  HCT 31.1* 27.4* 25.8* 26.3*  MCV  87.9 87.8 89.0 88.6  PLT 888* 770* 602* 640*   Cardiac Enzymes: No results found for this basename: CKTOTAL, CKMB, CKMBINDEX, TROPONINI,  in the last 168 hours BNP: No components found with this basename: POCBNP,  CBG:  Recent Labs Lab 11/04/12 0811 11/04/12 0850 11/04/12 1040 11/04/12 1152 11/05/12 0805  GLUCAP 108* 85 64* 116* 121*   D-Dimer No results found for this basename: DDIMER,  in the last 72 hours Hgb A1c No results found for this basename: HGBA1C,  in the last 72 hours Lipid Profile No results found for this basename: CHOL, HDL, LDLCALC, TRIG, CHOLHDL, LDLDIRECT,  in the last 72 hours Thyroid function studies No results found for this basename: TSH, T4TOTAL, FREET3, T3FREE, THYROIDAB,  in the last 72 hours Anemia work up No results found for this basename: VITAMINB12, FOLATE, FERRITIN, TIBC, IRON, RETICCTPCT,  in the last 72 hours Microbiology Recent Results (from the past 240 hour(s))  CULTURE, BLOOD (ROUTINE X 2)     Status: None   Collection Time    11/03/12  1:40 AM      Result Value Range Status   Specimen Description BLOOD RIGHT ARM   Final   Special Requests BOTTLES DRAWN AEROBIC AND ANAEROBIC 3CC   Final   Culture  Setup Time 11/03/2012 08:41   Final   Culture     Final   Value:        BLOOD CULTURE RECEIVED NO GROWTH TO DATE CULTURE WILL BE HELD FOR 5 DAYS BEFORE ISSUING A FINAL NEGATIVE REPORT   Report Status PENDING   Incomplete  CULTURE, BLOOD (ROUTINE X 2)     Status: None   Collection Time    11/03/12  1:45 AM      Result Value Range Status   Specimen Description BLOOD PORT   Final   Special Requests BOTTLES DRAWN AEROBIC AND ANAEROBIC 3CC   Final   Culture  Setup Time 11/03/2012 08:41   Final   Culture     Final   Value:        BLOOD CULTURE RECEIVED NO GROWTH TO DATE CULTURE WILL BE HELD FOR 5 DAYS BEFORE ISSUING A FINAL NEGATIVE REPORT   Report Status PENDING   Incomplete      Studies:  Dg Abd 2 Views  11/04/2012  *RADIOLOGY REPORT*   Clinical Data: Nausea vomiting.  Follow up small bowel obstruction  ABDOMEN - 2 VIEW  Comparison: CT 11/02/2012  Findings: Peritoneal catheter from right-sided approach a crosses the midline into the left pelvis.  Small bowel loops are mildly dilated and are unchanged from the recent CT.  No free  air is identified.  Colon is decompressed and contains  contrast from prior CT.  IMPRESSION: Mild small bowel dilatation is unchanged and may relate to small bowel obstruction.  No free air.   Original Report Authenticated By: Janeece Riggers, M.D.     Assessment: 72 y.o. 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 [V323G (1196T>G)] , history of breast cancer (resolved) and ovarian cancer (active), admitted with partial SBO (1) status post left modified radical mastectomy October 1993 for a T2 N0 invasive ductal breast, estrogen and progesterone receptor positive cancer treated adjuvantly with MFL (methotrexate/fluorouracil/leucovorin) chemotherapy x6, completed April 1994, followed by 5 years of tamoxifen completed April 1999, with no evidence of recurrence  (2) now status post TAH BSO, omentectomy, and suboptimal debulking 10/29/2011 for a high-grade serous adenocarcinoma of the ovary, pT3c NX (Stage IIIC) , s/p eight cycles of carboplatin/paclitaxel completed 05/05/2012 (initial CA125 of 2050.7 normalized after cycle 5)  (3) recurrent disease documented by rising CEA 125; an abdominal CT scan 09/21/2012 showed  widespread intraperitoneal metastases and a moderate volume of malignant ascites (cytologically positive).  Has received 2 cycles of liposomal doxorubicin, with CA-125 evidence of early response (4) COPD with ongoing tobacco abuse  (5) diabetes mellitus  (6) genetic testing for Lynch syndrome pending.  (7) status post hospitalization in July 2013 for stroke/small acute infarcts in the left sub insular white matter and coronal radiata.  (8) port in place  (9) left  upper lobe 6 mm subpleural nodule, without associated hypermetabolic activity (likely well below the resolution of PET imaging) noted August 2013  (10) cholecystitis, with cholecystectomy pending significant improvement in patient's functional status (11) recurring ascites, requiring therapeutic paracentesis, s/p placement of Aspira drain 10/29/2012 (12) partial SBO: conservative management  Plan: Zykiria is currently day 11 cycle 2 liposomal doxorubicin. Her CA 125 is showing early response. She is tolerating chemo well. Next treatmetn due 11/17/2012  She seems to be tolerating some po's but still very limited intake; if no significant resolution over weekend may need TNA temporarily. Encouraged pt to get OOB and ambulate w assistance.  Otherwise nothing to add to hospitalist's excellent care of this patient. Will follow with you.   Jameal Razzano C 11/06/2012

## 2012-11-06 NOTE — Progress Notes (Signed)
Patient ID: [name] [name], female   DOB: 12/14/1940, 72 y.o.   MRN: [id number]  [facility] PROGRESS NOTE  [name] [name] YQM:[id number] DOB: September 17, 1940 DOA: 11/02/2012 PCP: Laurette Schimke, MD   Brief HPI:  Pt is a 72 y.o. 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   In the ED, she was found to have early or partial SBO on CT abdomen. Hospitalist has been asked to admit for further evaluation.   Principal Problem:  Partial small bowel obstruction  - clinically improving, appreciate oncology assistance  - pt would like to advance diet to soft, discussed with her plan in detail, we can try today and if pt not tolerating will change back to liquid diet  - continue supportive care, analgesia and antiemetics as needed  Active Problems:  Ascites, malignant  - per oncology, appears to be improving based on CT scan  - 1.5 L removed yesterday  Ovarian cancer  - per oncology, appreciate assistance Anemia of chronic disease - Hg and Hct remains stable over 24 hours - CBC in AM Hypokalemia  - will supplement today and repeat BMP in AM Diarrhea - will check C, Diff by PCR   Consultants:  Oncology  Procedures/Studies:  Ct Abdomen Pelvis W Contrast  11/02/2012  Diffuse peritoneal carcinomatosis similar to previous study. Abdominal ascites is improved since previous study with drainage catheter in place. Interval development of mild small bowel dilatation with air-fluid levels suggesting early or partial obstruction.  Antibiotics:  None  Code Status: Full  Family Communication: Pt at bedside  Disposition Plan: Home when medically stable, PT evaluation prior to discharge    HPI/Subjective: No events overnight.   Objective: Filed Vitals:   11/05/12 2052 11/06/12 0411 11/06/12 0859 11/06/12 0945  BP: 109/49 106/52  90/48  Pulse:  104 106  105  Temp: 99 F (37.2 C) 98.9 F (37.2 C)    TempSrc: Oral Oral    Resp: 16 18    Height:      Weight:      SpO2: 95% 93% 92%     Intake/Output Summary (Last 24 hours) at 11/06/12 1042 Last data filed at 11/06/12 0902  Gross per 24 hour  Intake 3321.67 ml  Output      0 ml  Net 3321.67 ml    Exam:   General:  Pt is alert, follows commands appropriately, not in acute distress  Cardiovascular: Regular rate and rhythm, S1/S2, no murmurs, no rubs, no gallops  Respiratory: Clear to auscultation bilaterally, no wheezing, no crackles, no rhonchi, decreased breath sounds at bases   Abdomen: Soft, non tender, non distended, bowel sounds present, no guarding  Extremities: No edema, pulses DP and PT palpable bilaterally  Neuro: Grossly nonfocal  Data Reviewed: Basic Metabolic Panel:  Recent Labs Lab 11/02/12 2020 11/03/12 0520 11/04/12 0600 11/06/12 0515  NA 134* 135 135 137  K 4.4 4.0 3.6 3.4*  CL 94* 100 104 104  CO2 28 26 24 25   GLUCOSE 122* 108* 56* 106*  BUN 15 11 9  5*  CREATININE 0.69 0.69 0.63 0.62  CALCIUM 9.0 7.6* 7.4* 7.8*  MG 1.2*  --  1.5  --   PHOS 4.5  --   --   --    CBC:  Recent Labs Lab 11/02/12 2020 11/03/12 0520 11/04/12 0600 11/06/12 0515  WBC 14.0* 13.8*  10.4 9.3  NEUTROABS 10.7*  --   --   --   HGB 10.3* 9.2* 8.7* 8.8*  HCT 31.1* 27.4* 25.8* 26.3*  MCV 87.9 87.8 89.0 88.6  PLT 888* 770* 602* 640*   CBG:  Recent Labs Lab 11/04/12 0850 11/04/12 1040 11/04/12 1152 11/05/12 0805 11/06/12 0748  GLUCAP 85 64* 116* 121* 98    Recent Results (from the past 240 hour(s))  CULTURE, BLOOD (ROUTINE X 2)     Status: None   Collection Time    11/03/12  1:40 AM      Result Value Range Status   Specimen Description BLOOD RIGHT ARM   Final   Special Requests BOTTLES DRAWN AEROBIC AND ANAEROBIC 3CC   Final   Culture  Setup Time 11/03/2012 08:41   Final   Culture     Final   Value:        BLOOD CULTURE RECEIVED NO GROWTH TO  DATE CULTURE WILL BE HELD FOR 5 DAYS BEFORE ISSUING A FINAL NEGATIVE REPORT   Report Status PENDING   Incomplete  CULTURE, BLOOD (ROUTINE X 2)     Status: None   Collection Time    11/03/12  1:45 AM      Result Value Range Status   Specimen Description BLOOD PORT   Final   Special Requests BOTTLES DRAWN AEROBIC AND ANAEROBIC 3CC   Final   Culture  Setup Time 11/03/2012 08:41   Final   Culture     Final   Value:        BLOOD CULTURE RECEIVED NO GROWTH TO DATE CULTURE WILL BE HELD FOR 5 DAYS BEFORE ISSUING A FINAL NEGATIVE REPORT   Report Status PENDING   Incomplete     Scheduled Meds: . ipratropium  0.5 mg Nebulization QID   And  . albuterol  2.5 mg Nebulization QID  . antiseptic oral rinse  15 mL Mouth Rinse q12n4p  . atenolol  12.5 mg Oral BID  . budesonide-formoterol  2 puff Inhalation BID  . buPROPion  150 mg Oral BID  . busPIRone  15 mg Oral BID  . chlorhexidine  15 mL Mouth Rinse BID  . clopidogrel  75 mg Oral Q breakfast  . heparin  5,000 Units Subcutaneous Q8H  . pantoprazole  80 mg Oral Daily  . sertraline  100 mg Oral QAC breakfast  . simvastatin  20 mg Oral q1800   Continuous Infusions: . dextrose 5 % and 0.9% NaCl 50 mL/hr at 11/06/12 0141   Debbora Presto, MD  [facility] Pager [phone]  If 7PM-7AM, please contact night-coverage [email or web address] Password [email or web address] 11/06/2012, 10:42 AM   LOS: 4 days

## 2012-11-07 ENCOUNTER — Inpatient Hospital Stay (HOSPITAL_COMMUNITY): Payer: Medicare Other

## 2012-11-07 LAB — CBC
MCH: 30.4 pg (ref 26.0–34.0)
Platelets: 652 10*3/uL — ABNORMAL HIGH (ref 150–400)
RBC: 2.96 MIL/uL — ABNORMAL LOW (ref 3.87–5.11)
RDW: 14.8 % (ref 11.5–15.5)
WBC: 8.2 10*3/uL (ref 4.0–10.5)

## 2012-11-07 LAB — GLUCOSE, CAPILLARY: Glucose-Capillary: 97 mg/dL (ref 70–99)

## 2012-11-07 LAB — BASIC METABOLIC PANEL
CO2: 25 mEq/L (ref 19–32)
Calcium: 7.8 mg/dL — ABNORMAL LOW (ref 8.4–10.5)
Chloride: 105 mEq/L (ref 96–112)
GFR calc Af Amer: >90 mL/min (ref >90)
Sodium: 138 mEq/L (ref 135–145)

## 2012-11-07 MED ORDER — SODIUM CHLORIDE 0.9 % IV SOLN
INTRAVENOUS | Status: DC
  Administered 2012-11-07: 21:00:00 via INTRAVENOUS

## 2012-11-07 MED ORDER — POTASSIUM CHLORIDE CRYS ER 20 MEQ PO TBCR
20.0000 meq | EXTENDED_RELEASE_TABLET | Freq: Once | ORAL | Status: AC
  Administered 2012-11-07: 20 meq via ORAL
  Filled 2012-11-07: qty 1

## 2012-11-07 MED ORDER — PROCHLORPERAZINE MALEATE 10 MG PO TABS
10.0000 mg | ORAL_TABLET | Freq: Four times a day (QID) | ORAL | Status: DC | PRN
  Administered 2012-11-07: 10 mg via ORAL
  Filled 2012-11-07: qty 1

## 2012-11-07 MED ORDER — ONDANSETRON HCL 4 MG/2ML IJ SOLN
4.0000 mg | Freq: Four times a day (QID) | INTRAMUSCULAR | Status: DC | PRN
  Administered 2012-11-07: 4 mg via INTRAVENOUS
  Filled 2012-11-07: qty 2

## 2012-11-07 MED ORDER — ONDANSETRON HCL 4 MG/2ML IJ SOLN
4.0000 mg | INTRAMUSCULAR | Status: DC | PRN
  Administered 2012-11-07 – 2012-11-08 (×3): 4 mg via INTRAVENOUS
  Filled 2012-11-07 (×3): qty 2

## 2012-11-07 MED ORDER — DIAZEPAM 5 MG/ML IJ SOLN
5.0000 mg | Freq: Once | INTRAMUSCULAR | Status: AC
  Administered 2012-11-07: 5 mg via INTRAVENOUS
  Filled 2012-11-07: qty 2

## 2012-11-07 MED ORDER — OXYCODONE HCL 5 MG PO TABS
5.0000 mg | ORAL_TABLET | Freq: Four times a day (QID) | ORAL | Status: DC | PRN
  Administered 2012-11-07: 5 mg via ORAL
  Filled 2012-11-07: qty 1

## 2012-11-07 NOTE — Progress Notes (Addendum)
Patient ID: [name] [name], female   DOB: 1941-05-04, 72 y.o.   MRN: [id number] [facility] PROGRESS NOTE  [name] [name] WUJ:[id number] DOB: 10/29/1940 DOA: 11/02/2012 PCP: Laurette Schimke, MD  Brief HPI:  Pt is a 72 y.o. 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   In the ED, she was found to have early or partial SBO on CT abdomen. Hospitalist has been asked to admit for further evaluation.   Principal Problem:  Partial small bowel obstruction  - clinically improving, appreciate oncology assistance  - diet advanced to soft solids and pt reports tolerating well, denies nausea or vomiting this AM - continue supportive care, analgesia and antiemetics as needed  Active Problems:  Ascites, malignant  - per oncology, appears to be improving based on CT scan  - 1.5 L removed 04/03 Ovarian cancer  - per oncology, appreciate assistance  Anemia of chronic disease  - Hg and Hct remains stable over 48 hours  - CBC in AM  Hypokalemia  - mild, will supplement again today and repeat BMP in AM  Diarrhea  - C. Diff by PCR pending  Tachycardia  - continue Atenolol  Consultants:  Oncology  Procedures/Studies:  Ct Abdomen Pelvis W Contrast  11/02/2012  Diffuse peritoneal carcinomatosis similar to previous study. Abdominal ascites is improved since previous study with drainage catheter in place. Interval development of mild small bowel dilatation with air-fluid levels suggesting early or partial obstruction.  Antibiotics:  None  Code Status: Full  Family Communication: Pt at bedside  Disposition Plan: Home when medically stable, PT evaluation prior to discharge   HPI/Subjective: No events overnight.   Objective: Filed Vitals:   11/06/12 2117 11/06/12 2308 11/07/12 0630 11/07/12 0814  BP: 114/39 108/57 112/59   Pulse: 106 108 89    Temp: 99.3 F (37.4 C) 99.1 F (37.3 C) 99.3 F (37.4 C)   TempSrc: Oral Oral Oral   Resp: 16  16   Height:      Weight:      SpO2: 91%  93% 91%    Intake/Output Summary (Last 24 hours) at 11/07/12 1102 Last data filed at 11/07/12 0600  Gross per 24 hour  Intake   1305 ml  Output      0 ml  Net   1305 ml    Exam:   General:  Pt is alert, follows commands appropriately, not in acute distress  Cardiovascular: Irregular rate and rhythm, S1/S2, no murmurs, no rubs, no gallops  Respiratory: Clear to auscultation bilaterally, no wheezing, no crackles, no rhonchi  Abdomen: Soft, non tender, non distended, bowel sounds present, no guarding  Extremities: No edema, pulses DP and PT palpable bilaterally  Neuro: Grossly nonfocal  Data Reviewed: Basic Metabolic Panel:  Recent Labs Lab 11/02/12 2020 11/03/12 0520 11/04/12 0600 11/06/12 0515 11/07/12 0410  NA 134* 135 135 137 138  K 4.4 4.0 3.6 3.4* 3.4*  CL 94* 100 104 104 105  CO2 28 26 24 25 25   GLUCOSE 122* 108* 56* 106* 109*  BUN 15 11 9  5* 5*  CREATININE 0.69 0.69 0.63 0.62 0.64  CALCIUM 9.0 7.6* 7.4* 7.8* 7.8*  MG 1.2*  --  1.5  --   --   PHOS 4.5  --   --   --   --    CBC:  Recent Labs Lab 11/02/12  2020 11/03/12 0520 11/04/12 0600 11/06/12 0515 11/07/12 0410  WBC 14.0* 13.8* 10.4 9.3 8.2  NEUTROABS 10.7*  --   --   --   --   HGB 10.3* 9.2* 8.7* 8.8* 9.0*  HCT 31.1* 27.4* 25.8* 26.3* 26.1*  MCV 87.9 87.8 89.0 88.6 88.2  PLT 888* 770* 602* 640* 652*   CBG:  Recent Labs Lab 11/04/12 1040 11/04/12 1152 11/05/12 0805 11/06/12 0748 11/07/12 0743  GLUCAP 64* 116* 121* 98 97    Recent Results (from the past 240 hour(s))  CULTURE, BLOOD (ROUTINE X 2)     Status: None   Collection Time    11/03/12  1:40 AM      Result Value Range Status   Specimen Description BLOOD RIGHT ARM   Final   Special Requests BOTTLES DRAWN AEROBIC AND ANAEROBIC 3CC   Final   Culture  Setup Time 11/03/2012 08:41    Final   Culture     Final   Value:        BLOOD CULTURE RECEIVED NO GROWTH TO DATE CULTURE WILL BE HELD FOR 5 DAYS BEFORE ISSUING A FINAL NEGATIVE REPORT   Report Status PENDING   Incomplete  CULTURE, BLOOD (ROUTINE X 2)     Status: None   Collection Time    11/03/12  1:45 AM      Result Value Range Status   Specimen Description BLOOD PORT   Final   Special Requests BOTTLES DRAWN AEROBIC AND ANAEROBIC 3CC   Final   Culture  Setup Time 11/03/2012 08:41   Final   Culture     Final   Value:        BLOOD CULTURE RECEIVED NO GROWTH TO DATE CULTURE WILL BE HELD FOR 5 DAYS BEFORE ISSUING A FINAL NEGATIVE REPORT   Report Status PENDING   Incomplete     Scheduled Meds: . ipratropium  0.5 mg Nebulization QID   And  . albuterol  2.5 mg Nebulization QID  . antiseptic oral rinse  15 mL Mouth Rinse q12n4p  . atenolol  12.5 mg Oral BID  . budesonide-formoterol  2 puff Inhalation BID  . buPROPion  150 mg Oral BID  . busPIRone  15 mg Oral BID  . chlorhexidine  15 mL Mouth Rinse BID  . clopidogrel  75 mg Oral Q breakfast  . enoxaparin (LOVENOX) injection  40 mg Subcutaneous Q24H  . pantoprazole  80 mg Oral Daily  . sertraline  100 mg Oral QAC breakfast  . simvastatin  20 mg Oral q1800   Continuous Infusions: . dextrose 5 % and 0.9% NaCl 1,000 mL (11/06/12 1742)     Debbora Presto, MD  [facility] Pager [phone]  If 7PM-7AM, please contact night-coverage [email or web address] Password [email or web address] 11/07/2012, 11:02 AM   LOS: 5 days

## 2012-11-07 NOTE — Progress Notes (Signed)
Patient vomited approx. 200 cc's. She is unable to receive any nausea medicine at this time, as it is not due for several more hours. Hospitalist on call made aware, and new orders received. Patient stated she feels better after vomiting. Will check patient frequently.

## 2012-11-08 DIAGNOSIS — J209 Acute bronchitis, unspecified: Secondary | ICD-10-CM

## 2012-11-08 LAB — GLUCOSE, CAPILLARY

## 2012-11-08 MED ORDER — ONDANSETRON HCL 4 MG/2ML IJ SOLN
8.0000 mg | INTRAMUSCULAR | Status: DC | PRN
  Filled 2012-11-08 (×2): qty 4

## 2012-11-08 MED ORDER — SODIUM CHLORIDE 0.9 % IV SOLN
80.0000 mg | INTRAVENOUS | Status: DC
  Administered 2012-11-09 – 2012-11-10 (×2): 80 mg via INTRAVENOUS
  Filled 2012-11-08 (×4): qty 80

## 2012-11-08 MED ORDER — SODIUM CHLORIDE 0.9 % IV SOLN
INTRAVENOUS | Status: AC
  Administered 2012-11-08: 22:00:00 via INTRAVENOUS

## 2012-11-08 MED ORDER — PROMETHAZINE HCL 25 MG/ML IJ SOLN
25.0000 mg | INTRAMUSCULAR | Status: DC | PRN
  Administered 2012-11-09 (×2): 25 mg via INTRAVENOUS
  Filled 2012-11-08 (×2): qty 1

## 2012-11-08 MED ORDER — DIAZEPAM 5 MG/ML IJ SOLN
5.0000 mg | Freq: Every evening | INTRAMUSCULAR | Status: DC | PRN
  Administered 2012-11-08 – 2012-11-12 (×5): 5 mg via INTRAVENOUS
  Filled 2012-11-08 (×5): qty 2

## 2012-11-08 MED ORDER — POTASSIUM CHLORIDE 10 MEQ/100ML IV SOLN
10.0000 meq | INTRAVENOUS | Status: AC
  Administered 2012-11-08 (×3): 10 meq via INTRAVENOUS
  Filled 2012-11-08 (×3): qty 100

## 2012-11-08 MED ORDER — POTASSIUM CHLORIDE CRYS ER 20 MEQ PO TBCR
40.0000 meq | EXTENDED_RELEASE_TABLET | Freq: Once | ORAL | Status: DC
  Filled 2012-11-08: qty 2

## 2012-11-08 MED ORDER — LOPERAMIDE HCL 2 MG PO CAPS
2.0000 mg | ORAL_CAPSULE | ORAL | Status: DC | PRN
  Administered 2012-11-08 – 2012-11-11 (×3): 2 mg via ORAL
  Filled 2012-11-08 (×3): qty 1

## 2012-11-08 MED ORDER — FLUCONAZOLE 100 MG PO TABS
100.0000 mg | ORAL_TABLET | Freq: Every day | ORAL | Status: DC
  Administered 2012-11-08 – 2012-11-13 (×6): 100 mg via ORAL
  Filled 2012-11-08 (×6): qty 1

## 2012-11-08 NOTE — Progress Notes (Addendum)
Patient ID: [name] [name], female   DOB: 21-Apr-1941, 72 y.o.   MRN: [id number]  [facility] PROGRESS NOTE  [name] [name]:[id number] DOB: 09-11-1940 DOA: 11/02/2012 PCP: Laurette Schimke, MD  Brief HPI:  Pt is a 72 y.o. 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   In the ED, she was found to have early or partial SBO on CT abdomen. Hospitalist has been asked to admit for further evaluation.   Principal Problem:  Partial small bowel obstruction  - attempted to advance diet to soft solids but pt did not tolerate well and had two episodes of vomiting - will back up on a diet and will discuss in AM with primary oncologist about tube feeds - continue supportive care, analgesia and antiemetics as needed and will change to IV for now Active Problems:  Ascites, malignant  - per oncology, appears to be improving based on CT scan  - 1.5 L removed 04/03  Ovarian cancer  - per oncology, appreciate assistance  Anemia of chronic disease  - Hg and Hct remains stable over 48 hours  Hypokalemia  - mild, will supplement again today and repeat BMP in AM  Diarrhea  - C. 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 Abdominal ascites is improved since previous study with drainage catheter in place. Interval development of mild small bowel dilatation with air-fluid levels suggesting early or partial obstruction.  Antibiotics:  None  Code Status: Full  Family Communication: Pt at bedside  Disposition Plan: Home when medically stable, PT evaluation prior to discharge   HPI/Subjective: No events overnight.    Objective: Filed Vitals:   11/07/12 2103 11/07/12 2123 11/08/12 0630 11/08/12 0817  BP:  117/56 97/45   Pulse:  97 91   Temp:  98.8 F (37.1 C) 98.4 F (36.9 C)   TempSrc:  Oral Oral   Resp:  16 14   Height:      Weight:      SpO2: 93% 93% 93% 95%    Intake/Output Summary (Last 24 hours) at 11/08/12 1521 Last data filed at 11/08/12 0100  Gross per 24 hour  Intake  452.5 ml  Output    850 ml  Net -397.5 ml    Exam:   General:  Pt is alert, follows commands appropriately, not in acute distress  Cardiovascular: Regular rate and rhythm, S1/S2, no murmurs, no rubs, no gallops  Respiratory: Clear to auscultation bilaterally, no wheezing, no crackles, no rhonchi  Abdomen: Soft, non tender, slightly distended, bowel sounds present but very faint, no guarding  Extremities: No edema, pulses DP and PT palpable bilaterally  Data Reviewed: Basic Metabolic Panel:  Recent Labs Lab 11/02/12 2020 11/03/12 0520 11/04/12 0600 11/06/12 0515 11/07/12 0410  NA 134* 135 135 137 138  K 4.4 4.0 3.6 3.4* 3.4*  CL 94* 100 104 104 105  CO2 28 26 24 25 25   GLUCOSE 122* 108* 56* 106* 109*  BUN 15 11 9  5* 5*  CREATININE 0.69 0.69 0.63 0.62 0.64  CALCIUM 9.0 7.6* 7.4* 7.8* 7.8*  MG 1.2*  --  1.5  --   --   PHOS 4.5  --   --   --   --  CBC:  Recent Labs Lab 11/02/12 2020 11/03/12 0520 11/04/12 0600 11/06/12 0515 11/07/12 0410  WBC 14.0* 13.8* 10.4 9.3 8.2  NEUTROABS 10.7*  --   --   --   --   HGB 10.3* 9.2* 8.7* 8.8* 9.0*  HCT 31.1* 27.4* 25.8* 26.3* 26.1*  MCV 87.9 87.8 89.0 88.6 88.2  PLT 888* 770* 602* 640* 652*   CBG:  Recent Labs Lab 11/04/12 1152 11/05/12 0805 11/06/12 0748 11/07/12 0743 11/08/12 0801  GLUCAP 116* 121* 98 97 73    Recent Results (from the past 240 hour(s))  CULTURE, BLOOD (ROUTINE X 2)     Status: None   Collection Time    11/03/12  1:40 AM      Result Value Range Status   Specimen Description BLOOD RIGHT ARM   Final   Special  Requests BOTTLES DRAWN AEROBIC AND ANAEROBIC 3CC   Final   Culture  Setup Time 11/03/2012 08:41   Final   Culture     Final   Value:        BLOOD CULTURE RECEIVED NO GROWTH TO DATE CULTURE WILL BE HELD FOR 5 DAYS BEFORE ISSUING A FINAL NEGATIVE REPORT   Report Status PENDING   Incomplete  CULTURE, BLOOD (ROUTINE X 2)     Status: None   Collection Time    11/03/12  1:45 AM      Result Value Range Status   Specimen Description BLOOD PORT   Final   Special Requests BOTTLES DRAWN AEROBIC AND ANAEROBIC 3CC   Final   Culture  Setup Time 11/03/2012 08:41   Final   Culture     Final   Value:        BLOOD CULTURE RECEIVED NO GROWTH TO DATE CULTURE WILL BE HELD FOR 5 DAYS BEFORE ISSUING A FINAL NEGATIVE REPORT   Report Status PENDING   Incomplete  CLOSTRIDIUM DIFFICILE BY PCR     Status: None   Collection Time    11/07/12  8:39 AM      Result Value Range Status   C difficile by pcr NEGATIVE  NEGATIVE Final     Scheduled Meds: . ipratropium  0.5 mg Nebulization QID  . albuterol  2.5 mg Nebulization QID  . atenolol  12.5 mg Oral BID  . budesonide-formoterol  2 puff Inhalation BID  . buPROPion  150 mg Oral BID  . busPIRone  15 mg Oral BID  . clopidogrel  75 mg Oral Q breakfast  . enoxaparin injection  40 mg Subcutaneous Q24H  . pantoprazole  80 mg Oral Daily  . sertraline  100 mg Oral QAC breakfast  . simvastatin  20 mg Oral q1800   Continuous Infusions:   Debbora Presto, MD  [facility] Pager [phone]  If 7PM-7AM, please contact night-coverage [email or web address] Password [email or web address] 11/08/2012, 3:21 PM   LOS: 6 days

## 2012-11-08 NOTE — Progress Notes (Signed)
Patient vomited large amt (approx. 500 cc's) immediately after taking po valium. She is requesting iv valium at this time. Hospitalist on call made aware and order received for iv valium and portable abdominal x-ray. Patient aware of plan.

## 2012-11-09 LAB — CULTURE, BLOOD (ROUTINE X 2): Culture: NO GROWTH

## 2012-11-09 LAB — GLUCOSE, CAPILLARY: Glucose-Capillary: 65 mg/dL — ABNORMAL LOW (ref 70–99)

## 2012-11-09 MED ORDER — LORAZEPAM BOLUS VIA INFUSION: 1.0000 mg | Freq: Three times a day (TID) | INTRAVENOUS | Status: DC | PRN

## 2012-11-09 MED ORDER — ONDANSETRON 8 MG/NS 50 ML IVPB
8.0000 mg | INTRAVENOUS | Status: DC | PRN
  Administered 2012-11-09 – 2012-11-11 (×5): 8 mg via INTRAVENOUS
  Filled 2012-11-09 (×5): qty 8

## 2012-11-09 MED ORDER — BIOTENE DRY MOUTH MT LIQD
15.0000 mL | Freq: Once | OROMUCOSAL | Status: AC
  Administered 2012-11-09: 15 mL via OROMUCOSAL

## 2012-11-09 MED ORDER — LORAZEPAM 2 MG/ML IJ SOLN
1.0000 mg | Freq: Three times a day (TID) | INTRAMUSCULAR | Status: DC | PRN
  Administered 2012-11-09: 1 mg via INTRAVENOUS
  Filled 2012-11-09: qty 1

## 2012-11-09 MED ORDER — DEXTROSE-NACL 5-0.9 % IV SOLN
INTRAVENOUS | Status: DC
  Administered 2012-11-09 – 2012-11-11 (×2): via INTRAVENOUS

## 2012-11-09 MED ORDER — BIOTENE DRY MOUTH MT LIQD: 15.0000 mL | Freq: Two times a day (BID) | OROMUCOSAL | Status: DC

## 2012-11-09 NOTE — Progress Notes (Signed)
Patient ID: [name] [name], female   DOB: 1941-07-31, 72 y.o.   MRN: [id number]  [facility] PROGRESS NOTE  BRET STAMOUR HYQ:[id number] DOB: November 07, 1940 DOA: 11/02/2012 PCP: Laurette Schimke, MD  Brief HPI:  Pt is a 72 y.o. 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   In the ED, she was found to have early or partial SBO on CT abdomen. Hospitalist has been asked to admit for further evaluation.   Principal Problem:  Partial small bowel obstruction  - attempted to advance diet to soft solids, patient initially tolerating well but still experiences episodes of vomiting - Patient is very reluctant to be on clear liquid diet, she feels as if she is getting IV fluids she can tolerate soft solids better - I have discussed option of tube feeds as was earlier recommended by patient's oncologist, patient is somewhat resistant to initiating tube feeds for now - Will need to readdress the issue again in the morning if vomiting persists - continue supportive care, analgesia and antiemetics as needed  Active Problems:  Ascites, malignant  - per oncology, appears to be improving based on CT scan  - 1.5 L removed 04/03  Ovarian cancer  - per oncology, appreciate assistance  Anemia of chronic disease  - Hg and Hct remains stable over 48 hours  Hypokalemia  - mild, repeat BMP in AM  Diarrhea  - C. 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 Abdominal ascites is improved since previous study with drainage catheter in place. Interval development of mild small bowel dilatation with air-fluid levels  suggesting early or partial obstruction.  Antibiotics:  Fluconazole 04/06 -->   Code Status: Full  Family Communication: Pt at bedside  Disposition Plan: Home when medically stable, PT evaluation prior to discharge   HPI/Subjective: No events overnight.   Objective: Filed Vitals:   11/08/12 2146 11/09/12 0651 11/09/12 0901 11/09/12 1332  BP: 102/48 89/46  115/53  Pulse: 107 98  104  Temp: 98.5 F (36.9 C) 98.8 F (37.1 C)  98.5 F (36.9 C)  TempSrc: Oral Oral  Oral  Resp: 16 16  16   Height:      Weight:      SpO2: 94% 94% 96% 93%    Intake/Output Summary (Last 24 hours) at 11/09/12 1603 Last data filed at 11/09/12 0834  Gross per 24 hour  Intake    600 ml  Output      0 ml  Net    600 ml    Exam:   General:  Pt is alert, follows commands appropriately, not in acute distress  Cardiovascular: Regular rate and rhythm, S1/S2, no murmurs, no rubs, no gallops  Respiratory: Clear to auscultation bilaterally, no wheezing, no crackles, no rhonchi  Abdomen: Soft, non tender, slightly distended, bowel sounds present but very soft, no guarding  Extremities: No edema, pulses DP and PT palpable bilaterally  Neuro: Grossly nonfocal  Data Reviewed: Basic Metabolic Panel:  Recent Labs Lab 11/02/12 2020 11/03/12 0520 11/04/12 0600 11/06/12 0515 11/07/12 0410  NA 134* 135 135 137 138  K 4.4 4.0 3.6 3.4* 3.4*  CL 94* 100 104 104  105  CO2 28 26 24 25 25   GLUCOSE 122* 108* 56* 106* 109*  BUN 15 11 9  5* 5*  CREATININE 0.69 0.69 0.63 0.62 0.64  CALCIUM 9.0 7.6* 7.4* 7.8* 7.8*  MG 1.2*  --  1.5  --   --   PHOS 4.5  --   --   --   --    CBC:  Recent Labs Lab 11/02/12 2020 11/03/12 0520 11/04/12 0600 11/06/12 0515 11/07/12 0410  WBC 14.0* 13.8* 10.4 9.3 8.2  NEUTROABS 10.7*  --   --   --   --   HGB 10.3* 9.2* 8.7* 8.8* 9.0*  HCT 31.1* 27.4* 25.8* 26.3* 26.1*  MCV 87.9 87.8 89.0 88.6 88.2  PLT 888* 770* 602* 640* 652*   CBG:  Recent Labs Lab  11/07/12 0743 11/08/12 0801 11/08/12 1803 11/09/12 0846 11/09/12 1438  GLUCAP 97 73 94 65* 82    Recent Results (from the past 240 hour(s))  CULTURE, BLOOD (ROUTINE X 2)     Status: None   Collection Time    11/03/12  1:40 AM      Result Value Range Status   Specimen Description BLOOD RIGHT ARM   Final   Special Requests BOTTLES DRAWN AEROBIC AND ANAEROBIC 3CC   Final   Culture  Setup Time 11/03/2012 08:41   Final   Culture NO GROWTH 5 DAYS   Final   Report Status 11/09/2012 FINAL   Final  CULTURE, BLOOD (ROUTINE X 2)     Status: None   Collection Time    11/03/12  1:45 AM      Result Value Range Status   Specimen Description BLOOD PORT   Final   Special Requests BOTTLES DRAWN AEROBIC AND ANAEROBIC 3CC   Final   Culture  Setup Time 11/03/2012 08:41   Final   Culture NO GROWTH 5 DAYS   Final   Report Status 11/09/2012 FINAL   Final  CLOSTRIDIUM DIFFICILE BY PCR     Status: None   Collection Time    11/07/12  8:39 AM      Result Value Range Status   C difficile by pcr NEGATIVE  NEGATIVE Final     Scheduled Meds: . ipratropium  0.5 mg Nebulization QID  . albuterol  2.5 mg Nebulization QID  . atenolol  12.5 mg Oral BID  . budesonide-formoterol  2 puff Inhalation BID  . buPROPion  150 mg Oral BID  . busPIRone  15 mg Oral BID  . clopidogrel  75 mg Oral Q breakfast  . enoxaparin  injection  40 mg Subcutaneous Q24H  . fluconazole  100 mg Oral Daily  . pantoprazole IV  80 mg Intravenous Q24H  . sertraline  100 mg Oral QAC breakfast   Continuous Infusions: . dextrose 5 % and 0.9% NaCl 50 mL/hr at 11/09/12 1035   Debbora Presto, MD  [facility] Pager [phone]  If 7PM-7AM, please contact night-coverage [email or web address] Password [email or web address] 11/09/2012, 4:03 PM   LOS: 7 days

## 2012-11-09 NOTE — Plan of Care (Signed)
Problem: Discharge Progression Outcomes Goal: Tolerating diet Outcome: Not Progressing Pt nauseas on and off throughout day shift today.

## 2012-11-09 NOTE — Progress Notes (Signed)
Chaplain paged at 1905 and arrived in unit at 1928.  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 This is the second time she has undergone chemo treatments, and being such she reflects on the fact that the first time her children were young and her husband walked through the process as a Multimedia programmer. When the treatments stopped the first time it was an occasion to celebrate. This time everything is old hand, and the children are older. It was easy to hide emotions when children are young, but not easy when they are adults full of adult knowledge.  [name] [name] expresses the need from time to time to be alone and "lick her wounds." This is an important part of her spiritual and mental healing, a time when she can let down and be herself, without having to put on a brave face for husband and child. RECOMMEND: Staff work with [name] [name] to find a time when she is free of visits - medical and personal - and with the least amount of disruption.   From discussions it is clear that family members are being pulled down in the same way. Dealing positively with the situation without occasional times of refection is leading to feeling of being overwhelmed.  [name] [name] expresses great appreciation for the outstanding and exceptional care give her. This appreciation is shared by family as well.   REQUEST: Follow up chaplain visit on 8 April and thereafter as needed or requested   Benjie Karvonen. Cataldo Cosgriff, DMin, APC Chaplain

## 2012-11-09 NOTE — Progress Notes (Signed)
Pt continuously nauseas through out day. Besides administering Zofran & Phenergan I also drained pt's peritoneal drain and got an output of 500 mL.   Pt was alert to self but at times confused about situation. She was light headed when standing up so monitored and assisted pt closely today.   Pt and family are asking to have a meeting with Dr. Izola Price and Dr. Darnelle Catalan if possible to review goals of care and which interventions should be taken next.   Called the chaplain in towards end of shift when family members became emotional and had asked for spiritual guidance.

## 2012-11-09 NOTE — Progress Notes (Signed)
Patient and family very concerned that patient is not on iv fluids. Patient states she feels very dry, and is unable to drink significant amt of liquids without nausea. They request that I call MD on call. Hospitalist called and made aware of above and new orders received.

## 2012-11-10 LAB — GLUCOSE, CAPILLARY

## 2012-11-10 LAB — BASIC METABOLIC PANEL
CO2: 22 mEq/L (ref 19–32)
Calcium: 6.4 mg/dL — CL (ref 8.4–10.5)
Glucose, Bld: 72 mg/dL (ref 70–99)
Sodium: 139 mEq/L (ref 135–145)

## 2012-11-10 LAB — CBC
Hemoglobin: 7.8 g/dL — ABNORMAL LOW (ref 12.0–15.0)
MCH: 30.6 pg (ref 26.0–34.0)
RBC: 2.55 MIL/uL — ABNORMAL LOW (ref 3.87–5.11)

## 2012-11-10 MED ORDER — POTASSIUM CHLORIDE 10 MEQ/100ML IV SOLN
10.0000 meq | INTRAVENOUS | Status: AC
  Administered 2012-11-10 (×3): 10 meq via INTRAVENOUS
  Filled 2012-11-10 (×3): qty 100

## 2012-11-10 NOTE — Progress Notes (Signed)
CRITICAL VALUE ALERT  Critical value received: CA++   Date of notification: 11/10/12  Time of notification:  0600  Critical value read back:yes  Nurse who received alert:  Hedda Slade  MD notified (1st page):  hospitalist  Time of first page:  0605  MD notified (2nd page):  Time of second page:  Responding MD:  Hospitalist  Time MD responded:  [phone]

## 2012-11-10 NOTE — Progress Notes (Signed)
Patient ID: [name] [name], female   DOB: 05-Dec-1940, 72 y.o.   MRN: [id number]  [facility] PROGRESS NOTE  TERISHA LOSASSO OZH:[id number] DOB: 1941-02-01 DOA: 11/02/2012 PCP: Laurette Schimke, MD  Brief HPI:  Pt is a 72 y.o. 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   In the ED, she was found to have early or partial SBO on CT abdomen. Hospitalist has been asked to admit for further evaluation.  Principal Problem:  Partial small bowel obstruction  - attempted to advance diet to soft solids, patient initially tolerating well but still experiences episodes of vomiting  - Patient is very reluctant to be on clear liquid diet, she feels as if she is getting IV fluids she can tolerate soft solids better  - I have discussed option of tube feeds as was earlier recommended by patient's oncologist - pt in agreement with starting TNA but wants to start tomorrow AM, will ask pharmacy with assistance  - continue supportive care, analgesia and antiemetics as needed  Active Problems:  Ascites, malignant  - per oncology, appears to be improving based on CT scan  - 1.5 L removed 04/03  - may need additional drain in 1-2 days Ovarian cancer  - per oncology, appreciate assistance  Anemia of chronic disease  - Hg and Hct drop since yesterday, will repeat CBC again in AM - transfusion if Hg < 7.5 Hypokalemia  - pt unable to tolerate PO K-dur, will supplement via IV as preferred by pt - will check Mg level as well, BMP in AM Diarrhea  - C. 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 Abdominal  ascites is improved since previous study with drainage catheter in place. Interval development of mild small bowel dilatation with air-fluid levels suggesting early or partial obstruction.  Antibiotics:  Fluconazole 04/06 -->   Code Status: Full  Family Communication: Pt and husband at bedside  Disposition Plan: Home when medically stable, PT evaluation prior to discharge   HPI/Subjective: No events overnight.   Objective: Filed Vitals:   11/10/12 0755 11/10/12 1153 11/10/12 1310 11/10/12 1920  BP:   112/60   Pulse:   100   Temp:   98.2 F (36.8 C)   TempSrc:   Oral   Resp:   16   Height:      Weight:      SpO2: 95% 92% 94% 97%    Intake/Output Summary (Last 24 hours) at 11/10/12 2020 Last data filed at 11/10/12 1848  Gross per 24 hour  Intake    980 ml  Output   1250 ml  Net   -270 ml    Exam:   General:  Pt is alert, follows commands appropriately, not in acute distress  Cardiovascular: Regular rate and rhythm, S1/S2, no murmurs, no rubs, no gallops  Respiratory: Clear to auscultation bilaterally, no wheezing, no crackles, no rhonchi  Abdomen: Soft, non tender, slightly distended, bowel sounds present, no guarding  Extremities: No edema, pulses DP and PT palpable bilaterally  Neuro: Grossly nonfocal  Data Reviewed: Basic Metabolic Panel:  Recent Labs Lab 11/04/12 0600 11/06/12 0515 11/07/12 0410 11/10/12 0520  NA 135 137 138 139  K 3.6 3.4* 3.4* 2.8*  CL 104 104 105 111  CO2 24 25 25 22   GLUCOSE 56* 106* 109* 72  BUN 9 5* 5* 6  CREATININE 0.63 0.62 0.64 0.52  CALCIUM 7.4* 7.8* 7.8* 6.4*  MG 1.5  --   --   --    CBC:  Recent Labs Lab 11/04/12 0600 11/06/12 0515 11/07/12 0410 11/10/12 0520  WBC 10.4 9.3 8.2 8.6  HGB 8.7* 8.8* 9.0* 7.8*  HCT 25.8* 26.3* 26.1* 22.5*  MCV 89.0 88.6 88.2 88.2  PLT 602* 640* 652* 546*   CBG:  Recent Labs Lab 11/08/12 0801 11/08/12 1803 11/09/12 0846 11/09/12 1438 11/10/12 0722  GLUCAP 73 94 65* 82  93    Recent Results (from the past 240 hour(s))  CULTURE, BLOOD (ROUTINE X 2)     Status: None   Collection Time    11/03/12  1:40 AM      Result Value Range Status   Specimen Description BLOOD RIGHT ARM   Final   Special Requests BOTTLES DRAWN AEROBIC AND ANAEROBIC 3CC   Final   Culture  Setup Time 11/03/2012 08:41   Final   Culture NO GROWTH 5 DAYS   Final   Report Status 11/09/2012 FINAL   Final  CULTURE, BLOOD (ROUTINE X 2)     Status: None   Collection Time    11/03/12  1:45 AM      Result Value Range Status   Specimen Description BLOOD PORT   Final   Special Requests BOTTLES DRAWN AEROBIC AND ANAEROBIC 3CC   Final   Culture  Setup Time 11/03/2012 08:41   Final   Culture NO GROWTH 5 DAYS   Final   Report Status 11/09/2012 FINAL   Final  CLOSTRIDIUM DIFFICILE BY PCR     Status: None   Collection Time    11/07/12  8:39 AM      Result Value Range Status   C difficile by pcr NEGATIVE  NEGATIVE Final     Scheduled Meds: . ipratropium  0.5 mg Nebulization QID   And  . albuterol  2.5 mg Nebulization QID  . antiseptic oral rinse  15 mL Mouth Rinse q12n4p  . atenolol  12.5 mg Oral BID  . budesonide-formoterol  2 puff Inhalation BID  . buPROPion  150 mg Oral BID  . busPIRone  15 mg Oral BID  . clopidogrel  75 mg Oral Q breakfast  . enoxaparin (LOVENOX) injection  40 mg Subcutaneous Q24H  . fluconazole  100 mg Oral Daily  . pantoprazole (PROTONIX) IV  80 mg Intravenous Q24H  . sertraline  100 mg Oral QAC breakfast   Continuous Infusions: . dextrose 5 % and 0.9% NaCl 50 mL/hr at 11/09/12 1035   Debbora Presto, MD  [facility] Pager [phone]  If 7PM-7AM, please contact night-coverage [email or web address] Password [email or web address] 11/10/2012, 8:20 PM   LOS: 8 days

## 2012-11-10 NOTE — Evaluation (Signed)
Physical Therapy Evaluation Patient Details Name: [name] [name] MRN: [id number] DOB: [birth year] Today's Date: 11/10/2012 Time: 4098-1191 PT Time Calculation (min): 26 min  PT Assessment / Plan / Recommendation Clinical Impression  Pt. is 72 yo female admitted 11/02/12 with N/V, with h/o breast and ovarian cancer. s/p peritoneal cather place 3/27. Pt. found to have SBO. Pt. lives with spouse. Pt. was independent until recently. Pt. will benefit from PT whild in acute care to return to functional independence.    PT Assessment  Patient needs continued PT services    Follow Up Recommendations  No PT follow up    Does the patient have the potential to tolerate intense rehabilitation      Barriers to Discharge        Equipment Recommendations  Rolling walker with 5" wheels    Recommendations for Other Services     Frequency Min 3X/week    Precautions / Restrictions Precautions Precautions: Fall   Pertinent Vitals/Pain       Mobility  Bed Mobility Bed Mobility: Supine to Sit;Sitting - Scoot to Edge of Bed Supine to Sit: 4: Min assist Sitting - Scoot to [place] of Bed: 4: Min guard Details for Bed Mobility Assistance: some trunk assistance to get to upright at edge Transfers Transfers: Sit to Stand;Stand to Sit Sit to Stand: 4: Min assist;From bed;With upper extremity assist Stand to Sit: To chair/3-in-1;4: Min guard;With upper extremity assist Details for Transfer Assistance: some support to stand from bed, cues to use UE's to push from surfaces Ambulation/Gait Ambulation/Gait Assistance: 4: Min assist Ambulation Distance (Feet): 250 Feet Assistive device: Rolling walker Ambulation/Gait Assistance Details: some assistance to steer RW, pt had some difficulty keeping it straight. Gait Pattern: Step-through pattern    Exercises General Exercises - Lower Extremity Ankle Circles/Pumps: AROM;Both;10 reps;Seated Long Arc Quad: AROM;Both;10 reps;Seated Hip Flexion/Marching:  AROM;Both;10 reps;Seated   PT Diagnosis: Difficulty walking;Generalized weakness  PT Problem List: Decreased strength;Decreased activity tolerance;Decreased mobility;Decreased knowledge of use of DME;Decreased knowledge of precautions PT Treatment Interventions: DME instruction;Gait training;Functional mobility training;Therapeutic activities;Therapeutic exercise;Patient/family education   PT Goals Acute Rehab PT Goals PT Goal Formulation: With patient/family Time For Goal Achievement: 11/24/12 Potential to Achieve Goals: Good Pt will go Supine/Side to Sit: Independently PT Goal: Supine/Side to Sit - Progress: Goal set today Pt will go Sit to Supine/Side: Independently PT Goal: Sit to Supine/Side - Progress: Goal set today Pt will go Sit to Stand: with modified independence PT Goal: Sit to Stand - Progress: Goal set today Pt will go Stand to Sit: with modified independence PT Goal: Stand to Sit - Progress: Goal set today Pt will Ambulate: >150 feet;with modified independence;with least restrictive assistive device PT Goal: Ambulate - Progress: Goal set today Pt will Perform Home Exercise Program: Independently PT Goal: Perform Home Exercise Program - Progress: Goal set today  Visit Information  Last PT Received On: 11/10/12 Assistance Needed: +1    Subjective Data  Subjective: My legs are so weak. I need to get up and move. Patient Stated Goal: to walk and get stronger.   Prior Functioning  Home Living Lives With: Spouse Available Help at Discharge: Family Type of Home: House Home Access: Stairs to enter Secretary/administrator of Steps: 2 Entrance Stairs-Rails: None Home Layout: One level Bathroom Shower/Tub: Health visitor: Standard Home Adaptive Equipment: Straight cane Prior Function Level of Independence: Independent Able to Take Stairs?: Yes Comments: Independent until 1 week ago    Cognition  Cognition Overall Cognitive Status:  Difficult to  assess Arousal/Alertness: Awake/alert Behavior During Session: [facility] for tasks performed Cognition - Other Comments: some difficulties answering q's re: home layout.    Extremity/Trunk Assessment Right Upper Extremity Assessment RUE ROM/Strength/Tone: WFL for tasks assessed Left Upper Extremity Assessment LUE ROM/Strength/Tone: WFL for tasks assessed Right Lower Extremity Assessment RLE ROM/Strength/Tone: Deficits RLE ROM/Strength/Tone Deficits: grossly 4/5 RLE Sensation: WFL - Light Touch Left Lower Extremity Assessment LLE ROM/Strength/Tone: Deficits LLE ROM/Strength/Tone Deficits: same as R Trunk Assessment Trunk Assessment: Normal   Balance Balance Balance Assessed: Yes Static Sitting Balance Static Sitting - Balance Support: Bilateral upper extremity supported Static Sitting - Level of Assistance: 5: Stand by assistance  End of Session PT - End of Session Activity Tolerance: Patient tolerated treatment well;Patient limited by fatigue Patient left: in chair;with call bell/phone within reach;with family/visitor present  GP     Rada Hay 11/10/2012, 12:46 PM Blanchard Kelch PT [phone]

## 2012-11-11 LAB — GLUCOSE, CAPILLARY: Glucose-Capillary: 90 mg/dL (ref 70–99)

## 2012-11-11 LAB — BASIC METABOLIC PANEL
Chloride: 103 mEq/L (ref 96–112)
GFR calc Af Amer: >90 mL/min (ref >90)
Potassium: 3.4 mEq/L — ABNORMAL LOW (ref 3.5–5.1)

## 2012-11-11 LAB — CBC
HCT: 27.4 % — ABNORMAL LOW (ref 36.0–46.0)
Hemoglobin: 9.5 g/dL — ABNORMAL LOW (ref 12.0–15.0)
WBC: 10.9 10*3/uL — ABNORMAL HIGH (ref 4.0–10.5)

## 2012-11-11 MED ORDER — FAT EMULSION 20 % IV EMUL
240.0000 mL | INTRAVENOUS | Status: AC
  Administered 2012-11-11: 240 mL via INTRAVENOUS
  Filled 2012-11-11: qty 250

## 2012-11-11 MED ORDER — DEXTROSE-NACL 5-0.9 % IV SOLN: INTRAVENOUS | Status: DC

## 2012-11-11 MED ORDER — MAGNESIUM SULFATE 40 MG/ML IJ SOLN
2.0000 g | Freq: Once | INTRAMUSCULAR | Status: AC
  Administered 2012-11-11: 2 g via INTRAVENOUS
  Filled 2012-11-11: qty 50

## 2012-11-11 MED ORDER — MAGIC MOUTHWASH W/LIDOCAINE
5.0000 mL | Freq: Four times a day (QID) | ORAL | Status: DC | PRN
  Administered 2012-11-11: 5 mL via ORAL
  Filled 2012-11-11: qty 5

## 2012-11-11 MED ORDER — DEXTROSE-NACL 5-0.9 % IV SOLN: INTRAVENOUS | Status: AC

## 2012-11-11 MED ORDER — TRACE MINERALS CR-CU-F-FE-I-MN-MO-SE-ZN IV SOLN
INTRAVENOUS | Status: AC
  Administered 2012-11-11: 18:00:00 via INTRAVENOUS
  Filled 2012-11-11: qty 1000

## 2012-11-11 MED ORDER — PANTOPRAZOLE SODIUM 40 MG IV SOLR
40.0000 mg | Freq: Two times a day (BID) | INTRAVENOUS | Status: DC
  Administered 2012-11-12 – 2012-11-13 (×4): 40 mg via INTRAVENOUS
  Filled 2012-11-11 (×5): qty 40

## 2012-11-11 NOTE — Progress Notes (Signed)
INITIAL NUTRITION ASSESSMENT  DOCUMENTATION CODES Per approved criteria  -Not Applicable   INTERVENTION: - TPN per pharmacy - continue to recommend enteral nutrition, even trickle TFs as an option as pt passing BMs - Ensure Complete BID - Will continue to monitor   NUTRITION DIAGNOSIS: Altered GI function related to partial small bowel obstruction as evidenced by MD notes.   Goal: 1. TPN to meet >90% of estimated nutritional needs 2. Pt to consume >90% of meals/supplements  Monitor:  Weights, labs, intake, TPN, BM  Reason for Assessment: Consult for TPN  72 y.o. female  Admitting Dx: Partial small bowel obstruction  ASSESSMENT: Pt admitted with nausea, vomiting, and increased weakness for 3 days PTA. Pt found to have partial small bowel obstruction. Pt with malignant ascites and ovarian CA. Noted problems of ongoing vomiting in the past several days. MD discussed enteral nutrition with pt on 4/7 however pt was resistant and TPN scheduled to start today. Last abdominal x-ray on 4/5 showed persistent partial small bowel obstruction. Attempted to meet with pt on three different occasions today with the last attempt pt requesting visit on another day. Noted pt ate 20% of breakfast this morning.   Height: Ht Readings from Last 1 Encounters:  11/03/12 5\' 4"  (1.626 m)    Weight: Wt Readings from Last 1 Encounters:  11/03/12 134 lb 11.2 oz (61.1 kg)    Ideal Body Weight: 120 lb  % Ideal Body Weight: 112  Wt Readings from Last 10 Encounters:  11/03/12 134 lb 11.2 oz (61.1 kg)  10/29/12 134 lb 6.4 oz (60.963 kg)  10/27/12 136 lb 8 oz (61.916 kg)  10/22/12 139 lb 3.2 oz (63.141 kg)  10/13/12 139 lb 8 oz (63.277 kg)  10/07/12 135 lb 12.8 oz (61.598 kg)  10/01/12 135 lb (61.236 kg)  09/24/12 135 lb (61.236 kg)  09/22/12 137 lb (62.143 kg)  09/10/12 140 lb 11.2 oz (63.821 kg)    Usual Body Weight: 134 lb  % Usual Body Weight: 100  BMI:  Body mass index is 23.11  kg/(m^2).  Estimated Nutritional Needs: Kcal: 1550-1850 Protein: 80-90g Fluid: 1.5-1.8L/day  Skin: Intact  Diet Order: Dysphagia 3, thin  EDUCATION NEEDS: -No education needs identified at this time   Intake/Output Summary (Last 24 hours) at 11/11/12 1316 Last data filed at 11/11/12 0807  Gross per 24 hour  Intake    520 ml  Output   1100 ml  Net   -580 ml    Last BM: 4/8  Labs:   Recent Labs Lab 11/07/12 0410 11/10/12 0520 11/11/12 0600  NA 138 139 136  K 3.4* 2.8* 3.4*  CL 105 111 103  CO2 25 22 24   BUN 5* 6 6  CREATININE 0.64 0.52 0.61  CALCIUM 7.8* 6.4* 7.5*  MG  --   --  1.1*  GLUCOSE 109* 72 76    CBG (last 3)   Recent Labs  11/09/12 1438 11/10/12 0722 11/11/12 0807  GLUCAP 82 93 90    Scheduled Meds: . ipratropium  0.5 mg Nebulization QID   And  . albuterol  2.5 mg Nebulization QID  . antiseptic oral rinse  15 mL Mouth Rinse q12n4p  . atenolol  12.5 mg Oral BID  . budesonide-formoterol  2 puff Inhalation BID  . buPROPion  150 mg Oral BID  . busPIRone  15 mg Oral BID  . clopidogrel  75 mg Oral Q breakfast  . enoxaparin (LOVENOX) injection  40 mg Subcutaneous Q24H  .  fluconazole  100 mg Oral Daily  . magnesium sulfate 1 - 4 g bolus IVPB  2 g Intravenous Once  . pantoprazole (PROTONIX) IV  40 mg Intravenous Q12H  . sertraline  100 mg Oral QAC breakfast    Continuous Infusions: . dextrose 5 % and 0.9% NaCl    . dextrose 5 % and 0.9% NaCl 50 mL/hr at 11/11/12 1300  . Marland KitchenTPN (CLINIMIX-E) Adult     And  . fat emulsion      Past Medical History  Diagnosis Date  . History of breast cancer   . COPD (chronic obstructive pulmonary disease)   . GERD (gastroesophageal reflux disease)   . Diabetes mellitus     type II  . Hx of colonic polyps   . Osteoarthritis   . MVP (mitral valve prolapse)   . Dysrhythmia     pt states runs a little fast   . Shortness of breath     with exertion   . Depression   . Anxiety   . Heart murmur   .  Cancer     left breast  . Ovarian cancer     active chemotherapy  . Stroke   . Ascites 10/22/2012    Past Surgical History  Procedure Laterality Date  . Cesarean section  1980, 1982    X 2   . Laparoscopy      work up for infertility  . Tympanoplasty      left X 2  . Left breast fibroadenoma removal  1960's  . Breast cyst aspirations    . Left modified radial mastectomy    . Laparotomy  10/29/2011    Procedure: EXPLORATORY LAPAROTOMY;  Surgeon: Rejeana Brock A. Duard Brady, MD;  Location: [facility];  Service: Gynecology;  Laterality: N/A;  . Abdominal hysterectomy  10/29/2011    Procedure: HYSTERECTOMY ABDOMINAL;  Surgeon: Rejeana Brock A. Duard Brady, MD;  Location: [facility];  Service: Gynecology;  Laterality: N/A;  Total abdominal hysterectomy  . Salpingoophorectomy  10/29/2011    Procedure: SALPINGO OOPHERECTOMY;  Surgeon: Rejeana Brock A. Duard Brady, MD;  Location: [facility];  Service: Gynecology;  Laterality: Bilateral;     Levon Hedger MS, RD, LDN [phone] Pager [phone] After Hours Pager

## 2012-11-11 NOTE — Progress Notes (Signed)
PARENTERAL NUTRITION CONSULT NOTE - INITIAL  Pharmacy Consult for  TNA Indication: SBO  No Known Allergies  Patient Measurements: Height: 5\' 4"  (162.6 cm) Weight: 134 lb 11.2 oz (61.1 kg) IBW/kg (Calculated) : 54.7 Adjusted Body Weight: 57kg Usual Weight:   Vital Signs: Temp: 98.8 F (37.1 C) (04/09 0525) Temp src: Oral (04/09 0525) BP: 98/58 mmHg (04/09 0525) Pulse Rate: 103 (04/09 0525) Intake/Output from previous day: 04/08 0701 - 04/09 0700 In: 740 [P.O.:240; I.V.:400; IV Piggyback:100] Out: 1600 [Urine:850; Stool:750] Intake/Output from this shift: Total I/O In: 120 [P.O.:120] Out: -   Labs:  Recent Labs  11/10/12 0520 11/11/12 0600  WBC 8.6 10.9*  HGB 7.8* 9.5*  HCT 22.5* 27.4*  PLT 546* 639*     Recent Labs  11/10/12 0520 11/11/12 0600  NA 139 136  K 2.8* 3.4*  CL 111 103  CO2 22 24  GLUCOSE 72 76  BUN 6 6  CREATININE 0.52 0.61  CALCIUM 6.4* 7.5*  MG  --  1.1*   Estimated Creatinine Clearance: 57.3 ml/min (by C-G formula based on Cr of 0.61).    Recent Labs  11/09/12 1438 11/10/12 0722 11/11/12 0807  GLUCAP 82 93 90    Medical History: Past Medical History  Diagnosis Date  . History of breast cancer   . COPD (chronic obstructive pulmonary disease)   . GERD (gastroesophageal reflux disease)   . Diabetes mellitus     type II  . Hx of colonic polyps   . Osteoarthritis   . MVP (mitral valve prolapse)   . Dysrhythmia     pt states runs a little fast   . Shortness of breath     with exertion   . Depression   . Anxiety   . Heart murmur   . Cancer     left breast  . Ovarian cancer     active chemotherapy  . Stroke   . Ascites 10/22/2012    Medications:  Scheduled:  . ipratropium  0.5 mg Nebulization QID   And  . albuterol  2.5 mg Nebulization QID  . antiseptic oral rinse  15 mL Mouth Rinse q12n4p  . atenolol  12.5 mg Oral BID  . budesonide-formoterol  2 puff Inhalation BID  . buPROPion  150 mg Oral BID  . busPIRone   15 mg Oral BID  . clopidogrel  75 mg Oral Q breakfast  . enoxaparin (LOVENOX) injection  40 mg Subcutaneous Q24H  . fluconazole  100 mg Oral Daily  . magnesium sulfate 1 - 4 g bolus IVPB  2 g Intravenous Once  . pantoprazole (PROTONIX) IV  40 mg Intravenous Q12H  . sertraline  100 mg Oral QAC breakfast  . [DISCONTINUED] pantoprazole (PROTONIX) IV  80 mg Intravenous Q24H   Infusions:  . dextrose 5 % and 0.9% NaCl 50 mL/hr at 11/11/12 1016   PRN: albuterol, diazepam, Ipratropium-Albuterol, lidocaine-prilocaine, loperamide, LORazepam, morphine injection, ondansetron (ZOFRAN) IV, oxyCODONE, promethazine  Insulin Requirements in the past 24 hours:  None - no SSI ordered/needed  Nutritional Goals:  RD recs: Clinimix E 5/15  at a goal rate of 39ml/hr + IVFE 20% at 68ml/hr on MWF to provide: 84g/day protein, 1398Kcal/day avg.(1193Kcal/day STTHS, 1672Kcal/day MWF).  Current Nutrition:  Dysphagia 3 Diet started 4/6  IVF: dextrose 5 %-0.9 % sodium chloride infusion @ 50 ml/hr  Assessment: 71 YOF on chemotherapy for ovarian ca with peritoneal carcinomatosis, admitted 3/31 with generalized abdominal pain, nausea and vomiting. She was found to have  early or partial SBO per CT.  Has failed to tolerate advancement of diet so TNA per pharmacy management to start 4/9  Glucose - 65-93  Electrolytes - K slightly low, increased from 4/8 (2.8 --> 3.4) after replacement 4/8. Mg low, 2g IV bolus ordered. Other lytes ok  LFTs - wnl 3/25  TGs - no data  Prealbumin - no data  Plan:   Start Clinimix E 5/15% @ 55ml/hr, titrate to goal as tolerated. If plan is long-term TNA for nutrition then can transition to cyclic.   Lipids 20% @ 110ml/hr MWF only d/t shortage (convert to cyclic after TNA @ goal if necessary)  Reduce MIVF to [facility] after TNA starts  IV MVI/TE MWF only d/t shortage, convert to PO if able to tolerate  F/U RD assessement of estimated nutrtional needs  AM CMET, Mg, Phos, lipids and  prealbumin  Routine TNA labs Monday and Thursdays  Gwen Her PharmD  [phone] 11/11/2012 12:40 PM

## 2012-11-11 NOTE — Progress Notes (Signed)
Patient ID: [name] [name], female   DOB: 07/02/1941, 72 y.o.   MRN: [id number]  [facility] PROGRESS NOTE  [name] [name]:[id number] DOB: 03-02-1941 DOA: 11/02/2012 PCP: Laurette Schimke, MD  Brief HPI:  Pt is a 72 y.o. 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 In the ED, she was found to have early or partial SBO on CT abdomen. Hospitalist has been asked to admit for further evaluation.   Active Problems:   Partial small bowel obstruction  Stable. Patient tolerating small quantities. SBO most likely due to cancer. Since she is not meeting nutritional requirement will initiate TNA. Discussed with patient and daughters. Patient complains of mouth pain. Will add magic mouthwash. 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 Ascites being drained periodically.   Anemia of chronic disease  Hgb 9.5 today. Transfusion if Hg < 7.5  Hypokalemia  Pt unable to tolerate PO K-dur, will supplement via IV as preferred by pt. Mg was low and was repleted IV.   Diarrhea  C. Diff by PCR negative, continue Imodium   Tachycardia  Rate better controlled, continue Atenolol   Vaginal yeast infection  Continue Fluconazole   Code Status: Full  Family Communication: Pt and daughter's at bedside  Disposition Plan: Anticipate discharge by end of week. Discussed with Dr. Darnelle Catalan.  ----------------------------------------------------------------------------------------------------------------------------------  Consultants:  Oncology   Antibiotics:  Fluconazole 04/06 -->   HPI/Subjective: Patient feels weak. Pain in abdomen is reasonably well controlled. No vomiting or diarrhea.  Objective: Filed  Vitals:   11/10/12 2101 11/11/12 0525 11/11/12 0832 11/11/12 1309  BP: 111/53 98/58  110/57  Pulse: 89 103  103  Temp: 99 F (37.2 C) 98.8 F (37.1 C)  98.8 F (37.1 C)  TempSrc: Oral Oral  Oral  Resp: 16 16  16   Height:      Weight:      SpO2:  94% 95% 96%    Intake/Output Summary (Last 24 hours) at 11/11/12 1355 Last data filed at 11/11/12 1343  Gross per 24 hour  Intake   1020 ml  Output   1600 ml  Net   -580 ml    Exam:   General:  Pt is alert, follows commands appropriately, not in acute distress  Cardiovascular: Regular rate and rhythm, S1/S2, no murmurs, no rubs, no gallops  Respiratory: Clear to auscultation bilaterally, no wheezing, no crackles, no rhonchi  Abdomen: Soft, slightly tender diffusely, slightly distended, bowel sounds present, no guarding  Extremities: No edema, pulses DP and PT palpable bilaterally  Neuro: Grossly nonfocal  Data Reviewed: Basic Metabolic Panel:  Recent Labs Lab 11/06/12 0515 11/07/12 0410 11/10/12 0520 11/11/12 0600  NA 137 138 139 136  K 3.4* 3.4* 2.8* 3.4*  CL 104 105 111 103  CO2 25 25 22 24   GLUCOSE 106* 109* 72 76  BUN 5* 5* 6 6  CREATININE 0.62 0.64 0.52 0.61  CALCIUM 7.8* 7.8* 6.4* 7.5*  MG  --   --   --  1.1*   CBC:  Recent Labs Lab 11/06/12 0515 11/07/12 0410 11/10/12 0520 11/11/12 0600  WBC 9.3 8.2 8.6 10.9*  HGB 8.8* 9.0* 7.8* 9.5*  HCT 26.3* 26.1* 22.5* 27.4*  MCV 88.6 88.2 88.2 87.8  PLT 640* 652*  546* 639*   CBG:  Recent Labs Lab 11/08/12 1803 11/09/12 0846 11/09/12 1438 11/10/12 0722 11/11/12 0807  GLUCAP 94 65* 82 93 90    Recent Results (from the past 240 hour(s))  CULTURE, BLOOD (ROUTINE X 2)     Status: None   Collection Time    11/03/12  1:40 AM      Result Value Range Status   Specimen Description BLOOD RIGHT ARM   Final   Special Requests BOTTLES DRAWN AEROBIC AND ANAEROBIC 3CC   Final   Culture  Setup Time 11/03/2012 08:41   Final   Culture NO GROWTH 5 DAYS   Final    Report Status 11/09/2012 FINAL   Final  CULTURE, BLOOD (ROUTINE X 2)     Status: None   Collection Time    11/03/12  1:45 AM      Result Value Range Status   Specimen Description BLOOD PORT   Final   Special Requests BOTTLES DRAWN AEROBIC AND ANAEROBIC 3CC   Final   Culture  Setup Time 11/03/2012 08:41   Final   Culture NO GROWTH 5 DAYS   Final   Report Status 11/09/2012 FINAL   Final  CLOSTRIDIUM DIFFICILE BY PCR     Status: None   Collection Time    11/07/12  8:39 AM      Result Value Range Status   C difficile by pcr NEGATIVE  NEGATIVE Final     Scheduled Meds: . ipratropium  0.5 mg Nebulization QID   And  . albuterol  2.5 mg Nebulization QID  . antiseptic oral rinse  15 mL Mouth Rinse q12n4p  . atenolol  12.5 mg Oral BID  . budesonide-formoterol  2 puff Inhalation BID  . buPROPion  150 mg Oral BID  . busPIRone  15 mg Oral BID  . clopidogrel  75 mg Oral Q breakfast  . enoxaparin (LOVENOX) injection  40 mg Subcutaneous Q24H  . fluconazole  100 mg Oral Daily  . pantoprazole (PROTONIX) IV  40 mg Intravenous Q12H  . sertraline  100 mg Oral QAC breakfast   Continuous Infusions: . dextrose 5 % and 0.9% NaCl    . dextrose 5 % and 0.9% NaCl 50 mL/hr at 11/11/12 1300  . Marland KitchenTPN (CLINIMIX-E) Adult     And  . fat emulsion     Osvaldo Shipper, MD  [facility] Pager [phone]  If 7PM-7AM, please contact night-coverage [email or web address] Password [email or web address] 11/11/2012, 1:55 PM   LOS: 9 days

## 2012-11-11 NOTE — Progress Notes (Signed)
Aspira drain drained. 500 cc clear yellow fluid removed. Patient tolerated procedure well with mild complaints of cramping towards the end of procedure. At the time of complaint procedure was stopped.

## 2012-11-12 DIAGNOSIS — E41 Nutritional marasmus: Secondary | ICD-10-CM

## 2012-11-12 LAB — GLUCOSE, CAPILLARY
Glucose-Capillary: 184 mg/dL — ABNORMAL HIGH (ref 70–99)
Glucose-Capillary: 229 mg/dL — ABNORMAL HIGH (ref 70–99)
Glucose-Capillary: 174 mg/dL — ABNORMAL HIGH (ref 70–99)

## 2012-11-12 LAB — COMPREHENSIVE METABOLIC PANEL
BUN: 8 mg/dL (ref 6–23)
CO2: 26 mEq/L (ref 19–32)
Calcium: 7.6 mg/dL — ABNORMAL LOW (ref 8.4–10.5)
Creatinine, Ser: 0.57 mg/dL (ref 0.50–1.10)
GFR calc Af Amer: >90 mL/min (ref >90)
GFR calc non Af Amer: >90 mL/min (ref >90)
Glucose, Bld: 215 mg/dL — ABNORMAL HIGH (ref 70–99)
Total Protein: 4.7 g/dL — ABNORMAL LOW (ref 6.0–8.3)

## 2012-11-12 LAB — MAGNESIUM: Magnesium: 1.7 mg/dL (ref 1.5–2.5)

## 2012-11-12 LAB — DIFFERENTIAL
Basophils Relative: 0 % (ref 0–1)
Eosinophils Absolute: 0.1 10*3/uL (ref 0.0–0.7)
Eosinophils Relative: 1 % (ref 0–5)
Lymphs Abs: 1.6 10*3/uL (ref 0.7–4.0)

## 2012-11-12 LAB — CBC
MCH: 29.5 pg (ref 26.0–34.0)
MCHC: 33.7 g/dL (ref 30.0–36.0)
MCV: 87.5 fL (ref 78.0–100.0)
Platelets: 646 10*3/uL — ABNORMAL HIGH (ref 150–400)
RBC: 3.12 MIL/uL — ABNORMAL LOW (ref 3.87–5.11)

## 2012-11-12 LAB — PREALBUMIN: Prealbumin: 5.8 mg/dL — ABNORMAL LOW (ref 17.0–34.0)

## 2012-11-12 LAB — PHOSPHORUS: Phosphorus: 2.9 mg/dL (ref 2.3–4.6)

## 2012-11-12 MED ORDER — PROCHLORPERAZINE EDISYLATE 5 MG/ML IJ SOLN
10.0000 mg | Freq: Four times a day (QID) | INTRAMUSCULAR | Status: DC | PRN
  Administered 2012-11-12 – 2012-11-13 (×3): 10 mg via INTRAVENOUS
  Filled 2012-11-12 (×3): qty 2

## 2012-11-12 MED ORDER — POTASSIUM CHLORIDE 10 MEQ/100ML IV SOLN
10.0000 meq | INTRAVENOUS | Status: AC
  Administered 2012-11-12 (×3): 10 meq via INTRAVENOUS
  Filled 2012-11-12 (×4): qty 100

## 2012-11-12 MED ORDER — CLINIMIX E/DEXTROSE (5/15) 5 % IV SOLN
INTRAVENOUS | Status: DC
  Filled 2012-11-12: qty 2000

## 2012-11-12 MED ORDER — INSULIN ASPART 100 UNIT/ML ~~LOC~~ SOLN: 0.0000 [IU] | Freq: Four times a day (QID) | SUBCUTANEOUS | Status: DC

## 2012-11-12 MED ORDER — INSULIN ASPART 100 UNIT/ML ~~LOC~~ SOLN
0.0000 [IU] | SUBCUTANEOUS | Status: DC
Start: 2012-11-12 — End: 2012-11-13
  Administered 2012-11-12: 3 [IU] via SUBCUTANEOUS
  Administered 2012-11-12 – 2012-11-13 (×2): 2 [IU] via SUBCUTANEOUS
  Administered 2012-11-13: 1 [IU] via SUBCUTANEOUS
  Administered 2012-11-13: 2 [IU] via SUBCUTANEOUS

## 2012-11-12 MED ORDER — CLINIMIX E/DEXTROSE (5/15) 5 % IV SOLN
INTRAVENOUS | Status: DC
  Administered 2012-11-12: 18:00:00 via INTRAVENOUS
  Filled 2012-11-12: qty 2000

## 2012-11-12 MED ORDER — SALINE SPRAY 0.65 % NA SOLN
1.0000 | NASAL | Status: DC | PRN
  Administered 2012-11-12: 1 via NASAL
  Filled 2012-11-12: qty 44

## 2012-11-12 MED ORDER — TRACE MINERALS CR-CU-F-FE-I-MN-MO-SE-ZN IV SOLN
INTRAVENOUS | Status: DC
  Filled 2012-11-12: qty 2000

## 2012-11-12 MED ORDER — POTASSIUM CHLORIDE 20 MEQ/15ML (10%) PO LIQD
20.0000 meq | Freq: Once | ORAL | Status: AC
  Administered 2012-11-12: 20 meq via ORAL
  Filled 2012-11-12: qty 15

## 2012-11-12 NOTE — Progress Notes (Signed)
Physical Therapy Treatment Patient Details Name: [name] [name] MRN: [id number] DOB: 1940/10/04 Today's Date: 11/12/2012 Time: 1425-1440 PT Time Calculation (min): 15 min  PT Assessment / Plan / Recommendation Comments on Treatment Session  Pt was extremely tired upon PT arrival,  she was willing and able to ambulate down the hall. She ambulated well,  her gait speed was decreased due to fatigue.    Follow Up Recommendations  No PT follow up     Equipment Recommendations  Rolling walker with 5" wheels    Frequency Min 3X/week   Plan Discharge plan remains appropriate    Precautions / Restrictions Precautions Precautions: Fall Restrictions Weight Bearing Restrictions: No   Pertinent Vitals/Pain Pt denies pain. Her O2 sats (on 2L O2) were 95% at rest, and between 86% and 95% during gait (rising after pursed lip breathing).    Mobility  Bed Mobility Bed Mobility: Supine to Sit;Sitting - Scoot to Edge of Bed Supine to Sit: 4: Min assist Sitting - Scoot to [place] of Bed: 4: Min assist Details for Bed Mobility Assistance: some trunk assistance to get to upright at edge Transfers Transfers: Sit to Stand;Stand to Sit Sit to Stand: 4: Min guard;From bed Stand to Sit: To chair/3-in-1;4: Min guard;With upper extremity assist Ambulation/Gait Ambulation/Gait Assistance: 4: Min guard;4: Min assist Ambulation Distance (Feet): 60 Feet Assistive device: Rolling walker Ambulation/Gait Assistance Details: some tactile cuing to steer RW, pt had some difficulty keeping it straight. Gait Pattern: Step-through pattern Gait velocity: Decreased Wheelchair Mobility Wheelchair Mobility: No        PT Goals Acute Rehab PT Goals PT Goal Formulation: With patient/family Time For Goal Achievement: 11/24/12 Potential to Achieve Goals: Good Pt will go Supine/Side to Sit: Independently PT Goal: Supine/Side to Sit - Progress: Progressing toward goal Pt will go Sit to Stand: with modified  independence PT Goal: Sit to Stand - Progress: Progressing toward goal Pt will go Stand to Sit: with modified independence PT Goal: Stand to Sit - Progress: Progressing toward goal Pt will Ambulate: >150 feet;with modified independence;with least restrictive assistive device PT Goal: Ambulate - Progress: Progressing toward goal  Visit Information  Last PT Received On: 11/12/12 Assistance Needed: +1    Cognition    Good   Balance   Good  End of Session PT - End of Session Equipment Utilized During Treatment: Gait belt;Oxygen Activity Tolerance: Patient tolerated treatment well;Patient limited by fatigue Patient left: in chair;with call bell/phone within reach   GP     BROWN-SMEDLEY, [name] [name] 11/12/2012, 3:44 PM  [name] [name]  PTA [facility]  Acute  Rehab Pager      [phone]

## 2012-11-12 NOTE — Progress Notes (Signed)
Patient ID: [name] [name], female   DOB: Jun 04, 1941, 72 y.o.   MRN: [id number]  [facility] PROGRESS NOTE  YOMAIRA SOLAR NWG:[id number] DOB: 1940/12/01 DOA: 11/02/2012 PCP: Laurette Schimke, MD  Brief HPI:  Pt is a 72 y.o. 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 In the ED, she was found to have early or partial SBO on CT abdomen. Hospitalist was asked to admit for further evaluation.   Active Problems:   Partial small bowel obstruction  Stable. Patient tolerating small quantities and passing soft stools. SBO most likely due to cancer. Since she is not meeting nutritional requirement TNA was initiated. Magic mouthwash initiated for mouth pain. Continue supportive care, analgesia and antiemetics as needed.   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 Ascites being drained periodically.   Anemia of chronic disease  Hgb stable.   Hypokalemia and Hypomagnesemia  Potassium to be repleted via TNA. Mg is normal today.   Severe malnutrition TNA and oral intake. Hopefully as SBO improves oral intake will improve and TNA can be tapered down.  Diarrhea  C. Diff by PCR negative, continue Imodium PRN.  Tachycardia  Rate better controlled, continue Atenolol.  Vaginal yeast infection  Continue Fluconazole till discharge.  Code Status: Full  Family Communication: Pt and daughter's at bedside  Disposition Plan: Anticipate discharge 4/11. Discussed with Dr. Darnelle Catalan.    Discussed with patient and her family. Her husband was very belligerent towards me. This is the first time I am meeting him. Unclear why he is so upset. I tried to address this with him but he refuses to talk to me. He has a threatening demeanor. So after answering the patient's question I  walked out of the room as demanded by the husband. ----------------------------------------------------------------------------------------------------------------------------------  Consultants:  Oncology   Antibiotics:  Fluconazole 04/06 -->   HPI/Subjective: Patient feels same. Tolerating orally. Pain in abdomen is reasonably well controlled. No vomiting or diarrhea.  Objective: Filed Vitals:   11/11/12 2039 11/12/12 0025 11/12/12 0431 11/12/12 0830  BP:  110/53 103/56   Pulse:  103 105   Temp:   98.7 F (37.1 C)   TempSrc:   Oral   Resp:   16   Height:      Weight:      SpO2: 96%  96% 92%    Intake/Output Summary (Last 24 hours) at 11/12/12 0918 Last data filed at 11/12/12 0433  Gross per 24 hour  Intake    800 ml  Output    500 ml  Net    300 ml    Exam:   General:  Pt is alert, follows commands appropriately, not in acute distress  Cardiovascular: Regular rate and rhythm, S1/S2, no murmurs, no rubs, no gallops  Respiratory: Clear to auscultation bilaterally, no wheezing, no crackles, no rhonchi  Abdomen: Soft, slightly tender diffusely, slightly distended, bowel sounds present, no guarding  Extremities: No edema, pulses DP and PT palpable bilaterally  Neuro: Grossly nonfocal  Data Reviewed: Basic Metabolic Panel:  Recent Labs Lab 11/06/12 0515 11/07/12 0410 11/10/12 0520 11/11/12 0600 11/12/12 0426  NA 137 138 139 136 134*  K 3.4* 3.4* 2.8* 3.4* 3.4*  CL 104 105 111 103 101  CO2 25 25 22 24 26   GLUCOSE 106* 109* 72 76 215*  BUN  5* 5* 6 6 8   CREATININE 0.62 0.64 0.52 0.61 0.57  CALCIUM 7.8* 7.8* 6.4* 7.5* 7.6*  MG  --   --   --  1.1* 1.7  PHOS  --   --   --   --  2.9   CBC:  Recent Labs Lab 11/06/12 0515 11/07/12 0410 11/10/12 0520 11/11/12 0600 11/12/12 0426  WBC 9.3 8.2 8.6 10.9* 11.7*  NEUTROABS  --   --   --   --  8.5*  HGB 8.8* 9.0* 7.8* 9.5* 9.2*  HCT 26.3* 26.1* 22.5* 27.4* 27.3*  MCV 88.6 88.2 88.2 87.8 87.5  PLT 640*  652* 546* 639* 646*   CBG:  Recent Labs Lab 11/09/12 0846 11/09/12 1438 11/10/12 0722 11/11/12 0807 11/12/12 0737  GLUCAP 65* 82 93 90 184*    Recent Results (from the past 240 hour(s))  CULTURE, BLOOD (ROUTINE X 2)     Status: None   Collection Time    11/03/12  1:40 AM      Result Value Range Status   Specimen Description BLOOD RIGHT ARM   Final   Special Requests BOTTLES DRAWN AEROBIC AND ANAEROBIC 3CC   Final   Culture  Setup Time 11/03/2012 08:41   Final   Culture NO GROWTH 5 DAYS   Final   Report Status 11/09/2012 FINAL   Final  CULTURE, BLOOD (ROUTINE X 2)     Status: None   Collection Time    11/03/12  1:45 AM      Result Value Range Status   Specimen Description BLOOD PORT   Final   Special Requests BOTTLES DRAWN AEROBIC AND ANAEROBIC 3CC   Final   Culture  Setup Time 11/03/2012 08:41   Final   Culture NO GROWTH 5 DAYS   Final   Report Status 11/09/2012 FINAL   Final  CLOSTRIDIUM DIFFICILE BY PCR     Status: None   Collection Time    11/07/12  8:39 AM      Result Value Range Status   C difficile by pcr NEGATIVE  NEGATIVE Final     Scheduled Meds: . ipratropium  0.5 mg Nebulization QID   And  . albuterol  2.5 mg Nebulization QID  . antiseptic oral rinse  15 mL Mouth Rinse q12n4p  . atenolol  12.5 mg Oral BID  . budesonide-formoterol  2 puff Inhalation BID  . buPROPion  150 mg Oral BID  . busPIRone  15 mg Oral BID  . clopidogrel  75 mg Oral Q breakfast  . enoxaparin (LOVENOX) injection  40 mg Subcutaneous Q24H  . fluconazole  100 mg Oral Daily  . insulin aspart  0-9 Units Subcutaneous Q6H  . pantoprazole (PROTONIX) IV  40 mg Intravenous Q12H  . potassium chloride  10 mEq Intravenous Q1 Hr x 4  . sertraline  100 mg Oral QAC breakfast   Continuous Infusions: . dextrose 5 % and 0.9% NaCl 10 mL/hr at 11/11/12 1800  . Marland KitchenTPN (CLINIMIX-E) Adult 40 mL/hr at 11/11/12 1749   And  . fat emulsion 240 mL (11/11/12 1749)   Osvaldo Shipper, MD  [facility] Pager  [phone]  If 7PM-7AM, please contact night-coverage [email or web address] Password [email or web address] 11/12/2012, 9:18 AM   LOS: 10 days

## 2012-11-12 NOTE — Progress Notes (Signed)
[name] [name]   DOB:03/31/1941   ZO#:[id number]   JWJ#:[id number]  Subjective: Met with patient, husband, daughters [name] [name] and [name] [name] for c. 1 hour. Somehow they had understood discussion w MD yesterday to imply that pt needed SNF and no more chemo. 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 Her husband [name] [name] runs a Psychologist, sport and exercise out of their own home. Daughter [name] [name], 54, currently lives at home with the patient. There are some issues relating to this daughter that the patient discussed briefly. Dauhter. [name] [name], 30, is an ED nurse at [employer]. The patient has no grandchildren. She is not a Advice worker.   ADVANCED DIRECTIVES: in place; husband is HHPOA  GYNECOLOGIC HISTORY:  She had menarche age 66. First pregnancy to term was at age 30. She is GX P2. She became menopausal at the time of her chemotherapy for breast cancer. This was age 72. She never took hormone replacement therapy  FAMILY HISTORY: the patient's father died at the age of 36. The patient's mother died at the age of 77; she had a history of colon cancer and endometrial cancer. The patient has no sisters. She has one brother, with a history of colon cancer. There is no history of breast cancer in the family. The patient is scheduled for genetic testing later this month.  Objective: middle aged white woman examined in bed Filed Vitals:   11/12/12 0431  BP: 103/56  Pulse: 105  Temp: 98.7 F (37.1 C)  Resp: 16    Body mass index is 23.11 kg/(m^2).  Intake/Output Summary (Last 24 hours) at 11/12/12 0810 Last data filed at 11/12/12 0433  Gross per 24 hour  Intake    800 ml  Output    500 ml  Net    300 ml     Sclerae unicteric  Abdomen distended but soft and NT, decreased BS, drainage  catheter in place  MSK  no peripheral edema  Neuro well oriented, appropriate affect affect  Breast exam: deferred  CBG (last 3)   Recent Labs  11/10/12 0722 11/11/12 0807 11/12/12 0737  GLUCAP 93 90 184*     Labs:  Results for [name], [name] (MRN [id number]) as of 11/04/2012 08:13  Ref. Range 06/17/2012 10:12 07/06/2012 12:37 09/21/2012 11:50 10/06/2012 11:49 10/27/2012 10:51  CA 125 Latest Range: 0.0-30.2 U/mL 33.3 (H) 35.7 (H) 879.3 (H) 1066.3 (H) 599.7 (H)    Lab Results  Component Value Date   WBC 11.7* 11/12/2012   HGB 9.2* 11/12/2012   HCT 27.3* 11/12/2012   MCV 87.5 11/12/2012   PLT 646* 11/12/2012   NEUTROABS 8.5* 11/12/2012    @LASTCHEMISTRY @  Urine Studies No results found for this basename: UACOL, UAPR, USPG, UPH, UTP, UGL, UKET, UBIL, UHGB, UNIT, UROB, ULEU, UEPI, UWBC, URBC, UBAC, CAST, CRYS, UCOM, BILUA,  in the last 72 hours  Basic Metabolic Panel:  Recent Labs Lab 11/06/12 0515 11/07/12 0410 11/10/12 0520 11/11/12 0600 11/12/12 0426  NA 137 138 139 136 134*  K 3.4* 3.4* 2.8* 3.4* 3.4*  CL 104 105 111 103 101  CO2 25 25 22 24 26   GLUCOSE 106* 109* 72 76 215*  BUN 5* 5* 6 6 8   CREATININE 0.62 0.64 0.52 0.61 0.57  CALCIUM 7.8* 7.8* 6.4* 7.5* 7.6*  MG  --   --   --  1.1* 1.7  PHOS  --   --   --   --  2.9   GFR Estimated Creatinine Clearance: 57.3 ml/min (by C-G formula based on Cr of 0.57). Liver Function Tests:  Recent Labs Lab 11/12/12 0426  AST 10  ALT 7  ALKPHOS 79  BILITOT 0.1*  PROT 4.7*  ALBUMIN 1.3*   No results found for this basename: LIPASE, AMYLASE,  in the last 168 hours No results found for this basename: AMMONIA,  in the last 168 hours Coagulation profile No results found for this basename: INR, PROTIME,  in the last 168 hours  CBC:  Recent Labs Lab 11/06/12 0515 11/07/12 0410 11/10/12 0520 11/11/12 0600 11/12/12 0426  WBC 9.3 8.2 8.6 10.9* 11.7*  NEUTROABS  --   --   --   --  8.5*  HGB 8.8* 9.0* 7.8* 9.5* 9.2*   HCT 26.3* 26.1* 22.5* 27.4* 27.3*  MCV 88.6 88.2 88.2 87.8 87.5  PLT 640* 652* 546* 639* 646*   Cardiac Enzymes: No results found for this basename: CKTOTAL, CKMB, CKMBINDEX, TROPONINI,  in the last 168 hours BNP: No components found with this basename: POCBNP,  CBG:  Recent Labs Lab 11/09/12 0846 11/09/12 1438 11/10/12 0722 11/11/12 0807 11/12/12 0737  GLUCAP 65* 82 93 90 184*   D-Dimer No results found for this basename: DDIMER,  in the last 72 hours Hgb A1c No results found for this basename: HGBA1C,  in the last 72 hours Lipid Profile  Recent Labs  11/12/12 0426  CHOL 118  TRIG 115   Thyroid function studies No results found for this basename: TSH, T4TOTAL, FREET3, T3FREE, THYROIDAB,  in the last 72 hours Anemia work up No results found for this basename: VITAMINB12, FOLATE, FERRITIN, TIBC, IRON, RETICCTPCT,  in the last 72 hours Microbiology Recent Results (from the past 240 hour(s))  CULTURE, BLOOD (ROUTINE X 2)     Status: None   Collection Time    11/03/12  1:40 AM      Result Value Range Status   Specimen Description BLOOD RIGHT ARM   Final   Special Requests BOTTLES DRAWN AEROBIC AND ANAEROBIC 3CC   Final   Culture  Setup Time 11/03/2012 08:41   Final   Culture NO GROWTH 5 DAYS   Final   Report Status 11/09/2012 FINAL   Final  CULTURE, BLOOD (ROUTINE X 2)     Status: None   Collection Time    11/03/12  1:45 AM      Result Value Range Status   Specimen Description BLOOD PORT   Final   Special Requests BOTTLES DRAWN AEROBIC AND ANAEROBIC 3CC   Final   Culture  Setup Time 11/03/2012 08:41   Final   Culture NO GROWTH 5 DAYS   Final   Report Status 11/09/2012 FINAL   Final  CLOSTRIDIUM DIFFICILE BY PCR     Status: None   Collection Time    11/07/12  8:39 AM      Result Value Range Status   C difficile by pcr NEGATIVE  NEGATIVE Final      Studies:  Dg Abd 1 View  11/07/2012  *RADIOLOGY REPORT*  Clinical Data: Follow-up partial small bowel  obstruction.  Nausea, vomiting.  ABDOMEN - 1 VIEW  Comparison: 04/02/2014and CT 11/02/2012  Findings: The gastric wall appears thickened.  There is separation of bowel loops, consistent with known carcinomatosis.  The small bowel loops  in the central abdomen appears thick-walled and dilated. There is gas distally in the rectosigmoid colon, excluding complete obstruction.  Degenerative changes are seen in the spine.  IMPRESSION: Dilated and thick-walled small bowel loops, consistent with partial small bowel obstruction.  Separation of bowel loops is consistent with known carcinomatosis.   Original Report Authenticated By: Norva Pavlov, M.D.    Ct Abdomen Pelvis W Contrast  11/02/2012  *RADIOLOGY REPORT*  Clinical Data: Abdominal pain.  CT ABDOMEN AND PELVIS WITH CONTRAST  Technique:  Multidetector CT imaging of the abdomen and pelvis was performed following the standard protocol during bolus administration of intravenous contrast.  Contrast: 50mL OMNIPAQUE IOHEXOL 300 MG/ML  SOLN, OMNIPAQUE IOHEXOL 300 MG/ML  SOLN  Comparison: CT abdomen and pelvis 09/24/2012  Findings: The lung bases are clear.  Diffuse infiltration and nodularity in the omentum and mesentery as well as and peritoneal compartments consistent with peritoneal carcinomatosis and appearing relatively stable since the previous study.  There is upper abdominal ascites which has improved since prior study.  The gallbladder is distended, similar to prior study. No bile duct dilatation.  The liver, spleen, pancreas, adrenal glands, kidneys, inferior vena cava, and retroperitoneal lymph nodes are unremarkable.  There is diffuse calcification of the abdominal aorta without aneurysm.  The stomach, the stomach is not abnormally distended.  Mild dilatation of the distal abdominal small bowel, new since previous study with a few air-fluid levels and some bowel wall thickening.  Terminal ileum is decompressed. Changes suggest obstruction, likely due to  adhesions although a neoplastic involvement is not excluded.  Transition zone appears to be in the upper pelvis.  Right lower quadrant drainage catheter is in place.  No free air.  Pelvis:  Stool filled colon is not abnormally distended.  No colonic wall thickening.  Appendix is not appreciated.  Small amount of free fluid in the pelvis.  A heterogeneously enhancing nodules in the lower pelvis consistent with metastasis and stable since previous study.  Bladder wall is not thickened.  Degenerative changes in the lumbar spine.  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 Abdominal ascites is improved since previous study with drainage catheter in place.  Interval development of mild small bowel dilatation with air-fluid levels suggesting early or partial obstruction.   Original Report Authenticated By: Burman Nieves, M.D.    Nm Hepato W/eject Fract  10/20/2012  *RADIOLOGY REPORT*  Clinical Data: Abdominal pain; no cholelithiasis  NUCLEAR MEDICINE HEPATOBILIARY WITH GB  Views:  Anterior right upper quadrant  Radiopharmaceutical:   Technetium 15m Choletec  Dose:  5.5 mCi  Route of administration: Intravenous  Findings: Liver uptake of radiotracer is normal.  There is prompt visualization of small bowel, consistent with patency of the common bile duct.  Gallbladder does not visualize during the course of this study, even after administration of 2.4 mg of morphine with an additional administration of 1.5 mCi of technetium 58m Choletec.  IMPRESSION: Nonvisualization of gallbladder despite administration of intravenous morphine.  This finding raises concern for cystic duct obstruction/acute cholecystitis .  Common bile duct is patent as is evidenced by prompt visualization of small bowel.   Original Report Authenticated By: Bretta Bang, M.D.    US Paracentesis  10/22/2012  *RADIOLOGY REPORT*  Clinical Data: Ovarian cancer with recurrent malignant ascites  ULTRASOUND GUIDED  PARACENTESIS  Comparison:  Previous paracentesis  An ultrasound guided paracentesis was thoroughly discussed with the patient and questions answered.  The benefits, risks, alternatives and complications were also discussed.  The patient understands and wishes to proceed with the procedure.  Written consent was obtained.  Ultrasound was performed to localize and mark an adequate pocket of fluid in the left lateral quadrant of the abdomen.  The area was then prepped and draped in the normal sterile fashion.  1% Lidocaine was used for local anesthesia.  Under ultrasound guidance a 19 gauge Yueh catheter was introduced.  Paracentesis was performed.  The catheter was removed and a dressing applied.  Complications:  None  Findings:  A total of approximately 2.5 liters of clear yellow fluid was removed.  A fluid sample was not sent for laboratory analysis.  IMPRESSION: Successful ultrasound guided paracentesis yielding 2.5 liters of ascites. Small-volume ascites left behind as residual, as the patient is scheduled for tunneled peritoneal drainage catheter next week.  Read by Brayton El PA-C   Original Report Authenticated By: Malachy Moan, M.D.    US Paracentesis  10/13/2012  *RADIOLOGY REPORT*  Clinical Data: History of ovarian cancer with recurrent ascites  ULTRASOUND GUIDED PARACENTESIS  Comparison:  Previous paracentesis  An ultrasound guided paracentesis was thoroughly discussed with the patient and questions answered.  The benefits, risks, alternatives and complications were also discussed.  The patient understands and wishes to proceed with the procedure.  Written consent was obtained.  Ultrasound was performed to localize and mark an adequate pocket of fluid in the left lateral quadrant of the abdomen.  The area was then prepped and draped in the normal sterile fashion.  1% Lidocaine was used for local anesthesia.  Under ultrasound guidance a 19 gauge Yueh catheter was introduced.  Paracentesis was  performed.  The catheter was removed and a dressing applied.  Complications:  None  Findings:  A total of approximately 2.7 liters of dark yellow fluid was removed.  A fluid sample was not sent for laboratory analysis.  IMPRESSION: Successful ultrasound guided paracentesis yielding 2.7 liters of ascites.  Read by Brayton El PA-C   Original Report Authenticated By: Judie Petit. Miles Costain, M.D.    Ir Perc Noralee Stain Perit Cath [facility]  10/30/2012  *RADIOLOGY REPORT*  Clinical Data:  Ovarian carcinoma and recurrent ascites requiring multiple paracentesis procedures.  INSERTION OF TUNNELED PERITONEAL DRAINAGE CATHETER  Sedation: 2.5 mg IV Versed; 125 mcg IV Fentanyl.  Total Moderate Sedation Time: 19 minutes.  Additional Medications:  1 gram IV Ancef.  As antibiotic prophylaxis, Ancef  was ordered pre-procedure and administered intravenously within one hour of incision.  Fluoroscopy Time: 0.4 minutes.  Procedure:  The procedure, risks, benefits, and alternatives were explained to the patient.  Questions regarding the procedure were encouraged and answered.  The patient understands and consents to the procedure.  The right abdominal wall was prepped with Betadine in a sterile fashion, and a sterile drape was applied covering the operative field.  A sterile gown and sterile gloves were used for the procedure. Local anesthesia was provided with 1% Lidocaine. Ultrasound image documentation was performed.  Fluoroscopy during the procedure and fluoroscopic spot radiograph confirms appropriate catheter position.  After creating a small skin incision, a 19 gauge needle was advanced into the peritoneal cavity under ultrasound guidance.  A guide wire was then advanced under fluoroscopy into the peritoneal cavity.  Peritoneal access was dilated serially and a 16-French peel-away sheath placed.  A 16 French tunneled Bard Aspira catheter was placed.  This was tunneled from an incision 5 cm below the peritoneal access to the access site.  The  catheter was advanced through the peel-away  sheath.  The sheath was then removed.  Final catheter positioning was confirmed with a fluoroscopic spot image.  The peritoneal access incision was closed with subcutaneous 3-0 Monocryl and subcuticular 4-0 Vicryl.  Dermabond was applied to the incision.  A Prolene retention suture was applied at the catheter exit site.  Large volume paracentesis was performed through the new catheter utilizing drainage bottles.  Complications:  None  Findings:  The catheter was placed via the right abdominal wall. Catheter course is towards the right upper quadrant.  Approximately 2.4 liters of ascites was able to be removed after catheter placement.  IMPRESSION:  Placement of tunneled peritoneal drainage catheter via right abdominal approach.  2.4 liters of ascites was removed today after catheter placement.   Original Report Authenticated By: Irish Lack, M.D.    Dg Abd 2 Views  11/04/2012  *RADIOLOGY REPORT*  Clinical Data: Nausea vomiting.  Follow up small bowel obstruction  ABDOMEN - 2 VIEW  Comparison: CT 11/02/2012  Findings: Peritoneal catheter from right-sided approach a crosses the midline into the left pelvis.  Small bowel loops are mildly dilated and are unchanged from the recent CT.  No free air is identified.  Colon is decompressed and contains  contrast from prior CT.  IMPRESSION: Mild small bowel dilatation is unchanged and may relate to small bowel obstruction.  No free air.   Original Report Authenticated By: Janeece Riggers, M.D.     Assessment: 72 y.o. 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 [V323G (1196T>G)] , history of breast cancer (resolved) and ovarian cancer (active), admitted with partial SBO (1) status post left modified radical mastectomy October 1993 for a T2 N0 invasive ductal breast, estrogen and progesterone receptor positive cancer treated adjuvantly with MFL (methotrexate/fluorouracil/leucovorin)  chemotherapy x6, completed April 1994, followed by 5 years of tamoxifen completed April 1999, with no evidence of recurrence  (2) now status post TAH BSO, omentectomy, and suboptimal debulking 10/29/2011 for a high-grade serous adenocarcinoma of the ovary, pT3c NX (Stage IIIC) , s/p eight cycles of carboplatin/paclitaxel completed 05/05/2012 (initial CA125 of 2050.7 normalized after cycle 5)  (3) recurrent disease documented by rising CEA 125; an abdominal CT scan 09/21/2012 showed  widespread intraperitoneal metastases and a moderate volume of malignant ascites (cytologically positive).  Has received 2 cycles of liposomal doxorubicin, with CA-125 evidence of early response (4) COPD with ongoing tobacco abuse  (5) diabetes mellitus  (6) genetic testing for Lynch syndrome pending.  (7) status post hospitalization in July 2013 for stroke/small acute infarcts in the left sub insular white matter and coronal radiata.  (8) port in place  (9) left upper lobe 6 mm subpleural nodule, without associated hypermetabolic activity (likely well below the resolution of PET imaging) noted August 2013  (10) cholecystitis, with cholecystectomy pending significant improvement in patient's functional status (11) recurring ascites, requiring therapeutic paracentesis, s/p placement of Aspira drain 10/29/2012 (12) partial SBO: not resolving with conservative management; TNA started 11/11/2012  Plan: The patient and family understand that recurrent ovarian cancer is not curable but is treatable and the course is generally one of remissins and relapses; at some point the decision is made to switch to palliative/ comfort care but we are not there today. The plan is to continue Doxil another 2-4 cycles and reassess; next dose 4/15 They understand TNA may feed the cancer more than the patient so it is a temporizing measure, not a permanent solution. It will provide her w nutrition and  hydration for the next few weeks while the  SBO hopefully resolves. TNA of course precvludes SNF and is generally done "cyclically" (between 6 PM and 8 AM nightly) through [facility]. It will allow her to leave the hospital (hopefully Friday if all in place) and be home with family while she hopefully recuperated. Jeniece's role in her therapy is to participate in OT/PT, stay OOB during the day and walk as much as she can; she can try to hydrate herself po as tolerated; and she has quit smoking, which is great (will help chemo work better)  Greatly appreciate hospitalists' help to this patient and family! Will follow with you.   MAGRINAT,GUSTAV C 11/12/2012

## 2012-11-12 NOTE — Progress Notes (Signed)
Pt very drowsy today. She has been napping on and off, always arousable when sleeping. Pt is complaining of no pain or nausea, and is not agreeable to having aspira drainage done today. Will continue to monitor and do drainage if pt changes mind.

## 2012-11-12 NOTE — Progress Notes (Signed)
Pt was to receive four runs of potassium via IV today. After the third run, her IV infiltrated and as she has been stuck multiple times in right arm and has an extremity precaution on her left arm, MD decided to order PO K for the last dose of K. Will continue to monitor.

## 2012-11-12 NOTE — Progress Notes (Deleted)
Physical Therapy Treatment Patient Details Name: [name] [name] MRN: [id number] DOB: Aug 02, 1941 Today's Date: 11/12/2012 Time: 4098-1191 PT Time Calculation (min): 35 min  PT Assessment / Plan / Recommendation Comments on Treatment Session  Pt ambulated with a RW for 300'. She tolerated treatment well and moved confidently and safely.    Follow Up Recommendations  No PT follow up     Does the patient have the potential to tolerate intense rehabilitation     Barriers to Discharge        Equipment Recommendations  Rolling walker with 5" wheels    Recommendations for Other Services    Frequency Min 3X/week   Plan Discharge plan remains appropriate    Precautions / Restrictions Precautions Precautions: Fall Required Braces or Orthoses: Knee Immobilizer - Right Knee Immobilizer - Right: On when out of bed or walking Restrictions Weight Bearing Restrictions: No   Pertinent Vitals/Pain Pt stated that her R knee was sore but did not rate, before and after exercise, as well as during gait. During exercise, she rated her pain as 6/10.    Mobility  Bed Mobility Bed Mobility: Supine to Sit;Sitting - Scoot to Edge of Bed Supine to Sit: 4: Min assist Sitting - Scoot to [place] of Bed: 4: Min guard Details for Bed Mobility Assistance: Some R LE assistance for safety Transfers Transfers: Sit to Stand;Stand to Sit Sit to Stand: 4: Min guard;From bed Stand to Sit: 4: Min guard;To chair/3-in-1 Ambulation/Gait Ambulation/Gait Assistance: 4: Min guard Ambulation Distance (Feet): 300 Feet Assistive device: Rolling walker Gait Pattern: Step-through pattern Gait velocity: WFL Stairs: No Wheelchair Mobility Wheelchair Mobility: No    Exercises Total Joint Exercises Ankle Circles/Pumps: AROM;Both;10 reps;Supine Quad Sets: AROM;Right;10 reps;Supine Towel Squeeze: AROM;Right;10 reps;Supine Short Arc Quad: AROM;Right;10 reps;Supine Heel Slides: AAROM;Right;10 reps;Supine Hip  ABduction/ADduction: AROM;Right;10 reps;Supine Straight Leg Raises: AAROM;Right;10 reps;Supine        PT Goals Acute Rehab PT Goals PT Goal Formulation: With patient/family Time For Goal Achievement: 11/24/12 Potential to Achieve Goals: Good Pt will go Supine/Side to Sit: Independently PT Goal: Supine/Side to Sit - Progress: Progressing toward goal Pt will go Sit to Supine/Side: Independently PT Goal: Sit to Supine/Side - Progress: Progressing toward goal Pt will go Sit to Stand: with modified independence PT Goal: Sit to Stand - Progress: Progressing toward goal Pt will go Stand to Sit: with modified independence PT Goal: Stand to Sit - Progress: Progressing toward goal Pt will Perform Home Exercise Program: Independently PT Goal: Perform Home Exercise Program - Progress: Progressing toward goal  Visit Information  Last PT Received On: 11/12/12 Assistance Needed: +1    Cognition    Good   Balance   Good  End of Session PT - End of Session Equipment Utilized During Treatment: Gait belt;Right knee immobilizer Activity Tolerance: Patient tolerated treatment well Patient left: in chair;with call bell/phone within reach   GP     BROWN-SMEDLEY, Eleanor Gatliff 11/12/2012, 1:11 PM

## 2012-11-12 NOTE — Progress Notes (Addendum)
PARENTERAL NUTRITION CONSULT NOTE - Follow Up  Pharmacy Consult for  TNA Indication: SBO  No Known Allergies  Patient Measurements: Height: 5\' 4"  (162.6 cm) Weight: 134 lb 11.2 oz (61.1 kg) IBW/kg (Calculated) : 54.7 Adjusted Body Weight: 57kg Usual Weight:   Vital Signs: Temp: 98.7 F (37.1 C) (04/10 0431) Temp src: Oral (04/10 0431) BP: 103/56 mmHg (04/10 0431) Pulse Rate: 105 (04/10 0431) Intake/Output from previous day: 04/09 0701 - 04/10 0700 In: 920 [P.O.:420; I.V.:400; IV Piggyback:100] Out: 500  Intake/Output from this shift:    Labs:  Recent Labs  11/10/12 0520 11/11/12 0600 11/12/12 0426  WBC 8.6 10.9* 11.7*  HGB 7.8* 9.5* 9.2*  HCT 22.5* 27.4* 27.3*  PLT 546* 639* 646*     Recent Labs  11/10/12 0520 11/11/12 0600 11/12/12 0426  NA 139 136 134*  K 2.8* 3.4* 3.4*  CL 111 103 101  CO2 22 24 26   GLUCOSE 72 76 215*  BUN 6 6 8   CREATININE 0.52 0.61 0.57  CALCIUM 6.4* 7.5* 7.6*  MG  --  1.1* 1.7  PHOS  --   --  2.9  PROT  --   --  4.7*  ALBUMIN  --   --  1.3*  AST  --   --  10  ALT  --   --  7  ALKPHOS  --   --  79  BILITOT  --   --  0.1*  TRIG  --   --  115  CHOL  --   --  118   Estimated Creatinine Clearance: 57.3 ml/min (by C-G formula based on Cr of 0.57).    Recent Labs  11/10/12 0722 11/11/12 0807 11/12/12 0737  GLUCAP 93 90 184*    Medical History: Past Medical History  Diagnosis Date  . History of breast cancer   . COPD (chronic obstructive pulmonary disease)   . GERD (gastroesophageal reflux disease)   . Diabetes mellitus     type II  . Hx of colonic polyps   . Osteoarthritis   . MVP (mitral valve prolapse)   . Dysrhythmia     pt states runs a little fast   . Shortness of breath     with exertion   . Depression   . Anxiety   . Heart murmur   . Cancer     left breast  . Ovarian cancer     active chemotherapy  . Stroke   . Ascites 10/22/2012    Medications:  Scheduled:  . ipratropium  0.5 mg Nebulization  QID   And  . albuterol  2.5 mg Nebulization QID  . antiseptic oral rinse  15 mL Mouth Rinse q12n4p  . atenolol  12.5 mg Oral BID  . budesonide-formoterol  2 puff Inhalation BID  . buPROPion  150 mg Oral BID  . busPIRone  15 mg Oral BID  . clopidogrel  75 mg Oral Q breakfast  . enoxaparin (LOVENOX) injection  40 mg Subcutaneous Q24H  . fluconazole  100 mg Oral Daily  . [COMPLETED] magnesium sulfate 1 - 4 g bolus IVPB  2 g Intravenous Once  . pantoprazole (PROTONIX) IV  40 mg Intravenous Q12H  . sertraline  100 mg Oral QAC breakfast  . [DISCONTINUED] pantoprazole (PROTONIX) IV  80 mg Intravenous Q24H   Infusions:  . dextrose 5 % and 0.9% NaCl 10 mL/hr at 11/11/12 1800  . [EXPIRED] dextrose 5 % and 0.9% NaCl 50 mL/hr at 11/11/12 1300  . Marland Kitchen  TPN (CLINIMIX-E) Adult 40 mL/hr at 11/11/12 1749   And  . fat emulsion 240 mL (11/11/12 1749)  . [DISCONTINUED] dextrose 5 % and 0.9% NaCl 50 mL/hr at 11/11/12 1016   PRN: albuterol, diazepam, Ipratropium-Albuterol, lidocaine-prilocaine, loperamide, LORazepam, magic mouthwash w/lidocaine, morphine injection, ondansetron (ZOFRAN) IV, oxyCODONE, prochlorperazine, [DISCONTINUED] promethazine  Insulin Requirements in the past 24 hours:  none - no SSI ordered  Nutritional Goals:  RD recs: 1550-1850 kcal, 80-90g protein, 1.5-1.8L/day Clinimix E 5/15  at a goal rate of 60ml/hr + IVFE 20% at 25ml/hr on MWF to provide: 84g/day protein, 1398Kcal/day avg.(1193Kcal/day STTHS, 1672Kcal/day MWF).  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 She was found to have early or partial SBO per CT.  Has failed to tolerate advancement of diet so TNA per pharmacy management to start 4/9  Glucose/CBG - 184 this AM  Electrolytes - K slightly low, other lytes  ok  LFTs - wnl 4/10  TGs - WNL 4/10  Prealbumin - pending  Plan: at 1800:  Increase Clinimix E 5/15% to 34ml/hr, titrate to goal as tolerated.  Lipids 20% @ 45ml/hr MWF only d/t shortage (convert to cyclic after TNA @ goal if necessary)  Plan at this time is for patient to go home tomorrow on TNA. Per hospital policy, goal is to get continuous TNA to goal prior to transition to cyclic to make sure patient tolerates goal rate first then convert to cyclic - per discussion with Dr. Rito Ehrlich this will be the plan  IV MVI/TE MWF only d/t shortage, convert to PO if able to tolerate  Check CBG's q4h due to elevated level this AM - will go ahead and order SSI as well  For low K - will order IV KCl  Routine TNA labs Monday and Thursdays   Hessie Knows, PharmD, BCPS Pager [phone] 11/12/2012 7:58 AM

## 2012-11-13 LAB — BASIC METABOLIC PANEL
Calcium: 7.7 mg/dL — ABNORMAL LOW (ref 8.4–10.5)
Chloride: 100 mEq/L (ref 96–112)
Creatinine, Ser: 0.49 mg/dL — ABNORMAL LOW (ref 0.50–1.10)
GFR calc Af Amer: >90 mL/min (ref >90)
Sodium: 132 mEq/L — ABNORMAL LOW (ref 135–145)

## 2012-11-13 MED ORDER — MAGIC MOUTHWASH W/LIDOCAINE: 5.0000 mL | Freq: Four times a day (QID) | ORAL | Status: DC | PRN

## 2012-11-13 MED ORDER — IPRATROPIUM BROMIDE 0.02 % IN SOLN: 0.5000 mg | Freq: Two times a day (BID) | RESPIRATORY_TRACT | Status: DC

## 2012-11-13 MED ORDER — ALBUTEROL SULFATE (5 MG/ML) 0.5% IN NEBU: 2.5000 mg | INHALATION_SOLUTION | Freq: Two times a day (BID) | RESPIRATORY_TRACT | Status: DC

## 2012-11-13 MED ORDER — HEPARIN SOD (PORK) LOCK FLUSH 100 UNIT/ML IV SOLN
INTRAVENOUS | Status: AC
  Administered 2012-11-13: 18:00:00
  Filled 2012-11-13: qty 5

## 2012-11-13 MED ORDER — MORPHINE SULFATE 20 MG/5ML PO SOLN: 10.0000 mg | ORAL | Status: DC | PRN

## 2012-11-13 NOTE — Care Management (Addendum)
Per chart pt's medical history includes COPD. Per pt note when therapy ambulated with patient in hallway patient desaturated to 83% on room air. Cm notified MD of pt's need for continuos home oxygen therapy at 2L. [facility] notified of pt's need for home oxygen dme prior to discharge.   SATURATION QUALIFICATIONS: (This note is used to comply with regulatory documentation for home oxygen)  Patient Saturations on Room Air at Rest = 96%  Patient Saturations on Room Air while Ambulating = 83%  Patient Saturations on 2 Liters of oxygen while Ambulating = 92%  Please briefly explain why patient needs home oxygen: oxygen desaturation on room air

## 2012-11-13 NOTE — Discharge Summary (Addendum)
[facility]  Physician Discharge Summary   Patient ID: [name] [name] MRN: [id number] DOB/AGE: Dec 25, 1940 72 y.o.  Admit date: 11/02/2012 Discharge date: 11/13/2012  PCP: Laurette Schimke, MD  DISCHARGE DIAGNOSES:  Principal Problem:   Partial small bowel obstruction Active Problems:   Ascites, malignant   Ovarian cancer  **PLEASE SEE ADDENDUM AT END OF REPORT**  RECOMMENDATIONS FOR OUTPATIENT FOLLOW UP: 1. Home Health will be arranged for TNA 2. Patient to go for chemotherapy next week.  DISCHARGE CONDITION: fair  Diet recommendation: Dysphagia 3 diet as tolerated  Filed Weights   11/03/12 0337  Weight: 61.1 kg (134 lb 11.2 oz)    INITIAL HISTORY: Pt is a 72 y.o. 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 In the ED, she was found to have early or partial SBO on CT abdomen. Hospitalist was asked to admit for further evaluation.   Consultations:  Dr. Darnelle Catalan with Oncology  Procedures:  None  HOSPITAL COURSE:   Partial small bowel obstruction  Patient was kept NPO initially. She slowly improved. Her diet was slowly advanced but her intake is not consistent. So TNA will be utilized temporarily to meet her nutritional needs. This will be administered via port cath. The obstruction is thought to be secondary to her malignancy. It is hoped that with chemotherapy the malignancy will get better and the obstruction will improve as well. In that case TNA can be tapered off. Magic mouthwash initiated for mouth pain. Anti-emetics as needed can be used at home. Repeat imaging studies show findings of partial obstruction.  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 Plan is to resume chemotherapy next week. Per oncology, appears to be improving based on CA125. Ascites  being drained periodically. Daughter knows to do this and home health will be arranged as well.  Anemia of chronic disease  Hgb is stable.   Hypokalemia and Hypomagnesemia  Electrolytes were corrected.   Severe malnutrition  This is due to her co-morbidities and poor oral intake. TNA will be utilized for now. Hopefully as SBO improves oral intake will improve and TNA can be tapered down.   Diarrhea  C. Diff by PCR negative. Probably related to SBO and relief of obstruction.  Tachycardia  Probably related to dehydration and acute illness. Rate better controlled, continue Atenolol.   Vaginal yeast infection  She was given Fluconazole while she was hospitalized.   Patient remains stable. She has achieved maximal benefit from this hospitalization. Rest of the issues can be addressed and followed as outpatient.   I discussed LTAC with patient and her daughters on 4/9. They were not receptive and wanted to go home which I supported. Somehow they misconstrued the conversation. They felt that I was suggesting that there nothing more for the patient that could be done. This apparently upset the husband and he was acting with a threatening demeanor towards me as stated in my note on 4/10. I tried to clear the misunderstanding but he would not talk to me.  Discussed all of the above with Dr. Darnelle Catalan and he agrees with the current plans. She will be discharged today with home health.   PERTINENT LABS:  The results of significant diagnostics from this hospitalization (including imaging, microbiology, ancillary and laboratory) are listed below for reference.    Microbiology: Recent Results (from the  past 240 hour(s))  CLOSTRIDIUM DIFFICILE BY PCR     Status: None   Collection Time    11/07/12  8:39 AM      Result Value Range Status   C difficile by pcr NEGATIVE  NEGATIVE Final     Labs: Basic Metabolic Panel:  Recent Labs Lab 11/07/12 0410 11/10/12 0520 11/11/12 0600 11/12/12 0426  11/13/12 0540  NA 138 139 136 134* 132*  K 3.4* 2.8* 3.4* 3.4* 4.0  CL 105 111 103 101 100  CO2 25 22 24 26 25   GLUCOSE 109* 72 76 215* 162*  BUN 5* 6 6 8 13   CREATININE 0.64 0.52 0.61 0.57 0.49*  CALCIUM 7.8* 6.4* 7.5* 7.6* 7.7*  MG  --   --  1.1* 1.7  --   PHOS  --   --   --  2.9  --    Liver Function Tests:  Recent Labs Lab 11/12/12 0426  AST 10  ALT 7  ALKPHOS 79  BILITOT 0.1*  PROT 4.7*  ALBUMIN 1.3*   CBC:  Recent Labs Lab 11/07/12 0410 11/10/12 0520 11/11/12 0600 11/12/12 0426  WBC 8.2 8.6 10.9* 11.7*  NEUTROABS  --   --   --  8.5*  HGB 9.0* 7.8* 9.5* 9.2*  HCT 26.1* 22.5* 27.4* 27.3*  MCV 88.2 88.2 87.8 87.5  PLT 652* 546* 639* 646*   CBG:  Recent Labs Lab 11/12/12 1151 11/12/12 1606 11/12/12 2009 11/12/12 2353 11/13/12 0354  GLUCAP 229* 127* 174* 146* 156*     IMAGING STUDIES Dg Abd 1 View  11/07/2012  *RADIOLOGY REPORT*  Clinical Data: Follow-up partial small bowel obstruction.  Nausea, vomiting.  ABDOMEN - 1 VIEW  Comparison: 04/02/2014and CT 11/02/2012  Findings: The gastric wall appears thickened.  There is separation of bowel loops, consistent with known carcinomatosis.  The small bowel loops in the central abdomen appears thick-walled and dilated. There is gas distally in the rectosigmoid colon, excluding complete obstruction.  Degenerative changes are seen in the spine.  IMPRESSION: Dilated and thick-walled small bowel loops, consistent with partial small bowel obstruction.  Separation of bowel loops is consistent with known carcinomatosis.   Original Report Authenticated By: Norva Pavlov, M.D.    Ct Abdomen Pelvis W Contrast  11/02/2012  *RADIOLOGY REPORT*  Clinical Data: Abdominal pain.  CT ABDOMEN AND PELVIS WITH CONTRAST  Technique:  Multidetector CT imaging of the abdomen and pelvis was performed following the standard protocol during bolus administration of intravenous contrast.  Contrast: 50mL OMNIPAQUE IOHEXOL 300 MG/ML  SOLN,  OMNIPAQUE IOHEXOL 300 MG/ML  SOLN  Comparison: CT abdomen and pelvis 09/24/2012  Findings: The lung bases are clear.  Diffuse infiltration and nodularity in the omentum and mesentery as well as and peritoneal compartments consistent with peritoneal carcinomatosis and appearing relatively stable since the previous study.  There is upper abdominal ascites which has improved since prior study.  The gallbladder is distended, similar to prior study. No bile duct dilatation.  The liver, spleen, pancreas, adrenal glands, kidneys, inferior vena cava, and retroperitoneal lymph nodes are unremarkable.  There is diffuse calcification of the abdominal aorta without aneurysm.  The stomach, the stomach is not abnormally distended.  Mild dilatation of the distal abdominal small bowel, new since previous study with a few air-fluid levels and some bowel wall thickening.  Terminal ileum is decompressed. Changes suggest obstruction, likely due to adhesions although a neoplastic involvement is not excluded.  Transition zone appears to be in the upper  pelvis.  Right lower quadrant drainage catheter is in place.  No free air.  Pelvis:  Stool filled colon is not abnormally distended.  No colonic wall thickening.  Appendix is not appreciated.  Small amount of free fluid in the pelvis.  A heterogeneously enhancing nodules in the lower pelvis consistent with metastasis and stable since previous study.  Bladder wall is not thickened.  Degenerative changes in the lumbar spine.  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 Abdominal ascites is improved since previous study with drainage catheter in place.  Interval development of mild small bowel dilatation with air-fluid levels suggesting early or partial obstruction.   Original Report Authenticated By: Burman Nieves, M.D.    Nm Hepato W/eject Fract  10/20/2012  *RADIOLOGY REPORT*  Clinical Data: Abdominal pain; no cholelithiasis  NUCLEAR MEDICINE HEPATOBILIARY  WITH GB  Views:  Anterior right upper quadrant  Radiopharmaceutical:   Technetium 44m Choletec  Dose:  5.5 mCi  Route of administration: Intravenous  Findings: Liver uptake of radiotracer is normal.  There is prompt visualization of small bowel, consistent with patency of the common bile duct.  Gallbladder does not visualize during the course of this study, even after administration of 2.4 mg of morphine with an additional administration of 1.5 mCi of technetium 39m Choletec.  IMPRESSION: Nonvisualization of gallbladder despite administration of intravenous morphine.  This finding raises concern for cystic duct obstruction/acute cholecystitis .  Common bile duct is patent as is evidenced by prompt visualization of small bowel.   Original Report Authenticated By: Bretta Bang, M.D.    US Paracentesis  10/22/2012  *RADIOLOGY REPORT*  Clinical Data: Ovarian cancer with recurrent malignant ascites  ULTRASOUND GUIDED PARACENTESIS  Comparison:  Previous paracentesis  An ultrasound guided paracentesis was thoroughly discussed with the patient and questions answered.  The benefits, risks, alternatives and complications were also discussed.  The patient understands and wishes to proceed with the procedure.  Written consent was obtained.  Ultrasound was performed to localize and mark an adequate pocket of fluid in the left lateral quadrant of the abdomen.  The area was then prepped and draped in the normal sterile fashion.  1% Lidocaine was used for local anesthesia.  Under ultrasound guidance a 19 gauge Yueh catheter was introduced.  Paracentesis was performed.  The catheter was removed and a dressing applied.  Complications:  None  Findings:  A total of approximately 2.5 liters of clear yellow fluid was removed.  A fluid sample was not sent for laboratory analysis.  IMPRESSION: Successful ultrasound guided paracentesis yielding 2.5 liters of ascites. Small-volume ascites left behind as residual, as the patient is  scheduled for tunneled peritoneal drainage catheter next week.  Read by Brayton El PA-C   Original Report Authenticated By: Malachy Moan, M.D.    Ir Perc Noralee Stain Perit Cath [facility]  10/30/2012  *RADIOLOGY REPORT*  Clinical Data:  Ovarian carcinoma and recurrent ascites requiring multiple paracentesis procedures.  INSERTION OF TUNNELED PERITONEAL DRAINAGE CATHETER  Sedation: 2.5 mg IV Versed; 125 mcg IV Fentanyl.  Total Moderate Sedation Time: 19 minutes.  Additional Medications:  1 gram IV Ancef.  As antibiotic prophylaxis, Ancef  was ordered pre-procedure and administered intravenously within one hour of incision.  Fluoroscopy Time: 0.4 minutes.  Procedure:  The procedure, risks, benefits, and alternatives were explained to the patient.  Questions regarding the procedure were encouraged and answered.  The patient understands and consents to the procedure.  The right abdominal wall was prepped with Betadine in  a sterile fashion, and a sterile drape was applied covering the operative field.  A sterile gown and sterile gloves were used for the procedure. Local anesthesia was provided with 1% Lidocaine. Ultrasound image documentation was performed.  Fluoroscopy during the procedure and fluoroscopic spot radiograph confirms appropriate catheter position.  After creating a small skin incision, a 19 gauge needle was advanced into the peritoneal cavity under ultrasound guidance.  A guide wire was then advanced under fluoroscopy into the peritoneal cavity.  Peritoneal access was dilated serially and a 16-French peel-away sheath placed.  A 16 French tunneled Bard Aspira catheter was placed.  This was tunneled from an incision 5 cm below the peritoneal access to the access site.  The catheter was advanced through the peel-away sheath.  The sheath was then removed.  Final catheter positioning was confirmed with a fluoroscopic spot image.  The peritoneal access incision was closed with subcutaneous 3-0 Monocryl and  subcuticular 4-0 Vicryl.  Dermabond was applied to the incision.  A Prolene retention suture was applied at the catheter exit site.  Large volume paracentesis was performed through the new catheter utilizing drainage bottles.  Complications:  None  Findings:  The catheter was placed via the right abdominal wall. Catheter course is towards the right upper quadrant.  Approximately 2.4 liters of ascites was able to be removed after catheter placement.  IMPRESSION:  Placement of tunneled peritoneal drainage catheter via right abdominal approach.  2.4 liters of ascites was removed today after catheter placement.   Original Report Authenticated By: Irish Lack, M.D.    Dg Abd 2 Views  11/04/2012  *RADIOLOGY REPORT*  Clinical Data: Nausea vomiting.  Follow up small bowel obstruction  ABDOMEN - 2 VIEW  Comparison: CT 11/02/2012  Findings: Peritoneal catheter from right-sided approach a crosses the midline into the left pelvis.  Small bowel loops are mildly dilated and are unchanged from the recent CT.  No free air is identified.  Colon is decompressed and contains  contrast from prior CT.  IMPRESSION: Mild small bowel dilatation is unchanged and may relate to small bowel obstruction.  No free air.   Original Report Authenticated By: Janeece Riggers, M.D.     DISCHARGE EXAMINATION: Filed Vitals:   11/12/12 1959 11/12/12 2011 11/13/12 0355 11/13/12 0758  BP:  102/52 107/52   Pulse:  100 101   Temp:  98.6 F (37 C) 99 F (37.2 C)   TempSrc:  Oral Oral   Resp:  16 15   Height:      Weight:      SpO2: 95% 94% 96% 95%   General appearance: alert, cooperative, appears stated age and no distress Resp: clear to auscultation bilaterally Cardio: regular rate and rhythm, S1, S2 normal, no murmur, click, rub or gallop GI: soft, mildly tender diffusely; bowel sounds normal; no masses,  no organomegaly. Drain noted. Neurologic: Alert and oriented x 3. No focal deficits.  DISPOSITION: Home with home  health  Discharge Orders   Future Appointments Provider Department Dept Phone   11/17/2012 10:45 AM Delcie Roch [facility] MEDICAL ONCOLOGY [phone]   11/17/2012 11:15 AM Amy Allegra Grana, PA-C [facility] [phone]   11/17/2012 12:15 PM [facility] [place] [phone]   11/19/2012 2:00 PM Laurette Schimke, MD [facility] GYNECOLOGICAL ONCOLOGY [phone]   11/25/2012 1:30 PM Tresa Garter, MD [facility] [phone]   12/08/2012 10:30 AM Regino Schultze  [facility] [phone]   12/08/2012 11:00 AM [facility] [place] [facility] [phone]   12/29/2012 10:15 AM Delcie Roch [place] [facility]COL[facility] [phone]   12/29/2012 10:45 AM [facility] [place] [facility] [phone]   Future Orders Complete By Expires     Discharge diet:  As directed     Comments:      Dysphagia 3 diet    Discharge instructions  As directed     Comments:      TNA per home health. Be sure to go for chemotherapy next week. Seek attention for worsening abdominal pain, vomiting or severe diarrhea. Drain ascitic fluid as before.    Increase activity slowly  As directed       Current Discharge Medication List    START taking these medications   Details  Alum & Mag Hydroxide-Simeth (MAGIC MOUTHWASH W/LIDOCAINE) SOLN Take 5 mLs by mouth 4 (four) times daily as needed. Qty: 250 mL, Refills: 1    morphine 20 MG/5ML solution Take 2.5 mLs (10 mg total) by mouth every 3 (three) hours as needed for pain. Qty: 100 mL, Refills: 0      CONTINUE these medications which have NOT CHANGED   Details  albuterol-ipratropium (COMBIVENT) 18-103 MCG/ACT inhaler Inhale 2 puffs into the lungs every 4 (four) hours as needed. For wheezing Qty: 14.7 Inhaler, Refills: 3    atenolol (TENORMIN) 25  MG tablet Take 12.5 mg by mouth 2 (two) times daily.    b complex vitamins tablet Take 1 tablet by mouth daily.      budesonide-formoterol (SYMBICORT) 160-4.5 MCG/ACT inhaler Inhale 2 puffs into the lungs 2 (two) times daily.    buPROPion (WELLBUTRIN SR) 150 MG 12 hr tablet Take 1 tablet (150 mg total) by mouth 2 (two) times daily. Qty: 180 tablet, Refills: 3    busPIRone (BUSPAR) 15 MG tablet Take 1 tablet (15 mg total) by mouth 2 (two) times daily. Qty: 180 tablet, Refills: 2    cholecalciferol (VITAMIN D) 1000 UNITS tablet Take 1,000 Units by mouth daily.      clopidogrel (PLAVIX) 75 MG tablet Take 1 tablet (75 mg total) by mouth daily with breakfast. Qty: 60 tablet, Refills: 3    diazepam (VALIUM) 5 MG tablet Take 5-10 mg by mouth at bedtime as needed for sleep.    ferrous sulfate 325 (65 FE) MG tablet Take 325 mg by mouth daily with breakfast.    lidocaine-prilocaine (EMLA) cream Apply 1 application topically as needed. For treatments    lovastatin (MEVACOR) 40 MG tablet Take 1 tablet (40 mg total) by mouth daily. Qty: 90 tablet, Refills: 2    Multiple Vitamin (MULTIVITAMIN WITH MINERALS) TABS Take 1 tablet by mouth daily.    omeprazole (PRILOSEC) 40 MG capsule Take 1 capsule (40 mg total) by mouth daily. Qty: 90 capsule, Refills: 3    ondansetron (ZOFRAN) 8 MG tablet Take 1 tablet (8 mg total) by mouth 2 (two) times daily. Take two times a day starting the day after chemo for 2 days. Then take two times a day as needed for nausea or vomiting. Qty: 30 tablet, Refills: 1   Associated Diagnoses: Constipation; Ovarian cancer, unspecified laterality    prochlorperazine (COMPAZINE) 10 MG tablet Take 1 tablet (10 mg total) by mouth every 6 (six) hours as needed (Nausea or vomiting). Qty: 30 tablet, Refills: 1   Associated Diagnoses: Constipation; Ovarian cancer, unspecified laterality  senna (SENOKOT) 8.6 MG tablet Take 1 tablet by mouth daily as needed for constipation.     sertraline (ZOLOFT) 100 MG tablet Take 1 tablet (100 mg total) by mouth daily before breakfast. Qty: 90 tablet, Refills: 3       Follow-up Information   Follow up with Lowella Dell, MD. (for chemo next week)    Contact information:   [address] [place] [address] [phone]       Call Laurette Schimke, MD. (As needed)    Contact information:   [address] [place] [address] [phone]       TOTAL DISCHARGE TIME: 35 mins  [facility]  [facility] Pager [phone]  11/13/2012, 8:25 AM  Room air sats were reported as greater than 92%. However with ambulation she dropped to 80's. There was no shortness of breath. Patient completed PT. Home Oxygen was ordered.  Just prior to discharge patient had a small emesis. She denied any pain more than usual. She was given compazine. I called in IV Zofran to the home health agency.   Family remained upset with the whole discharge process. Family has not been very easy to communicate with at times. Patient's husband wouldn't talk to me despite many attempts. He would get really angry at these attempts. Daughter was very upset as well. I spoke to them yesterday just prior to discharge. It appears they are still reconciling with her diagnosis. While sorting out the Zofran with home health the husband came up to me and placed his hand on my shoulder and told me that he will see me in court. Fortunately he did not get any more aggressive.  Patient was subsequently discharged.  Osvaldo Shipper

## 2012-11-13 NOTE — Progress Notes (Signed)
Pt D/C home, alert and oriented, no new complains, vitals within range for pts normal values. D/C instructions done, medication administration instructions done, pt verbalizes understanding.

## 2012-11-13 NOTE — Progress Notes (Signed)
Physical Therapy Treatment Patient Details Name: [name] [name] MRN: [id number] DOB: [birth year] Today's Date: 11/13/2012 Time: 6213-0865 PT Time Calculation (min): 33 min  PT Assessment / Plan / Recommendation Comments on Treatment Session  Pt planning to D/C to home today.  Amb in hallway on RA sats decreased to 83%.  Reapplied 2 lts nasal recovered to 92%.  Performed stairs. Assisted back to bed.    Follow Up Recommendations        Does the patient have the potential to tolerate intense rehabilitation     Barriers to Discharge        Equipment Recommendations  Rolling walker with 5" wheels    Recommendations for Other Services    Frequency Min 3X/week   Plan Discharge plan remains appropriate    Precautions / Restrictions   SATURATION QUALIFICATIONS: (This note is used to comply with regulatory documentation for home oxygen)  Patient Saturations on Room Air at Rest = 96%  Patient Saturations on Room Air while Ambulating = 83%  Patient Saturations on 2 Liters of oxygen while Ambulating = 92%  Please briefly explain why patient needs home oxygen: Pt demon 3/4 DOE   Pertinent Vitals/Pain No c/o pain    Mobility  Bed Mobility Bed Mobility: Sit to Supine Sit to Supine: 5: Supervision Details for Bed Mobility Assistance: increased time Transfers Transfers: Sit to Stand;Stand to Sit Sit to Stand: 5: Supervision;4: Min guard;From bed;From chair/3-in-1 Stand to Sit: 5: Supervision;4: Min guard;To chair/3-in-1;To bed Details for Transfer Assistance: <25% VC's on hand placement with sit to stand Ambulation/Gait Ambulation/Gait Assistance: 5: Supervision;4: Min guard Ambulation Distance (Feet): 5 Feet Assistive device: Rolling walker Ambulation/Gait Assistance Details: 25% VC's on poper walker to self distance and increased time to conserve enegy  Gait Pattern: Step-to pattern;Step-through pattern Gait velocity: decreased General Gait Details: Amb on RA sats decreased to  83%, re applied 2 lts nasal recovered to 92%. Stairs: Yes Stairs Assistance: 4: Min guard;5: Supervision Stairs Assistance Details (indicate cue type and reason): step to gait and one VC on safety with O2 cord Stair Management Technique: One rail Right;Forwards Number of Stairs: 2     PT Goals                                                 progressing    Visit Information  Last PT Received On: 11/13/12 Assistance Needed: +1    Subjective Data      Cognition       Balance   fair  End of Session PT - End of Session Equipment Utilized During Treatment: Gait belt;Oxygen Activity Tolerance: Patient tolerated treatment well;Patient limited by fatigue Patient left: in bed;with call bell/phone within reach   [name]  PTA [facility]  Acute  Rehab Pager      [phone]

## 2012-11-13 NOTE — Progress Notes (Signed)
Patient discharged home, pt is alert and oriented, no new complaints, tolerated diet,  Slight nausea, vomited about 30cc RN medicated pt with compazine 10 mg. 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 Patients daughter wanted IV Nausea Medication. RN Notified DR Rito Ehrlich. DR Rito Ehrlich called in IV Zofran to [facility]. pt's daughter did not allow Nurse tech to assist with getting patient ready for discharge. Pt went home, with  hospital gown. pt's daughter refused the hospital transporter to take patient down the elevator. Patients spouse was angry and was saying he was dissatisfied with care and will see the Doctor in court.  Pt left with Oxygen Delivered into pt's Room . front wheel walker and other equipments needed for the patient at home  del[facility] to [facility] by [facility] by 5 pm.

## 2012-11-13 NOTE — Progress Notes (Signed)
[facility]  Patient Status: New patient to [facility] for our services.  Plan to d/c home today on TNA.  [facility] is providing the following services: HH Nursing, OT, PT, Infusion Pharmacy, MSW (possible palliative care consult as requested by family to explore options based on clinical status/needs).  [facility] has provided in hospital training for home TPN with family, husband [name] and daughters, [name] and [place].  Will do well with TNA at home.  Will await final D/C orders today for D/C to home.  If patient discharges after hours, please call [phone].   Sedalia Muta 11/13/2012, 10:29 AM

## 2012-11-13 NOTE — Care Management (Signed)
Cm spoke with patient's spouse per pt's permission at [phone] concerning confirmation of discharge plans as noted by MD progress notes. Pt's spouse confirmed to discharge home with HHRN/PT/OT provided by [facility]. Spouse request hospital bed, BSC, & RW for patient. MD orders entered. [facility] DME rep [place] notified of DME orders. TNA education provided by [facility] prior to discharge. No other needs stated.   Roxy Manns Tonianne Fine,RN,BSN [phone]

## 2012-11-14 ENCOUNTER — Other Ambulatory Visit: Payer: Self-pay | Admitting: Internal Medicine

## 2012-11-16 ENCOUNTER — Other Ambulatory Visit: Payer: Self-pay | Admitting: Oncology

## 2012-11-17 ENCOUNTER — Ambulatory Visit (HOSPITAL_BASED_OUTPATIENT_CLINIC_OR_DEPARTMENT_OTHER): Payer: Medicare Other

## 2012-11-17 ENCOUNTER — Encounter: Payer: Self-pay | Admitting: Physician Assistant

## 2012-11-17 ENCOUNTER — Other Ambulatory Visit (HOSPITAL_BASED_OUTPATIENT_CLINIC_OR_DEPARTMENT_OTHER): Payer: Medicare Other | Admitting: Lab

## 2012-11-17 ENCOUNTER — Telehealth: Payer: Self-pay | Admitting: *Deleted

## 2012-11-17 ENCOUNTER — Ambulatory Visit (HOSPITAL_BASED_OUTPATIENT_CLINIC_OR_DEPARTMENT_OTHER): Payer: Medicare Other | Admitting: Physician Assistant

## 2012-11-17 VITALS — BP 104/62 | HR 102 | Temp 97.8°F | Resp 20 | Ht 64.0 in | Wt 122.7 lb

## 2012-11-17 DIAGNOSIS — Z853 Personal history of malignant neoplasm of breast: Secondary | ICD-10-CM

## 2012-11-17 DIAGNOSIS — R180 Malignant ascites: Secondary | ICD-10-CM

## 2012-11-17 DIAGNOSIS — C569 Malignant neoplasm of unspecified ovary: Secondary | ICD-10-CM

## 2012-11-17 DIAGNOSIS — R188 Other ascites: Secondary | ICD-10-CM

## 2012-11-17 DIAGNOSIS — K5900 Constipation, unspecified: Secondary | ICD-10-CM

## 2012-11-17 DIAGNOSIS — Z5111 Encounter for antineoplastic chemotherapy: Secondary | ICD-10-CM

## 2012-11-17 DIAGNOSIS — K56600 Partial intestinal obstruction, unspecified as to cause: Secondary | ICD-10-CM

## 2012-11-17 DIAGNOSIS — R140 Abdominal distension (gaseous): Secondary | ICD-10-CM

## 2012-11-17 DIAGNOSIS — C786 Secondary malignant neoplasm of retroperitoneum and peritoneum: Secondary | ICD-10-CM

## 2012-11-17 LAB — CBC WITH DIFFERENTIAL/PLATELET
Eosinophils Absolute: 0.1 10*3/uL (ref 0.0–0.5)
HCT: 28.8 % — ABNORMAL LOW (ref 34.8–46.6)
LYMPH%: 14.9 % (ref 14.0–49.7)
MONO#: 1.4 10*3/uL — ABNORMAL HIGH (ref 0.1–0.9)
NEUT#: 12.2 10*3/uL — ABNORMAL HIGH (ref 1.5–6.5)
NEUT%: 75.6 % (ref 38.4–76.8)
Platelets: 658 10*3/uL — ABNORMAL HIGH (ref 145–400)
WBC: 16.2 10*3/uL — ABNORMAL HIGH (ref 3.9–10.3)

## 2012-11-17 LAB — COMPREHENSIVE METABOLIC PANEL (CC13)
CO2: 30 mEq/L — ABNORMAL HIGH (ref 22–29)
Creatinine: 0.6 mg/dL (ref 0.6–1.1)
Glucose: 212 mg/dl — ABNORMAL HIGH (ref 70–99)
Total Bilirubin: <0.2 mg/dL (ref 0.20–1.20)
Total Protein: 5.5 g/dL — ABNORMAL LOW (ref 6.4–8.3)

## 2012-11-17 LAB — CA 125: CA 125: 473 U/mL — ABNORMAL HIGH (ref 0.0–30.2)

## 2012-11-17 MED ORDER — SODIUM CHLORIDE 0.9 % IJ SOLN
10.0000 mL | INTRAMUSCULAR | Status: DC | PRN
  Administered 2012-11-17: 10 mL
  Filled 2012-11-17: qty 10

## 2012-11-17 MED ORDER — ONDANSETRON 8 MG/50ML IVPB (CHCC)
8.0000 mg | Freq: Once | INTRAVENOUS | Status: AC
  Administered 2012-11-17: 8 mg via INTRAVENOUS

## 2012-11-17 MED ORDER — HEPARIN SOD (PORK) LOCK FLUSH 100 UNIT/ML IV SOLN
500.0000 [IU] | Freq: Once | INTRAVENOUS | Status: AC | PRN
  Administered 2012-11-17: 500 [IU]
  Filled 2012-11-17: qty 5

## 2012-11-17 MED ORDER — SODIUM CHLORIDE 0.9 % IV SOLN
Freq: Once | INTRAVENOUS | Status: AC
  Administered 2012-11-17: 13:00:00 via INTRAVENOUS

## 2012-11-17 MED ORDER — DOXORUBICIN HCL LIPOSOMAL CHEMO INJECTION 2 MG/ML
40.0000 mg/m2 | Freq: Once | INTRAVENOUS | Status: AC
  Administered 2012-11-17: 66 mg via INTRAVENOUS
  Filled 2012-11-17: qty 33

## 2012-11-17 MED ORDER — DEXAMETHASONE SODIUM PHOSPHATE 10 MG/ML IJ SOLN
10.0000 mg | Freq: Once | INTRAMUSCULAR | Status: AC
  Administered 2012-11-17: 10 mg via INTRAVENOUS

## 2012-11-17 NOTE — Telephone Encounter (Signed)
appts made and printed..emailed MW to adjust tx for 12/08/12. Pt is aware.td

## 2012-11-17 NOTE — Patient Instructions (Signed)
[facility] Discharge Instructions for Patients Receiving Chemotherapy  Today you received the following chemotherapy agents Doxil   To help prevent nausea and vomiting after your treatment, we encourage you to take your nausea medication as directed.   If you develop nausea and vomiting that is not controlled by your nausea medication, call the clinic. If it is after clinic hours your family physician or the after hours number for the clinic or go to the Emergency Department.   BELOW ARE SYMPTOMS THAT SHOULD BE REPORTED IMMEDIATELY:  *FEVER GREATER THAN 100.5 F  *CHILLS WITH OR WITHOUT FEVER  NAUSEA AND VOMITING THAT IS NOT CONTROLLED WITH YOUR NAUSEA MEDICATION  *UNUSUAL SHORTNESS OF BREATH  *UNUSUAL BRUISING OR BLEEDING  TENDERNESS IN MOUTH AND THROAT WITH OR WITHOUT PRESENCE OF ULCERS  *URINARY PROBLEMS  *BOWEL PROBLEMS  UNUSUAL RASH Items with * indicate a potential emergency and should be followed up as soon as possible.  One of the nurses will contact you 24 hours after your treatment. Please let the nurse know about any problems that you may have experienced. Feel free to call the clinic you have any questions or concerns. The clinic phone number is [phone].   I have been informed and understand all the instructions given to me. I know to contact the clinic, my physician, or go to the Emergency Department if any problems should occur. I do not have any questions at this time, but understand that I may call the clinic during office hours   should I have any questions or need assistance in obtaining follow up care.    __________________________________________  _____________  __________ Signature of Patient or Authorized Representative            Date                   Time    __________________________________________ Nurse's Signature

## 2012-11-17 NOTE — Telephone Encounter (Signed)
Per scheduler I havd adjusted 5/6 appt.  JMW

## 2012-11-17 NOTE — Progress Notes (Signed)
ID: [name] [name]   DOB: 27-May-1941  MR#: [id number]  CSN#:[id number]  PCP:  GYN: [name] Schimke, MD SU: [name] OTHER MD:  HISTORY OF PRESENT ILLNESS: [name] [name] is a 72 year-old [place] woman I followed remotely for a history of breast cancer. More recently she noted that her belly was getting "bigger". Gradually she has developed increasing low back pain. After a period of several months, as her symptoms increased, she brought this problem to her primary physician's attention and a KUB was obtained on 10/07/2011, which was unremarkable. This was followed by a CT scan 10/15/2011, which showed ascites and omental caking. Paracentesis 10/18/2011 showed ([id number]) malignant cells consistent with metastatic adenocarcinoma, with equivocal staining for estrogen receptor. The pattern of additional stains was most suggestive of a primary gynecologic tumor. CA 125 at this time was 2051.  The patient was referred to gynecologic oncology and on 10/29/2011 underwent exploratory laparotomy under Drs. [name] [name] and [name] [name]. This consisted of a total abdominal hysterectomy with bilateral salpingo-oophorectomy, omentectomy, and radical tumor debulking. Unfortunately there was significant tumor attached to the diaphragm so the patient was suboptimally debulked. Accordingly a peritoneal catheter was not placed. GYN-ONC recommended 6 cycles of carboplatin/paclitaxel given every 3 weeks, with Neulasta on day 2 for granulocyte support. [name] [name]'s subsequent history is as detailed below.  INTERVAL HISTORY:  [name] [name] of her metastatic ovarian cancer. She is due for day 1 cycle 3 of Doxil being given every 3 weeks.   Interval history is remarkable for [name] [name] having been hospitalized between 11/02/2012 and 11/13/2012, initially with uncontrolled emesis, found to be secondary to a partial small bowel obstruction.  Fortunately, she is currently having regular bowel movements, soft and formed. Her appetite is still decreased and she continues to have early satiety, but finds that she is able to avoid nausea and emesis as long she eats very small meals. She is also on TNA and is followed by home health.    REVIEW OF SYSTEMS:  [name] [name] has had no fevers or chills. She continues to have hot flashes. She notes no skin changes, rashes, or evidence of  PPE.  She's had no mouth ulcers or oral sensitivity. She denies any signs of abnormal bleeding. She has no significant cough, no phlegm production, but continues to have shortness of breath with even mild exertion. She's had no chest pain or palpitations. She denies abnormal headaches or dizziness. She did have a fall a few nights ago after getting a by herself to use the restroom during the night. She denies any injuries. She currently denies any unusual myalgias, arthralgias, or bony pain. Her feet and ankles swell, right slightly greater than left, but this improves with elevation.  A detailed view of systems is otherwise stable and noncontributory.   PAST MEDICAL HISTORY: Past Medical History  Diagnosis Date  . History of breast cancer   . COPD (chronic obstructive pulmonary disease)   . GERD (gastroesophageal reflux disease)   . Diabetes mellitus     type II  . Hx of colonic polyps   . Osteoarthritis   . MVP (mitral valve prolapse)   . Dysrhythmia     pt states runs a little fast   . Shortness of breath     with exertion   . Depression   . Anxiety   . Heart murmur   . Cancer     left breast  . Ovarian cancer  active chemotherapy  . Stroke   . Ascites 10/22/2012    PAST SURGICAL HISTORY: Past Surgical History  Procedure Laterality Date  . Cesarean section  1980, 1982    X 2   . Laparoscopy      work up for infertility  . Tympanoplasty      left X 2  . Left breast fibroadenoma removal  1960's  . Breast cyst aspirations    . Left modified  radial mastectomy    . Laparotomy  10/29/2011    Procedure: EXPLORATORY LAPAROTOMY;  Surgeon: Rejeana Brock A. Duard Brady, MD;  Location: [facility];  Service: Gynecology;  Laterality: N/A;  . Abdominal hysterectomy  10/29/2011    Procedure: HYSTERECTOMY ABDOMINAL;  Surgeon: Rejeana Brock A. Duard Brady, MD;  Location: [facility];  Service: Gynecology;  Laterality: N/A;  Total abdominal hysterectomy  . Salpingoophorectomy  10/29/2011    Procedure: SALPINGO OOPHERECTOMY;  Surgeon: Rejeana Brock A. Duard Brady, MD;  Location: [facility];  Service: Gynecology;  Laterality: Bilateral;    FAMILY HISTORY Family History  Problem Relation Age of Onset  . Cancer Mother     endometrial/ colon  . Cancer Brother     colon  . COPD Other   . Hypertension Other   . Cancer Maternal Uncle     prostate/bladder/lung  . Cancer Paternal Aunt     unknown abdominal cancer  . Cancer Maternal Grandmother     Salivary gland cancer  . Cancer Paternal Aunt     leukemia   the patient's father died at the age of 68. The patient's mother died at the age of 70; she had a history of colon cancer and endometrial cancer. The patient has no sisters. She has one brother, with a history of colon cancer. There is no history of breast cancer in the family. The patient is scheduled for genetic testing later this month.  GYNECOLOGIC HISTORY: She had menarche age 37. First pregnancy to term was at age 6. She is GX P2. She became menopausal at the time of her chemotherapy for breast cancer. This was age 13. 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 Her husband [name] [name] runs a Psychologist, sport and exercise out of their own home. Daughter [name] [name], 43,  currently lives at home with the patient. There are some issues relating to this daughter that the patient discussed briefly. [name] [name]. [name] [name], 30, is an ED nurse at [employer]. The patient has no grandchildren. She is not a Advice worker.   ADVANCED DIRECTIVES: in  place  HEALTH MAINTENANCE: History  Substance Use Topics  . Smoking status: Current Some Day Smoker -- 0.50 packs/day for 30 years    Types: Cigarettes  . Smokeless tobacco: Never Used     Comment: quit again 2009; restarted 1 ppd 2011  . Alcohol Use: No     Colonoscopy: 2009  PAP: s/p hysterectomy  Bone density: 2010. "good"  Mammography: 2012 NOV--unremarkable  Allergies  Allergen Reactions  . Promethazine     "Makes me feel like I want to jump out of my skin"    Current Outpatient Prescriptions  Medication Sig Dispense Refill  . albuterol-ipratropium (COMBIVENT) 18-103 MCG/ACT inhaler Inhale 2 puffs into the lungs every 4 (four) hours as needed. For wheezing  14.7 Inhaler  3  . Alum & Mag Hydroxide-Simeth (MAGIC MOUTHWASH W/LIDOCAINE) SOLN Take 5 mLs by mouth 4 (four) times daily as needed.  250 mL  1  . atenolol (TENORMIN) 25 MG tablet  Take 12.5 mg by mouth 2 (two) times daily.      . budesonide-formoterol (SYMBICORT) 160-4.5 MCG/ACT inhaler Inhale 2 puffs into the lungs 2 (two) times daily.      Marland Kitchen buPROPion (WELLBUTRIN SR) 150 MG 12 hr tablet Take 1 tablet (150 mg total) by mouth 2 (two) times daily.  180 tablet  3  . busPIRone (BUSPAR) 15 MG tablet Take 1 tablet (15 mg total) by mouth 2 (two) times daily.  180 tablet  2  . clopidogrel (PLAVIX) 75 MG tablet Take 1 tablet (75 mg total) by mouth daily with breakfast.  60 tablet  3  . diazepam (VALIUM) 5 MG tablet Take 5-10 mg by mouth at bedtime as needed for sleep.      Marland Kitchen lidocaine-prilocaine (EMLA) cream Apply 1 application topically as needed. For treatments      . lovastatin (MEVACOR) 40 MG tablet Take 1 tablet (40 mg total) by mouth daily.  90 tablet  2  . omeprazole (PRILOSEC) 40 MG capsule Take 1 capsule (40 mg total) by mouth daily.  90 capsule  3  . ondansetron (ZOFRAN) 8 MG tablet Take 1 tablet (8 mg total) by mouth 2 (two) times daily. Take two times a day starting the day after chemo for 2 days. Then take two times  a day as needed for nausea or vomiting.  30 tablet  1  . oxyCODONE-acetaminophen (PERCOCET/ROXICET) 5-325 MG per tablet       . prochlorperazine (COMPAZINE) 10 MG tablet Take 1 tablet (10 mg total) by mouth every 6 (six) hours as needed (Nausea or vomiting).  30 tablet  1  . sertraline (ZOLOFT) 100 MG tablet Take 1 tablet (100 mg total) by mouth daily before breakfast.  90 tablet  3  . morphine 20 MG/5ML solution Take 2.5 mLs (10 mg total) by mouth every 3 (three) hours as needed for pain.  100 mL  0  . [DISCONTINUED] busPIRone (BUSPAR) 15 MG tablet Take 1 tablet (15 mg total) by mouth 2 (two) times daily.  180 tablet  1  . [DISCONTINUED] roflumilast (DALIRESP) 500 MCG TABS tablet Take 500 mcg by mouth daily.         No current facility-administered medications for this visit.   Facility-Administered Medications Ordered in Other Visits  Medication Dose Route Frequency Provider Last Rate Last Dose  . DOXOrubicin HCL LIPOSOMAL (DOXIL) 66 mg in dextrose 5 % 250 mL chemo infusion  40 mg/m2 (Treatment Plan Actual) Intravenous Once Inesha Sow G Jyll Tomaro, PA-C      . heparin lock flush 100 unit/mL  500 Units Intracatheter Once PRN Tavita Eastham Allegra Grana, PA-C      . sodium chloride 0.9 % injection 10 mL  10 mL Intracatheter PRN Priya Matsen Allegra Grana, PA-C       OBJECTIVE: Older white woman who appears fatigued   Filed Vitals:   11/17/12 1110  BP: 104/62  Pulse: 102  Temp: 97.8 F (36.6 C)  Resp: 20     Body mass index is 21.05 kg/(m^2).    ECOG FS: 2 Filed Weights   11/17/12 1110  Weight: 122 lb 11.2 oz (55.656 kg)   Sclerae unicteric Oropharynx clear,  no ulcerations No cervical or supraclavicular adenopathy Lungs diminished breath sounds bilaterally in the bases,  Heart regular rate and rhythm Abdomen mildly distended, nontender, positive bowel sounds MSK  no peripheral edema Neuro: nonfocal, well oriented, tired affect Breasts: Deferred.    LAB RESULTS: Lab Results  Component Value Date  WBC 16.2* 11/17/2012    NEUTROABS 12.2* 11/17/2012   HGB 9.3* 11/17/2012   HCT 28.8* 11/17/2012   MCV 88.6 11/17/2012   PLT 658* 11/17/2012      Chemistry      Component Value Date/Time   NA 132* 11/13/2012 0540   NA 139 10/27/2012 1051   K 4.0 11/13/2012 0540   K 4.7 10/27/2012 1051   CL 100 11/13/2012 0540   CL 102 10/27/2012 1051   CO2 25 11/13/2012 0540   CO2 25 10/27/2012 1051   BUN 13 11/13/2012 0540   BUN 17.5 10/27/2012 1051   CREATININE 0.49* 11/13/2012 0540   CREATININE 0.8 10/27/2012 1051      Component Value Date/Time   CALCIUM 7.7* 11/13/2012 0540   CALCIUM 8.6 10/27/2012 1051   ALKPHOS 79 11/12/2012 0426   ALKPHOS 83 10/27/2012 1051   AST 10 11/12/2012 0426   AST 14 10/27/2012 1051   ALT 7 11/12/2012 0426   ALT 12 10/27/2012 1051   BILITOT 0.1* 11/12/2012 0426   BILITOT <0.20 Repeated and Verified 10/27/2012 1051    Results for BRETA, DEMEDEIROS (MRN [id number]) as of 10/01/2012 10:00  Ref. Range 05/05/2012 10:00 05/18/2012 10:29 06/17/2012 10:12 07/06/2012 12:37 09/21/2012 11:50  CA 125 Latest Range: 0.0-30.2 U/mL 38.8 (H) 37.5 (H) 33.3 (H) 35.7 (H) 879.3 (H)    STUDIES:  Dg Abd 1 View  11/07/2012  *RADIOLOGY REPORT*  Clinical Data: Follow-up partial small bowel obstruction.  Nausea, vomiting.  ABDOMEN - 1 VIEW  Comparison: 04/02/2014and CT 11/02/2012  Findings: The gastric wall appears thickened.  There is separation of bowel loops, consistent with known carcinomatosis.  The small bowel loops in the central abdomen appears thick-walled and dilated. There is gas distally in the rectosigmoid colon, excluding complete obstruction.  Degenerative changes are seen in the spine.  IMPRESSION: Dilated and thick-walled small bowel loops, consistent with partial small bowel obstruction.  Separation of bowel loops is consistent with known carcinomatosis.   Original Report Authenticated By: Norva Pavlov, M.D.    Ct Abdomen Pelvis W Contrast  11/02/2012  *RADIOLOGY REPORT*  Clinical Data: Abdominal pain.  CT ABDOMEN AND  PELVIS WITH CONTRAST  Technique:  Multidetector CT imaging of the abdomen and pelvis was performed following the standard protocol during bolus administration of intravenous contrast.  Contrast: 50mL OMNIPAQUE IOHEXOL 300 MG/ML  SOLN, OMNIPAQUE IOHEXOL 300 MG/ML  SOLN  Comparison: CT abdomen and pelvis 09/24/2012  Findings: The lung bases are clear.  Diffuse infiltration and nodularity in the omentum and mesentery as well as and peritoneal compartments consistent with peritoneal carcinomatosis and appearing relatively stable since the previous study.  There is upper abdominal ascites which has improved since prior study.  The gallbladder is distended, similar to prior study. No bile duct dilatation.  The liver, spleen, pancreas, adrenal glands, kidneys, inferior vena cava, and retroperitoneal lymph nodes are unremarkable.  There is diffuse calcification of the abdominal aorta without aneurysm.  The stomach, the stomach is not abnormally distended.  Mild dilatation of the distal abdominal small bowel, new since previous study with a few air-fluid levels and some bowel wall thickening.  Terminal ileum is decompressed. Changes suggest obstruction, likely due to adhesions although a neoplastic involvement is not excluded.  Transition zone appears to be in the upper pelvis.  Right lower quadrant drainage catheter is in place.  No free air.  Pelvis:  Stool filled colon is not abnormally distended.  No colonic wall thickening.  Appendix is  not appreciated.  Small amount of free fluid in the pelvis.  A heterogeneously enhancing nodules in the lower pelvis consistent with metastasis and stable since previous study.  Bladder wall is not thickened.  Degenerative changes in the lumbar spine.  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 Abdominal ascites is improved since previous study with drainage catheter in place.  Interval development of mild small bowel dilatation with air-fluid levels  suggesting early or partial obstruction.   Original Report Authenticated By: Burman Nieves, M.D.    Nm Hepato W/eject Fract  10/20/2012  *RADIOLOGY REPORT*  Clinical Data: Abdominal pain; no cholelithiasis  NUCLEAR MEDICINE HEPATOBILIARY WITH GB  Views:  Anterior right upper quadrant  Radiopharmaceutical:   Technetium 84m Choletec  Dose:  5.5 mCi  Route of administration: Intravenous  Findings: Liver uptake of radiotracer is normal.  There is prompt visualization of small bowel, consistent with patency of the common bile duct.  Gallbladder does not visualize during the course of this study, even after administration of 2.4 mg of morphine with an additional administration of 1.5 mCi of technetium 23m Choletec.  IMPRESSION: Nonvisualization of gallbladder despite administration of intravenous morphine.  This finding raises concern for cystic duct obstruction/acute cholecystitis .  Common bile duct is patent as is evidenced by prompt visualization of small bowel.   Original Report Authenticated By: Bretta Bang, M.D.    US Paracentesis  10/22/2012  *RADIOLOGY REPORT*  Clinical Data: Ovarian cancer with recurrent malignant ascites  ULTRASOUND GUIDED PARACENTESIS  Comparison:  Previous paracentesis  An ultrasound guided paracentesis was thoroughly discussed with the patient and questions answered.  The benefits, risks, alternatives and complications were also discussed.  The patient understands and wishes to proceed with the procedure.  Written consent was obtained.  Ultrasound was performed to localize and mark an adequate pocket of fluid in the left lateral quadrant of the abdomen.  The area was then prepped and draped in the normal sterile fashion.  1% Lidocaine was used for local anesthesia.  Under ultrasound guidance a 19 gauge Yueh catheter was introduced.  Paracentesis was performed.  The catheter was removed and a dressing applied.  Complications:  None  Findings:  A total of approximately 2.5 liters  of clear yellow fluid was removed.  A fluid sample was not sent for laboratory analysis.  IMPRESSION: Successful ultrasound guided paracentesis yielding 2.5 liters of ascites. Small-volume ascites left behind as residual, as the patient is scheduled for tunneled peritoneal drainage catheter next week.  Read by Brayton El PA-C   Original Report Authenticated By: Malachy Moan, M.D.    Ir Perc Noralee Stain Perit Cath [facility]  10/30/2012  *RADIOLOGY REPORT*  Clinical Data:  Ovarian carcinoma and recurrent ascites requiring multiple paracentesis procedures.  INSERTION OF TUNNELED PERITONEAL DRAINAGE CATHETER  Sedation: 2.5 mg IV Versed; 125 mcg IV Fentanyl.  Total Moderate Sedation Time: 19 minutes.  Additional Medications:  1 gram IV Ancef.  As antibiotic prophylaxis, Ancef  was ordered pre-procedure and administered intravenously within one hour of incision.  Fluoroscopy Time: 0.4 minutes.  Procedure:  The procedure, risks, benefits, and alternatives were explained to the patient.  Questions regarding the procedure were encouraged and answered.  The patient understands and consents to the procedure.  The right abdominal wall was prepped with Betadine in a sterile fashion, and a sterile drape was applied covering the operative field.  A sterile gown and sterile gloves were used for the procedure. Local anesthesia was provided with 1% Lidocaine.  Ultrasound image documentation was performed.  Fluoroscopy during the procedure and fluoroscopic spot radiograph confirms appropriate catheter position.  After creating a small skin incision, a 19 gauge needle was advanced into the peritoneal cavity under ultrasound guidance.  A guide wire was then advanced under fluoroscopy into the peritoneal cavity.  Peritoneal access was dilated serially and a 16-French peel-away sheath placed.  A 16 French tunneled Bard Aspira catheter was placed.  This was tunneled from an incision 5 cm below the peritoneal access to the access site.  The  catheter was advanced through the peel-away sheath.  The sheath was then removed.  Final catheter positioning was confirmed with a fluoroscopic spot image.  The peritoneal access incision was closed with subcutaneous 3-0 Monocryl and subcuticular 4-0 Vicryl.  Dermabond was applied to the incision.  A Prolene retention suture was applied at the catheter exit site.  Large volume paracentesis was performed through the new catheter utilizing drainage bottles.  Complications:  None  Findings:  The catheter was placed via the right abdominal wall. Catheter course is towards the right upper quadrant.  Approximately 2.4 liters of ascites was able to be removed after catheter placement.  IMPRESSION:  Placement of tunneled peritoneal drainage catheter via right abdominal approach.  2.4 liters of ascites was removed today after catheter placement.   Original Report Authenticated By: Irish Lack, M.D.    Dg Abd 2 Views  11/04/2012  *RADIOLOGY REPORT*  Clinical Data: Nausea vomiting.  Follow up small bowel obstruction  ABDOMEN - 2 VIEW  Comparison: CT 11/02/2012  Findings: Peritoneal catheter from right-sided approach a crosses the midline into the left pelvis.  Small bowel loops are mildly dilated and are unchanged from the recent CT.  No free air is identified.  Colon is decompressed and contains  contrast from prior CT.  IMPRESSION: Mild small bowel dilatation is unchanged and may relate to small bowel obstruction.  No free air.   Original Report Authenticated By: Janeece Riggers, M.D.     ASSESSMENT: 72 y.o.  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 [V323G (1196T>G)]    (1) status post left modified radical mastectomy October 1993 for a T2 N0 invasive ductal breast, estrogen and progesterone receptor positive cancer treated adjuvantly with MFL (methotrexate/fluorouracil/leucovorin) chemotherapy x6, completed April 1994, followed by 5 years of tamoxifen completed April 1999,  with no evidence of recurrence  (2) now status post TAH BSO, omentectomy, and suboptimal debulking 10/29/2011 for a high-grade serous adenocarcinoma of the ovary, pT3c NX (Stage IIIC) , s/p eight cycles of carboplatin/paclitaxel completed 05/05/2012 (initial CA125 of 2050.7 normalized after cycle 5)  (3) recurrent disease documented by rising CEA 125 an abdominal CT scan 09/21/2012.  (4) COPD with ongoing tobacco abuse  (5) diabetes mellitus  (6) genetic testing for Lynch syndrome pending.  (7)  status post hospitalization in July 2013 for stroke/small acute infarcts in the left sub insular white matter and coronal radiata.  (8) port in place  (9) left upper lobe 6 mm subpleural nodule, without associated hypermetabolic activity (likely well below the resolution of PET imaging) noted August 2013  (10) CTs obtained in [place] 2014 showed evidence of significant progression of disease with widespread intraperitoneal metastases and a moderate volume of presumably malignant ascites.  (11) currently being treated with Doxil on a Q. three-week basis, first dose given on 10/06/2012  (12) cholecystitis, with cholecystectomy pending  (13)  recurring ascites, requiring therapeutic paracentesis, and pending placement  of Aspira drain   PLAN:  This case was reviewed with Dr. Darnelle Catalan who also spoke extensively with Dominick and her family today. She will proceed to treatment today as scheduled for day 1 cycle 3 of Doxil. She will return next week for labs and [name] on April 21, and will have a repeat KUB just prior to that appointment.  The plan is to continue the Doxil every 3 weeks, then restage after 4 cycles, with cycle 4 currently scheduled for May 6. Annet and [address] both voice understanding and agreement with this plan, and will call with any changes or problems.   Laylani Pudwill    11/17/2012

## 2012-11-18 ENCOUNTER — Other Ambulatory Visit: Payer: Self-pay | Admitting: Internal Medicine

## 2012-11-19 ENCOUNTER — Ambulatory Visit: Payer: Medicare Other | Attending: Gynecologic Oncology | Admitting: Gynecologic Oncology

## 2012-11-23 ENCOUNTER — Encounter: Payer: Medicare Other | Admitting: Family

## 2012-11-23 ENCOUNTER — Encounter: Payer: Self-pay | Admitting: Family

## 2012-11-23 ENCOUNTER — Other Ambulatory Visit: Payer: Medicare Other

## 2012-11-23 ENCOUNTER — Telehealth: Payer: Self-pay | Admitting: *Deleted

## 2012-11-23 NOTE — Progress Notes (Signed)
This encounter was created in error - please disregard.

## 2012-11-23 NOTE — Telephone Encounter (Signed)
Message left by home health nurse with [facility], requesting order for home health aide to assist pt in home. Pt is currently receiving home TPN. Return call number given as [phone].  This RN returned call and obtained identified VM. Message left for above order.

## 2012-11-24 ENCOUNTER — Other Ambulatory Visit: Payer: Self-pay | Admitting: Internal Medicine

## 2012-11-25 ENCOUNTER — Ambulatory Visit (INDEPENDENT_AMBULATORY_CARE_PROVIDER_SITE_OTHER): Payer: Medicare Other | Admitting: Internal Medicine

## 2012-11-25 DIAGNOSIS — R143 Flatulence: Secondary | ICD-10-CM

## 2012-11-25 DIAGNOSIS — C801 Malignant (primary) neoplasm, unspecified: Secondary | ICD-10-CM

## 2012-11-25 DIAGNOSIS — R188 Other ascites: Secondary | ICD-10-CM

## 2012-11-25 DIAGNOSIS — K56600 Partial intestinal obstruction, unspecified as to cause: Secondary | ICD-10-CM

## 2012-11-25 DIAGNOSIS — R140 Abdominal distension (gaseous): Secondary | ICD-10-CM

## 2012-11-25 DIAGNOSIS — C569 Malignant neoplasm of unspecified ovary: Secondary | ICD-10-CM

## 2012-11-25 DIAGNOSIS — K56609 Unspecified intestinal obstruction, unspecified as to partial versus complete obstruction: Secondary | ICD-10-CM

## 2012-11-25 MED ORDER — OXYCODONE-ACETAMINOPHEN 5-325 MG PO TABS: 1.0000 | ORAL_TABLET | Freq: Four times a day (QID) | ORAL | Status: DC | PRN

## 2012-11-25 NOTE — Progress Notes (Signed)
[name] and Stebbins are here: Darnesha is too sick to come in. They need more help at home: HHA Thinking of palliative care Discussed

## 2012-11-26 ENCOUNTER — Telehealth: Payer: Self-pay | Admitting: *Deleted

## 2012-11-26 NOTE — Telephone Encounter (Signed)
Placed referral with [facility] to evaluate/treat for hospice services. They will contact patient or her spouse.

## 2012-12-07 ENCOUNTER — Encounter: Payer: Self-pay | Admitting: Oncology

## 2012-12-08 ENCOUNTER — Encounter (HOSPITAL_COMMUNITY)
Admission: RE | Admit: 2012-12-08 | Discharge: 2012-12-08 | Disposition: A | Discharge status: Home / Self Care | Payer: Medicare Other | Source: Ambulatory Visit | Attending: Oncology | Admitting: Oncology

## 2012-12-08 ENCOUNTER — Ambulatory Visit (HOSPITAL_BASED_OUTPATIENT_CLINIC_OR_DEPARTMENT_OTHER): Payer: Medicare Other | Admitting: Physician Assistant

## 2012-12-08 ENCOUNTER — Telehealth: Payer: Self-pay | Admitting: Oncology

## 2012-12-08 ENCOUNTER — Ambulatory Visit: Payer: Medicare Other | Admitting: Lab

## 2012-12-08 ENCOUNTER — Encounter: Payer: Self-pay | Admitting: Physician Assistant

## 2012-12-08 ENCOUNTER — Other Ambulatory Visit (HOSPITAL_BASED_OUTPATIENT_CLINIC_OR_DEPARTMENT_OTHER): Payer: Medicare Other | Admitting: Lab

## 2012-12-08 ENCOUNTER — Other Ambulatory Visit: Payer: Self-pay | Admitting: Oncology

## 2012-12-08 ENCOUNTER — Ambulatory Visit (HOSPITAL_BASED_OUTPATIENT_CLINIC_OR_DEPARTMENT_OTHER): Payer: Medicare Other

## 2012-12-08 ENCOUNTER — Other Ambulatory Visit: Payer: Medicare Other | Admitting: Lab

## 2012-12-08 VITALS — BP 101/66 | HR 109 | Temp 98.9°F | Resp 20 | Ht 64.0 in | Wt 122.5 lb

## 2012-12-08 DIAGNOSIS — D649 Anemia, unspecified: Secondary | ICD-10-CM

## 2012-12-08 DIAGNOSIS — C569 Malignant neoplasm of unspecified ovary: Secondary | ICD-10-CM

## 2012-12-08 DIAGNOSIS — D630 Anemia in neoplastic disease: Secondary | ICD-10-CM

## 2012-12-08 DIAGNOSIS — K5900 Constipation, unspecified: Secondary | ICD-10-CM

## 2012-12-08 DIAGNOSIS — Z853 Personal history of malignant neoplasm of breast: Secondary | ICD-10-CM

## 2012-12-08 DIAGNOSIS — Z5111 Encounter for antineoplastic chemotherapy: Secondary | ICD-10-CM

## 2012-12-08 DIAGNOSIS — R180 Malignant ascites: Secondary | ICD-10-CM

## 2012-12-08 LAB — CBC WITH DIFFERENTIAL/PLATELET
Basophils Absolute: 0.1 10*3/uL (ref 0.0–0.1)
Eosinophils Absolute: 0.2 10*3/uL (ref 0.0–0.5)
HGB: 8.3 g/dL — ABNORMAL LOW (ref 11.6–15.9)
MCV: 87.6 fL (ref 79.5–101.0)
MONO#: 1.5 10*3/uL — ABNORMAL HIGH (ref 0.1–0.9)
MONO%: 14.1 % — ABNORMAL HIGH (ref 0.0–14.0)
NEUT#: 6.3 10*3/uL (ref 1.5–6.5)
RDW: 16.7 % — ABNORMAL HIGH (ref 11.2–14.5)
WBC: 10.5 10*3/uL — ABNORMAL HIGH (ref 3.9–10.3)
lymph#: 2.5 10*3/uL (ref 0.9–3.3)
nRBC: 0 % (ref 0–0)

## 2012-12-08 LAB — COMPREHENSIVE METABOLIC PANEL (CC13)
ALT: 33 U/L (ref 0–55)
AST: 27 U/L (ref 5–34)
Albumin: 1.8 g/dL — ABNORMAL LOW (ref 3.5–5.0)
CO2: 26 mEq/L (ref 22–29)
Calcium: 9 mg/dL (ref 8.4–10.4)
Chloride: 99 mEq/L (ref 98–107)
Potassium: 4.4 mEq/L (ref 3.5–5.1)
Sodium: 135 mEq/L — ABNORMAL LOW (ref 136–145)
Total Protein: 6.5 g/dL (ref 6.4–8.3)

## 2012-12-08 LAB — CA 125: CA 125: 515.9 U/mL — ABNORMAL HIGH (ref 0.0–30.2)

## 2012-12-08 MED ORDER — SODIUM CHLORIDE 0.9 % IV SOLN
Freq: Once | INTRAVENOUS | Status: AC
  Administered 2012-12-08: 12:00:00 via INTRAVENOUS

## 2012-12-08 MED ORDER — DEXAMETHASONE SODIUM PHOSPHATE 10 MG/ML IJ SOLN
10.0000 mg | Freq: Once | INTRAMUSCULAR | Status: AC
  Administered 2012-12-08: 10 mg via INTRAVENOUS

## 2012-12-08 MED ORDER — DOXORUBICIN HCL LIPOSOMAL CHEMO INJECTION 2 MG/ML
40.0000 mg/m2 | Freq: Once | INTRAVENOUS | Status: AC
  Administered 2012-12-08: 66 mg via INTRAVENOUS
  Filled 2012-12-08: qty 33

## 2012-12-08 MED ORDER — ONDANSETRON 8 MG/50ML IVPB (CHCC)
8.0000 mg | Freq: Once | INTRAVENOUS | Status: AC
  Administered 2012-12-08: 8 mg via INTRAVENOUS

## 2012-12-08 MED ORDER — HEPARIN SOD (PORK) LOCK FLUSH 100 UNIT/ML IV SOLN
500.0000 [IU] | Freq: Once | INTRAVENOUS | Status: AC | PRN
  Administered 2012-12-08: 500 [IU]
  Filled 2012-12-08: qty 5

## 2012-12-08 MED ORDER — SODIUM CHLORIDE 0.9 % IJ SOLN
10.0000 mL | INTRAMUSCULAR | Status: DC | PRN
  Administered 2012-12-08: 10 mL
  Filled 2012-12-08: qty 10

## 2012-12-08 NOTE — Patient Instructions (Signed)
Patient aware of next appointment; discharged home with no complaints. 

## 2012-12-08 NOTE — Progress Notes (Signed)
ID: [name] [name]   DOB: 1941-04-04  MR#: [id number]  CSN#:[id number]  PCP:  GYN: Laurette Schimke, MD SU: [name] OTHER MD:  HISTORY OF PRESENT ILLNESS: [name] [name] is a 72 year-old [place] woman I followed remotely for a history of breast cancer. More recently she noted that her belly was getting "bigger". Gradually she has developed increasing low back pain. After a period of several months, as her symptoms increased, she brought this problem to her primary physician's attention and a KUB was obtained on 10/07/2011, which was unremarkable. This was followed by a CT scan 10/15/2011, which showed ascites and omental caking. Paracentesis 10/18/2011 showed ([id number]) malignant cells consistent with metastatic adenocarcinoma, with equivocal staining for estrogen receptor. The pattern of additional stains was most suggestive of a primary gynecologic tumor. CA 125 at this time was 2051.  The patient was referred to gynecologic oncology and on 10/29/2011 underwent exploratory laparotomy under Drs. Cleda Mccreedy and Antionette Char. This consisted of a total abdominal hysterectomy with bilateral salpingo-oophorectomy, omentectomy, and radical tumor debulking. Unfortunately there was significant tumor attached to the diaphragm so the patient was suboptimally debulked. Accordingly a peritoneal catheter was not placed. GYN-ONC recommended 6 cycles of carboplatin/paclitaxel given every 3 weeks, with Neulasta on day 2 for granulocyte support. Sada's subsequent history is as detailed below.  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 She is due for day 1 cycle 4 of Doxil being given every 3 weeks.   Interval history is generally unremarkable and [name] is actually feeling much better today than when I saw her 3 weeks ago. She is getting occupational therapy and physical therapy at home. She was walking with assistance  at home, although she is in a wheelchair here in our office today. She is off her TPN during the day which "frees her up a little". Her Aspira drain is being emptied every 3 days, collecting approximately 500 cc.  [name] is eating a little better. She's had no problems with nausea. Her bowel movements are "good and normal", and she denies any increased constipation or abnormal bleeding. She's had no abdominal or pelvic pain. She has some occasional lower back pain for which she takes an occasional dose of Percocet.  REVIEW OF SYSTEMS:  [name] has had no fevers or chills. She continues to have hot flashes. She notes no skin changes, rashes, or evidence of  PPE.  She's had no mouth ulcers or oral sensitivity. She denies any signs of abnormal bruising or bleeding. She has no significant cough, no phlegm production, but continues to have shortness of breath with even mild exertion. She does feel very weak and tired, with even minor exertion. She's had no chest pain or palpitations. She denies abnormal headaches or dizziness.   A detailed view of systems is otherwise stable and noncontributory.   PAST MEDICAL HISTORY: Past Medical History  Diagnosis Date  . History of breast cancer   . COPD (chronic obstructive pulmonary disease)   . GERD (gastroesophageal reflux disease)   . Diabetes mellitus     type II  . Hx of colonic polyps   . Osteoarthritis   . MVP (mitral valve prolapse)   . Dysrhythmia     pt states runs a little fast   . Shortness of breath     with exertion   . Depression   . Anxiety   . Heart murmur   . Cancer  left breast  . Ovarian cancer     active chemotherapy  . Stroke   . Ascites 10/22/2012    PAST SURGICAL HISTORY: Past Surgical History  Procedure Laterality Date  . Cesarean section  1980, 1982    X 2   . Laparoscopy      work up for infertility  . Tympanoplasty      left X 2  . Left breast fibroadenoma removal  1960's  . Breast cyst aspirations    .  Left modified radial mastectomy    . Laparotomy  10/29/2011    Procedure: EXPLORATORY LAPAROTOMY;  Surgeon: Rejeana Brock A. Duard Brady, MD;  Location: [facility];  Service: Gynecology;  Laterality: N/A;  . Abdominal hysterectomy  10/29/2011    Procedure: HYSTERECTOMY ABDOMINAL;  Surgeon: Rejeana Brock A. Duard Brady, MD;  Location: [facility];  Service: Gynecology;  Laterality: N/A;  Total abdominal hysterectomy  . Salpingoophorectomy  10/29/2011    Procedure: SALPINGO OOPHERECTOMY;  Surgeon: Rejeana Brock A. Duard Brady, MD;  Location: [facility];  Service: Gynecology;  Laterality: Bilateral;    FAMILY HISTORY Family History  Problem Relation Age of Onset  . Cancer Mother     endometrial/ colon  . Cancer Brother     colon  . COPD Other   . Hypertension Other   . Cancer Maternal Uncle     prostate/bladder/lung  . Cancer Paternal Aunt     unknown abdominal cancer  . Cancer Maternal Grandmother     Salivary gland cancer  . Cancer Paternal Aunt     leukemia   the patient's father died at the age of 84. The patient's mother died at the age of 53; she had a history of colon cancer and endometrial cancer. The patient has no sisters. She has one brother, with a history of colon cancer. There is no history of breast cancer in the family. The patient is scheduled for genetic testing later this month.  GYNECOLOGIC HISTORY: She had menarche age 63. First pregnancy to term was at age 72. She is GX P2. She became menopausal at the time of her chemotherapy for breast cancer. This was age 61. 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 Her husband [name] runs a [employer], sport and exercise out of their own home. Daughter [name] [name], 52,  currently lives at home with the patient. There are some issues relating to this daughter that the patient discussed briefly. [place]ter. [name] [name], 30, is an ED nurse at [employer]. The patient has no grandchildren. She is not a Advice worker.   ADVANCED DIRECTIVES:  in place  HEALTH MAINTENANCE: History  Substance Use Topics  . Smoking status: Current Some Day Smoker -- 0.50 packs/day for 30 years    Types: Cigarettes  . Smokeless tobacco: Never Used     Comment: quit again 2009; restarted 1 ppd 2011  . Alcohol Use: No     Colonoscopy: 2009  PAP: s/p hysterectomy  Bone density: 2010. "good"  Mammography: 2012 NOV--unremarkable  Allergies  Allergen Reactions  . Promethazine     "Makes me feel like I want to jump out of my skin"    Current Outpatient Prescriptions  Medication Sig Dispense Refill  . albuterol-ipratropium (COMBIVENT) 18-103 MCG/ACT inhaler Inhale 2 puffs into the lungs every 4 (four) hours as needed. For wheezing  14.7 Inhaler  3  . Alum & Mag Hydroxide-Simeth (MAGIC MOUTHWASH W/LIDOCAINE) SOLN Take 5 mLs by mouth 4 (four) times daily as needed.  250  mL  1  . atenolol (TENORMIN) 25 MG tablet Take 12.5 mg by mouth 2 (two) times daily.      . budesonide-formoterol (SYMBICORT) 160-4.5 MCG/ACT inhaler Inhale 2 puffs into the lungs 2 (two) times daily.      Marland Kitchen buPROPion (WELLBUTRIN SR) 150 MG 12 hr tablet Take 1 tablet (150 mg total) by mouth 2 (two) times daily.  180 tablet  3  . busPIRone (BUSPAR) 15 MG tablet Take 1 tablet (15 mg total) by mouth 2 (two) times daily.  180 tablet  2  . clopidogrel (PLAVIX) 75 MG tablet Take 1 tablet (75 mg total) by mouth daily with breakfast.  60 tablet  3  . diazepam (VALIUM) 5 MG tablet TAKE 1 TO 2 TABLETS BY MOUTH EVERY NIGHTAT BEDRIME AS NEEDED FOR INSOMNIA  60 tablet  2  . lovastatin (MEVACOR) 40 MG tablet Take 1 tablet (40 mg total) by mouth daily.  90 tablet  2  . morphine 20 MG/5ML solution Take 2.5 mLs (10 mg total) by mouth every 3 (three) hours as needed for pain.  100 mL  0  . omeprazole (PRILOSEC) 40 MG capsule Take 1 capsule (40 mg total) by mouth daily.  90 capsule  3  . ondansetron (ZOFRAN) 8 MG tablet Take 1 tablet (8 mg total) by mouth 2 (two) times daily. Take two times a day  starting the day after chemo for 2 days. Then take two times a day as needed for nausea or vomiting.  30 tablet  1  . ONE TOUCH ULTRA TEST test strip USE AS DIRECTED  100 each  0  . oxyCODONE-acetaminophen (PERCOCET/ROXICET) 5-325 MG per tablet Take 1 tablet by mouth every 6 (six) hours as needed for pain.  100 tablet  0  . prochlorperazine (COMPAZINE) 10 MG tablet Take 1 tablet (10 mg total) by mouth every 6 (six) hours as needed (Nausea or vomiting).  30 tablet  1  . sertraline (ZOLOFT) 100 MG tablet Take 1 tablet (100 mg total) by mouth daily before breakfast.  90 tablet  3  . [DISCONTINUED] busPIRone (BUSPAR) 15 MG tablet Take 1 tablet (15 mg total) by mouth 2 (two) times daily.  180 tablet  1  . [DISCONTINUED] roflumilast (DALIRESP) 500 MCG TABS tablet Take 500 mcg by mouth daily.         No current facility-administered medications for this visit.   OBJECTIVE: Older white woman who appears fatigued and is examined in a wheelchair today  Filed Vitals:   12/08/12 1048  BP: 101/66  Pulse: 109  Temp: 98.9 F (37.2 C)  Resp: 20     Body mass index is 21.02 kg/(m^2).    ECOG FS: 2 Filed Weights   12/08/12 1048  Weight: 122 lb 8 oz (55.566 kg)   Sclerae unicteric Oropharynx clear,  no ulcerations, buccal mucosa is moist Lungs diminished breath sounds bilaterally in the bases, No wheezes or rhonchi Heart regular rate and rhythm Abdomen soft, nontender, positive bowel sounds, Aspira drain is in the lower right abdomen, with clean dry dressing intact. MSK  no peripheral edema Neuro: nonfocal, well oriented, tired affect Breasts: Deferred.    LAB RESULTS: Lab Results  Component Value Date   WBC 10.5* 12/08/2012   NEUTROABS 6.3 12/08/2012   HGB 8.3* 12/08/2012   HCT 26.2* 12/08/2012   MCV 87.6 12/08/2012   PLT 762* 12/08/2012      Chemistry      Component Value Date/Time   NA  134* 11/17/2012 1055   NA 132* 11/13/2012 0540   K 4.4 11/17/2012 1055   K 4.0 11/13/2012 0540   CL 96* 11/17/2012  1055   CL 100 11/13/2012 0540   CO2 30* 11/17/2012 1055   CO2 25 11/13/2012 0540   BUN 17.7 11/17/2012 1055   BUN 13 11/13/2012 0540   CREATININE 0.6 11/17/2012 1055   CREATININE 0.49* 11/13/2012 0540      Component Value Date/Time   CALCIUM 8.7 11/17/2012 1055   CALCIUM 7.7* 11/13/2012 0540   ALKPHOS 94 11/17/2012 1055   ALKPHOS 79 11/12/2012 0426   AST 24 11/17/2012 1055   AST 10 11/12/2012 0426   ALT 21 11/17/2012 1055   ALT 7 11/12/2012 0426   BILITOT <0.20 Repeated and Verified 11/17/2012 1055   BILITOT 0.1* 11/12/2012 0426    Results for QUINCEY, NORED (MRN [id number]) as of 10/01/2012 10:00  Ref. Range 05/05/2012 10:00 05/18/2012 10:29 06/17/2012 10:12 07/06/2012 12:37 09/21/2012 11:50  CA 125 Latest Range: 0.0-30.2 U/mL 38.8 (H) 37.5 (H) 33.3 (H) 35.7 (H) 879.3 (H)    STUDIES:  No results found.    ASSESSMENT: 72 y.o.  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 [V323G (1196T>G)]    (1) status post left modified radical mastectomy October 1993 for a T2 N0 invasive ductal breast, estrogen and progesterone receptor positive cancer treated adjuvantly with MFL (methotrexate/fluorouracil/leucovorin) chemotherapy x6, completed April 1994, followed by 5 years of tamoxifen completed April 1999, with no evidence of recurrence  (2) now status post TAH BSO, omentectomy, and suboptimal debulking 10/29/2011 for a high-grade serous adenocarcinoma of the ovary, pT3c NX (Stage IIIC) , s/p eight cycles of carboplatin/paclitaxel completed 05/05/2012 (initial CA125 of 2050.7 normalized after cycle 5)  (3) recurrent disease documented by rising CEA 125 an abdominal CT scan 09/21/2012.  (4) COPD with ongoing tobacco abuse  (5) diabetes mellitus  (6) genetic testing for Lynch syndrome pending.  (7)  status post hospitalization in July 2013 for stroke/small acute infarcts in the left sub insular white matter and coronal radiata.  (8) port in place  (9) left  upper lobe 6 mm subpleural nodule, without associated hypermetabolic activity (likely well below the resolution of PET imaging) noted August 2013  (10) CTs obtained in [place] 2014 showed evidence of significant progression of disease with widespread intraperitoneal metastases and a moderate volume of presumably malignant ascites.  (11) currently being treated with Doxil on a Q. three-week basis, first dose given on 10/06/2012  (12) cholecystitis, with cholecystectomy pending  (13)  recurring ascites, requiring therapeutic paracentesis, and pending placement of Aspira drain   PLAN:  This case was reviewed with Dr. Darnelle Catalan.  Sarea will proceed to treatment today as scheduled for day 1 cycle 4 of Doxil. She's also scheduled for repeat KUB today. She will return tomorrow for a blood transfusion for symptomatic anemia and a hemoglobin of 8.3. I will see her again with repeat labs in 1 week, May 13.  The plan is to restage after 4 cycles, and a CT has been scheduled for next week, May 14. She'll be seeing Dr. Darnelle Catalan the following week to review those results and discuss her treatment plan.  Loistine Simas and [place] both voice understanding and agreement with this plan, and will call with any changes or problems.   Maryjean Corpening    12/08/2012

## 2012-12-09 ENCOUNTER — Ambulatory Visit (HOSPITAL_BASED_OUTPATIENT_CLINIC_OR_DEPARTMENT_OTHER): Payer: Medicare Other

## 2012-12-09 ENCOUNTER — Ambulatory Visit (HOSPITAL_COMMUNITY)
Admission: RE | Admit: 2012-12-09 | Discharge: 2012-12-09 | Disposition: A | Discharge status: Home / Self Care | Payer: Medicare Other | Source: Ambulatory Visit | Attending: Physician Assistant | Admitting: Physician Assistant

## 2012-12-09 VITALS — BP 112/57 | HR 88 | Temp 97.8°F | Resp 18

## 2012-12-09 DIAGNOSIS — R109 Unspecified abdominal pain: Secondary | ICD-10-CM | POA: Insufficient documentation

## 2012-12-09 DIAGNOSIS — R140 Abdominal distension (gaseous): Secondary | ICD-10-CM

## 2012-12-09 DIAGNOSIS — D649 Anemia, unspecified: Secondary | ICD-10-CM

## 2012-12-09 DIAGNOSIS — K56600 Partial intestinal obstruction, unspecified as to cause: Secondary | ICD-10-CM

## 2012-12-09 DIAGNOSIS — M539 Dorsopathy, unspecified: Secondary | ICD-10-CM | POA: Insufficient documentation

## 2012-12-09 DIAGNOSIS — R188 Other ascites: Secondary | ICD-10-CM

## 2012-12-09 DIAGNOSIS — C569 Malignant neoplasm of unspecified ovary: Secondary | ICD-10-CM | POA: Insufficient documentation

## 2012-12-09 DIAGNOSIS — D630 Anemia in neoplastic disease: Secondary | ICD-10-CM

## 2012-12-09 LAB — PREPARE RBC (CROSSMATCH)

## 2012-12-09 MED ORDER — DIPHENHYDRAMINE HCL 25 MG PO CAPS
25.0000 mg | ORAL_CAPSULE | Freq: Once | ORAL | Status: AC
  Administered 2012-12-09: 25 mg via ORAL

## 2012-12-09 MED ORDER — HEPARIN SOD (PORK) LOCK FLUSH 100 UNIT/ML IV SOLN
250.0000 [IU] | INTRAVENOUS | Status: AC | PRN
  Administered 2012-12-09: 250 [IU]
  Filled 2012-12-09: qty 5

## 2012-12-09 MED ORDER — SODIUM CHLORIDE 0.9 % IJ SOLN
10.0000 mL | INTRAMUSCULAR | Status: AC | PRN
  Administered 2012-12-09: 10 mL
  Filled 2012-12-09: qty 10

## 2012-12-09 MED ORDER — SODIUM CHLORIDE 0.9 % IV SOLN
250.0000 mL | Freq: Once | INTRAVENOUS | Status: AC
  Administered 2012-12-09: 250 mL via INTRAVENOUS

## 2012-12-09 MED ORDER — ACETAMINOPHEN 325 MG PO TABS
650.0000 mg | ORAL_TABLET | Freq: Once | ORAL | Status: AC
  Administered 2012-12-09: 650 mg via ORAL

## 2012-12-09 NOTE — Patient Instructions (Addendum)
Blood Transfusion Information WHAT IS A BLOOD TRANSFUSION? A transfusion is the replacement of blood or some of its parts. Blood is made up of multiple cells which provide different functions.  Red blood cells carry oxygen and are used for blood loss replacement.  White blood cells fight against infection.  Platelets control bleeding.  Plasma helps clot blood.  Other blood products are available for specialized needs, such as hemophilia or other clotting disorders. BEFORE THE TRANSFUSION  Who gives blood for transfusions?   You may be able to donate blood to be used at a later date on yourself (autologous donation).  Relatives can be asked to donate blood. This is generally not any safer than if you have received blood from a stranger. The same precautions are taken to ensure safety when a relative's blood is donated.  Healthy volunteers who are fully evaluated to make sure their blood is safe. This is blood bank blood. Transfusion therapy is the safest it has ever been in the practice of medicine. Before blood is taken from a donor, a complete history is taken to make sure that person has no history of diseases nor engages in risky social behavior (examples are intravenous drug use or sexual activity with multiple partners). The donor's travel history is screened to minimize risk of transmitting infections, such as malaria. The donated blood is tested for signs of infectious diseases, such as HIV and hepatitis. The blood is then tested to be sure it is compatible with you in order to minimize the chance of a transfusion reaction. If you or a relative donates blood, this is often done in anticipation of surgery and is not appropriate for emergency situations. It takes many days to process the donated blood. RISKS AND COMPLICATIONS Although transfusion therapy is very safe and saves many lives, the main dangers of transfusion include:   Getting an infectious disease.  Developing a  transfusion reaction. This is an allergic reaction to something in the blood you were given. Every precaution is taken to prevent this. The decision to have a blood transfusion has been considered carefully by your caregiver before blood is given. Blood is not given unless the benefits outweigh the risks. AFTER THE TRANSFUSION  Right after receiving a blood transfusion, you will usually feel much better and more energetic. This is especially true if your red blood cells have gotten low (anemic). The transfusion raises the level of the red blood cells which carry oxygen, and this usually causes an energy increase.  The nurse administering the transfusion will monitor you carefully for complications. HOME CARE INSTRUCTIONS  No special instructions are needed after a transfusion. You may find your energy is better. Speak with your caregiver about any limitations on activity for underlying diseases you may have. SEEK MEDICAL CARE IF:   Your condition is not improving after your transfusion.  You develop redness or irritation at the intravenous (IV) site. SEEK IMMEDIATE MEDICAL CARE IF:  Any of the following symptoms occur over the next 12 hours:  Shaking chills.  You have a temperature by mouth above 102 F (38.9 C), not controlled by medicine.  Chest, back, or muscle pain.  People around you feel you are not acting correctly or are confused.  Shortness of breath or difficulty breathing.  Dizziness and fainting.  You get a rash or develop hives.  You have a decrease in urine output.  Your urine turns a dark color or changes to pink, red, or brown. Any of the following   symptoms occur over the next 10 days:  You have a temperature by mouth above 102 F (38.9 C), not controlled by medicine.  Shortness of breath.  Weakness after normal activity.  The white part of the eye turns yellow (jaundice).  You have a decrease in the amount of urine or are urinating less often.  Your  urine turns a dark color or changes to pink, red, or brown. Document Released: 07/19/2000 Document Revised: 10/14/2011 Document Reviewed: 03/07/2008 [employer] Patient Information 2013 [employer].  

## 2012-12-10 LAB — TYPE AND SCREEN
Unit division: 0
Unit division: 0
Unit division: 0

## 2012-12-12 ENCOUNTER — Other Ambulatory Visit: Payer: Self-pay | Admitting: Internal Medicine

## 2012-12-15 ENCOUNTER — Emergency Department (HOSPITAL_COMMUNITY): Payer: Medicare Other

## 2012-12-15 ENCOUNTER — Emergency Department (HOSPITAL_COMMUNITY)
Admission: EM | Admit: 2012-12-15 | Discharge: 2012-12-15 | Disposition: A | Discharge status: Home / Self Care | Payer: Medicare Other | Attending: Emergency Medicine | Admitting: Emergency Medicine

## 2012-12-15 ENCOUNTER — Encounter: Payer: Self-pay | Admitting: Physician Assistant

## 2012-12-15 ENCOUNTER — Ambulatory Visit (HOSPITAL_BASED_OUTPATIENT_CLINIC_OR_DEPARTMENT_OTHER): Payer: Medicare Other | Admitting: Physician Assistant

## 2012-12-15 ENCOUNTER — Encounter (HOSPITAL_COMMUNITY): Payer: Self-pay | Admitting: *Deleted

## 2012-12-15 ENCOUNTER — Other Ambulatory Visit (HOSPITAL_BASED_OUTPATIENT_CLINIC_OR_DEPARTMENT_OTHER): Payer: Medicare Other | Admitting: Lab

## 2012-12-15 VITALS — BP 108/67 | HR 96 | Temp 98.2°F | Resp 20 | Ht 64.0 in | Wt 125.1 lb

## 2012-12-15 DIAGNOSIS — J449 Chronic obstructive pulmonary disease, unspecified: Secondary | ICD-10-CM | POA: Insufficient documentation

## 2012-12-15 DIAGNOSIS — Z853 Personal history of malignant neoplasm of breast: Secondary | ICD-10-CM | POA: Insufficient documentation

## 2012-12-15 DIAGNOSIS — Z8679 Personal history of other diseases of the circulatory system: Secondary | ICD-10-CM | POA: Insufficient documentation

## 2012-12-15 DIAGNOSIS — E119 Type 2 diabetes mellitus without complications: Secondary | ICD-10-CM | POA: Insufficient documentation

## 2012-12-15 DIAGNOSIS — Z7902 Long term (current) use of antithrombotics/antiplatelets: Secondary | ICD-10-CM | POA: Insufficient documentation

## 2012-12-15 DIAGNOSIS — Z8709 Personal history of other diseases of the respiratory system: Secondary | ICD-10-CM | POA: Insufficient documentation

## 2012-12-15 DIAGNOSIS — F411 Generalized anxiety disorder: Secondary | ICD-10-CM | POA: Insufficient documentation

## 2012-12-15 DIAGNOSIS — C569 Malignant neoplasm of unspecified ovary: Secondary | ICD-10-CM

## 2012-12-15 DIAGNOSIS — F3289 Other specified depressive episodes: Secondary | ICD-10-CM | POA: Insufficient documentation

## 2012-12-15 DIAGNOSIS — R188 Other ascites: Secondary | ICD-10-CM

## 2012-12-15 DIAGNOSIS — Z79899 Other long term (current) drug therapy: Secondary | ICD-10-CM | POA: Insufficient documentation

## 2012-12-15 DIAGNOSIS — K219 Gastro-esophageal reflux disease without esophagitis: Secondary | ICD-10-CM | POA: Insufficient documentation

## 2012-12-15 DIAGNOSIS — N6459 Other signs and symptoms in breast: Secondary | ICD-10-CM

## 2012-12-15 DIAGNOSIS — N644 Mastodynia: Secondary | ICD-10-CM

## 2012-12-15 DIAGNOSIS — Z860100 Personal history of colon polyps, unspecified: Secondary | ICD-10-CM | POA: Insufficient documentation

## 2012-12-15 DIAGNOSIS — Z9889 Other specified postprocedural states: Secondary | ICD-10-CM | POA: Insufficient documentation

## 2012-12-15 DIAGNOSIS — R599 Enlarged lymph nodes, unspecified: Secondary | ICD-10-CM

## 2012-12-15 DIAGNOSIS — F329 Major depressive disorder, single episode, unspecified: Secondary | ICD-10-CM | POA: Insufficient documentation

## 2012-12-15 DIAGNOSIS — T8089XA Other complications following infusion, transfusion and therapeutic injection, initial encounter: Secondary | ICD-10-CM

## 2012-12-15 DIAGNOSIS — I059 Rheumatic mitral valve disease, unspecified: Secondary | ICD-10-CM | POA: Insufficient documentation

## 2012-12-15 DIAGNOSIS — K5900 Constipation, unspecified: Secondary | ICD-10-CM

## 2012-12-15 DIAGNOSIS — M1990 Unspecified osteoarthritis, unspecified site: Secondary | ICD-10-CM | POA: Insufficient documentation

## 2012-12-15 DIAGNOSIS — J4489 Other specified chronic obstructive pulmonary disease: Secondary | ICD-10-CM | POA: Insufficient documentation

## 2012-12-15 DIAGNOSIS — F17200 Nicotine dependence, unspecified, uncomplicated: Secondary | ICD-10-CM | POA: Insufficient documentation

## 2012-12-15 DIAGNOSIS — Z8673 Personal history of transient ischemic attack (TIA), and cerebral infarction without residual deficits: Secondary | ICD-10-CM | POA: Insufficient documentation

## 2012-12-15 DIAGNOSIS — Z9010 Acquired absence of unspecified breast and nipple: Secondary | ICD-10-CM | POA: Insufficient documentation

## 2012-12-15 DIAGNOSIS — N61 Mastitis without abscess: Secondary | ICD-10-CM | POA: Insufficient documentation

## 2012-12-15 DIAGNOSIS — J90 Pleural effusion, not elsewhere classified: Secondary | ICD-10-CM

## 2012-12-15 DIAGNOSIS — R180 Malignant ascites: Secondary | ICD-10-CM

## 2012-12-15 DIAGNOSIS — R011 Cardiac murmur, unspecified: Secondary | ICD-10-CM | POA: Insufficient documentation

## 2012-12-15 DIAGNOSIS — Z8719 Personal history of other diseases of the digestive system: Secondary | ICD-10-CM | POA: Insufficient documentation

## 2012-12-15 DIAGNOSIS — D649 Anemia, unspecified: Secondary | ICD-10-CM | POA: Insufficient documentation

## 2012-12-15 DIAGNOSIS — R140 Abdominal distension (gaseous): Secondary | ICD-10-CM

## 2012-12-15 LAB — CBC WITH DIFFERENTIAL/PLATELET
BASO%: 0.6 % (ref 0.0–2.0)
Basophils Absolute: 0 10*3/uL (ref 0.0–0.1)
Basophils Relative: 0 % (ref 0–1)
Eosinophils Absolute: 0.1 10*3/uL (ref 0.0–0.5)
Eosinophils Relative: 1 % (ref 0–5)
HCT: 31.7 % — ABNORMAL LOW (ref 36.0–46.0)
LYMPH%: 13.7 % — ABNORMAL LOW (ref 14.0–49.7)
MCHC: 32.7 g/dL (ref 31.5–36.0)
MCHC: 32.5 g/dL (ref 30.0–36.0)
MCV: 87.8 fL (ref 78.0–100.0)
MONO#: 0.8 10*3/uL (ref 0.1–0.9)
Monocytes Absolute: 0.6 10*3/uL (ref 0.1–1.0)
NEUT#: 8.5 10*3/uL — ABNORMAL HIGH (ref 1.5–6.5)
Platelets: 679 10*3/uL — ABNORMAL HIGH (ref 145–400)
Platelets: 642 10*3/uL — ABNORMAL HIGH (ref 150–400)
RBC: 3.61 10*6/uL — ABNORMAL LOW (ref 3.70–5.45)
RDW: 16.8 % — ABNORMAL HIGH (ref 11.2–14.5)
RDW: 16.3 % — ABNORMAL HIGH (ref 11.5–15.5)
WBC: 11 10*3/uL — ABNORMAL HIGH (ref 3.9–10.3)

## 2012-12-15 LAB — COMPREHENSIVE METABOLIC PANEL (CC13)
ALT: 27 U/L (ref 0–55)
AST: 26 U/L (ref 5–34)
CO2: 25 mEq/L (ref 22–29)
Calcium: 9.4 mg/dL (ref 8.4–10.4)
Chloride: 101 mEq/L (ref 98–107)
Creatinine: 0.6 mg/dL (ref 0.6–1.1)
Sodium: 138 mEq/L (ref 136–145)
Total Bilirubin: <0.2 mg/dL (ref 0.20–1.20)
Total Protein: 7.1 g/dL (ref 6.4–8.3)

## 2012-12-15 LAB — BASIC METABOLIC PANEL
Calcium: 9.1 mg/dL (ref 8.4–10.5)
Creatinine, Ser: 0.47 mg/dL — ABNORMAL LOW (ref 0.50–1.10)
GFR calc Af Amer: >90 mL/min (ref >90)
GFR calc non Af Amer: >90 mL/min (ref >90)
Sodium: 137 mEq/L (ref 135–145)

## 2012-12-15 LAB — CA 125: CA 125: 475.8 U/mL — ABNORMAL HIGH (ref 0.0–30.2)

## 2012-12-15 LAB — GLUCOSE, CAPILLARY: Glucose-Capillary: 79 mg/dL (ref 70–99)

## 2012-12-15 MED ORDER — ALBUTEROL SULFATE (5 MG/ML) 0.5% IN NEBU
2.5000 mg | INHALATION_SOLUTION | Freq: Four times a day (QID) | RESPIRATORY_TRACT | Status: DC
  Administered 2012-12-15: 2.5 mg via RESPIRATORY_TRACT
  Filled 2012-12-15: qty 0.5

## 2012-12-15 MED ORDER — IPRATROPIUM BROMIDE 0.02 % IN SOLN
0.5000 mg | Freq: Four times a day (QID) | RESPIRATORY_TRACT | Status: DC
  Administered 2012-12-15: 0.5 mg via RESPIRATORY_TRACT
  Filled 2012-12-15: qty 2.5

## 2012-12-15 NOTE — Progress Notes (Signed)
Pt came in for visit with midlevel - stating " I think I pulled by port needle out and it's swollen ".  Pt receives overnight TPN per home care via R side port. [name] states port was accessed yesterday. She woke up during the night and " got up and went a little too far and felt the line pull ". Pt returned to bed and awoke with noted swelling and tenderness.  This RN assessed site - Site intacted with needle in skin with no oozing at site. Port site is tender to touch. Breast is enlarged and warm to touch, per pt and husband - breast is " quite larger than before "- pt has a left mastectomy therefore no breast for comparison. At first assessment skin was nonblanching. Pt has additional swelling on right side of chest under the arm ( but not axillary swelling ). Approximately 6in x 3in soft and warm.  This RN was unable to aspirate from IV line attached to needle. No attempt to flush was made.  During visit with PA and MD site increased in reddness with blanching.  This RN contacted appropriate personnel and followed recommendation by IV team. Needle removed without flush per recommendation.  Site assessed and was without any oozing or actual swelling at port site. This RN could palpate port with markers under skin.  Site left without dressing due to skin concern.  Per recommendation pt will proceed to the ER for consult by plastic surgeon for possible I&D or other follow up needs.

## 2012-12-15 NOTE — Consult Note (Signed)
[name] [name] September 22, 1940  [id number].    Requesting MD: Dr. Linwood Dibbles, EDP Chief Complaint/Reason for Consult: Breast pain and swelling, ?TPN infiltration HPI: 72 y/o white female with PMH left breast ca and ovarian ca. She is receiving chemotherapy for Ovarian cancer. Pt receives TPN at night and her right portacath needle, which is inserted by a home health nurse every Monday, was accidentally pulled out last night during infusion around 4am.  She felt the needle pull out a bit so she pushed it back in. The concern is that she had TPN infilatrated into her breast.  Pt was seen by the cancer center doctors today. They were concerned about the tenderness and redness that has developed in her right breast following episode. She was sent up to [facility] to see if any surgical interventions needed to be done.  The patient's husband notes her breast was completely swelled with fluid this morning, but has since tracked to her right side and back.  She is still quite tender around the right nipple and just superior to the nipple.  Pt denies fever/chills. No CP/SOB.   ROS: All systems reviewed and otherwise negative except for as above  Family History  Problem Relation Age of Onset  . Cancer Mother     endometrial/ colon  . Cancer Brother     colon  . COPD Other   . Hypertension Other   . Cancer Maternal Uncle     prostate/bladder/lung  . Cancer Paternal Aunt     unknown abdominal cancer  . Cancer Maternal Grandmother     Salivary gland cancer  . Cancer Paternal Aunt     leukemia    Past Medical History  Diagnosis Date  . History of breast cancer   . COPD (chronic obstructive pulmonary disease)   . GERD (gastroesophageal reflux disease)   . Diabetes mellitus     type II  . Hx of colonic polyps   . Osteoarthritis   . MVP (mitral valve prolapse)   . Dysrhythmia     pt states runs a little fast   . Shortness of breath     with exertion   . Depression   . Anxiety   . Heart murmur   .  Cancer     left breast  . Ovarian cancer     active chemotherapy  . Stroke   . Ascites 10/22/2012    Past Surgical History  Procedure Laterality Date  . Cesarean section  1980, 1982    X 2   . Laparoscopy      work up for infertility  . Tympanoplasty      left X 2  . Left breast fibroadenoma removal  1960's  . Breast cyst aspirations    . Left modified radial mastectomy      >20 years ago  . Laparotomy  10/29/2011    Procedure: EXPLORATORY LAPAROTOMY;  Surgeon: Rejeana Brock A. Duard Brady, MD;  Location: [facility];  Service: Gynecology;  Laterality: N/A;  . Abdominal hysterectomy  10/29/2011    Procedure: HYSTERECTOMY ABDOMINAL;  Surgeon: Rejeana Brock A. Duard Brady, MD;  Location: [facility];  Service: Gynecology;  Laterality: N/A;  Total abdominal hysterectomy  . Salpingoophorectomy  10/29/2011    Procedure: SALPINGO OOPHERECTOMY;  Surgeon: Rejeana Brock A. Duard Brady, MD;  Location: [facility];  Service: Gynecology;  Laterality: Bilateral;    Social History:  reports that she has been smoking Cigarettes.  She has a 15 pack-year smoking history. She has never used smokeless tobacco. She  reports that she does not drink alcohol or use illicit drugs.  Allergies:  Allergies  Allergen Reactions  . Promethazine     "Makes me feel like I want to jump out of my skin"     (Not in a hospital admission)  Blood pressure 134/70, pulse 97, temperature 98.1 F (36.7 C), temperature source Oral, resp. rate 20, SpO2 97.00%. Physical Exam: General: pleasant, WD/WN white female who is laying in bed in NAD HEENT: head is normocephalic, atraumatic.  Sclera are noninjected.  PERRL.  Ears and nose without any masses or lesions.  Mouth is pink and moist Right breast:  Areas of ulceration likely from tape irritation around the right port-a-cath.  Some minimal edema to the right breast and right axilla/side, tenderness around the nipple and areola and around the port site.  No tenderness in the right side/axilla/back.  No induration of  fluctuance, no significant areas of fluid collection Heart: regular, rate, and rhythm.  No obvious murmurs, gallops, or rubs noted.  Palpable pedal pulses bilaterally Lungs: CTAB, no wheezes, rhonchi, or rales noted.  Respiratory effort nonlabored, just had a breathing treatment prior to exam Abd: soft, NT/ND, +BS, no masses, hernias, or organomegaly MS: all 4 extremities are symmetrical with no cyanosis, clubbing, or edema. Skin: warm and dry with no masses, lesions, or rashes Psych: A&Ox3 with an appropriate affect.  Results for orders placed during the hospital encounter of 12/15/12 (from the past 48 hour(s))  GLUCOSE, CAPILLARY     Status: None   Collection Time    12/15/12  3:17 PM      Result Value Range   Glucose-Capillary 79  70 - 99 mg/dL   Dg Chest 2 View  04/18/7828  *RADIOLOGY REPORT*  Clinical Data: COPD.  History of left mastectomy.  History of ovarian carcinoma.  CHEST - 2 VIEW  Comparison: PA and lateral chest 09/24/2012.  Findings: Since the prior study, the patient has developed small bilateral pleural effusions, larger on the left.  Lungs are clear. Heart size is normal.  The patient is status post left mastectomy and axillary dissection.  Port-A-Cath again noted.  IMPRESSION: New small bilateral pleural effusions, larger on the left.  No other change.   Original Report Authenticated By: Holley Dexter, M.D.        Assessment/Plan Right breast edema/erythema/pain, possible infiltration of TPN to right breast Skin ulcerations - from tape adhesive 1.  Would advise monitoring for now, follow up with your cancer doctor or PCP by Thursday for a wound check 2.  If signs of infection appear return to ER for evaluation (redness, fever/chills, drainage, increase in pain) 3.  Try ice packs, NSAID, as well as a sports bra for support/compression 4.  Try staggering tape around port-a-cath to allow skin ulcerations to heal 5.  CXR shows port-a-cath in good position, have IV team  replace TPN needle into port-a-cath site to continue TPN at home 6.  If returns to the ER later this week, would recommend breast ultrasound for further evaluation  DORT, Arik Husmann 12/15/2012, 4:15 PM Pager: [phone]

## 2012-12-15 NOTE — Progress Notes (Signed)
[facility] ED CM consulted by Tommy Rainwater, [facility] home health coordinator about possible admission CM reviewed EPIC notes including EDP and surgery consult notes, labs and imaging Left a voice message for Baxter Hire related to disposition (Home)

## 2012-12-15 NOTE — ED Notes (Signed)
Pt pulled out port accidentally last night and now has TPN in R breast. Pt was sent from cancer center for plastic surgeon consult.

## 2012-12-15 NOTE — Progress Notes (Signed)
ID: [name] [name]   DOB: Jan 27, 1941  MR#: [id number]  CSN#:[id number]  PCP:  GYN: Laurette Schimke, MD SU: [name] OTHER MD:  HISTORY OF PRESENT ILLNESS: [name] [name] is a 72 year-old [place] woman I followed remotely for a history of breast cancer. More recently she noted that her belly was getting "bigger". Gradually she has developed increasing low back pain. After a period of several months, as her symptoms increased, she brought this problem to her primary physician's attention and a KUB was obtained on 10/07/2011, which was unremarkable. This was followed by a CT scan 10/15/2011, which showed ascites and omental caking. Paracentesis 10/18/2011 showed ([id number]) malignant cells consistent with metastatic adenocarcinoma, with equivocal staining for estrogen receptor. The pattern of additional stains was most suggestive of a primary gynecologic tumor. CA 125 at this time was 2051.  The patient was referred to gynecologic oncology and on 10/29/2011 underwent exploratory laparotomy under Drs. Cleda Mccreedy and Antionette Char. This consisted of a total abdominal hysterectomy with bilateral salpingo-oophorectomy, omentectomy, and radical tumor debulking. Unfortunately there was significant tumor attached to the diaphragm so the patient was suboptimally debulked. Accordingly a peritoneal catheter was not placed. GYN-ONC recommended 6 cycles of carboplatin/paclitaxel given every 3 weeks, with Neulasta on day 2 for granulocyte support. Rhemi's subsequent history is as detailed below.  INTERVAL HISTORY:  [name] returns today accompanied by her husband for followup of her metastatic ovarian cancer. She is currently day 8 cycle 4 of Doxil being given every 3 weeks, which she is tolerating remarkably well.   Interval history is remarkable for Crystalle getting up during the night last night, pulling the TPN line too far, and waking up this morning  with swelling, redness, and pain in the right  breast, just beneath the port site. It has become increasingly tender to touch throughout the morning.    With regard to her ovarian cancer and her chemotherapy, Mariluz is actually feeling well. She has been eating reasonably well with no significant nausea and no emesis. She does continue, however, have constipation. She had a very small bowel movement last night. The stool is hard, but she denies any bleeding. A KUB last week was unremarkable, and she is scheduled for a CT of the abdomen and pelvis tomorrow.   REVIEW OF SYSTEMS:  [name] has had no fevers or chills, but does have hot flashes. She notes no skin changes, rashes, or evidence of  PPE.  She's had no mouth ulcers or oral sensitivity. She denies any signs of abnormal bruising or bleeding. She has no significant cough, no phlegm production, no chest pain or palpitations. Her shortness of breath has improved since receiving a blood transfusion last week on May 7. Her energy level has improved as well. She denies abnormal headaches or dizziness. She currently denies any unusual pain, and has had no peripheral neuropathy.  A detailed view of systems is otherwise stable and noncontributory.   PAST MEDICAL HISTORY: Past Medical History  Diagnosis Date  . History of breast cancer   . COPD (chronic obstructive pulmonary disease)   . GERD (gastroesophageal reflux disease)   . Diabetes mellitus     type II  . Hx of colonic polyps   . Osteoarthritis   . MVP (mitral valve prolapse)   . Dysrhythmia     pt states runs a little fast   . Shortness of breath     with exertion   . Depression   . Anxiety   . Heart  murmur   . Cancer     left breast  . Ovarian cancer     active chemotherapy  . Stroke   . Ascites 10/22/2012    PAST SURGICAL HISTORY: Past Surgical History  Procedure Laterality Date  . Cesarean section  1980, 1982    X 2   . Laparoscopy      work up for infertility  . Tympanoplasty      left X 2  . Left breast  fibroadenoma removal  1960's  . Breast cyst aspirations    . Left modified radial mastectomy    . Laparotomy  10/29/2011    Procedure: EXPLORATORY LAPAROTOMY;  Surgeon: Rejeana Brock A. Duard Brady, MD;  Location: [facility];  Service: Gynecology;  Laterality: N/A;  . Abdominal hysterectomy  10/29/2011    Procedure: HYSTERECTOMY ABDOMINAL;  Surgeon: Rejeana Brock A. Duard Brady, MD;  Location: [facility];  Service: Gynecology;  Laterality: N/A;  Total abdominal hysterectomy  . Salpingoophorectomy  10/29/2011    Procedure: SALPINGO OOPHERECTOMY;  Surgeon: Rejeana Brock A. Duard Brady, MD;  Location: [facility];  Service: Gynecology;  Laterality: Bilateral;    FAMILY HISTORY Family History  Problem Relation Age of Onset  . Cancer Mother     endometrial/ colon  . Cancer Brother     colon  . COPD Other   . Hypertension Other   . Cancer Maternal Uncle     prostate/bladder/lung  . Cancer Paternal Aunt     unknown abdominal cancer  . Cancer Maternal Grandmother     Salivary gland cancer  . Cancer Paternal Aunt     leukemia   the patient's father died at the age of 96. The patient's mother died at the age of 109; she had a history of colon cancer and endometrial cancer. The patient has no sisters. She has one brother, with a history of colon cancer. There is no history of breast cancer in the family. The patient is scheduled for genetic testing later this month.  GYNECOLOGIC HISTORY: She had menarche age 36. First pregnancy to term was at age 29. She is GX P2. She became menopausal at the time of her chemotherapy for breast cancer. This was age 63. 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 Her husband [name] runs a Psychologist, sport and exercise out of their own home. Daughter [name], 22,  currently lives at home with the patient. There are some issues relating to this daughter that the patient discussed briefly. Dauhter. [name], 30, is an ED nurse at [employer]. The patient has no  grandchildren. She is not a Advice worker.   ADVANCED DIRECTIVES: in place  HEALTH MAINTENANCE: History  Substance Use Topics  . Smoking status: Current Some Day Smoker -- 0.50 packs/day for 30 years    Types: Cigarettes  . Smokeless tobacco: Never Used     Comment: quit again 2009; restarted 1 ppd 2011  . Alcohol Use: No     Colonoscopy: 2009  PAP: s/p hysterectomy  Bone density: 2010. "good"  Mammography: 2012 NOV--unremarkable  Allergies  Allergen Reactions  . Promethazine     "Makes me feel like I want to jump out of my skin"    Current Outpatient Prescriptions  Medication Sig Dispense Refill  . albuterol-ipratropium (COMBIVENT) 18-103 MCG/ACT inhaler Inhale 2 puffs into the lungs every 4 (four) hours as needed. For wheezing  14.7 Inhaler  3  . Alum & Mag Hydroxide-Simeth (MAGIC MOUTHWASH W/LIDOCAINE) SOLN Take 5 mLs by  mouth 4 (four) times daily as needed.  250 mL  1  . atenolol (TENORMIN) 25 MG tablet Take 12.5 mg by mouth 2 (two) times daily.      . budesonide-formoterol (SYMBICORT) 160-4.5 MCG/ACT inhaler Inhale 2 puffs into the lungs 2 (two) times daily.      Marland Kitchen buPROPion (WELLBUTRIN SR) 150 MG 12 hr tablet Take 1 tablet (150 mg total) by mouth 2 (two) times daily.  180 tablet  3  . busPIRone (BUSPAR) 15 MG tablet Take 1 tablet (15 mg total) by mouth 2 (two) times daily.  180 tablet  2  . clopidogrel (PLAVIX) 75 MG tablet Take 1 tablet (75 mg total) by mouth daily with breakfast.  60 tablet  3  . COMBIVENT RESPIMAT 20-100 MCG/ACT AERS respimat INHALE 2 PUFFS BY MOUTH EVERY 4 HOURS ASNEEDED FOR WHEEZING  4 g  5  . diazepam (VALIUM) 5 MG tablet TAKE 1 TO 2 TABLETS BY MOUTH EVERY NIGHTAT BEDRIME AS NEEDED FOR INSOMNIA  60 tablet  2  . lovastatin (MEVACOR) 40 MG tablet Take 1 tablet (40 mg total) by mouth daily.  90 tablet  2  . morphine 20 MG/5ML solution Take 2.5 mLs (10 mg total) by mouth every 3 (three) hours as needed for pain.  100 mL  0  . omeprazole (PRILOSEC) 40 MG  capsule Take 1 capsule (40 mg total) by mouth daily.  90 capsule  3  . ondansetron (ZOFRAN) 8 MG tablet Take 1 tablet (8 mg total) by mouth 2 (two) times daily. Take two times a day starting the day after chemo for 2 days. Then take two times a day as needed for nausea or vomiting.  30 tablet  1  . ONE TOUCH ULTRA TEST test strip USE AS DIRECTED  100 each  0  . oxyCODONE-acetaminophen (PERCOCET/ROXICET) 5-325 MG per tablet Take 1 tablet by mouth every 6 (six) hours as needed for pain.  100 tablet  0  . prochlorperazine (COMPAZINE) 10 MG tablet Take 1 tablet (10 mg total) by mouth every 6 (six) hours as needed (Nausea or vomiting).  30 tablet  1  . sertraline (ZOLOFT) 100 MG tablet Take 1 tablet (100 mg total) by mouth daily before breakfast.  90 tablet  3  . [DISCONTINUED] busPIRone (BUSPAR) 15 MG tablet Take 1 tablet (15 mg total) by mouth 2 (two) times daily.  180 tablet  1  . [DISCONTINUED] roflumilast (DALIRESP) 500 MCG TABS tablet Take 500 mcg by mouth daily.         No current facility-administered medications for this visit.   OBJECTIVE: Older white woman who appears anxious and is examined in a chair today  Filed Vitals:   12/15/12 1116  BP: 108/67  Pulse: 96  Temp: 98.2 F (36.8 C)  Resp: 20     Body mass index is 21.46 kg/(m^2).    ECOG FS: 2 Filed Weights   12/15/12 1116  Weight: 125 lb 1.6 oz (56.745 kg)   Sclerae unicteric Oropharynx clear,  Lungs diminished breath sounds bilaterally in the bases, No wheezes or rhonchi Heart regular rate and rhythm Abdomen soft, nontender, nondistended, positive bowel sounds, Aspira drain is in the lower right abdomen. MSK  no peripheral edema Neuro: nonfocal, well oriented, anxious affect Breasts: Port is intact in the right upper chest wall, with right breast is enlarged, erythematous, blanching, when to touch, and very tender to gentle palpation. There is no evidence of draining or bleeding from the port  site, and no swelling around the  port site itself. Fullness is also palpated along the lateral portion of the breast extending to the axilla.  Patient status post left mastectomy.   LAB RESULTS: Lab Results  Component Value Date   WBC 11.0* 12/15/2012   NEUTROABS 8.5* 12/15/2012   HGB 10.5* 12/15/2012   HCT 32.2* 12/15/2012   MCV 89.1 12/15/2012   PLT 679* 12/15/2012      Chemistry      Component Value Date/Time   NA 138 12/15/2012 1106   NA 132* 11/13/2012 0540   K 4.4 12/15/2012 1106   K 4.0 11/13/2012 0540   CL 101 12/15/2012 1106   CL 100 11/13/2012 0540   CO2 25 12/15/2012 1106   CO2 25 11/13/2012 0540   BUN 23.2 12/15/2012 1106   BUN 13 11/13/2012 0540   CREATININE 0.6 12/15/2012 1106   CREATININE 0.49* 11/13/2012 0540      Component Value Date/Time   CALCIUM 9.4 12/15/2012 1106   CALCIUM 7.7* 11/13/2012 0540   ALKPHOS 125 12/15/2012 1106   ALKPHOS 79 11/12/2012 0426   AST 26 12/15/2012 1106   AST 10 11/12/2012 0426   ALT 27 12/15/2012 1106   ALT 7 11/12/2012 0426   BILITOT <0.20 Repeated and Verified 12/15/2012 1106   BILITOT 0.1* 11/12/2012 0426        STUDIES:  Dg Abd 1 View  12/09/2012  *RADIOLOGY REPORT*  Clinical Data: Abdominal pain.  Ovarian cancer  ABDOMEN - 1 VIEW  Comparison: 11/07/2012  Findings: Normal bowel gas pattern.  Negative for bowel obstruction.  Peritoneal catheter overlying the upper pelvis is unchanged.  Degenerative changes at L2-3 with lateral subluxation is unchanged. No acute bony lesion.  IMPRESSION: No acute abnormality.   Original Report Authenticated By: Janeece Riggers, M.D.      ASSESSMENT: 72 y.o.  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 [V323G (1196T>G)]    (1) status post left modified radical mastectomy October 1993 for a T2 N0 invasive ductal breast, estrogen and progesterone receptor positive cancer treated adjuvantly with MFL (methotrexate/fluorouracil/leucovorin) chemotherapy x6, completed April 1994, followed by 5 years of tamoxifen  completed April 1999, with no evidence of recurrence  (2) now status post TAH BSO, omentectomy, and suboptimal debulking 10/29/2011 for a high-grade serous adenocarcinoma of the ovary, pT3c NX (Stage IIIC) , s/p eight cycles of carboplatin/paclitaxel completed 05/05/2012 (initial CA125 of 2050.7 normalized after cycle 5)  (3) recurrent disease documented by rising CEA 125 an abdominal CT scan 09/21/2012.  (4) COPD with ongoing tobacco abuse  (5) diabetes mellitus  (6) genetic testing for Lynch syndrome pending.  (7)  status post hospitalization in July 2013 for stroke/small acute infarcts in the left sub insular white matter and coronal radiata.  (8) port in place  (9) left upper lobe 6 mm subpleural nodule, without associated hypermetabolic activity (likely well below the resolution of PET imaging) noted August 2013  (10) CTs obtained in [place] 2014 showed evidence of significant progression of disease with widespread intraperitoneal metastases and a moderate volume of presumably malignant ascites.  (11) currently being treated with Doxil on a Q. three-week basis, first dose given on 10/06/2012  (12) cholecystitis, with cholecystectomy pending  (13)  recurring ascites, requiring therapeutic paracentesis, and pending placement of Aspira drain  (14) Apparent infiltration of TPN   PLAN:  This case was reviewed with Dr. Darnelle Catalan who also spoke with the patient today.  With regards  to her ovarian cancer, Nikola continues to tolerate the Doxil well, and is scheduled for a restaging CT tomorrow. She is scheduled to see Dr. Darnelle Catalan next week to review those results and review her treatment plan.  Her biggest concern today, however, is the apparent infiltrate of TPN. She has been referred to the emergency room for a consult with plastic surgery for possible I&D.   Mckinsley Koelzer    12/15/2012

## 2012-12-15 NOTE — ED Provider Notes (Signed)
History    CSN: [id number] rrival date & time 12/15/12  1256 First MD Initiated Contact with Patient 12/15/12 1507      Chief Complaint  Patient presents with  . Breast Problem    HPI The patient has a history of breast ca and ovarian ca.  She is receiving chemotherapy for the latter.  Pt receives tpn at night and her portacath needle was accidentally pulled out last night during infusion.  The concern is that she had TPN infilatrate into her breast.  Pt was seen by the cancer center doctors today.  They were concerned about the tenderness and redness that has developed in her right breast following that.  She was sent up to the ED to see a plastic surgeon.  They did not make any arrangements and requested that the ED proceed with consultation. Pt denies fever.  No chest pain.  No shortness of breath. Past Medical History  Diagnosis Date  . History of breast cancer   . COPD (chronic obstructive pulmonary disease)   . GERD (gastroesophageal reflux disease)   . Diabetes mellitus     type II  . Hx of colonic polyps   . Osteoarthritis   . MVP (mitral valve prolapse)   . Dysrhythmia     pt states runs a little fast   . Shortness of breath     with exertion   . Depression   . Anxiety   . Heart murmur   . Cancer     left breast  . Ovarian cancer     active chemotherapy  . Stroke   . Ascites 10/22/2012    Past Surgical History  Procedure Laterality Date  . Cesarean section  1980, 1982    X 2   . Laparoscopy      work up for infertility  . Tympanoplasty      left X 2  . Left breast fibroadenoma removal  1960's  . Breast cyst aspirations    . Left modified radial mastectomy      >20 years ago  . Laparotomy  10/29/2011    Procedure: EXPLORATORY LAPAROTOMY;  Surgeon: Rejeana Brock A. Duard Brady, MD;  Location: [facility];  Service: Gynecology;  Laterality: N/A;  . Abdominal hysterectomy  10/29/2011    Procedure: HYSTERECTOMY ABDOMINAL;  Surgeon: Rejeana Brock A. Duard Brady, MD;  Location: [facility];  Service:  Gynecology;  Laterality: N/A;  Total abdominal hysterectomy  . Salpingoophorectomy  10/29/2011    Procedure: SALPINGO OOPHERECTOMY;  Surgeon: Rejeana Brock A. Duard Brady, MD;  Location: [facility];  Service: Gynecology;  Laterality: Bilateral;    Family History  Problem Relation Age of Onset  . Cancer Mother     endometrial/ colon  . Cancer Brother     colon  . COPD Other   . Hypertension Other   . Cancer Maternal Uncle     prostate/bladder/lung  . Cancer Paternal Aunt     unknown abdominal cancer  . Cancer Maternal Grandmother     Salivary gland cancer  . Cancer Paternal Aunt     leukemia    History  Substance Use Topics  . Smoking status: Current Some Day Smoker -- 0.50 packs/day for 30 years    Types: Cigarettes  . Smokeless tobacco: Never Used     Comment: quit again 2009; restarted 1 ppd 2011  . Alcohol Use: No    OB History   Grav Para Term Preterm Abortions TAB SAB Ect Mult Living  Review of Systems  All other systems reviewed and are negative.    Allergies  Promethazine  Home Medications   Current Outpatient Rx  Name  Route  Sig  Dispense  Refill  . albuterol (PROVENTIL) (2.5 MG/3ML) 0.083% nebulizer solution   Nebulization   Take 2.5 mg by nebulization every 6 (six) hours as needed for wheezing.         Marland Kitchen albuterol-ipratropium (COMBIVENT) 18-103 MCG/ACT inhaler   Inhalation   Inhale 2 puffs into the lungs every 4 (four) hours as needed. For wheezing   14.7 Inhaler   3   . Alum & Mag Hydroxide-Simeth (MAGIC MOUTHWASH W/LIDOCAINE) SOLN   Oral   Take 5 mLs by mouth 4 (four) times daily as needed.   250 mL   1   . atenolol (TENORMIN) 25 MG tablet   Oral   Take 12.5 mg by mouth 2 (two) times daily.         . budesonide-formoterol (SYMBICORT) 160-4.5 MCG/ACT inhaler   Inhalation   Inhale 2 puffs into the lungs 2 (two) times daily.         Marland Kitchen buPROPion (WELLBUTRIN SR) 150 MG 12 hr tablet   Oral   Take 1 tablet (150 mg total) by mouth 2  (two) times daily.   180 tablet   3   . busPIRone (BUSPAR) 15 MG tablet   Oral   Take 1 tablet (15 mg total) by mouth 2 (two) times daily.   180 tablet   2   . clopidogrel (PLAVIX) 75 MG tablet   Oral   Take 1 tablet (75 mg total) by mouth daily with breakfast.   60 tablet   3   . COMBIVENT RESPIMAT 20-100 MCG/ACT AERS respimat      INHALE 2 PUFFS BY MOUTH EVERY 4 HOURS ASNEEDED FOR WHEEZING   4 g   5   . diazepam (VALIUM) 5 MG tablet      TAKE 1 TO 2 TABLETS BY MOUTH EVERY NIGHTAT BEDRIME AS NEEDED FOR INSOMNIA   60 tablet   2   . lovastatin (MEVACOR) 40 MG tablet   Oral   Take 1 tablet (40 mg total) by mouth daily.   90 tablet   2   . omeprazole (PRILOSEC) 40 MG capsule   Oral   Take 1 capsule (40 mg total) by mouth daily.   90 capsule   3   . ondansetron (ZOFRAN) 8 MG tablet   Oral   Take 1 tablet (8 mg total) by mouth 2 (two) times daily. Take two times a day starting the day after chemo for 2 days. Then take two times a day as needed for nausea or vomiting.   30 tablet   1   . ONE TOUCH ULTRA TEST test strip      USE AS DIRECTED   100 each   0     Dx: 250.00   . oxyCODONE-acetaminophen (PERCOCET/ROXICET) 5-325 MG per tablet   Oral   Take 1 tablet by mouth every 6 (six) hours as needed for pain.   100 tablet   0   . PRESCRIPTION MEDICATION      Home Parenteral Nutrition-advanced home care         . prochlorperazine (COMPAZINE) 10 MG tablet   Oral   Take 1 tablet (10 mg total) by mouth every 6 (six) hours as needed (Nausea or vomiting).   30 tablet   1   . sertraline (ZOLOFT)  100 MG tablet   Oral   Take 1 tablet (100 mg total) by mouth daily before breakfast.   90 tablet   3     BP 134/70  Pulse 97  Temp(Src) 98.1 F (36.7 C) (Oral)  Resp 20  SpO2 95%  Physical Exam  Nursing note and vitals reviewed. Constitutional: She appears well-developed and well-nourished. No distress.  Underweight   HENT:  Head: Normocephalic and  atraumatic.  Right Ear: External ear normal.  Left Ear: External ear normal.  Eyes: Conjunctivae are normal. Right eye exhibits no discharge. Left eye exhibits no discharge. No scleral icterus.  Neck: Neck supple. No tracheal deviation present.  Cardiovascular: Normal rate, regular rhythm and intact distal pulses.   Pulmonary/Chest: Effort normal and breath sounds normal. No stridor. No respiratory distress. She has no wheezes. She has no rales.    Tenderness right breast, mild erythema, no induration, no palpable fluid collection, no firmness or discrete fluctuance  Abdominal: Soft. Bowel sounds are normal. She exhibits no distension. There is no tenderness. There is no rebound and no guarding.  Musculoskeletal: She exhibits no edema and no tenderness.  Neurological: She is alert. She has normal strength. No sensory deficit. Cranial nerve deficit: no gross deficits. She exhibits normal muscle tone. She displays no seizure activity. Coordination normal.  Skin: Skin is warm and dry. No rash noted.  Psychiatric: She has a normal mood and affect.    ED Course  Procedures (including critical care time)  Labs Reviewed  GLUCOSE, CAPILLARY  CBC WITH DIFFERENTIAL  BASIC METABOLIC PANEL   Dg Chest 2 View  12/15/2012  *RADIOLOGY REPORT*  Clinical Data: COPD.  History of left mastectomy.  History of ovarian carcinoma.  CHEST - 2 VIEW  Comparison: PA and lateral chest 09/24/2012.  Findings: Since the prior study, the patient has developed small bilateral pleural effusions, larger on the left.  Lungs are clear. Heart size is normal.  The patient is status post left mastectomy and axillary dissection.  Port-A-Cath again noted.  IMPRESSION: New small bilateral pleural effusions, larger on the left.  No other change.   Original Report Authenticated By: Holley Dexter, M.D.      1. Breast inflammation       MDM  Pt was seen by general surgery in the ED.  No I&D necessary.  Pt admits the swelling  has already gone down significantly.  Monitor for infection.  Pain meds as needed.  No abx at this time as likely a chemical irritation       Celene Kras, MD 12/15/12 [id number]

## 2012-12-16 ENCOUNTER — Other Ambulatory Visit (HOSPITAL_COMMUNITY): Payer: Medicare Other

## 2012-12-16 ENCOUNTER — Ambulatory Visit (HOSPITAL_COMMUNITY): Payer: Medicare Other

## 2012-12-17 ENCOUNTER — Telehealth: Payer: Self-pay | Admitting: *Deleted

## 2012-12-17 NOTE — Telephone Encounter (Signed)
This RN called pt for update per concern for infiltrate of TPN on 5/13.  [name] [name] states " much better " Per discussion pt states breast size has returned to normal and swelling under arm has diminished. Breast and port site are not tender to touch and pt denies any redness or warmth. She also states skin is intact.  Port was reaccessed in ER " and oh the alcohol scrub just burned like a pain I have never felt ". Pt held the TPN Tuesday night and resumed it last night,  Presently husband and her are going to the beach and understands to call this office if needed for concerns.

## 2012-12-17 NOTE — Progress Notes (Signed)
Erroneous encounter

## 2012-12-18 ENCOUNTER — Other Ambulatory Visit (HOSPITAL_COMMUNITY): Payer: Medicare Other

## 2012-12-18 ENCOUNTER — Ambulatory Visit (HOSPITAL_COMMUNITY): Payer: Medicare Other

## 2012-12-21 ENCOUNTER — Ambulatory Visit (HOSPITAL_COMMUNITY): Payer: Medicare Other

## 2012-12-21 ENCOUNTER — Other Ambulatory Visit: Payer: Self-pay | Admitting: Physician Assistant

## 2012-12-21 DIAGNOSIS — C569 Malignant neoplasm of unspecified ovary: Secondary | ICD-10-CM

## 2012-12-22 ENCOUNTER — Ambulatory Visit: Payer: Medicare Other | Admitting: Oncology

## 2012-12-22 ENCOUNTER — Other Ambulatory Visit: Payer: Medicare Other | Admitting: Lab

## 2012-12-23 ENCOUNTER — Other Ambulatory Visit (HOSPITAL_COMMUNITY): Payer: Medicare Other

## 2012-12-23 ENCOUNTER — Other Ambulatory Visit: Payer: Self-pay | Admitting: *Deleted

## 2012-12-23 MED ORDER — RANITIDINE HCL 150 MG PO TABS: 150.0000 mg | ORAL_TABLET | Freq: Two times a day (BID) | ORAL | Status: DC

## 2012-12-25 ENCOUNTER — Telehealth: Payer: Self-pay | Admitting: *Deleted

## 2012-12-25 ENCOUNTER — Other Ambulatory Visit: Payer: Self-pay | Admitting: *Deleted

## 2012-12-25 NOTE — Telephone Encounter (Signed)
sw pt daughter [name] and gv appt for CT on 01/06/13 informing her that the pt need to be there @11 :15am. Also gve her appt d/t for lab and ov on 01/07/13 @10 :45am....td

## 2012-12-29 ENCOUNTER — Other Ambulatory Visit: Payer: Self-pay | Admitting: Oncology

## 2012-12-29 ENCOUNTER — Other Ambulatory Visit (HOSPITAL_BASED_OUTPATIENT_CLINIC_OR_DEPARTMENT_OTHER): Payer: Medicare Other | Admitting: Lab

## 2012-12-29 ENCOUNTER — Ambulatory Visit (HOSPITAL_BASED_OUTPATIENT_CLINIC_OR_DEPARTMENT_OTHER): Payer: Medicare Other

## 2012-12-29 VITALS — BP 110/65 | HR 103 | Temp 97.0°F | Resp 18

## 2012-12-29 DIAGNOSIS — Z5111 Encounter for antineoplastic chemotherapy: Secondary | ICD-10-CM

## 2012-12-29 DIAGNOSIS — C569 Malignant neoplasm of unspecified ovary: Secondary | ICD-10-CM

## 2012-12-29 DIAGNOSIS — K5900 Constipation, unspecified: Secondary | ICD-10-CM

## 2012-12-29 LAB — CBC WITH DIFFERENTIAL/PLATELET
BASO%: 0.6 % (ref 0.0–2.0)
EOS%: 1.5 % (ref 0.0–7.0)
HCT: 31.4 % — ABNORMAL LOW (ref 34.8–46.6)
MCH: 28.5 pg (ref 25.1–34.0)
MCHC: 31.8 g/dL (ref 31.5–36.0)
NEUT%: 58.1 % (ref 38.4–76.8)
RBC: 3.51 10*6/uL — ABNORMAL LOW (ref 3.70–5.45)
RDW: 17.1 % — ABNORMAL HIGH (ref 11.2–14.5)
WBC: 8.5 10*3/uL (ref 3.9–10.3)
lymph#: 1.9 10*3/uL (ref 0.9–3.3)
nRBC: 0 % (ref 0–0)

## 2012-12-29 LAB — COMPREHENSIVE METABOLIC PANEL (CC13)
ALT: 20 U/L (ref 0–55)
AST: 18 U/L (ref 5–34)
Albumin: 2.3 g/dL — ABNORMAL LOW (ref 3.5–5.0)
Alkaline Phosphatase: 117 U/L (ref 40–150)
BUN: 27.5 mg/dL — ABNORMAL HIGH (ref 7.0–26.0)
Potassium: 4.3 mEq/L (ref 3.5–5.1)

## 2012-12-29 MED ORDER — ONDANSETRON 8 MG/50ML IVPB (CHCC)
8.0000 mg | Freq: Once | INTRAVENOUS | Status: AC
  Administered 2012-12-29: 8 mg via INTRAVENOUS

## 2012-12-29 MED ORDER — DOXORUBICIN HCL LIPOSOMAL CHEMO INJECTION 2 MG/ML
40.0000 mg/m2 | Freq: Once | INTRAVENOUS | Status: AC
  Administered 2012-12-29: 66 mg via INTRAVENOUS
  Filled 2012-12-29: qty 33

## 2012-12-29 MED ORDER — HEPARIN SOD (PORK) LOCK FLUSH 100 UNIT/ML IV SOLN
500.0000 [IU] | Freq: Once | INTRAVENOUS | Status: AC | PRN
  Administered 2012-12-29: 500 [IU]
  Filled 2012-12-29: qty 5

## 2012-12-29 MED ORDER — DEXAMETHASONE SODIUM PHOSPHATE 10 MG/ML IJ SOLN
10.0000 mg | Freq: Once | INTRAMUSCULAR | Status: AC
  Administered 2012-12-29: 10 mg via INTRAVENOUS

## 2012-12-29 MED ORDER — SODIUM CHLORIDE 0.9 % IJ SOLN
10.0000 mL | INTRAMUSCULAR | Status: DC | PRN
  Administered 2012-12-29: 10 mL
  Filled 2012-12-29: qty 10

## 2012-12-29 MED ORDER — SODIUM CHLORIDE 0.9 % IV SOLN
Freq: Once | INTRAVENOUS | Status: AC
  Administered 2012-12-29: 11:00:00 via INTRAVENOUS

## 2012-12-29 NOTE — Progress Notes (Signed)
Patient complains of constipation x 1 week. Patient states this happens often, but is relieved when she takes Dulcolax laxatives. Patient states she is going to take Dulcolax laxative today when she gets home. Patient verbalized understanding that she is to call our office if no relief after taking laxative.

## 2012-12-29 NOTE — Patient Instructions (Signed)
[facility] Discharge Instructions for Patients Receiving Chemotherapy  Today you received the following chemotherapy agents Doxil To help prevent nausea and vomiting after your treatment, we encourage you to take your nausea medication as prescribed.   If you develop nausea and vomiting that is not controlled by your nausea medication, call the clinic. If it is after clinic hours your family physician or the after hours number for the clinic or go to the Emergency Department.   BELOW ARE SYMPTOMS THAT SHOULD BE REPORTED IMMEDIATELY:  *FEVER GREATER THAN 100.5 F  *CHILLS WITH OR WITHOUT FEVER  NAUSEA AND VOMITING THAT IS NOT CONTROLLED WITH YOUR NAUSEA MEDICATION  *UNUSUAL SHORTNESS OF BREATH  *UNUSUAL BRUISING OR BLEEDING  TENDERNESS IN MOUTH AND THROAT WITH OR WITHOUT PRESENCE OF ULCERS  *URINARY PROBLEMS  *BOWEL PROBLEMS  UNUSUAL RASH Items with * indicate a potential emergency and should be followed up as soon as possible.  Feel free to call the clinic you have any questions or concerns. The clinic phone number is [phone].   I have been informed and understand all the instructions given to me. I know to contact the clinic, my physician, or go to the Emergency Department if any problems should occur. I do not have any questions at this time, but understand that I may call the clinic during office hours   should I have any questions or need assistance in obtaining follow up care.    __________________________________________  _____________  __________ Signature of Patient or Authorized Representative            Date                   Time    __________________________________________ Nurse's Signature

## 2012-12-30 ENCOUNTER — Telehealth: Payer: Self-pay | Admitting: Internal Medicine

## 2012-12-30 DIAGNOSIS — R188 Other ascites: Secondary | ICD-10-CM

## 2012-12-30 DIAGNOSIS — K56600 Partial intestinal obstruction, unspecified as to cause: Secondary | ICD-10-CM

## 2012-12-30 DIAGNOSIS — C569 Malignant neoplasm of unspecified ovary: Secondary | ICD-10-CM

## 2012-12-30 DIAGNOSIS — R140 Abdominal distension (gaseous): Secondary | ICD-10-CM

## 2012-12-30 NOTE — Telephone Encounter (Signed)
The patient's daughter called hoping to get a refill for percocet as soon as possible - her callback number - [phone]

## 2012-12-31 MED ORDER — OXYCODONE-ACETAMINOPHEN 5-325 MG PO TABS: 1.0000 | ORAL_TABLET | Freq: Four times a day (QID) | ORAL | Status: DC | PRN

## 2012-12-31 NOTE — Telephone Encounter (Signed)
Ok Thx 

## 2012-12-31 NOTE — Telephone Encounter (Signed)
Rx upfront. Pt's daughter informed.

## 2012-12-31 NOTE — Telephone Encounter (Signed)
Pt's daughter called to follow up on the med req.

## 2013-01-01 ENCOUNTER — Encounter: Payer: Self-pay | Admitting: Oncology

## 2013-01-03 DIAGNOSIS — K56600 Partial intestinal obstruction, unspecified as to cause: Secondary | ICD-10-CM

## 2013-01-03 HISTORY — DX: Partial intestinal obstruction, unspecified as to cause: K56.600

## 2013-01-06 ENCOUNTER — Ambulatory Visit (HOSPITAL_COMMUNITY)
Admission: RE | Admit: 2013-01-06 | Discharge: 2013-01-06 | Disposition: A | Discharge status: Home / Self Care | Payer: Medicare Other | Source: Ambulatory Visit | Attending: Physician Assistant | Admitting: Physician Assistant

## 2013-01-06 ENCOUNTER — Telehealth: Payer: Self-pay | Admitting: *Deleted

## 2013-01-06 ENCOUNTER — Inpatient Hospital Stay (HOSPITAL_COMMUNITY)
Admission: AD | Admit: 2013-01-06 | Discharge: 2013-01-08 | DRG: 388 | Disposition: A | Discharge status: Home / Self Care | Payer: Medicare Other | Source: Ambulatory Visit | Attending: Oncology | Admitting: Oncology

## 2013-01-06 ENCOUNTER — Ambulatory Visit: Payer: Medicare Other | Admitting: Oncology

## 2013-01-06 ENCOUNTER — Other Ambulatory Visit: Payer: Self-pay | Admitting: *Deleted

## 2013-01-06 ENCOUNTER — Ambulatory Visit (HOSPITAL_COMMUNITY): Payer: Medicare Other

## 2013-01-06 ENCOUNTER — Encounter (HOSPITAL_COMMUNITY): Payer: Self-pay | Admitting: *Deleted

## 2013-01-06 DIAGNOSIS — C569 Malignant neoplasm of unspecified ovary: Secondary | ICD-10-CM | POA: Diagnosis present

## 2013-01-06 DIAGNOSIS — K56609 Unspecified intestinal obstruction, unspecified as to partial versus complete obstruction: Principal | ICD-10-CM | POA: Diagnosis present

## 2013-01-06 DIAGNOSIS — K819 Cholecystitis, unspecified: Secondary | ICD-10-CM | POA: Diagnosis present

## 2013-01-06 DIAGNOSIS — K219 Gastro-esophageal reflux disease without esophagitis: Secondary | ICD-10-CM | POA: Diagnosis present

## 2013-01-06 DIAGNOSIS — R011 Cardiac murmur, unspecified: Secondary | ICD-10-CM | POA: Diagnosis present

## 2013-01-06 DIAGNOSIS — J310 Chronic rhinitis: Secondary | ICD-10-CM

## 2013-01-06 DIAGNOSIS — IMO0002 Reserved for concepts with insufficient information to code with codable children: Secondary | ICD-10-CM

## 2013-01-06 DIAGNOSIS — F17200 Nicotine dependence, unspecified, uncomplicated: Secondary | ICD-10-CM | POA: Diagnosis present

## 2013-01-06 DIAGNOSIS — F3289 Other specified depressive episodes: Secondary | ICD-10-CM | POA: Diagnosis present

## 2013-01-06 DIAGNOSIS — E119 Type 2 diabetes mellitus without complications: Secondary | ICD-10-CM

## 2013-01-06 DIAGNOSIS — C786 Secondary malignant neoplasm of retroperitoneum and peritoneum: Secondary | ICD-10-CM | POA: Diagnosis present

## 2013-01-06 DIAGNOSIS — I059 Rheumatic mitral valve disease, unspecified: Secondary | ICD-10-CM | POA: Diagnosis present

## 2013-01-06 DIAGNOSIS — Z79899 Other long term (current) drug therapy: Secondary | ICD-10-CM

## 2013-01-06 DIAGNOSIS — M1990 Unspecified osteoarthritis, unspecified site: Secondary | ICD-10-CM | POA: Diagnosis present

## 2013-01-06 DIAGNOSIS — F329 Major depressive disorder, single episode, unspecified: Secondary | ICD-10-CM | POA: Diagnosis present

## 2013-01-06 DIAGNOSIS — E860 Dehydration: Secondary | ICD-10-CM

## 2013-01-06 DIAGNOSIS — Z8673 Personal history of transient ischemic attack (TIA), and cerebral infarction without residual deficits: Secondary | ICD-10-CM

## 2013-01-06 DIAGNOSIS — R911 Solitary pulmonary nodule: Secondary | ICD-10-CM | POA: Diagnosis present

## 2013-01-06 DIAGNOSIS — K5900 Constipation, unspecified: Secondary | ICD-10-CM | POA: Insufficient documentation

## 2013-01-06 DIAGNOSIS — J4489 Other specified chronic obstructive pulmonary disease: Secondary | ICD-10-CM | POA: Diagnosis present

## 2013-01-06 DIAGNOSIS — R188 Other ascites: Secondary | ICD-10-CM | POA: Diagnosis present

## 2013-01-06 DIAGNOSIS — E43 Unspecified severe protein-calorie malnutrition: Secondary | ICD-10-CM | POA: Diagnosis present

## 2013-01-06 DIAGNOSIS — R110 Nausea: Secondary | ICD-10-CM

## 2013-01-06 DIAGNOSIS — J449 Chronic obstructive pulmonary disease, unspecified: Secondary | ICD-10-CM | POA: Diagnosis present

## 2013-01-06 DIAGNOSIS — E785 Hyperlipidemia, unspecified: Secondary | ICD-10-CM

## 2013-01-06 DIAGNOSIS — J209 Acute bronchitis, unspecified: Secondary | ICD-10-CM

## 2013-01-06 DIAGNOSIS — Z853 Personal history of malignant neoplasm of breast: Secondary | ICD-10-CM

## 2013-01-06 DIAGNOSIS — F411 Generalized anxiety disorder: Secondary | ICD-10-CM | POA: Diagnosis present

## 2013-01-06 DIAGNOSIS — R52 Pain, unspecified: Secondary | ICD-10-CM

## 2013-01-06 LAB — CBC WITH DIFFERENTIAL/PLATELET
Eosinophils Relative: 2 % (ref 0–5)
Hemoglobin: 9.4 g/dL — ABNORMAL LOW (ref 12.0–15.0)
Lymphocytes Relative: 20 % (ref 12–46)
Lymphs Abs: 1.7 10*3/uL (ref 0.7–4.0)
MCV: 87.2 fL (ref 78.0–100.0)
Platelets: 547 10*3/uL — ABNORMAL HIGH (ref 150–400)
RBC: 3.27 MIL/uL — ABNORMAL LOW (ref 3.87–5.11)
WBC: 8.7 10*3/uL (ref 4.0–10.5)

## 2013-01-06 LAB — URINALYSIS, ROUTINE W REFLEX MICROSCOPIC
Bilirubin Urine: NEGATIVE
Hgb urine dipstick: NEGATIVE
Nitrite: NEGATIVE
Protein, ur: NEGATIVE mg/dL
Urobilinogen, UA: 1 mg/dL (ref 0.0–1.0)

## 2013-01-06 LAB — COMPREHENSIVE METABOLIC PANEL
Albumin: 2.4 g/dL — ABNORMAL LOW (ref 3.5–5.2)
BUN: 21 mg/dL (ref 6–23)
Calcium: 9 mg/dL (ref 8.4–10.5)
Creatinine, Ser: 0.45 mg/dL — ABNORMAL LOW (ref 0.50–1.10)
Total Bilirubin: 0.1 mg/dL — ABNORMAL LOW (ref 0.3–1.2)
Total Protein: 6.3 g/dL (ref 6.0–8.3)

## 2013-01-06 MED ORDER — PROCHLORPERAZINE EDISYLATE 5 MG/ML IJ SOLN: 10.0000 mg | Freq: Four times a day (QID) | INTRAMUSCULAR | Status: DC | PRN

## 2013-01-06 MED ORDER — BUSPIRONE HCL 15 MG PO TABS
15.0000 mg | ORAL_TABLET | Freq: Two times a day (BID) | ORAL | Status: DC
Start: 2013-01-06 — End: 2013-01-08
  Administered 2013-01-06 – 2013-01-08 (×4): 15 mg via ORAL
  Filled 2013-01-06 (×5): qty 1

## 2013-01-06 MED ORDER — TRAVASOL 10 % IV SOLN
INTRAVENOUS | Status: AC
  Administered 2013-01-06: 23:00:00 via INTRAVENOUS
  Filled 2013-01-06: qty 840

## 2013-01-06 MED ORDER — PANTOPRAZOLE SODIUM 40 MG PO TBEC
40.0000 mg | DELAYED_RELEASE_TABLET | Freq: Every day | ORAL | Status: DC
  Administered 2013-01-07 – 2013-01-08 (×2): 40 mg via ORAL
  Filled 2013-01-06 (×2): qty 1

## 2013-01-06 MED ORDER — OXYCODONE-ACETAMINOPHEN 5-325 MG PO TABS
1.0000 | ORAL_TABLET | Freq: Three times a day (TID) | ORAL | Status: DC | PRN
  Administered 2013-01-06 – 2013-01-08 (×3): 1 via ORAL
  Filled 2013-01-06 (×3): qty 1

## 2013-01-06 MED ORDER — ENOXAPARIN SODIUM 40 MG/0.4ML ~~LOC~~ SOLN
40.0000 mg | SUBCUTANEOUS | Status: DC
  Administered 2013-01-07: 40 mg via SUBCUTANEOUS
  Filled 2013-01-06 (×3): qty 0.4

## 2013-01-06 MED ORDER — POTASSIUM CHLORIDE IN NACL 20-0.9 MEQ/L-% IV SOLN
INTRAVENOUS | Status: DC
  Administered 2013-01-06: 16:00:00 via INTRAVENOUS
  Filled 2013-01-06 (×2): qty 1000

## 2013-01-06 MED ORDER — CLOPIDOGREL BISULFATE 75 MG PO TABS
75.0000 mg | ORAL_TABLET | Freq: Every day | ORAL | Status: DC
  Administered 2013-01-07 – 2013-01-08 (×2): 75 mg via ORAL
  Filled 2013-01-06 (×3): qty 1

## 2013-01-06 MED ORDER — MILK AND MOLASSES ENEMA
Freq: Once | RECTAL | Status: AC
  Administered 2013-01-06: 18:00:00 via RECTAL
  Filled 2013-01-06: qty 250

## 2013-01-06 MED ORDER — SERTRALINE HCL 100 MG PO TABS
100.0000 mg | ORAL_TABLET | Freq: Every day | ORAL | Status: DC
  Administered 2013-01-07 – 2013-01-08 (×2): 100 mg via ORAL
  Filled 2013-01-06 (×3): qty 1

## 2013-01-06 MED ORDER — BUDESONIDE-FORMOTEROL FUMARATE 160-4.5 MCG/ACT IN AERO
2.0000 | INHALATION_SPRAY | Freq: Two times a day (BID) | RESPIRATORY_TRACT | Status: DC
  Administered 2013-01-06 – 2013-01-08 (×4): 2 via RESPIRATORY_TRACT
  Filled 2013-01-06: qty 6

## 2013-01-06 MED ORDER — ATENOLOL 12.5 MG HALF TABLET
12.5000 mg | ORAL_TABLET | Freq: Two times a day (BID) | ORAL | Status: DC
  Administered 2013-01-06 – 2013-01-08 (×4): 12.5 mg via ORAL
  Filled 2013-01-06 (×5): qty 1

## 2013-01-06 MED ORDER — MAGIC MOUTHWASH W/LIDOCAINE
5.0000 mL | Freq: Four times a day (QID) | ORAL | Status: DC | PRN
  Filled 2013-01-06: qty 5

## 2013-01-06 MED ORDER — IPRATROPIUM-ALBUTEROL 20-100 MCG/ACT IN AERS
1.0000 | INHALATION_SPRAY | Freq: Four times a day (QID) | RESPIRATORY_TRACT | Status: DC | PRN
  Administered 2013-01-06: 1 via RESPIRATORY_TRACT
  Filled 2013-01-06: qty 4

## 2013-01-06 MED ORDER — ONDANSETRON HCL 4 MG/2ML IJ SOLN
4.0000 mg | Freq: Three times a day (TID) | INTRAMUSCULAR | Status: DC | PRN
  Administered 2013-01-08: 4 mg via INTRAVENOUS
  Filled 2013-01-06: qty 2

## 2013-01-06 MED ORDER — PANTOPRAZOLE SODIUM 40 MG IV SOLR: 40.0000 mg | INTRAVENOUS | Status: DC

## 2013-01-06 MED ORDER — ALBUTEROL SULFATE (5 MG/ML) 0.5% IN NEBU
2.5000 mg | INHALATION_SOLUTION | Freq: Four times a day (QID) | RESPIRATORY_TRACT | Status: DC | PRN
  Administered 2013-01-06 – 2013-01-07 (×2): 2.5 mg via RESPIRATORY_TRACT
  Filled 2013-01-06 (×2): qty 0.5

## 2013-01-06 MED ORDER — BUPROPION HCL ER (SR) 150 MG PO TB12
150.0000 mg | ORAL_TABLET | Freq: Two times a day (BID) | ORAL | Status: DC
  Administered 2013-01-06 – 2013-01-08 (×4): 150 mg via ORAL
  Filled 2013-01-06 (×5): qty 1

## 2013-01-06 MED ORDER — POTASSIUM CHLORIDE IN NACL 20-0.9 MEQ/L-% IV SOLN
INTRAVENOUS | Status: DC
  Administered 2013-01-07: 12:00:00 via INTRAVENOUS
  Filled 2013-01-06 (×4): qty 1000

## 2013-01-06 MED ORDER — DIAZEPAM 5 MG PO TABS
5.0000 mg | ORAL_TABLET | Freq: Two times a day (BID) | ORAL | Status: DC | PRN
  Administered 2013-01-06 – 2013-01-07 (×2): 5 mg via ORAL
  Filled 2013-01-06 (×2): qty 1

## 2013-01-06 NOTE — Progress Notes (Addendum)
Pt. Was admitted to the floor. Her port was already accessed when she arrived. She stated that her home health nurse accessed her port yesterday and she did not want to be re accessed with our needle. Port was flushed and blood return was noted.  Caps were not able to be changed.

## 2013-01-06 NOTE — Telephone Encounter (Signed)
Patient called reporting she has constipation and when she eats she throws up.  Cannot eat and belches.  For CT this am and to begin contrast at 9:30 am.  Last BM was 01-05-2013.  Reports BM's are small balls, gas rolling around her stomach but rarely comes out.  Denies any abdominal swelling.  Drinks 10 oz water during day and TPN at night.  Takes percocet every night, one stool softener each day and a dulcolax suppository.   To see Amy Allyson Sabal tomorrow am.  Ms. Allyson Sabal notified and will work on rescheduling appts.  Informed patient Ms. Lanae Crumbly or collaborative nurse will call her with appointment changes because today we need to determine what's wrong abdominally and get her bowels moving.  Patient can be reached at [phone].

## 2013-01-06 NOTE — Telephone Encounter (Signed)
Called and spoke with patient. Per Amy, PA patient needs to have a KUB today and we will reschedule her CT for tomorrow.  Instructed patient to go to xray department to have KUB and then return to our office for further instructions.  CT rescheduled for 01/07/13 at 2pm and patient aware.

## 2013-01-06 NOTE — H&P (Signed)
ID: [name]   DOB: Feb 11, 1941  MR#: [id number]  CSN#[id number]  PCP:   GYN: Laurette Schimke, MD SU: [name] OTHER MD:   HISTORY OF PRESENT ILLNESS: [name] [name] is a 72 year-old [place] woman I followed remotely for a history of breast cancer. More recently she noted that her belly was getting "bigger". Gradually she has developed increasing low back pain. After a period of several months, as her symptoms increased, she brought this problem to her primary physician's attention and a KUB was obtained on 10/07/2011, which was unremarkable. This was followed by a CT scan 10/15/2011, which showed ascites and omental caking. Paracentesis 10/18/2011 showed ([id number]) malignant cells consistent with metastatic adenocarcinoma, with equivocal staining for estrogen receptor. The pattern of additional stains was most suggestive of a primary gynecologic tumor. CA 125 at this time was 2051.   The patient was referred to gynecologic oncology and on 10/29/2011 underwent exploratory laparotomy under Drs. Cleda Mccreedy and Antionette Char. This consisted of a total abdominal hysterectomy with bilateral salpingo-oophorectomy, omentectomy, and radical tumor debulking. Unfortunately there was significant tumor attached to the diaphragm so the patient was suboptimally debulked. Accordingly a peritoneal catheter was not placed. GYN-ONC recommended 6 cycles of carboplatin/paclitaxel given every 3 weeks, with Neulasta on day 2 for granulocyte support. [name] [name]'s subsequent history is as detailed below.   INTERVAL HISTORY:  [name] [name] was evaluated in the clinic today accompanied by her husband [name] for complaints regarding progressive problems with nausea, anorexia, constipation, and pain. Today is day 9 cycle 5 of liposomal doxorubicin.   REVIEW OF SYSTEMS:   Arlyne tells me she is having "small hard balls" as her only bowel movements. She has a lot of gas and belching. She has a drainage catheter in  her abdomen, but this has drained less than 100 cc in the last week and she does not have problems with abdominal distention at this point she is nausea, but has not been vomiting. On the other hand she also has not been eating she has no breakfast today and no supper last night she is drinking some fluids but not much. She is minimally active, doing a little walking around the house and yard, but mostly sitting around and watching TV. Sometimes her legs feel weak, but they have not "given way". There has not been any bleeding on the Xarelto. There has not been any unusual headaches, changes in her vision, dizziness, gait imbalance, or falls. She has some peeling and a little [name] in the palms of her hands, not in her feet. She did not develop mouth sores with the chemotherapy. She denies fever. A detailed review of systems is otherwise noncontributory.   PAST MEDICAL HISTORY: Past Medical History   Diagnosis  Date   .  History of breast cancer     .  COPD (chronic obstructive pulmonary disease)     .  GERD (gastroesophageal reflux disease)     .  Diabetes mellitus         type II   .  Hx of colonic polyps     .  Osteoarthritis     .  MVP (mitral valve prolapse)     .  Dysrhythmia         pt states runs a little fast    .  Shortness of breath         with exertion    .  Depression     .  Anxiety     .  Heart murmur     .  Cancer         left breast   .  Ovarian cancer         active chemotherapy   .  Stroke     .  Ascites  10/22/2012    PAST SURGICAL HISTORY: Past Surgical History   Procedure  Laterality  Date   .  Cesarean section    1980, 1982       X 2    .  Laparoscopy           work up for infertility   .  Tympanoplasty           left X 2   .  Left breast fibroadenoma removal    1960's   .  Breast cyst aspirations       .  Left modified radial mastectomy       .  Laparotomy    10/29/2011       Procedure: EXPLORATORY LAPAROTOMY;  Surgeon: Rejeana Brock A.  Duard Brady, MD;  Location: [facility];  Service: Gynecology;  Laterality: N/A;   .  Abdominal hysterectomy    10/29/2011       Procedure: HYSTERECTOMY ABDOMINAL;  Surgeon: Rejeana Brock A. Duard Brady, MD;  Location: [facility];  Service: Gynecology;  Laterality: N/A;  Total abdominal hysterectomy   .  Salpingoophorectomy    10/29/2011       Procedure: SALPINGO OOPHERECTOMY;  Surgeon: Rejeana Brock A. Duard Brady, MD;  Location: [facility]; Service: Gynecology;  Laterality: Bilateral;    FAMILY HISTORY Family History   Problem  Relation  Age of Onset   .  Cancer  Mother         endometrial/ colon   .  Cancer  Brother         colon   .  COPD  Other     .  Hypertension  Other     .  Cancer  Maternal Uncle         prostate/bladder/lung   .  Cancer  Paternal Aunt         unknown abdominal cancer   .  Cancer  Maternal Grandmother         Salivary gland cancer   .  Cancer  Paternal Aunt         leukemia     the patient's father died at the age of 57. The patient's mother died at the age of 72; she had a history of colon cancer and endometrial cancer. The patient has no sisters. She has one brother, with a history of colon cancer. There is no history of breast cancer in the family. The patient is scheduled for genetic testing later this month.   GYNECOLOGIC HISTORY: She had menarche age 40. First pregnancy to term was at age 32. She is GX P2. She became menopausal at the time of her chemotherapy for breast cancer. This was age 49. 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 Her husband [name] runs a Psychologist, sport and exercise out of their own home. Daughter [name], 43,  currently lives at home with the patient. There are some issues relating to this daughter that the patient discussed briefly. Daughter. [name], 30, is an ED nurse at [employer]. The patient has no grandchildren. She is not a Advice worker.              ADVANCED DIRECTIVES: in place; the  patient has made it  very clear that in case of a terminal event she does not want resuscitation or life support measures   HEALTH MAINTENANCE: History   Substance Use Topics   .  Smoking status:  Current Some Day Smoker -- 0.50 packs/day for 30 years       Types:  Cigarettes   .  Smokeless tobacco:  Never Used         Comment: quit again 2009; restarted 1 ppd 2011   .  Alcohol Use:  No                Colonoscopy: 2009             PAP: s/p hysterectomy             Bone density: 2010. "good"             Mammography: 2012 NOV--unremarkable    Allergies   Allergen  Reactions   .  Promethazine         "Makes me feel like I want to jump out of my skin"      Current Outpatient Prescriptions   Medication  Sig  Dispense  Refill   .  albuterol-ipratropium (COMBIVENT) 18-103 MCG/ACT inhaler  Inhale 2 puffs into the lungs every 4 (four) hours as needed. For wheezing   14.7 Inhaler   3   .  Alum & Mag Hydroxide-Simeth (MAGIC MOUTHWASH W/LIDOCAINE) SOLN  Take 5 mLs by mouth 4 (four) times daily as needed.   250 mL   1   .  atenolol (TENORMIN) 25 MG tablet  Take 12.5 mg by mouth 2 (two) times daily.         .  budesonide-formoterol (SYMBICORT) 160-4.5 MCG/ACT inhaler  Inhale 2 puffs into the lungs 2 (two) times daily.         Marland Kitchen  buPROPion (WELLBUTRIN SR) 150 MG 12 hr tablet  Take 1 tablet (150 mg total) by mouth 2 (two) times daily.   180 tablet   3   .  busPIRone (BUSPAR) 15 MG tablet  Take 1 tablet (15 mg total) by mouth 2 (two) times daily.   180 tablet   2   .  clopidogrel (PLAVIX) 75 MG tablet  Take 1 tablet (75 mg total) by mouth daily with breakfast.   60 tablet   3   .  COMBIVENT RESPIMAT 20-100 MCG/ACT AERS respimat  INHALE 2 PUFFS BY MOUTH EVERY 4 HOURS ASNEEDED FOR WHEEZING   4 g   5   .  diazepam (VALIUM) 5 MG tablet  TAKE 1 TO 2 TABLETS BY MOUTH EVERY NIGHTAT BEDRIME AS NEEDED FOR INSOMNIA   60 tablet   2   .  lovastatin (MEVACOR) 40 MG tablet  Take 1 tablet (40 mg total) by mouth daily.   90 tablet   2    .  morphine 20 MG/5ML solution  Take 2.5 mLs (10 mg total) by mouth every 3 (three) hours as needed for pain.   100 mL   0   .  omeprazole (PRILOSEC) 40 MG capsule  Take 1 capsule (40 mg total) by mouth daily.   90 capsule   3   .  ondansetron (ZOFRAN) 8 MG tablet  Take 1 tablet (8 mg total) by mouth 2 (two) times daily. Take two times a day starting the day after chemo for 2 days. Then take two times a day as needed for nausea or vomiting.  30 tablet   1   .  ONE TOUCH ULTRA TEST test strip  USE AS DIRECTED   100 each   0   .  oxyCODONE-acetaminophen (PERCOCET/ROXICET) 5-325 MG per tablet  Take 1 tablet by mouth every 6 (six) hours as needed for pain.   100 tablet   0   .  prochlorperazine (COMPAZINE) 10 MG tablet  Take 1 tablet (10 mg total) by mouth every 6 (six) hours as needed (Nausea or vomiting).   30 tablet   1   .  sertraline (ZOLOFT) 100 MG tablet  Take 1 tablet (100 mg total) by mouth daily before breakfast.   90 tablet   3   .  [DISCONTINUED] busPIRone (BUSPAR) 15 MG tablet  Take 1 tablet (15 mg total) by mouth 2 (two) times daily.   180 tablet   1   .  [DISCONTINUED] roflumilast (DALIRESP) 500 MCG TABS tablet  Take 500 mcg by mouth daily.             OBJECTIVE: Older white woman in moderate distress  Filed Vitals:   There were no vitals filed for this visit.  Sclerae unicteric Oropharynx clear, slightly dry   Lungs clear bilaterally to auscultation, no significant dullness to percussion Heart regular rate and rhythm, no murmur appreciated Abdomen soft, nontender, nondistended, positive bowel sounds, Aspira drain is in the lower right abdomen. MSK  no peripheral edema Neuro: nonfocal, well oriented, appropriate affect Breasts: Port is intact in the right upper chest wall, accessed    LAB RESULTS: Basic Metabolic Panel: No results found for this basename: NA, K, CL, CO2, GLUCOSE, BUN, CREATININE, CALCIUM, MG, PHOS,  in the last 168 hours GFR The CrCl is unknown because  both a height and weight (above a minimum accepted value) are required for this calculation. Liver Function Tests: No results found for this basename: AST, ALT, ALKPHOS, BILITOT, PROT, ALBUMIN,  in the last 168 hours No results found for this basename: LIPASE, AMYLASE,  in the last 168 hours No results found for this basename: AMMONIA,  in the last 168 hours Coagulation profile No results found for this basename: INR, PROTIME,  in the last 168 hours  CBC: No results found for this basename: WBC, NEUTROABS, HGB, HCT, MCV, PLT,  in the last 168 hours Cardiac Enzymes: No results found for this basename: CKTOTAL, CKMB, CKMBINDEX, TROPONINI,  in the last 168 hours BNP: No components found with this basename: POCBNP,  CBG: No results found for this basename: GLUCAP,  in the last 168 hours D-Dimer No results found for this basename: DDIMER,  in the last 72 hours Hgb A1c No results found for this basename: HGBA1C,  in the last 72 hours Lipid Profile No results found for this basename: CHOL, HDL, LDLCALC, TRIG, CHOLHDL, LDLDIRECT,  in the last 72 hours Thyroid function studies No results found for this basename: TSH, T4TOTAL, FREET3, T3FREE, THYROIDAB,  in the last 72 hours Anemia work up No results found for this basename: VITAMINB12, FOLATE, FERRITIN, TIBC, IRON, RETICCTPCT,  in the last 72 hours Microbiology No results found for this or any previous visit (from the past 240 hour(s)).    STUDIES: Dg Chest 2 View  12/15/2012   *RADIOLOGY REPORT*  Clinical Data: COPD.  History of left mastectomy.  History of ovarian carcinoma.  CHEST - 2 VIEW  Comparison: PA and lateral chest 09/24/2012.  Findings: Since the prior study, the patient has developed small bilateral pleural effusions, larger  on the left.  Lungs are clear. Heart size is normal.  The patient is status post left mastectomy and axillary dissection.  Port-A-Cath again noted.  IMPRESSION: New small bilateral pleural effusions, larger  on the left.  No other change.   Original Report Authenticated By: Holley Dexter, M.D.   Dg Abd 1 View  01/06/2013   *RADIOLOGY REPORT*  Clinical Data: Constipation.  ABDOMEN - 1 VIEW  Comparison: CT abdomen and pelvis 11/02/2012 and single view of the abdomen 12/09/2012.  Findings: Drainage catheter in the pelvis is again seen.  There is gaseous distention of loops of small bowel in the pelvis up to 6.0 cm.  Gas and stool are seen in the rectum.  IMPRESSION: Gaseous distention of small bowel could be due to ileus or obstruction.   Original Report Authenticated By: Holley Dexter, M.D.   Dg Abd 1 View  12/09/2012   *RADIOLOGY REPORT*  Clinical Data: Abdominal pain.  Ovarian cancer  ABDOMEN - 1 VIEW  Comparison: 11/07/2012  Findings: Normal bowel gas pattern.  Negative for bowel obstruction.  Peritoneal catheter overlying the upper pelvis is unchanged.  Degenerative changes at L2-3 with lateral subluxation is unchanged. No acute bony lesion.  IMPRESSION: No acute abnormality.   Original Report Authenticated By: Janeece Riggers, M.D.    Dg Abd 1 View   12/09/2012  *RADIOLOGY REPORT*  Clinical Data: Abdominal pain.  Ovarian cancer  ABDOMEN - 1 VIEW  Comparison: 11/07/2012  Findings: Normal bowel gas pattern.  Negative for bowel obstruction.  Peritoneal catheter overlying the upper pelvis is unchanged.  Degenerative changes at L2-3 with lateral subluxation is unchanged. No acute bony lesion.  IMPRESSION: No acute abnormality.   Original Report Authenticated By: Janeece Riggers, M.D.     ASSESSMENT: 72 y.o. 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 [V323G (1196T>G)] , being admitted with poorly controlled nausea, anorexia, dehydration, and pain in the setting of small bowel obstruction secondary to recurrent ovarian cancer   (1) status post left modified radical mastectomy October 1993 for a T2 N0 invasive ductal breast, estrogen and progesterone receptor positive  cancer treated adjuvantly with MFL (methotrexate/fluorouracil/leucovorin) chemotherapy x6, completed April 1994, followed by 5 years of tamoxifen completed April 1999, with no evidence of recurrence   (2) now status post TAH BSO, omentectomy, and suboptimal debulking 10/29/2011 for a high-grade serous adenocarcinoma of the ovary, pT3c NX (Stage IIIC) , s/p eight cycles of carboplatin/paclitaxel completed 05/05/2012 (initial CA125 of 2050.7 normalized after cycle 5)   (3) recurrent disease documented by rising CEA 125 an abdominal CT scan 09/21/2012.   (4) COPD with ongoing tobacco abuse   (5) diabetes mellitus   (6) genetic testing for Lynch syndrome pending.   (7)  status post hospitalization in July 2013 for stroke/small acute infarcts in the left sub insular white matter and coronal radiata.   (8) port in place   (9) left upper lobe 6 mm subpleural nodule, without associated hypermetabolic activity (likely well below the resolution of PET imaging) noted August 2013   (10) CTs obtained in February 2014 showed evidence of significant progression of disease with widespread intraperitoneal metastases and a moderate volume of presumably malignant ascites.   (11) currently being treated with Doxil on a Q. three-week basis, first dose given on 10/06/2012, fifth cycle given 12/29/2012   (12) cholecystitis, with elective cholecystectomy pending   (13)  recurring ascites, requiring therapeutic paracentesis, status post pending placement of Aspira drain   (  14) history of apparent infiltration of TPN  PLAN:   We are admitting Jonny Ruiz so that we can continue hydration and relieve her nausea and pain. If she is indeed obstructive we will treat conservatively. If possible we will try to obtain a CT of the abdomen with contrast this admission to restage her response to treatment. So I expect the patient to be discharged in a few days, in accordance with her wishes I am writing a DO NOT  RESUSCITATE order.

## 2013-01-06 NOTE — Progress Notes (Signed)
PARENTERAL NUTRITION CONSULT NOTE - INITIAL  Pharmacy Consult for TNA Indication:   Allergies  Allergen Reactions  . Promethazine     "Makes me feel like I want to jump out of my skin"   Patient Measurements:   IBW: 54.2kg TBW: 56.2kg  Vital Signs: Temp: 98.2 F (36.8 C) (06/04 1307) Temp src: Oral (06/04 1307) BP: 112/65 mmHg (06/04 1307) Pulse Rate: 100 (06/04 1307) Intake/Output from previous day:   Intake/Output from this shift:    Labs: No results found for this basename: WBC, HGB, HCT, PLT, APTT, INR,  in the last 72 hours  No results found for this basename: NA, K, CL, CO2, GLUCOSE, BUN, CREATININE, LABCREA, CREAT24HRUR, CALCIUM, MG, PHOS, PROT, ALBUMIN, AST, ALT, ALKPHOS, BILITOT, BILIDIR, IBILI, PREALBUMIN, TRIG, CHOLHDL, CHOL,  in the last 72 hours The CrCl is unknown because both a height and weight (above a minimum accepted value) are required for this calculation.   No results found for this basename: GLUCAP,  in the last 72 hours  Medical History: Past Medical History  Diagnosis Date  . History of breast cancer   . COPD (chronic obstructive pulmonary disease)   . GERD (gastroesophageal reflux disease)   . Diabetes mellitus     type II  . Hx of colonic polyps   . Osteoarthritis   . MVP (mitral valve prolapse)   . Dysrhythmia     pt states runs a little fast   . Shortness of breath     with exertion   . Depression   . Anxiety   . Heart murmur   . Cancer     left breast  . Ovarian cancer     active chemotherapy  . Stroke   . Ascites 10/22/2012   Medications:  Scheduled:  . atenolol  12.5 mg Oral BID  . budesonide-formoterol  2 puff Inhalation Q12H  . buPROPion  150 mg Oral BID  . busPIRone  15 mg Oral BID  . [START ON 01/07/2013] clopidogrel  75 mg Oral Q breakfast  . enoxaparin (LOVENOX) injection  40 mg Subcutaneous Q24H  . pantoprazole  40 mg Oral Daily  . [START ON 01/07/2013] sertraline  100 mg Oral QAC breakfast   Infusions:  . 0.9 %  NaCl with KCl 20 mEq / L 100 mL/hr at 01/06/13 1556   Insulin Requirements in the past 24 hours:  Regular Insulin 30 units added daily to TNA  Current Nutrition:  PO diet and TNA at home delivering 84 gm protein, 252 gm Dextrose daily. Rate noted as infused over 14 hr, 1hr ramp up and ramp down; total . Will infuse at 71ml/hr x 1 hr, then 130 ml/hr x 12 hr, then 63ml/hr x1, off x 10 hr.  Assessment: 41 yoF with recurrence of Ca: hx of breast CA (Completed Carbo/Taxol x 6 cycles), new diagnosis Ovarian Ca, with malignant ascites; current chemo Cycle 5 Liposomal Doxorubicin completed 5/27, Cycle 6 due 6/16.  Admit for nausea, constipation, abd pain. Ileus vs obstruction, plan abd CT.  Labs wnl  PAC accessed, use for TNA infusion  Nutritional Goals:  Continuing home TNA tonight 6/4. IV team aware  Plan:  Home TNA tonight Add 30 units regular insulin (verified that no insulin added before TNA delivered to Pharmacy) Hold IV fluids while TNA infusing  Otho Bellows PharmD Pager [phone] 01/06/2013, 9:30 PM

## 2013-01-07 ENCOUNTER — Ambulatory Visit (HOSPITAL_COMMUNITY)
Admission: RE | Admit: 2013-01-07 | Discharge: 2013-01-07 | Disposition: A | Discharge status: Home / Self Care | Payer: Medicare Other | Source: Ambulatory Visit | Attending: Physician Assistant | Admitting: Physician Assistant

## 2013-01-07 ENCOUNTER — Observation Stay (HOSPITAL_COMMUNITY): Payer: Medicare Other

## 2013-01-07 ENCOUNTER — Other Ambulatory Visit: Payer: Medicare Other | Admitting: Lab

## 2013-01-07 ENCOUNTER — Encounter: Payer: Medicare Other | Admitting: Physician Assistant

## 2013-01-07 ENCOUNTER — Ambulatory Visit: Payer: Medicare Other | Admitting: Oncology

## 2013-01-07 ENCOUNTER — Encounter (HOSPITAL_COMMUNITY): Payer: Self-pay

## 2013-01-07 VITALS — BP 112/65 | HR 100 | Temp 98.2°F | Resp 20 | Ht 64.0 in | Wt 124.7 lb

## 2013-01-07 DIAGNOSIS — R188 Other ascites: Secondary | ICD-10-CM

## 2013-01-07 DIAGNOSIS — C569 Malignant neoplasm of unspecified ovary: Secondary | ICD-10-CM

## 2013-01-07 DIAGNOSIS — R140 Abdominal distension (gaseous): Secondary | ICD-10-CM

## 2013-01-07 DIAGNOSIS — K56600 Partial intestinal obstruction, unspecified as to cause: Secondary | ICD-10-CM

## 2013-01-07 DIAGNOSIS — R112 Nausea with vomiting, unspecified: Secondary | ICD-10-CM

## 2013-01-07 DIAGNOSIS — K5669 Other intestinal obstruction: Secondary | ICD-10-CM

## 2013-01-07 HISTORY — DX: Malignant neoplasm of unspecified site of unspecified female breast: C50.919

## 2013-01-07 LAB — COMPREHENSIVE METABOLIC PANEL
Alkaline Phosphatase: 112 U/L (ref 39–117)
BUN: 18 mg/dL (ref 6–23)
GFR calc Af Amer: >90 mL/min (ref >90)
Glucose, Bld: 178 mg/dL — ABNORMAL HIGH (ref 70–99)
Potassium: 3.6 mEq/L (ref 3.5–5.1)
Total Bilirubin: 0.1 mg/dL — ABNORMAL LOW (ref 0.3–1.2)
Total Protein: 6.1 g/dL (ref 6.0–8.3)

## 2013-01-07 LAB — CBC
HCT: 26.7 % — ABNORMAL LOW (ref 36.0–46.0)
Hemoglobin: 8.5 g/dL — ABNORMAL LOW (ref 12.0–15.0)
MCH: 27.8 pg (ref 26.0–34.0)
MCHC: 31.8 g/dL (ref 30.0–36.0)

## 2013-01-07 LAB — DIFFERENTIAL
Basophils Relative: 1 % (ref 0–1)
Eosinophils Absolute: 0.1 10*3/uL (ref 0.0–0.7)
Eosinophils Relative: 1 % (ref 0–5)
Monocytes Absolute: 0.7 10*3/uL (ref 0.1–1.0)
Monocytes Relative: 10 % (ref 3–12)

## 2013-01-07 LAB — PREALBUMIN: Prealbumin: 9.9 mg/dL — ABNORMAL LOW (ref 17.0–34.0)

## 2013-01-07 LAB — CHOLESTEROL, TOTAL: Cholesterol: 122 mg/dL (ref 0–200)

## 2013-01-07 MED ORDER — TRAVASOL 10 % IV SOLN: INTRAVENOUS | Status: AC

## 2013-01-07 MED ORDER — ALBUTEROL SULFATE (5 MG/ML) 0.5% IN NEBU
2.5000 mg | INHALATION_SOLUTION | RESPIRATORY_TRACT | Status: DC | PRN
  Administered 2013-01-07 – 2013-01-08 (×3): 2.5 mg via RESPIRATORY_TRACT
  Filled 2013-01-07 (×3): qty 0.5

## 2013-01-07 MED ORDER — POLYETHYLENE GLYCOL 3350 17 G PO PACK
17.0000 g | PACK | Freq: Every day | ORAL | Status: DC
  Filled 2013-01-07 (×2): qty 1

## 2013-01-07 MED ORDER — DOCUSATE SODIUM 100 MG PO CAPS
200.0000 mg | ORAL_CAPSULE | Freq: Two times a day (BID) | ORAL | Status: DC
  Administered 2013-01-07: 200 mg via ORAL
  Filled 2013-01-07 (×4): qty 2

## 2013-01-07 MED ORDER — IOHEXOL 300 MG/ML  SOLN
25.0000 mL | INTRAMUSCULAR | Status: AC
  Administered 2013-01-07 (×2): 25 mL via ORAL

## 2013-01-07 MED ORDER — TRAVASOL 10 % IV SOLN
INTRAVENOUS | Status: DC
  Filled 2013-01-07: qty 840

## 2013-01-07 MED ORDER — IOHEXOL 300 MG/ML  SOLN
100.0000 mL | Freq: Once | INTRAMUSCULAR | Status: AC | PRN
  Administered 2013-01-07: 100 mL via INTRAVENOUS

## 2013-01-07 NOTE — Care Management Note (Signed)
CARE MANAGEMENT NOTE 01/07/2013  Patient:  [name] [name], [name] [name]   Account Number:  [id number]  Date Initiated:  01/07/2013  Documentation initiated by:  Saachi Zale  Subjective/Objective Assessment:   72 yo female admitted with dehydration, nausea, and possible bowel obstruction.     Action/Plan:   Home when stable   Anticipated DC Date:     Anticipated DC Plan:  HOME W HOME HEALTH SERVICES      DC Planning Services  CM consult      [facility] Choice  HOME HEALTH   Choice offered to / List presented to:  NA   DME arranged  NA      DME agency  NA     HH arranged  HH-1 RN  HH-2 PT      [facility] agency  [facility]   Status of service:  In process, will continue to follow Medicare Important Message given?   (If response is "NO", the following Medicare IM given date fields will be blank) Date Medicare IM given:   Date Additional Medicare IM given:    Discharge Disposition:    Per UR Regulation:  Reviewed for med. necessity/level of care/duration of stay  If discussed at Long Length of Stay Meetings, dates discussed:    Comments:  01/07/13 1436 Catrice Zuleta,RN,BSN [phone] Pt is currently active with [facility] for [facility] & HHPT. Will require resumption of care order from MD upon disharge.

## 2013-01-07 NOTE — Progress Notes (Signed)
INITIAL NUTRITION ASSESSMENT   DOCUMENTATION CODES Per approved criteria  -Not applicable   INTERVENTION: - Recommend MD consider enteral nutrition versus TPN as it appears pt without bowel obstruction per MD notes on CT of abdomen/pelvis  - [employer] Instant Breakfast daily - TPN per pharmacy - Will continue to monitor   NUTRITION DIAGNOSIS: Inadequate oral intake related to vomiting as evidenced by pt statement.   Goal: 1. No further nausea/vomiting 2. Pt to consume >50% of meals/supplements 3. Nutrition support to meet >90% of estimated nutritional needs while PO intake <50% of estimated nutritional needs  Monitor:  Weights, labs, intake, TPN, BM  Reason for Assessment: Consult for TPN  72 y.o. female  Admitting Dx: Nausea and pain  ASSESSMENT: Pt known to RD from previous admission. 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 Pt reported vomiting after meals yesterday in addition to constipation. Pt start on TPN in April 2014 for small bowel obstruction. Pt on home TPN PTA at night that provided 84g protein and 252g dextrose daily which provides a total of 945 calories meeting 57% estimated calorie needs, 99% estimated protein needs. Pt reports 10 pound unintended weight loss in the past 4 weeks. Pt reports her only oral intake at home as 1 baked potato and 1 Carnation Instant Breakfast during the day. Pt reports having on/off nausea PTA. Noted pt only had a bite of her lunch tray and then she reported vomiting afterwards. CT of abdomen and pelvis today showed no evidence of bowel obstruction and pt had BM yesterday.   Height: Ht Readings from Last 1 Encounters:  01/06/13 5\' 4"  (1.626 m)    Weight: Wt Readings from Last 1 Encounters:  01/06/13 124 lb (56.246 kg)    Ideal Body Weight: 120 lb   % Ideal Body Weight: 103  Wt Readings from Last 10 Encounters:  01/06/13 124 lb (56.246 kg)  01/06/13 124 lb 11.2 oz  (56.564 kg)  12/15/12 125 lb 1.6 oz (56.745 kg)  12/08/12 122 lb 8 oz (55.566 kg)  11/17/12 122 lb 11.2 oz (55.656 kg)  11/03/12 134 lb 11.2 oz (61.1 kg)  10/29/12 134 lb 6.4 oz (60.963 kg)  10/27/12 136 lb 8 oz (61.916 kg)  10/22/12 139 lb 3.2 oz (63.141 kg)  10/13/12 139 lb 8 oz (63.277 kg)    Usual Body Weight: 134 lb  % Usual Body Weight: 92  BMI:  Body mass index is 21.27 kg/(m^2).  Estimated Nutritional Needs: Kcal: 1650-1950 Protein: 85-95g Fluid: 1.6-1.9L/day  Skin: Intact   Diet Order: General  EDUCATION NEEDS: -No education needs identified at this time   Intake/Output Summary (Last 24 hours) at 01/07/13 1522 Last data filed at 01/07/13 0648  Gross per 24 hour  Intake     60 ml  Output   1000 ml  Net   -940 ml    Last BM: 6/4  Labs:   Recent Labs Lab 01/06/13 1458 01/07/13 0407  NA 138 135  K 3.9 3.6  CL 99 99  CO2 29 28  BUN 21 18  CREATININE 0.45* 0.44*  CALCIUM 9.0 8.8  MG  --  1.7  PHOS  --  3.4  GLUCOSE 118* 178*    CBG (last 3)  No results found for this basename: GLUCAP,  in the last 72 hours  Scheduled Meds: . atenolol  12.5 mg Oral BID  . budesonide-formoterol  2 puff Inhalation Q12H  . buPROPion  150 mg Oral BID  . busPIRone  15 mg Oral BID  . clopidogrel  75 mg Oral Q breakfast  . docusate sodium  200 mg Oral BID  . enoxaparin (LOVENOX) injection  40 mg Subcutaneous Q24H  . pantoprazole  40 mg Oral Daily  . polyethylene glycol  17 g Oral Daily  . sertraline  100 mg Oral QAC breakfast    Continuous Infusions: . 0.9 % NaCl with KCl 20 mEq / L 100 mL/hr at 01/07/13 1204  . ADULT TPN 50 mL/hr at 01/06/13 2230  . ADULT TPN      Past Medical History  Diagnosis Date  . History of breast cancer   . COPD (chronic obstructive pulmonary disease)   . GERD (gastroesophageal reflux disease)   . Diabetes mellitus     type II  . Hx of colonic polyps   . Osteoarthritis   . MVP (mitral valve prolapse)   . Dysrhythmia      pt states runs a little fast   . Shortness of breath     with exertion   . Depression   . Anxiety   . Heart murmur   . Stroke   . Ascites 10/22/2012  . metastases dx'd 10/2011  . Ovarian cancer dx'd2/2013    active chemotherapy  . Breast CA 1993    Past Surgical History  Procedure Laterality Date  . Cesarean section  1980, 1982    X 2   . Laparoscopy      work up for infertility  . Tympanoplasty      left X 2  . Left breast fibroadenoma removal  1960's  . Breast cyst aspirations    . Left modified radial mastectomy      >20 years ago  . Laparotomy  10/29/2011    Procedure: EXPLORATORY LAPAROTOMY;  Surgeon: Rejeana Brock A. Duard Brady, MD;  Location: [facility];  Service: Gynecology;  Laterality: N/A;  . Abdominal hysterectomy  10/29/2011    Procedure: HYSTERECTOMY ABDOMINAL;  Surgeon: Rejeana Brock A. Duard Brady, MD;  Location: [facility];  Service: Gynecology;  Laterality: N/A;  Total abdominal hysterectomy  . Salpingoophorectomy  10/29/2011    Procedure: SALPINGO OOPHERECTOMY;  Surgeon: Rejeana Brock A. Duard Brady, MD;  Location: [facility];  Service: Gynecology;  Laterality: Bilateral;     Levon Hedger MS, RD, LDN [phone] Pager [phone] After Hours Pager

## 2013-01-07 NOTE — Progress Notes (Signed)
[name] [name]   DOB:1941/06/28   XB#:[id number]   CSN#:[id number]  Subjective: just now vomited again; had difficulty keeping the contrast down, but "did my best." Remains very weak. Daughter in room..  Objective: middle aged white woman examined in bed Filed Vitals:   01/07/13 0455  BP: 106/55  Pulse: 84  Temp: 98.2 F (36.8 C)  Resp: 16    Body mass index is 21.27 kg/(m^2).  Intake/Output Summary (Last 24 hours) at 01/07/13 1314 Last data filed at 01/07/13 0648  Gross per 24 hour  Intake     60 ml  Output   1000 ml  Net   -940 ml     Sclerae unicteric  Oropharynx clear  No peripheral adenopathy  Lungs clear -- no rales or rhonchi  Heart regular rate and rhythm  Abdomen soft, minimallt distended, +BS  MSK no focal spinal tenderness, no peripheral edema  Neuro nonfocal  Breast exam: deferred  CBG (last 3)  No results found for this basename: GLUCAP,  in the last 72 hours   Labs:  Lab Results  Component Value Date   WBC 7.0 01/07/2013   HGB 8.5* 01/07/2013   HCT 26.7* 01/07/2013   MCV 87.3 01/07/2013   PLT 432* 01/07/2013   NEUTROABS 4.8 01/07/2013    @LASTCHEMISTRY @  Urine Studies No results found for this basename: UACOL, UAPR, USPG, UPH, UTP, UGL, UKET, UBIL, UHGB, UNIT, UROB, ULEU, UEPI, UWBC, URBC, UBAC, CAST, CRYS, UCOM, BILUA,  in the last 72 hours  Basic Metabolic Panel:  Recent Labs Lab 01/06/13 1458 01/07/13 0407  NA 138 135  K 3.9 3.6  CL 99 99  CO2 29 28  GLUCOSE 118* 178*  BUN 21 18  CREATININE 0.45* 0.44*  CALCIUM 9.0 8.8  MG  --  1.7  PHOS  --  3.4   GFR Estimated Creatinine Clearance: 57.3 ml/min (by C-G formula based on Cr of 0.44). Liver Function Tests:  Recent Labs Lab 01/06/13 1458 01/07/13 0407  AST 15 12  ALT 14 12  ALKPHOS 131* 112  BILITOT 0.1* 0.1*  PROT 6.3 6.1  ALBUMIN 2.4* 2.2*   No results found for this basename: LIPASE, AMYLASE,  in the last 168 hours No results found for this basename: AMMONIA,  in the last 168  hours Coagulation profile No results found for this basename: INR, PROTIME,  in the last 168 hours  CBC:  Recent Labs Lab 01/06/13 1458 01/07/13 0407  WBC 8.7 7.0  NEUTROABS 6.1 4.8  HGB 9.4* 8.5*  HCT 28.5* 26.7*  MCV 87.2 87.3  PLT 547* 432*   Cardiac Enzymes: No results found for this basename: CKTOTAL, CKMB, CKMBINDEX, TROPONINI,  in the last 168 hours BNP: No components found with this basename: POCBNP,  CBG: No results found for this basename: GLUCAP,  in the last 168 hours D-Dimer No results found for this basename: DDIMER,  in the last 72 hours Hgb A1c No results found for this basename: HGBA1C,  in the last 72 hours Lipid Profile  Recent Labs  01/07/13 0407  CHOL 122  TRIG 118   Thyroid function studies No results found for this basename: TSH, T4TOTAL, FREET3, T3FREE, THYROIDAB,  in the last 72 hours Anemia work up No results found for this basename: VITAMINB12, FOLATE, FERRITIN, TIBC, IRON, RETICCTPCT,  in the last 72 hours Microbiology No results found for this or any previous visit (from the past 240 hour(s)).    Studies:  Dg Abd 1 View  01/07/2013   *RADIOLOGY REPORT*  Clinical Data: 72 year old female lower abdominal pain.  Possible bowel obstruction.  ABDOMEN - 1 VIEW  Comparison: 01/06/2013 and earlier.  Findings: AP portable supine view 0731 hours.  Percutaneous catheter again projects through the lower abdomen and pelvis. Interval improved bowel gas pattern with decreased gaseous distention of small bowel.  Ascending and descending colon gas plus gas in the rectum remain visible. Stable visualized osseous structures.  Probable small pleural effusions. No definite pneumoperitoneum.  IMPRESSION: 1.  Interval improved bowel gas pattern, nonobstructed. 2.  Small pleural effusions suspected. 3.  Lower abdominal/pelvic catheter remains in place.   Original Report Authenticated By: Erskine Speed, M.D.   Dg Abd 1 View  01/06/2013   *RADIOLOGY REPORT*  Clinical  Data: Constipation.  ABDOMEN - 1 VIEW  Comparison: CT abdomen and pelvis 11/02/2012 and single view of the abdomen 12/09/2012.  Findings: Drainage catheter in the pelvis is again seen.  There is gaseous distention of loops of small bowel in the pelvis up to 6.0 cm.  Gas and stool are seen in the rectum.  IMPRESSION: Gaseous distention of small bowel could be due to ileus or obstruction.   Original Report Authenticated By: Holley Dexter, M.D.   Ct Abdomen Pelvis W Contrast  01/07/2013   *RADIOLOGY REPORT*  Clinical Data: Abdominal pain, distension, nausea, weight loss, constipation, metastatic ovarian cancer  CT ABDOMEN AND PELVIS WITH CONTRAST  Technique:  Multidetector CT imaging of the abdomen and pelvis was performed following the standard protocol during bolus administration of intravenous contrast.  Contrast: OMNIPAQUE IOHEXOL 300 MG/ML  SOLN  Comparison: 11/02/2012  Findings: Moderate bilateral pleural effusions, new.  Liver, spleen, pancreas, and adrenal glands are within normal limits.  Distended gallbladder, although improved.  No intrahepatic or extrahepatic ductal dilatation.  4 mm nonobstructing left lower pole renal calculus (series 2/image 32).  5 mm right lower pole renal cyst.  No hydronephrosis.  Small volume abdominal ascites, similar.  Peritoneal drainage catheter terminates in the left mid abdomen (series 2/image 54).  3.8 x 6.0 cm fluid collection posterior to the stomach (series 2/image 25), now with a developing thin rim.  Peritoneal carcinomatosis, mildly improved.  For example, omental caking measures 4.0 x 10.2 cm beneath the left anterior abdominal wall (series 2/image 34), previously 5.2 x 10.9 cm.  A 3.6 x 3.5 cm gastrohepatic ligament implant (series 2/image 26) previously measured 4.0 x 3.7 cm.  Additional area of tumor in the lower pelvis is difficult to discretely measure.  No evidence of bowel obstruction. While mildly prominent loops of small bowel are present in the  left lower abdomen, contrast opacifies the proximal colon.  Atherosclerotic calcifications of the abdominal aorta and branch vessels.  Bladder is within normal limits.  Degenerative changes of the visualized thoracolumbar spine.  IMPRESSION: Peritoneal carcinomatosis, mildly improved.  Small volume abdominal ascites with peritoneal drainage catheter, grossly unchanged.  Developing loculated 3.8 x 6.0 cm fluid collection posterior to the stomach.  Moderate bilateral pleural effusions, new.  No evidence of bowel obstruction.  Additional stable ancillary findings as above.   Original Report Authenticated By: Charline Bills, M.D.    Assessment: 72 y.o. 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 [V323G (1196T>G)] , being admitted with poorly controlled nausea, anorexia, dehydration, and pain in the setting of small bowel obstruction secondary to recurrent ovarian cancer  (1) status post left modified radical mastectomy October 1993 for a T2 N0  invasive ductal breast, estrogen and progesterone receptor positive cancer treated adjuvantly with MFL (methotrexate/fluorouracil/leucovorin) chemotherapy x6, completed April 1994, followed by 5 years of tamoxifen completed April 1999, with no evidence of recurrence  (2) now status post TAH BSO, omentectomy, and suboptimal debulking 10/29/2011 for a high-grade serous adenocarcinoma of the ovary, pT3c NX (Stage IIIC) , s/p eight cycles of carboplatin/paclitaxel completed 05/05/2012 (initial CA125 of 2050.7 normalized after cycle 5)  (3) recurrent disease documented by rising CEA 125 an abdominal CT scan 09/21/2012.  (4) COPD with ongoing tobacco abuse  (5) diabetes mellitus  (6) genetic testing for Lynch syndrome pending.  (7) status post hospitalization in July 2013 for stroke/small acute infarcts in the left sub insular white matter and coronal radiata.  (8) port in place  (9) left upper lobe 6 mm subpleural nodule, without  associated hypermetabolic activity (likely well below the resolution of PET imaging) noted August 2013  (10) CTs obtained in February 2014 showed evidence of significant progression of disease with widespread intraperitoneal metastases and a moderate volume of presumably malignant ascites.  (11) currently being treated with Doxil on a Q. three-week basis, first dose given on 10/06/2012, fifth cycle given 12/29/2012  (12) cholecystitis, with elective cholecystectomy pending  (13) recurring ascites, requiring therapeutic paracentesis, status post pending placement of Aspira drain  (14) history of apparent infiltration of TPN-resolved     Plan: the CT results are encouraging. I am going to increase her bowel prophylaxis and we will try to give chemo Q3 weeks instead of Q 4 weeks. Anticipate D/C in AM.   Lowella Dell, MD 01/07/2013  1:14 PM

## 2013-01-08 ENCOUNTER — Telehealth: Payer: Self-pay | Admitting: *Deleted

## 2013-01-08 ENCOUNTER — Encounter: Payer: Self-pay | Admitting: Oncology

## 2013-01-08 ENCOUNTER — Other Ambulatory Visit: Payer: Self-pay | Admitting: Oncology

## 2013-01-08 DIAGNOSIS — R109 Unspecified abdominal pain: Secondary | ICD-10-CM

## 2013-01-08 DIAGNOSIS — C569 Malignant neoplasm of unspecified ovary: Secondary | ICD-10-CM

## 2013-01-08 LAB — BASIC METABOLIC PANEL
CO2: 28 mEq/L (ref 19–32)
Calcium: 9.1 mg/dL (ref 8.4–10.5)
Creatinine, Ser: 0.45 mg/dL — ABNORMAL LOW (ref 0.50–1.10)
Glucose, Bld: 146 mg/dL — ABNORMAL HIGH (ref 70–99)
Sodium: 134 mEq/L — ABNORMAL LOW (ref 135–145)

## 2013-01-08 LAB — MAGNESIUM: Magnesium: 1.8 mg/dL (ref 1.5–2.5)

## 2013-01-08 MED ORDER — ALBUTEROL SULFATE (5 MG/ML) 0.5% IN NEBU
2.5000 mg | INHALATION_SOLUTION | RESPIRATORY_TRACT | Status: DC
  Administered 2013-01-08 (×2): 2.5 mg via RESPIRATORY_TRACT
  Filled 2013-01-08 (×2): qty 0.5

## 2013-01-08 MED ORDER — ALBUTEROL SULFATE (5 MG/ML) 0.5% IN NEBU: 2.5000 mg | INHALATION_SOLUTION | RESPIRATORY_TRACT | Status: DC | PRN

## 2013-01-08 MED ORDER — ALPRAZOLAM 0.5 MG PO TABS
0.5000 mg | ORAL_TABLET | Freq: Once | ORAL | Status: AC
  Administered 2013-01-08: 0.5 mg via ORAL
  Filled 2013-01-08: qty 1

## 2013-01-08 MED ORDER — HEPARIN SOD (PORK) LOCK FLUSH 100 UNIT/ML IV SOLN
500.0000 [IU] | INTRAVENOUS | Status: AC | PRN
  Administered 2013-01-08: 500 [IU]

## 2013-01-08 NOTE — Discharge Summary (Signed)
Physician Discharge Summary  Patient ID: [name] [name] MRN: [id number] DOB/AGE: 11-10-1940 72 y.o.  Admit date: 01/06/2013 Discharge date: 01/08/2013  Primary Care Physician:  [name] Schimke, MD   Discharge Diagnoses:  Uncontrolled nausea and vomiting, abdominal pain, ovarian cancer, partial bowel obstruction  Present on Admission:  **None** History of breast cancer  .  COPD (chronic obstructive pulmonary disease)  .  GERD (gastroesophageal reflux disease)  .  Diabetes mellitus  type II  .  Hx of colonic polyps  .  Osteoarthritis  .  MVP (mitral valve prolapse)  .  Dysrhythmia  pt states runs a little fast  .  Shortness of breath  with exertion  .  Depression  .  Anxiety  .  Heart murmur  .  Cancer  left breast  .  Ovarian cancer  active chemotherapy  .  Stroke  .  Ascites  10/22/2012   Discharge Medications:    Medication List    TAKE these medications       albuterol (2.5 MG/3ML) 0.083% nebulizer solution  Commonly known as:  PROVENTIL  Take 2.5 mg by nebulization every 6 (six) hours as needed for wheezing.     atenolol 25 MG tablet  Commonly known as:  TENORMIN  Take 12.5 mg by mouth 2 (two) times daily.     budesonide-formoterol 160-4.5 MCG/ACT inhaler  Commonly known as:  SYMBICORT  Inhale 2 puffs into the lungs 2 (two) times daily.     buPROPion 150 MG 12 hr tablet  Commonly known as:  WELLBUTRIN SR  Take 1 tablet (150 mg total) by mouth 2 (two) times daily.     busPIRone 15 MG tablet  Commonly known as:  BUSPAR  Take 1 tablet (15 mg total) by mouth 2 (two) times daily.     clopidogrel 75 MG tablet  Commonly known as:  PLAVIX  Take 1 tablet (75 mg total) by mouth daily with breakfast.     COMBIVENT RESPIMAT 20-100 MCG/ACT Aers respimat  Generic drug:  Ipratropium-Albuterol  Inhale 1 puff into the lungs every 6 (six) hours as needed for wheezing or shortness of breath.     diazepam 5 MG tablet  Commonly known as:   VALIUM  Take 5-10 mg by mouth at bedtime as needed for sleep.     lovastatin 40 MG tablet  Commonly known as:  MEVACOR  Take 1 tablet (40 mg total) by mouth daily.     omeprazole 40 MG capsule  Commonly known as:  PRILOSEC  Take 1 capsule (40 mg total) by mouth daily.     ONE TOUCH ULTRA TEST test strip  Generic drug:  glucose blood  USE AS DIRECTED     oxyCODONE-acetaminophen 5-325 MG per tablet  Commonly known as:  PERCOCET/ROXICET  Take 1 tablet by mouth every 6 (six) hours as needed for pain.     PRESCRIPTION MEDICATION  Inject into the vein every 21 ( twenty-one) days. Doxil infusion q3weeks     PRESCRIPTION MEDICATION  at bedtime. Home Paranatal Nutrition from [facility].     prochlorperazine 10 MG tablet  Commonly known as:  COMPAZINE  Take 1 tablet (10 mg total) by mouth every 6 (six) hours as needed (Nausea or vomiting).     ranitidine 150 MG tablet  Commonly known as:  ZANTAC  Take 1 tablet (150 mg total) by mouth 2 (two) times daily.     sertraline 100 MG tablet  Commonly known as:  ZOLOFT  Take 1 tablet (100 mg total) by mouth daily before breakfast.     ZOFRAN 4 MG/2ML Soln injection  Generic drug:  ondansetron  Inject 4 mg into the vein at bedtime. Pt gets through her port.     ondansetron 8 MG tablet  Commonly known as:  ZOFRAN  Take 8 mg by mouth every 8 (eight) hours as needed for nausea.         Disposition and Follow-up: to home, under care of family  Significant Diagnostic Studies:  Dg Chest 2 View  12/15/2012   *RADIOLOGY REPORT*  Clinical Data: COPD.  History of left mastectomy.  History of ovarian carcinoma.  CHEST - 2 VIEW  Comparison: PA and lateral chest 09/24/2012.  Findings: Since the prior study, the patient has developed small bilateral pleural effusions, larger on the left.  Lungs are clear. Heart size is normal.  The patient is status post left mastectomy and axillary dissection.  Port-A-Cath again noted.  IMPRESSION: New  small bilateral pleural effusions, larger on the left.  No other change.   Original Report Authenticated By: Holley Dexter, M.D.   Dg Abd 1 View  01/07/2013   *RADIOLOGY REPORT*  Clinical Data: 72 year old female lower abdominal pain.  Possible bowel obstruction.  ABDOMEN - 1 VIEW  Comparison: 01/06/2013 and earlier.  Findings: AP portable supine view 0731 hours.  Percutaneous catheter again projects through the lower abdomen and pelvis. Interval improved bowel gas pattern with decreased gaseous distention of small bowel.  Ascending and descending colon gas plus gas in the rectum remain visible. Stable visualized osseous structures.  Probable small pleural effusions. No definite pneumoperitoneum.  IMPRESSION: 1.  Interval improved bowel gas pattern, nonobstructed. 2.  Small pleural effusions suspected. 3.  Lower abdominal/pelvic catheter remains in place.   Original Report Authenticated By: Erskine Speed, M.D.   Dg Abd 1 View  01/06/2013   *RADIOLOGY REPORT*  Clinical Data: Constipation.  ABDOMEN - 1 VIEW  Comparison: CT abdomen and pelvis 11/02/2012 and single view of the abdomen 12/09/2012.  Findings: Drainage catheter in the pelvis is again seen.  There is gaseous distention of loops of small bowel in the pelvis up to 6.0 cm.  Gas and stool are seen in the rectum.  IMPRESSION: Gaseous distention of small bowel could be due to ileus or obstruction.   Original Report Authenticated By: Holley Dexter, M.D.   Dg Abd 1 View  12/09/2012   *RADIOLOGY REPORT*  Clinical Data: Abdominal pain.  Ovarian cancer  ABDOMEN - 1 VIEW  Comparison: 11/07/2012  Findings: Normal bowel gas pattern.  Negative for bowel obstruction.  Peritoneal catheter overlying the upper pelvis is unchanged.  Degenerative changes at L2-3 with lateral subluxation is unchanged. No acute bony lesion.  IMPRESSION: No acute abnormality.   Original Report Authenticated By: Janeece Riggers, M.D.   Ct Abdomen Pelvis W Contrast  01/07/2013   *RADIOLOGY  REPORT*  Clinical Data: Abdominal pain, distension, nausea, weight loss, constipation, metastatic ovarian cancer  CT ABDOMEN AND PELVIS WITH CONTRAST  Technique:  Multidetector CT imaging of the abdomen and pelvis was performed following the standard protocol during bolus administration of intravenous contrast.  Contrast: OMNIPAQUE IOHEXOL 300 MG/ML  SOLN  Comparison: 11/02/2012  Findings: Moderate bilateral pleural effusions, new.  Liver, spleen, pancreas, and adrenal glands are within normal limits.  Distended gallbladder, although improved.  No intrahepatic or extrahepatic ductal dilatation.  4 mm nonobstructing left lower pole renal calculus (series 2/image 32).  5  mm right lower pole renal cyst.  No hydronephrosis.  Small volume abdominal ascites, similar.  Peritoneal drainage catheter terminates in the left mid abdomen (series 2/image 54).  3.8 x 6.0 cm fluid collection posterior to the stomach (series 2/image 25), now with a developing thin rim.  Peritoneal carcinomatosis, mildly improved.  For example, omental caking measures 4.0 x 10.2 cm beneath the left anterior abdominal wall (series 2/image 34), previously 5.2 x 10.9 cm.  A 3.6 x 3.5 cm gastrohepatic ligament implant (series 2/image 26) previously measured 4.0 x 3.7 cm.  Additional area of tumor in the lower pelvis is difficult to discretely measure.  No evidence of bowel obstruction. While mildly prominent loops of small bowel are present in the left lower abdomen, contrast opacifies the proximal colon.  Atherosclerotic calcifications of the abdominal aorta and branch vessels.  Bladder is within normal limits.  Degenerative changes of the visualized thoracolumbar spine.  IMPRESSION: Peritoneal carcinomatosis, mildly improved.  Small volume abdominal ascites with peritoneal drainage catheter, grossly unchanged.  Developing loculated 3.8 x 6.0 cm fluid collection posterior to the stomach.  Moderate bilateral pleural effusions, new.  No evidence of  bowel obstruction.  Additional stable ancillary findings as above.   Original Report Authenticated By: Charline Bills, M.D.    Discharge Laboratory Values: Basic Metabolic Panel:  Recent Labs Lab 01/06/13 1458 01/07/13 0407 01/08/13 0427  NA 138 135 134*  K 3.9 3.6 3.8  CL 99 99 98  CO2 29 28 28   GLUCOSE 118* 178* 146*  BUN 21 18 18   CREATININE 0.45* 0.44* 0.45*  CALCIUM 9.0 8.8 9.1  MG  --  1.7 1.8  PHOS  --  3.4 3.8   GFR Estimated Creatinine Clearance: 57.3 ml/min (by C-G formula based on Cr of 0.45). Liver Function Tests:  Recent Labs Lab 01/06/13 1458 01/07/13 0407  AST 15 12  ALT 14 12  ALKPHOS 131* 112  BILITOT 0.1* 0.1*  PROT 6.3 6.1  ALBUMIN 2.4* 2.2*   No results found for this basename: LIPASE, AMYLASE,  in the last 168 hours No results found for this basename: AMMONIA,  in the last 168 hours Coagulation profile No results found for this basename: INR, PROTIME,  in the last 168 hours  CBC:  Recent Labs Lab 01/06/13 1458 01/07/13 0407  WBC 8.7 7.0  NEUTROABS 6.1 4.8  HGB 9.4* 8.5*  HCT 28.5* 26.7*  MCV 87.2 87.3  PLT 547* 432*   Cardiac Enzymes: No results found for this basename: CKTOTAL, CKMB, CKMBINDEX, TROPONINI,  in the last 168 hours BNP: No components found with this basename: POCBNP,  CBG: No results found for this basename: GLUCAP,  in the last 168 hours D-Dimer No results found for this basename: DDIMER,  in the last 72 hours Hgb A1c No results found for this basename: HGBA1C,  in the last 72 hours Lipid Profile  Recent Labs  01/07/13 0407  CHOL 122  TRIG 118   Thyroid function studies No results found for this basename: TSH, T4TOTAL, FREET3, T3FREE, THYROIDAB,  in the last 72 hours Anemia work up No results found for this basename: VITAMINB12, FOLATE, FERRITIN, TIBC, IRON, RETICCTPCT,  in the last 72 hours Microbiology No results found for this or any previous visit (from the past 240 hour(s)).   Brief H and  P: For complete details please refer to admission H and P, but in brief, patient was admitted with nausea, vomiting and abdominal pain, and was admitted for evaluation of likely  small bowel obstruction. See Hospital Course for further details  Physical Exam at Discharge: BP 105/48  Pulse 88  Temp(Src) 98.2 F (36.8 C) (Oral)  Resp 20  Ht 5\' 4"  (1.626 m)  Wt 124 lb (56.246 kg)  BMI 21.27 kg/m2  SpO2 97% XBJ:YNWGN white woman examined in bed Cardiovascular: RRR, no murmur appreciated Respiratory: no crackles or wheezes, auscultated anterolaterally Gastrointestinal: slightly distended but soft, NT, +BS Extremities: no peripheral edema    Hospital Course:  Active Problems:   * No active hospital problems. * 72 y.o. 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 The patient was hydrated and symptoms controlled on IV medications, allowing for obtaining a CT scan on the abdomen and pelvis. This did not show complete bowel obstruction, though it can be reqad as partial BO; there was evidence of some tumor response, no evidence of disease progression  (1) status post left modified radical mastectomy October 1993 for a T2 N0 invasive ductal breast, estrogen and progesterone receptor positive cancer treated adjuvantly with MFL (methotrexate/fluorouracil/leucovorin) chemotherapy x6, completed April 1994, followed by 5 years of tamoxifen completed April 1999, with no evidence of recurrence  (2) now status post TAH BSO, omentectomy, and suboptimal debulking 10/29/2011 for a high-grade serous adenocarcinoma of the ovary, pT3c NX (Stage IIIC) , s/p eight cycles of carboplatin/paclitaxel completed 05/05/2012 (initial CA125 of 2050.7 normalized after cycle 5)  (3) recurrent disease documented by rising CEA 125 an abdominal CT scan 09/21/2012.  (4) COPD with  ongoing tobacco abuse  (5) diabetes mellitus  (6) genetic testing for Lynch syndrome pending.  (7) status post hospitalization in July 2013 for stroke/small acute infarcts in the left sub insular white matter and coronal radiata.  (8) port in place  (9) left upper lobe 6 mm subpleural nodule, without associated hypermetabolic activity (likely well below the resolution of PET imaging) noted August 2013  (10) CTs obtained in [place] 2014 showed evidence of significant progression of disease with widespread intraperitoneal metastases and a moderate volume of presumably malignant ascites.  (11) currently being treated with Doxil on a Q. three-week basis, first dose given on 10/06/2012  (12) cholecystitis, with cholecystectomy pending  (13) recurring ascites, requiring therapeutic paracentesis, s/p placement of Aspira drain  (14) Apparent infiltration of TPN -- resolved (15) malnutrition, severe, on TPN  PLAN:   I discussed with the patient that unfortunately I can't get her to feel any better--she is going to feel weak, and will have nausea and occasional vomiting. The chemotherapy is having some response and it is being well tolerated, so the plan is to continue it and reassess in another 3 months. We will continue home TNA, as the patient cannot otherwise take in sufficient nutrition and fluids to survive. Nguyen is depressed about not feeling well and about the strain her illness causes to her family, however she "is not ready" to call it quits. Accordingly I have scheduled her to see [place] June 17 and we will resume therapy on that date.  Diet:  Small meals, preferably clear to full liquids  Activity:  As tolerated  Condition at Discharge:   improved  Signed: Dr. Ruthann Cancer [phone]  01/08/2013, 8:22 AM

## 2013-01-08 NOTE — Progress Notes (Signed)
Brought patient prayer shawl; offered support and explained services. Patient told me she does not belong to any faith community. She has two daughters who live nearby. She did not seem to want to talk today. She is mostly concerned with the medical needs she has as she is preparing to return home.

## 2013-01-08 NOTE — Progress Notes (Signed)
RT ordered Q4 Albuterol treatments for patient due to increased wheezing and SOB. RN aware.

## 2013-01-08 NOTE — Telephone Encounter (Signed)
Per staff phone call and POF I have schedueld appts.  JMW  

## 2013-01-08 NOTE — Telephone Encounter (Signed)
sw pt gv appt d/t for 01/19/13. i also made the pt aware that i will mail a letter/cal as well...td

## 2013-01-11 ENCOUNTER — Telehealth: Payer: Self-pay | Admitting: *Deleted

## 2013-01-11 NOTE — Telephone Encounter (Signed)
Received call from patient's daughter [name] [name] stating patient has rash on her hands and feet that started Wednesday of last week. Also talked with patient who states the rash is like little blisters that pop and then dry up and peel.  Per Dr. Darnelle Catalan probably due to her doxil but can see [name] Buzzard, NP tomorrow. Confirmed appt. For 01/12/13 at 130 for labs and 2pm with [name] [name].

## 2013-01-11 NOTE — Progress Notes (Signed)
Patient was evaluated and admitted my Dr. Darnelle Catalan on 01/07/2013.  Zollie Scale, PA-C

## 2013-01-12 ENCOUNTER — Other Ambulatory Visit (HOSPITAL_BASED_OUTPATIENT_CLINIC_OR_DEPARTMENT_OTHER): Payer: Medicare Other | Admitting: Lab

## 2013-01-12 ENCOUNTER — Other Ambulatory Visit: Payer: Self-pay | Admitting: Radiology

## 2013-01-12 ENCOUNTER — Ambulatory Visit (HOSPITAL_BASED_OUTPATIENT_CLINIC_OR_DEPARTMENT_OTHER): Payer: Medicare Other | Admitting: Family

## 2013-01-12 ENCOUNTER — Encounter: Payer: Self-pay | Admitting: Family

## 2013-01-12 VITALS — BP 133/73 | HR 106 | Temp 98.2°F | Resp 20 | Ht 64.0 in | Wt 124.5 lb

## 2013-01-12 DIAGNOSIS — K5900 Constipation, unspecified: Secondary | ICD-10-CM

## 2013-01-12 DIAGNOSIS — C569 Malignant neoplasm of unspecified ovary: Secondary | ICD-10-CM

## 2013-01-12 DIAGNOSIS — Z853 Personal history of malignant neoplasm of breast: Secondary | ICD-10-CM

## 2013-01-12 LAB — CBC WITH DIFFERENTIAL/PLATELET
Basophils Absolute: 0 10*3/uL (ref 0.0–0.1)
EOS%: 0.9 % (ref 0.0–7.0)
HCT: 29.5 % — ABNORMAL LOW (ref 34.8–46.6)
HGB: 9.4 g/dL — ABNORMAL LOW (ref 11.6–15.9)
MCH: 28.2 pg (ref 25.1–34.0)
NEUT%: 72.8 % (ref 38.4–76.8)
lymph#: 1.3 10*3/uL (ref 0.9–3.3)

## 2013-01-12 LAB — COMPREHENSIVE METABOLIC PANEL (CC13)
Albumin: 2.5 g/dL — ABNORMAL LOW (ref 3.5–5.0)
CO2: 28 mEq/L (ref 22–29)
Calcium: 9.6 mg/dL (ref 8.4–10.4)
Chloride: 101 mEq/L (ref 98–107)
Glucose: 149 mg/dl — ABNORMAL HIGH (ref 70–99)
Potassium: 4.5 mEq/L (ref 3.5–5.1)
Sodium: 140 mEq/L (ref 136–145)
Total Bilirubin: <0.2 mg/dL (ref 0.20–1.20)
Total Protein: 7.2 g/dL (ref 6.4–8.3)

## 2013-01-12 NOTE — Progress Notes (Addendum)
[name] [name]  [phone] [phone]  OFFICE PROGRESS NOTE    ID: [name] [name]   DOB: 03/23/1941  MR#: [id number]  JYN#:[id number]   PCP/GYN: Laurette Schimke, MD SU: Cyndia Bent, M.D. OTHER MD:   HISTORY OF PRESENT ILLNESS: [name] [name] is a 72 year-old [place] woman I followed remotely for a history of breast cancer. More recently she noted that her belly was getting "bigger". Gradually she has developed increasing low back pain. After a period of several months, as her symptoms increased, she brought this problem to her primary physician's attention and a KUB was obtained on 10/07/2011, which was unremarkable. This was followed by a CT scan 10/15/2011, which showed ascites and omental caking. Paracentesis 10/18/2011 showed ([id number]) malignant cells consistent with metastatic adenocarcinoma, with equivocal staining for estrogen receptor. The pattern of additional stains was most suggestive of a primary gynecologic tumor. CA 125 at this time was 2051.  The patient was referred to gynecologic oncology and on 10/29/2011 underwent exploratory laparotomy under Drs. Cleda Mccreedy and Antionette Char. This consisted of a total abdominal hysterectomy with bilateral salpingo-oophorectomy, omentectomy, and radical tumor debulking. Unfortunately there was significant tumor attached to the diaphragm so the patient was suboptimally debulked. Accordingly a peritoneal catheter was not placed. GYN-ONC recommended 6 cycles of carboplatin/paclitaxel given every 3 weeks, with Neulasta on day 2 for granulocyte support. [name] [name] subsequent history is as detailed below.  INTERVAL HISTORY:  Dr. 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  She is accompanied by her [name] [name] [name] for today's office visit. She was last by our office on 12/15/2012.  Since her last office visit, she was hospitalized from 01/06/2013 through 01/08/2013  with a partial small bowel obstruction.    She has been receiving chemotherapy consisting of Doxil being given every 3 weeks.  Her last chemotherapy (cycle 5) was on 12/29/2012.    Her interval history is remarkable for development of "red blisters on her hands and feet, shortness of breath (without exertion), and an itchy rash on her stomach that has appeared overnight".  [name] [name] and her [name] state that her drain for malignant ascites has been barely draining and she would like the drain to be removed as soon as possible.  The patient states that she has been having some loose and some formed bowel movements, but her bowel movements have been regular since being hospitalized for a partial small bowel obstruction.    REVIEW OF SYSTEMS:  [name] [name] denies any fevers or chills, but does have hot flashes. She notes skin changes (erythema) to the palms of her hands and soles of her feet in addition to what appears to be contact dermatitis in her right inguinal area, right lower quadrant of her abdomen and along taped area of occlusive bandage surrounding right chest accessed port.  She states her hands have pain when she tries to make a fist.   She's had no mouth ulcers or oral sensitivity. She denies any signs of abnormal bruising or bleeding. She has no significant cough, no phlegm production, no chest pain or palpitations. Her shortness of breath has returned and the patient states that it has been necessary for her wear her to wear supplemental oxygen.  O2 sats are 97% on 2 LPM of oxygen. She denies abnormal headaches or dizziness. She currently denies any unusual pain, and has had no peripheral neuropathy.  A detailed view of systems is otherwise stable and noncontributory.  PAST MEDICAL HISTORY: Past Medical History  Diagnosis Date  . History of breast cancer   . COPD (chronic obstructive pulmonary disease)   . GERD (gastroesophageal reflux disease)   . Diabetes mellitus     type II  . Hx  of colonic polyps   . Osteoarthritis   . MVP (mitral valve prolapse)   . Dysrhythmia     pt states runs a little fast   . Shortness of breath     with exertion   . Depression   . Anxiety   . Heart murmur   . Stroke   . Ascites 10/22/2012  . metastases dx'd 10/2011  . Ovarian cancer dx'd2/2013    active chemotherapy  . Breast CA 1993  . Partial small bowel obstruction 01/2013    PAST SURGICAL HISTORY: Past Surgical History  Procedure Laterality Date  . Cesarean section  1980, 1982    X 2   . Laparoscopy      work up for infertility  . Tympanoplasty      left X 2  . Left breast fibroadenoma removal  1960's  . Breast cyst aspirations    . Left modified radial mastectomy      >20 years ago  . Laparotomy  10/29/2011    Procedure: EXPLORATORY LAPAROTOMY;  Surgeon: Rejeana Brock A. Duard Brady, MD;  Location: [facility];  Service: Gynecology;  Laterality: N/A;  . Abdominal hysterectomy  10/29/2011    Procedure: HYSTERECTOMY ABDOMINAL;  Surgeon: Rejeana Brock A. Duard Brady, MD;  Location: [facility];  Service: Gynecology;  Laterality: N/A;  Total abdominal hysterectomy  . Salpingoophorectomy  10/29/2011    Procedure: SALPINGO OOPHERECTOMY;  Surgeon: Rejeana Brock A. Duard Brady, MD;  Location: [facility];  Service: Gynecology;  Laterality: Bilateral;    FAMILY HISTORY Family History  Problem Relation Age of Onset  . Cancer Mother     endometrial/ colon  . Cancer Brother     colon  . COPD Other   . Hypertension Other   . Cancer Maternal Uncle     prostate/bladder/lung  . Cancer Paternal Aunt     unknown abdominal cancer  . Cancer Maternal Grandmother     Salivary gland cancer  . Cancer Paternal Aunt     leukemia  The patient's father died at the age of 51. The patient's mother died at the age of 14; she had a history of colon cancer and endometrial cancer. The patient has no sisters. She has one brother, with a history of colon cancer. There is no history of breast cancer in the family. The patient is scheduled for genetic  testing later this month.   GYNECOLOGIC HISTORY: She had menarche age 89. First pregnancy to term was at age 80. She is GX P2. She became menopausal at the time of her chemotherapy for breast cancer. This was age 24. 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 Her husband [name] [name] runs a Psychologist, sport and exercise out of their own home. [name] [name], 74,  currently lives at home with the patient. There are some issues relating to this [name] that the patient discussed briefly. [name]. [name] [name], 30, is a former ED nurse at [employer]. The patient has no grandchildren. She is not a Advice worker.    ADVANCED DIRECTIVES: In place  HEALTH MAINTENANCE: History  Substance Use Topics  . Smoking status: Current Some Day Smoker -- 0.50 packs/day for 30 years    Types: Cigarettes  . Smokeless tobacco:  Never Used     Comment: quit again 2009; restarted 1 ppd 2011  . Alcohol Use: No     Colonoscopy: 2009  PAP: s/p hysterectomy  Bone density: 2010. "good"  Mammography: 2012 NOV--unremarkable  Allergies  Allergen Reactions  . Promethazine     "Makes me feel like I want to jump out of my skin"    Current Outpatient Prescriptions  Medication Sig Dispense Refill  . albuterol (PROVENTIL) (2.5 MG/3ML) 0.083% nebulizer solution Take 2.5 mg by nebulization every 6 (six) hours as needed for wheezing.      Marland Kitchen atenolol (TENORMIN) 25 MG tablet Take 12.5 mg by mouth 2 (two) times daily.      . budesonide-formoterol (SYMBICORT) 160-4.5 MCG/ACT inhaler Inhale 2 puffs into the lungs 2 (two) times daily.      Marland Kitchen buPROPion (WELLBUTRIN SR) 150 MG 12 hr tablet Take 1 tablet (150 mg total) by mouth 2 (two) times daily.  180 tablet  3  . busPIRone (BUSPAR) 15 MG tablet Take 1 tablet (15 mg total) by mouth 2 (two) times daily.  180 tablet  2  . clopidogrel (PLAVIX) 75 MG tablet Take 1 tablet (75 mg total) by mouth daily with breakfast.  60 tablet  3   . diazepam (VALIUM) 5 MG tablet Take 5-10 mg by mouth at bedtime as needed for sleep.      Marland Kitchen docusate sodium (COLACE) 100 MG capsule Take 100 mg by mouth 2 (two) times daily.      . Ipratropium-Albuterol (COMBIVENT RESPIMAT) 20-100 MCG/ACT AERS respimat Inhale 1 puff into the lungs every 6 (six) hours as needed for wheezing or shortness of breath.      . lovastatin (MEVACOR) 40 MG tablet Take 1 tablet (40 mg total) by mouth daily.  90 tablet  2  . omeprazole (PRILOSEC) 40 MG capsule Take 1 capsule (40 mg total) by mouth daily.  90 capsule  3  . ondansetron (ZOFRAN) 4 MG/2ML SOLN injection Inject 4 mg into the vein at bedtime. Pt gets through her port.      . ondansetron (ZOFRAN) 8 MG tablet Take 8 mg by mouth every 8 (eight) hours as needed for nausea.      . ONE TOUCH ULTRA TEST test strip USE AS DIRECTED  100 each  0  . oxyCODONE-acetaminophen (PERCOCET/ROXICET) 5-325 MG per tablet Take 1 tablet by mouth every 6 (six) hours as needed for pain.  100 tablet  0  . polyethylene glycol (MIRALAX / GLYCOLAX) packet Take 17 g by mouth daily.      Marland Kitchen PRESCRIPTION MEDICATION Inject into the vein every 21 ( twenty-one) days. Doxil infusion q3weeks      . PRESCRIPTION MEDICATION at bedtime. Home Paranatal Nutrition from [facility].      . prochlorperazine (COMPAZINE) 10 MG tablet Take 1 tablet (10 mg total) by mouth every 6 (six) hours as needed (Nausea or vomiting).  30 tablet  1  . ranitidine (ZANTAC) 150 MG tablet Take 1 tablet (150 mg total) by mouth 2 (two) times daily.  180 tablet  3  . sertraline (ZOLOFT) 100 MG tablet Take 1 tablet (100 mg total) by mouth daily before breakfast.  90 tablet  3  . [DISCONTINUED] busPIRone (BUSPAR) 15 MG tablet Take 1 tablet (15 mg total) by mouth 2 (two) times daily.  180 tablet  1  . [DISCONTINUED] roflumilast (DALIRESP) 500 MCG TABS tablet Take 500 mcg by mouth daily.  No current facility-administered medications for this visit.   OBJECTIVE: Older  white woman, frail,  who appears anxious and is examined in a wheelchair today  Filed Vitals:   01/12/13 1409  BP: 133/73  Pulse: 106  Temp: 98.2 F (36.8 C)  Resp: 20     Body mass index is 21.36 kg/(m^2).    ECOG FS: 2 Filed Weights   01/12/13 1409  Weight: 124 lb 8 oz (56.473 kg)    Eyes: Sclerae unicteric Mouth: Oropharynx clear Lungs: Diminished breath sounds bilaterally in the bases, No wheezes or rhonchi Heart: Regular rhythm, slight tachycardia, right chest accessed Port-A-Cath Abdomen: Soft, non tender, non distended, positive bowel sounds, Aspira drain is in the lower right abdomen. M/S:  No peripheral edema Neuro: Nonfocal, well oriented, anxious affect Breasts: Deferred Skin: What appears to be contact dermatitis in right inguinal area, right lower quadrant abdominal area, and around accessed right chest Port-A-Cath area, skin is moist skin in these areas and also excoriated/erythemic, palms of hands and soles of feet are erythemic, the patient has the beginnings of a right hip stage I pressure ulcer.   LAB RESULTS: Lab Results  Component Value Date   WBC 8.0 01/12/2013   NEUTROABS 5.8 01/12/2013   HGB 9.4* 01/12/2013   HCT 29.5* 01/12/2013   MCV 88.6 01/12/2013   PLT 464* 01/12/2013      Chemistry      Component Value Date/Time   NA 140 01/12/2013 1353   NA 134* 01/08/2013 0427   K 4.5 01/12/2013 1353   K 3.8 01/08/2013 0427   CL 101 01/12/2013 1353   CL 98 01/08/2013 0427   CO2 28 01/12/2013 1353   CO2 28 01/08/2013 0427   BUN 24.1 01/12/2013 1353   BUN 18 01/08/2013 0427   CREATININE 0.6 01/12/2013 1353   CREATININE 0.45* 01/08/2013 0427      Component Value Date/Time   CALCIUM 9.6 01/12/2013 1353   CALCIUM 9.1 01/08/2013 0427   ALKPHOS 124 01/12/2013 1353   ALKPHOS 112 01/07/2013 0407   AST 16 01/12/2013 1353   AST 12 01/07/2013 0407   ALT 16 01/12/2013 1353   ALT 12 01/07/2013 0407   BILITOT <0.20 Repeated and Verified 01/12/2013 1353   BILITOT 0.1* 01/07/2013 0407        STUDIES: Dg Abd 1 View 01/07/2013   *RADIOLOGY REPORT*  Clinical Data: 72 year old female lower abdominal pain.  Possible bowel obstruction.  ABDOMEN - 1 VIEW  Comparison: 01/06/2013 and earlier.  Findings: AP portable supine view 0731 hours.  Percutaneous catheter again projects through the lower abdomen and pelvis. Interval improved bowel gas pattern with decreased gaseous distention of small bowel.  Ascending and descending colon gas plus gas in the rectum remain visible. Stable visualized osseous structures.  Probable small pleural effusions. No definite pneumoperitoneum.  IMPRESSION: 1.  Interval improved bowel gas pattern, nonobstructed. 2.  Small pleural effusions suspected. 3.  Lower abdominal/pelvic catheter remains in place.   Original Report Authenticated By: Erskine Speed, M.D.   Ct Abdomen Pelvis W Contrast 01/07/2013   *RADIOLOGY REPORT*  Clinical Data: Abdominal pain, distension, nausea, weight loss, constipation, metastatic ovarian cancer  CT ABDOMEN AND PELVIS WITH CONTRAST  Technique:  Multidetector CT imaging of the abdomen and pelvis was performed following the standard protocol during bolus administration of intravenous contrast.  Contrast: OMNIPAQUE IOHEXOL 300 MG/ML  SOLN  Comparison: 11/02/2012  Findings: Moderate bilateral pleural effusions, new.  Liver, spleen, pancreas, and adrenal glands  are within normal limits.  Distended gallbladder, although improved.  No intrahepatic or extrahepatic ductal dilatation.  4 mm nonobstructing left lower pole renal calculus (series 2/image 32).  5 mm right lower pole renal cyst.  No hydronephrosis.  Small volume abdominal ascites, similar.  Peritoneal drainage catheter terminates in the left mid abdomen (series 2/image 54).  3.8 x 6.0 cm fluid collection posterior to the stomach (series 2/image 25), now with a developing thin rim.  Peritoneal carcinomatosis, mildly improved.  For example, omental caking measures 4.0 x 10.2 cm beneath the left  anterior abdominal wall (series 2/image 34), previously 5.2 x 10.9 cm.  A 3.6 x 3.5 cm gastrohepatic ligament implant (series 2/image 26) previously measured 4.0 x 3.7 cm.  Additional area of tumor in the lower pelvis is difficult to discretely measure.  No evidence of bowel obstruction. While mildly prominent loops of small bowel are present in the left lower abdomen, contrast opacifies the proximal colon.  Atherosclerotic calcifications of the abdominal aorta and branch vessels.  Bladder is within normal limits.  Degenerative changes of the visualized thoracolumbar spine.  IMPRESSION: Peritoneal carcinomatosis, mildly improved.  Small volume abdominal ascites with peritoneal drainage catheter, grossly unchanged.  Developing loculated 3.8 x 6.0 cm fluid collection posterior to the stomach.  Moderate bilateral pleural effusions, new.  No evidence of bowel obstruction.  Additional stable ancillary findings as above.   Original Report Authenticated By: Charline Bills, M.D.     ASSESSMENT: 72 y.o.  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  (2) Status post TAH BSO, omentectomy, and suboptimal debulking in 10/2011 for a high-grade serous adenocarcinoma of the ovary, pT3c NX (Stage IIIC) , s/p eight cycles of chemotherapy consisting Carboplatin/Paclitaxel completed 05/05/2012 (initial CA125 of 2050.7 normalized after cycle 5).  (3) Recurrent disease documented by rising CEA 125 an abdominal CT scan 09/21/2012.  (4) COPD (emphysema) with ongoing tobacco  abuse.  (5) Diabetes mellitus  (6) Genetic testing for Lynch syndrome pending.  (7)  Status post hospitalization in 02/2012 for stroke/small acute infarcts in the left sub insular white matter and coronal radiata.  (8) Right chest Port-A-Cath  (9) Left upper lobe 6 mm subpleural nodule, without associated hypermetabolic activity (likely well below the resolution of PET imaging) noted 03/2012.  (10) CTs obtained in 09/2012 showed evidence of significant progression of disease with widespread intraperitoneal metastases and a moderate volume of presumably malignant ascites.  (11) Recently treated with chemotherapy consisting of Doxil on a Qthree-week basis, from 10/06/2012 - 12/29/2012 x 5 cycles.  (12) Cholecystitis, with cholecystectomy pending  (13)  Malignant ascites, requiring therapeutic paracentesis, right lower quadrant Aspira drain placed on 10/29/2012.  (14)  Apparent infiltration of TPN noted during office visit on 12/15/2012.  Severe malnutrition.  (15)   Hospitalized from 01/06/2013 through 01/08/2013 for partial small bowel obstruction.  PLAN:  This case was reviewed with Dr. Darnelle Catalan who also spoke with the patient and her [name] today.  With regards to her ovarian cancer, Dr. Darnelle Catalan is considering changing her chemotherapy to Carboplatin weekly or every other week which is scheduled to start on 01/19/2013 but  will be postponed a week if patient continues to have hand/foot symptoms.  Dr. Darnelle Catalan also believes the patient's current skin issues will also resolve by eliminating Doxil chemotherapy.  [name] [name] is currently receiving TNA at home for malnutrition and reports that her bowel movements are regular.  Her combination of anti-emetics (Zofran and Compazine), PPI/H2 blockers (Ranitidine and Omeprazole) in addition to the use of MiraLAX and stool softeners (Colace) has aided in her nutritional/bowel recovery.  Dr. Darnelle Catalan is working to coordinate the removal of the  patient's Aspira drain at her and her [name]'s request.  The patient must be off Plavix for a number of days per interventional radiology before proceeding with the removal of the drain.  Dr. Darnelle Catalan explained the patient's shortness of breath is ongoing and due to the patient's emphysema and pleural effusions.  Smoking cessation was discussed with the patient.  We plan to see the patient in one week at which time we will check a CBC and CMP and start chemotherapy with Carboplatin unless the patient's hand/foot symptoms persist.  All questions answered.  The patient and her [name] are encouraged to contact us in the interim with any questions, concerns, or problems.   [name] Bras NP-C 01/13/2013 10:56 AM    ADDENDUM: Diahn's situation is complex and I discussed that at length today with her and her [name]. She is having some response to the Doxil, but it is causing her severe vulvar blistering. I do not think she will tolerate much more of this therapy. On the plus side, her partial small bowel obstruction appears to be improving. The patient is clearly desirous of continuing therapy and does not want to switch to a palliative/Comfort Care approach at this time.  Accordingly we are going to change her treatment to carboplatin. We discussed the possible toxicities, side effects and complications of this agent in detail. The patient really has an appointment to see [place] next week, but if she is not ready for treatment at that point she will postpone it for one week. In any case the plan will be to discontinue Doxil and start carboplatin. I am hoping that we can get the patient into a better response with this agent. We will continue the TNA on total her SBO symptoms have completely resolved   I personally saw this patient and performed a substantive portion of this encounter with the listed APP documented above.   Lowella Dell, MD

## 2013-01-12 NOTE — Progress Notes (Signed)
ID: [name] [name]   DOB: 06-Dec-1940  MR#: [id number]  CSN#:[id number]  PCP:  GYN: Laurette Schimke, MD SU: [name] OTHER MD:  HISTORY OF PRESENT ILLNESS: [name] [name] is a 72 year-old [place] woman I followed remotely for a history of breast cancer. More recently she noted that her belly was getting "bigger". Gradually she has developed increasing low back pain. After a period of several months, as her symptoms increased, she brought this problem to her primary physician's attention and a KUB was obtained on 10/07/2011, which was unremarkable. This was followed by a CT scan 10/15/2011, which showed ascites and omental caking. Paracentesis 10/18/2011 showed ([id number]) malignant cells consistent with metastatic adenocarcinoma, with equivocal staining for estrogen receptor. The pattern of additional stains was most suggestive of a primary gynecologic tumor. CA 125 at this time was 2051.  The patient was referred to gynecologic oncology and on 10/29/2011 underwent exploratory laparotomy under Drs. Cleda Mccreedy and Antionette Char. This consisted of a total abdominal hysterectomy with bilateral salpingo-oophorectomy, omentectomy, and radical tumor debulking. Unfortunately there was significant tumor attached to the diaphragm so the patient was suboptimally debulked. Accordingly a peritoneal catheter was not placed. GYN-ONC recommended 6 cycles of carboplatin/paclitaxel given every 3 weeks, with Neulasta on day 2 for granulocyte support. [name] [name]'s subsequent history is as detailed below.  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 She is currently day 8 cycle 4 of Doxil being given every 3 weeks, which she is tolerating remarkably well.   Interval history is remarkable for [name] [name] getting up during the night last night, pulling the TPN line too far, and waking up this morning  with swelling, redness, and pain in the right  breast, just beneath the port site. It has become increasingly tender to touch throughout the morning.    With regard to her ovarian cancer and her chemotherapy, [name] [name] is actually feeling well. She has been eating reasonably well with no significant nausea and no emesis. She does continue, however, have constipation. She had a very small bowel movement last night. The stool is hard, but she denies any bleeding. A KUB last week was unremarkable, and she is scheduled for a CT of the abdomen and pelvis tomorrow.   REVIEW OF SYSTEMS:  [name] [name] has had no fevers or chills, but does have hot flashes. She notes no skin changes, rashes, or evidence of  PPE.  She's had no mouth ulcers or oral sensitivity. She denies any signs of abnormal bruising or bleeding. She has no significant cough, no phlegm production, no chest pain or palpitations. Her shortness of breath has improved since receiving a blood transfusion last week on May 7. Her energy level has improved as well. She denies abnormal headaches or dizziness. She currently denies any unusual pain, and has had no peripheral neuropathy.  A detailed view of systems is otherwise stable and noncontributory.   PAST MEDICAL HISTORY: Past Medical History  Diagnosis Date  . History of breast cancer   . COPD (chronic obstructive pulmonary disease)   . GERD (gastroesophageal reflux disease)   . Diabetes mellitus     type II  . Hx of colonic polyps   . Osteoarthritis   . MVP (mitral valve prolapse)   . Dysrhythmia     pt states runs a little fast   . Shortness of breath     with exertion   . Depression   . Anxiety   . Heart  murmur   . Stroke   . Ascites 10/22/2012  . metastases dx'd 10/2011  . Ovarian cancer dx'd2/2013    active chemotherapy  . Breast CA 1993    PAST SURGICAL HISTORY: Past Surgical History  Procedure Laterality Date  . Cesarean section  1980, 1982    X 2   . Laparoscopy      work up for infertility  . Tympanoplasty       left X 2  . Left breast fibroadenoma removal  1960's  . Breast cyst aspirations    . Left modified radial mastectomy      >20 years ago  . Laparotomy  10/29/2011    Procedure: EXPLORATORY LAPAROTOMY;  Surgeon: Rejeana Brock A. Duard Brady, MD;  Location: [facility];  Service: Gynecology;  Laterality: N/A;  . Abdominal hysterectomy  10/29/2011    Procedure: HYSTERECTOMY ABDOMINAL;  Surgeon: Rejeana Brock A. Duard Brady, MD;  Location: [facility];  Service: Gynecology;  Laterality: N/A;  Total abdominal hysterectomy  . Salpingoophorectomy  10/29/2011    Procedure: SALPINGO OOPHERECTOMY;  Surgeon: Rejeana Brock A. Duard Brady, MD;  Location: [facility];  Service: Gynecology;  Laterality: Bilateral;    FAMILY HISTORY Family History  Problem Relation Age of Onset  . Cancer Mother     endometrial/ colon  . Cancer Brother     colon  . COPD Other   . Hypertension Other   . Cancer Maternal Uncle     prostate/bladder/lung  . Cancer Paternal Aunt     unknown abdominal cancer  . Cancer Maternal Grandmother     Salivary gland cancer  . Cancer Paternal Aunt     leukemia   the patient's father died at the age of 41. The patient's mother died at the age of 23; she had a history of colon cancer and endometrial cancer. The patient has no sisters. She has one brother, with a history of colon cancer. There is no history of breast cancer in the family. The patient is scheduled for genetic testing later this month.  GYNECOLOGIC HISTORY: She had menarche age 18. First pregnancy to term was at age 83. She is GX P2. She became menopausal at the time of her chemotherapy for breast cancer. This was age 57. 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 Her husband [name] [name] runs a Psych[employer], sport and exercise out of their own home. Daughter [name] [name], 71,  currently lives at home with the patient. There are some issues relating to this daughter that the patient discussed briefly. [place]. 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 The patient has no grandchildren. She is not a Advice worker.   ADVANCED DIRECTIVES: in place  HEALTH MAINTENANCE: History  Substance Use Topics  . Smoking status: Current Some Day Smoker -- 0.50 packs/day for 30 years    Types: Cigarettes  . Smokeless tobacco: Never Used     Comment: quit again 2009; restarted 1 ppd 2011  . Alcohol Use: No     Colonoscopy: 2009  PAP: s/p hysterectomy  Bone density: 2010. "good"  Mammography: 2012 NOV--unremarkable  Allergies  Allergen Reactions  . Promethazine     "Makes me feel like I want to jump out of my skin"    Current Outpatient Prescriptions  Medication Sig Dispense Refill  . albuterol (PROVENTIL) (2.5 MG/3ML) 0.083% nebulizer solution Take 2.5 mg by nebulization every 6 (six) hours as needed for wheezing.      Marland Kitchen atenolol (TENORMIN) 25 MG tablet  Take 12.5 mg by mouth 2 (two) times daily.      . budesonide-formoterol (SYMBICORT) 160-4.5 MCG/ACT inhaler Inhale 2 puffs into the lungs 2 (two) times daily.      Marland Kitchen buPROPion (WELLBUTRIN SR) 150 MG 12 hr tablet Take 1 tablet (150 mg total) by mouth 2 (two) times daily.  180 tablet  3  . busPIRone (BUSPAR) 15 MG tablet Take 1 tablet (15 mg total) by mouth 2 (two) times daily.  180 tablet  2  . clopidogrel (PLAVIX) 75 MG tablet Take 1 tablet (75 mg total) by mouth daily with breakfast.  60 tablet  3  . diazepam (VALIUM) 5 MG tablet Take 5-10 mg by mouth at bedtime as needed for sleep.      . Ipratropium-Albuterol (COMBIVENT RESPIMAT) 20-100 MCG/ACT AERS respimat Inhale 1 puff into the lungs every 6 (six) hours as needed for wheezing or shortness of breath.      . lovastatin (MEVACOR) 40 MG tablet Take 1 tablet (40 mg total) by mouth daily.  90 tablet  2  . omeprazole (PRILOSEC) 40 MG capsule Take 1 capsule (40 mg total) by mouth daily.  90 capsule  3  . ondansetron (ZOFRAN) 4 MG/2ML SOLN injection Inject 4 mg into the vein at bedtime. Pt gets through her port.      .  ondansetron (ZOFRAN) 8 MG tablet Take 8 mg by mouth every 8 (eight) hours as needed for nausea.      . ONE TOUCH ULTRA TEST test strip USE AS DIRECTED  100 each  0  . oxyCODONE-acetaminophen (PERCOCET/ROXICET) 5-325 MG per tablet Take 1 tablet by mouth every 6 (six) hours as needed for pain.  100 tablet  0  . PRESCRIPTION MEDICATION Inject into the vein every 21 ( twenty-one) days. Doxil infusion q3weeks      . PRESCRIPTION MEDICATION at bedtime. Home Paranatal Nutrition from [facility].      . prochlorperazine (COMPAZINE) 10 MG tablet Take 1 tablet (10 mg total) by mouth every 6 (six) hours as needed (Nausea or vomiting).  30 tablet  1  . ranitidine (ZANTAC) 150 MG tablet Take 1 tablet (150 mg total) by mouth 2 (two) times daily.  180 tablet  3  . sertraline (ZOLOFT) 100 MG tablet Take 1 tablet (100 mg total) by mouth daily before breakfast.  90 tablet  3  . [DISCONTINUED] busPIRone (BUSPAR) 15 MG tablet Take 1 tablet (15 mg total) by mouth 2 (two) times daily.  180 tablet  1  . [DISCONTINUED] roflumilast (DALIRESP) 500 MCG TABS tablet Take 500 mcg by mouth daily.         No current facility-administered medications for this visit.   OBJECTIVE: Older white woman who appears anxious and is examined in a chair today  There were no vitals filed for this visit.   There is no weight on file to calculate BMI.    ECOG FS: 2 There were no vitals filed for this visit. Sclerae unicteric Oropharynx clear,  Lungs diminished breath sounds bilaterally in the bases, No wheezes or rhonchi Heart regular rate and rhythm Abdomen soft, nontender, nondistended, positive bowel sounds, Aspira drain is in the lower right abdomen. MSK  no peripheral edema Neuro: nonfocal, well oriented, anxious affect Breasts: Port is intact in the right upper chest wall, with right breast is enlarged, erythematous, blanching, when to touch, and very tender to gentle palpation. There is no evidence of draining or bleeding  from the port site,  and no swelling around the port site itself. Fullness is also palpated along the lateral portion of the breast extending to the axilla.  Patient status post left mastectomy.   LAB RESULTS: Lab Results  Component Value Date   WBC 7.0 01/07/2013   NEUTROABS 4.8 01/07/2013   HGB 8.5* 01/07/2013   HCT 26.7* 01/07/2013   MCV 87.3 01/07/2013   PLT 432* 01/07/2013      Chemistry      Component Value Date/Time   NA 134* 01/08/2013 0427   NA 139 12/29/2012 1036   K 3.8 01/08/2013 0427   K 4.3 12/29/2012 1036   CL 98 01/08/2013 0427   CL 102 12/29/2012 1036   CO2 28 01/08/2013 0427   CO2 26 12/29/2012 1036   BUN 18 01/08/2013 0427   BUN 27.5* 12/29/2012 1036   CREATININE 0.45* 01/08/2013 0427   CREATININE 0.6 12/29/2012 1036      Component Value Date/Time   CALCIUM 9.1 01/08/2013 0427   CALCIUM 9.0 12/29/2012 1036   ALKPHOS 112 01/07/2013 0407   ALKPHOS 117 12/29/2012 1036   AST 12 01/07/2013 0407   AST 18 12/29/2012 1036   ALT 12 01/07/2013 0407   ALT 20 12/29/2012 1036   BILITOT 0.1* 01/07/2013 0407   BILITOT <0.20 Repeated and Verified 12/29/2012 1036        STUDIES:  Dg Abd 1 View  12/09/2012  *RADIOLOGY REPORT*  Clinical Data: Abdominal pain.  Ovarian cancer  ABDOMEN - 1 VIEW  Comparison: 11/07/2012  Findings: Normal bowel gas pattern.  Negative for bowel obstruction.  Peritoneal catheter overlying the upper pelvis is unchanged.  Degenerative changes at L2-3 with lateral subluxation is unchanged. No acute bony lesion.  IMPRESSION: No acute abnormality.   Original Report Authenticated By: Janeece Riggers, M.D.      ASSESSMENT: 72 y.o.  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 [V323G (1196T>G)]    (1) status post left modified radical mastectomy October 1993 for a T2 N0 invasive ductal breast, estrogen and progesterone receptor positive cancer treated adjuvantly with MFL (methotrexate/fluorouracil/leucovorin) chemotherapy x6, completed April 1994,  followed by 5 years of tamoxifen completed April 1999, with no evidence of recurrence  (2) now status post TAH BSO, omentectomy, and suboptimal debulking 10/29/2011 for a high-grade serous adenocarcinoma of the ovary, pT3c NX (Stage IIIC) , s/p eight cycles of carboplatin/paclitaxel completed 05/05/2012 (initial CA125 of 2050.7 normalized after cycle 5)  (3) recurrent disease documented by rising CEA 125 an abdominal CT scan 09/21/2012.  (4) COPD with ongoing tobacco abuse  (5) diabetes mellitus  (6) genetic testing for Lynch syndrome pending.  (7)  status post hospitalization in July 2013 for stroke/small acute infarcts in the left sub insular white matter and coronal radiata.  (8) port in place  (9) left upper lobe 6 mm subpleural nodule, without associated hypermetabolic activity (likely well below the resolution of PET imaging) noted August 2013  (10) CTs obtained in [place] 2014 showed evidence of significant progression of disease with widespread intraperitoneal metastases and a moderate volume of presumably malignant ascites.  (11) currently being treated with Doxil on a Q. three-week basis, first dose given on 10/06/2012  (12) cholecystitis, with cholecystectomy pending  (13)  recurring ascites, requiring therapeutic paracentesis, and pending placement of Aspira drain  (14) Apparent infiltration of TPN   PLAN:  This case was reviewed with Dr. Darnelle Catalan who also spoke with the patient today.  With regards to her  ovarian cancer, Anye continues to tolerate the Doxil well, and is scheduled for a restaging CT tomorrow. She is scheduled to see Dr. Darnelle Catalan next week to review those results and review her treatment plan.  Her biggest concern today, however, is the apparent infiltrate of TPN. She has been referred to the emergency room for a consult with plastic surgery for possible I&D.   Larina Bras    01/12/2013

## 2013-01-13 ENCOUNTER — Other Ambulatory Visit: Payer: Self-pay | Admitting: Radiology

## 2013-01-13 ENCOUNTER — Inpatient Hospital Stay (HOSPITAL_COMMUNITY): Admission: RE | Admit: 2013-01-13 | Payer: Medicare Other | Source: Ambulatory Visit

## 2013-01-13 ENCOUNTER — Telehealth: Payer: Self-pay | Admitting: Dietician

## 2013-01-13 DIAGNOSIS — R188 Other ascites: Secondary | ICD-10-CM

## 2013-01-13 DIAGNOSIS — C569 Malignant neoplasm of unspecified ovary: Secondary | ICD-10-CM

## 2013-01-14 ENCOUNTER — Other Ambulatory Visit: Payer: Self-pay | Admitting: Radiology

## 2013-01-15 ENCOUNTER — Inpatient Hospital Stay (HOSPITAL_COMMUNITY): Admission: RE | Admit: 2013-01-15 | Payer: Medicare Other | Source: Ambulatory Visit

## 2013-01-15 ENCOUNTER — Ambulatory Visit (HOSPITAL_COMMUNITY)
Admission: RE | Admit: 2013-01-15 | Discharge: 2013-01-15 | Disposition: A | Discharge status: Home / Self Care | Payer: Medicare Other | Source: Ambulatory Visit | Attending: Radiology | Admitting: Radiology

## 2013-01-15 ENCOUNTER — Ambulatory Visit (HOSPITAL_COMMUNITY): Admission: RE | Admit: 2013-01-15 | Payer: Medicare Other | Source: Ambulatory Visit

## 2013-01-15 DIAGNOSIS — C569 Malignant neoplasm of unspecified ovary: Secondary | ICD-10-CM | POA: Insufficient documentation

## 2013-01-15 DIAGNOSIS — Z4682 Encounter for fitting and adjustment of non-vascular catheter: Secondary | ICD-10-CM | POA: Insufficient documentation

## 2013-01-15 DIAGNOSIS — R180 Malignant ascites: Secondary | ICD-10-CM | POA: Insufficient documentation

## 2013-01-15 DIAGNOSIS — R188 Other ascites: Secondary | ICD-10-CM

## 2013-01-15 MED ORDER — LIDOCAINE HCL 1 % IJ SOLN
INTRAMUSCULAR | Status: AC
  Filled 2013-01-15: qty 20

## 2013-01-15 NOTE — Procedures (Signed)
Successful RLQ PERITONEAL DRAIN REMOVAL NO COMP STABLE FULL REPORT IN PACS

## 2013-01-18 ENCOUNTER — Other Ambulatory Visit: Payer: Self-pay | Admitting: Physician Assistant

## 2013-01-18 ENCOUNTER — Other Ambulatory Visit: Payer: Self-pay | Admitting: Internal Medicine

## 2013-01-18 ENCOUNTER — Telehealth: Payer: Self-pay | Admitting: Oncology

## 2013-01-18 NOTE — Telephone Encounter (Signed)
Please advise refill? 

## 2013-01-18 NOTE — Telephone Encounter (Signed)
I am not her PCP.  Another provider must be Rx the Xanax

## 2013-01-19 ENCOUNTER — Telehealth: Payer: Self-pay | Admitting: Oncology

## 2013-01-19 ENCOUNTER — Ambulatory Visit (HOSPITAL_BASED_OUTPATIENT_CLINIC_OR_DEPARTMENT_OTHER): Payer: Medicare Other | Admitting: Physician Assistant

## 2013-01-19 ENCOUNTER — Ambulatory Visit: Payer: Medicare Other

## 2013-01-19 ENCOUNTER — Other Ambulatory Visit (HOSPITAL_BASED_OUTPATIENT_CLINIC_OR_DEPARTMENT_OTHER): Payer: Medicare Other | Admitting: Lab

## 2013-01-19 ENCOUNTER — Telehealth: Payer: Self-pay | Admitting: *Deleted

## 2013-01-19 ENCOUNTER — Encounter: Payer: Self-pay | Admitting: Oncology

## 2013-01-19 ENCOUNTER — Ambulatory Visit: Payer: Medicare Other | Admitting: Family

## 2013-01-19 ENCOUNTER — Encounter: Payer: Self-pay | Admitting: Physician Assistant

## 2013-01-19 VITALS — BP 110/65 | HR 92 | Temp 97.7°F | Resp 20 | Ht 64.0 in | Wt 123.1 lb

## 2013-01-19 DIAGNOSIS — Z853 Personal history of malignant neoplasm of breast: Secondary | ICD-10-CM

## 2013-01-19 DIAGNOSIS — R180 Malignant ascites: Secondary | ICD-10-CM

## 2013-01-19 DIAGNOSIS — C569 Malignant neoplasm of unspecified ovary: Secondary | ICD-10-CM

## 2013-01-19 DIAGNOSIS — K5900 Constipation, unspecified: Secondary | ICD-10-CM

## 2013-01-19 DIAGNOSIS — D702 Other drug-induced agranulocytosis: Secondary | ICD-10-CM

## 2013-01-19 DIAGNOSIS — F411 Generalized anxiety disorder: Secondary | ICD-10-CM

## 2013-01-19 DIAGNOSIS — F419 Anxiety disorder, unspecified: Secondary | ICD-10-CM | POA: Insufficient documentation

## 2013-01-19 LAB — CBC WITH DIFFERENTIAL/PLATELET
Basophils Absolute: 0 10*3/uL (ref 0.0–0.1)
EOS%: 1.6 % (ref 0.0–7.0)
Eosinophils Absolute: 0.1 10*3/uL (ref 0.0–0.5)
HCT: 30.6 % — ABNORMAL LOW (ref 34.8–46.6)
HGB: 9.8 g/dL — ABNORMAL LOW (ref 11.6–15.9)
MCH: 28.3 pg (ref 25.1–34.0)
MCV: 88.4 fL (ref 79.5–101.0)
NEUT#: 0.5 10*3/uL — CL (ref 1.5–6.5)
NEUT%: 10.1 % — ABNORMAL LOW (ref 38.4–76.8)
RDW: 17.4 % — ABNORMAL HIGH (ref 11.2–14.5)
lymph#: 2.7 10*3/uL (ref 0.9–3.3)

## 2013-01-19 MED ORDER — CIPROFLOXACIN HCL 500 MG PO TABS: 500.0000 mg | ORAL_TABLET | Freq: Two times a day (BID) | ORAL | Status: DC

## 2013-01-19 MED ORDER — ALPRAZOLAM 0.5 MG PO TABS: 0.5000 mg | ORAL_TABLET | Freq: Two times a day (BID) | ORAL | Status: DC | PRN

## 2013-01-19 NOTE — Progress Notes (Signed)
ID: [name] [name]   DOB: 1941/04/23  MR#: [id number]  CSN#:[id number]  PCP:  GYN: [name] Schimke, MD SU: [name] OTHER MD:  HISTORY OF PRESENT ILLNESS: [name] [name] is a 72 year-old [place] woman I followed remotely for a history of breast cancer. More recently she noted that her belly was getting "bigger". Gradually she has developed increasing low back pain. After a period of several months, as her symptoms increased, she brought this problem to her primary physician's attention and a KUB was obtained on 10/07/2011, which was unremarkable. This was followed by a CT scan 10/15/2011, which showed ascites and omental caking. Paracentesis 10/18/2011 showed ([id number]) malignant cells consistent with metastatic adenocarcinoma, with equivocal staining for estrogen receptor. The pattern of additional stains was most suggestive of a primary gynecologic tumor. CA 125 at this time was 2051.  The patient was referred to gynecologic oncology and on 10/29/2011 underwent exploratory laparotomy under Drs. [name] [name] and [name] [name]. This consisted of a total abdominal hysterectomy with bilateral salpingo-oophorectomy, omentectomy, and radical tumor debulking. Unfortunately there was significant tumor attached to the diaphragm so the patient was suboptimally debulked. Accordingly a peritoneal catheter was not placed. GYN-ONC recommended 6 cycles of carboplatin/paclitaxel given every 3 weeks, with Neulasta on day 2 for granulocyte support. [name] [name]'s subsequent history is as detailed below.  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 She was previously receiving q. three-week Doxil, but this has now been discontinued secondary to intolerance, primarily with hand/foot syndrome and a blistering rash in the lower abdomen. She is ready to initiate treatment with carboplatin, to be given every 3 weeks, with first dose  scheduled today.   Unfortunately, she was found to be neutropenic with an ANC of 0.5 today.  fortunately, [name] [name] denies any fevers or chills. In fact overall she tells me she is actually feeling much better. Her energy level has improved, although she does rest frequently and becomes tired and short of breath with extensive activity. She is now able to perform all of her  ADL's on her own.   She is drinking and eating small meals, but continues on TPN. They are getting ready to add a lipid bag to be given nightly over 12 hours. This is followed by [facility].  She continues to have some constipation but manages to have bowel movements at least every 2-3 days with the use of laxatives. She denies any significant abdominal or pelvic pain, and has had no nausea or emesis.   REVIEW OF SYSTEMS:  [name] [name]'s rash is resolving nicely, and she denies any additional skin changes. She is completing a prednisone taper. She denies any signs of abnormal bruising or bleeding.  She has no significant cough, no phlegm production, no chest pain or palpitations.  She denies abnormal headaches, change in vision  or  increased dizziness. She currently denies any unusual pain, and has had no peripheral neuropathy  or edema.  A detailed view of systems is otherwise stable and noncontributory.   PAST MEDICAL HISTORY: Past Medical History  Diagnosis Date  . History of breast cancer   . COPD (chronic obstructive pulmonary disease)   . GERD (gastroesophageal reflux disease)   . Diabetes mellitus     type II  . Hx of colonic polyps   . Osteoarthritis   . MVP (mitral valve prolapse)   . Dysrhythmia     pt states runs a little fast   .  Shortness of breath     with exertion   . Depression   . Anxiety   . Heart murmur   . Stroke   . Ascites 10/22/2012  . metastases dx'd 10/2011  . Ovarian cancer dx'd2/2013    active chemotherapy  . Breast CA 1993  . Partial small bowel obstruction 01/2013    PAST  SURGICAL HISTORY: Past Surgical History  Procedure Laterality Date  . Cesarean section  1980, 1982    X 2   . Laparoscopy      work up for infertility  . Tympanoplasty      left X 2  . Left breast fibroadenoma removal  1960's  . Breast cyst aspirations    . Left modified radial mastectomy      >20 years ago  . Laparotomy  10/29/2011    Procedure: EXPLORATORY LAPAROTOMY;  Surgeon: [name] Brock A. Duard Brady, MD;  Location: [facility];  Service: Gynecology;  Laterality: N/A;  . Abdominal hysterectomy  10/29/2011    Procedure: HYSTERECTOMY ABDOMINAL;  Surgeon: [name] Brock A. Duard Brady, MD;  Location: [facility];  Service: Gynecology;  Laterality: N/A;  Total abdominal hysterectomy  . Salpingoophorectomy  10/29/2011    Procedure: SALPINGO OOPHERECTOMY;  Surgeon: [name] Brock A. Duard Brady, MD;  Location: [facility];  Service: Gynecology;  Laterality: Bilateral;    FAMILY HISTORY Family History  Problem Relation Age of Onset  . Cancer [name] [name]     endometrial/ colon  . Cancer [name] [name]     colon  . COPD Other   . Hypertension Other   . Cancer [name] [name]     prostate/bladder/lung  . Cancer [name] [name]     unknown abdominal cancer  . Cancer [name] [name]     Salivary gland cancer  . Cancer [name] [name]     leukemia   the patient's [name] [name] died at the age of 44. The patient's [name] [name] died at the age of 28; she had a history of colon cancer and endometrial cancer. The patient has no sisters. She has one [name] [name], with a history of colon cancer. There is no history of breast cancer in the family. The patient is scheduled for genetic testing later this month.  GYNECOLOGIC HISTORY: She had menarche age 18. First pregnancy to term was at age 43. She is GX P2. She became menopausal at the time of her chemotherapy for breast cancer. This was age 25. 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 Her husband [name] [name] runs a Psych[employer], sport and exercise out of their own home.  [name] [name] [name] [name], 80,  currently lives at home with the patient. There are some issues relating to this [name] [name] that the patient discussed briefly. [place]. [name] [name], 30, is an ED nurse at [employer]. The patient has no grandchildren. She is not a Advice worker.   ADVANCED DIRECTIVES: in place  HEALTH MAINTENANCE: History  Substance Use Topics  . Smoking status: Current Some Day Smoker -- 0.50 packs/day for 30 years    Types: Cigarettes  . Smokeless tobacco: Never Used     Comment: quit again 2009; restarted 1 ppd 2011  . Alcohol Use: No     Colonoscopy: 2009  PAP: s/p hysterectomy  Bone density: 2010. "good"  Mammography: 2012 NOV--unremarkable  Allergies  Allergen Reactions  . Promethazine     "Makes me feel like I want to jump out of my skin"    Current Outpatient Prescriptions  Medication Sig Dispense Refill  . albuterol (PROVENTIL) (2.5  MG/3ML) 0.083% nebulizer solution Take 2.5 mg by nebulization every 6 (six) hours as needed for wheezing.      Marland Kitchen atenolol (TENORMIN) 25 MG tablet Take 12.5 mg by mouth 2 (two) times daily.      . budesonide-formoterol (SYMBICORT) 160-4.5 MCG/ACT inhaler Inhale 2 puffs into the lungs 2 (two) times daily.      Marland Kitchen buPROPion (WELLBUTRIN SR) 150 MG 12 hr tablet Take 1 tablet (150 mg total) by mouth 2 (two) times daily.  180 tablet  3  . busPIRone (BUSPAR) 15 MG tablet Take 1 tablet (15 mg total) by mouth 2 (two) times daily.  180 tablet  2  . clopidogrel (PLAVIX) 75 MG tablet Take 1 tablet (75 mg total) by mouth daily with breakfast.  60 tablet  3  . diazepam (VALIUM) 5 MG tablet Take 5-10 mg by mouth at bedtime as needed for sleep.      Marland Kitchen docusate sodium (COLACE) 100 MG capsule Take 100 mg by mouth 2 (two) times daily.      . Ipratropium-Albuterol (COMBIVENT RESPIMAT) 20-100 MCG/ACT AERS respimat Inhale 1 puff into the lungs every 6 (six) hours as needed for wheezing or shortness of breath.      . lovastatin (MEVACOR) 40 MG tablet Take 1 tablet (40  mg total) by mouth daily.  90 tablet  2  . omeprazole (PRILOSEC) 40 MG capsule Take 1 capsule (40 mg total) by mouth daily.  90 capsule  3  . ondansetron (ZOFRAN) 4 MG/2ML SOLN injection Inject 4 mg into the vein at bedtime. Pt gets through her port.      . ondansetron (ZOFRAN) 8 MG tablet Take 8 mg by mouth every 8 (eight) hours as needed for nausea.      . ONE TOUCH ULTRA TEST test strip USE AS DIRECTED  100 each  0  . oxyCODONE-acetaminophen (PERCOCET/ROXICET) 5-325 MG per tablet Take 1 tablet by mouth every 6 (six) hours as needed for pain.  100 tablet  0  . polyethylene glycol (MIRALAX / GLYCOLAX) packet Take 17 g by mouth daily.      Marland Kitchen PRESCRIPTION MEDICATION Inject into the vein every 21 ( twenty-one) days. Doxil infusion q3weeks      . PRESCRIPTION MEDICATION at bedtime. Home Paranatal Nutrition from [facility].      . ranitidine (ZANTAC) 150 MG tablet Take 1 tablet (150 mg total) by mouth 2 (two) times daily.  180 tablet  3  . sertraline (ZOLOFT) 100 MG tablet Take 1 tablet (100 mg total) by mouth daily before breakfast.  90 tablet  3  . ALPRAZolam (XANAX) 0.5 MG tablet Take 1 tablet (0.5 mg total) by mouth 2 (two) times daily as needed for sleep.  45 tablet  0  . ciprofloxacin (CIPRO) 500 MG tablet Take 1 tablet (500 mg total) by mouth 2 (two) times daily.  14 tablet  1  . [DISCONTINUED] busPIRone (BUSPAR) 15 MG tablet Take 1 tablet (15 mg total) by mouth 2 (two) times daily.  180 tablet  1  . [DISCONTINUED] roflumilast (DALIRESP) 500 MCG TABS tablet Take 500 mcg by mouth daily.         No current facility-administered medications for this visit.   OBJECTIVE: Older white woman who appears tired but in no acute distress. She was able to get onto the exam table today with some assistance for her physical exam.  Filed Vitals:   01/19/13 1007  BP: 110/65  Pulse: 92  Temp: 97.7  F (36.5 C)  Resp: 20     Body mass index is 21.12 kg/(m^2).    ECOG FS: 2 Filed Weights    01/19/13 1007  Weight: 123 lb 1.6 oz (55.838 kg)   Sclerae unicteric Oropharynx clear,   Lungs diminished breath sounds bilaterally in the bases, more so on the right than the left; No wheezes or rhonchi Heart regular rate and rhythm Abdomen soft, nontender, nondistended with no obvious ascites, positive bowel sounds, clean dry dressing is intact in the right lower abdomen status post recent removal of  Aspira. Residual signs of a vesicular rash on the lower abdomen and groin area, dry and scabbing in appearance and resolving. Hands are unremarkable, with no evidence of PPE. MSK  no peripheral edema Neuro: nonfocal, well oriented, anxious affect Breasts: Deferred Port is intact in the right upper chest wall   LAB RESULTS: Lab Results  Component Value Date   WBC 4.9 01/19/2013   NEUTROABS 0.5* 01/19/2013   HGB 9.8* 01/19/2013   HCT 30.6* 01/19/2013   MCV 88.4 01/19/2013   PLT 568* 01/19/2013      Chemistry      Component Value Date/Time   NA 140 01/12/2013 1353   NA 134* 01/08/2013 0427   K 4.5 01/12/2013 1353   K 3.8 01/08/2013 0427   CL 101 01/12/2013 1353   CL 98 01/08/2013 0427   CO2 28 01/12/2013 1353   CO2 28 01/08/2013 0427   BUN 24.1 01/12/2013 1353   BUN 18 01/08/2013 0427   CREATININE 0.6 01/12/2013 1353   CREATININE 0.45* 01/08/2013 0427      Component Value Date/Time   CALCIUM 9.6 01/12/2013 1353   CALCIUM 9.1 01/08/2013 0427   ALKPHOS 124 01/12/2013 1353   ALKPHOS 112 01/07/2013 0407   AST 16 01/12/2013 1353   AST 12 01/07/2013 0407   ALT 16 01/12/2013 1353   ALT 12 01/07/2013 0407   BILITOT <0.20 Repeated and Verified 01/12/2013 1353   BILITOT 0.1* 01/07/2013 0407     CA 125  462.6  01/12/2013    475.8  12/15/2012    515.9  12/08/2012    473.0  11/17/2012    599.7  10/27/2012    1066.3  10/06/2012    STUDIES:  Dg Abd 1 View  01/07/2013   *RADIOLOGY REPORT*  Clinical Data: 72 year old female lower abdominal pain.  Possible bowel obstruction.  ABDOMEN - 1 VIEW  Comparison:  01/06/2013 and earlier.  Findings: AP portable supine view 0731 hours.  Percutaneous catheter again projects through the lower abdomen and pelvis. Interval improved bowel gas pattern with decreased gaseous distention of small bowel.  Ascending and descending colon gas plus gas in the rectum remain visible. Stable visualized osseous structures.  Probable small pleural effusions. No definite pneumoperitoneum.  IMPRESSION: 1.  Interval improved bowel gas pattern, nonobstructed. 2.  Small pleural effusions suspected. 3.  Lower abdominal/pelvic catheter remains in place.   Original Report Authenticated By: Erskine Speed, M.D.    Ct Abdomen Pelvis W Contrast  01/07/2013   *RADIOLOGY REPORT*  Clinical Data: Abdominal pain, distension, nausea, weight loss, constipation, metastatic ovarian cancer  CT ABDOMEN AND PELVIS WITH CONTRAST  Technique:  Multidetector CT imaging of the abdomen and pelvis was performed following the standard protocol during bolus administration of intravenous contrast.  Contrast: OMNIPAQUE IOHEXOL 300 MG/ML  SOLN  Comparison: 11/02/2012  Findings: Moderate bilateral pleural effusions, new.  Liver, spleen, pancreas, and adrenal glands are within normal limits.  Distended gallbladder, although improved.  No intrahepatic or extrahepatic ductal dilatation.  4 mm nonobstructing left lower pole renal calculus (series 2/image 32).  5 mm right lower pole renal cyst.  No hydronephrosis.  Small volume abdominal ascites, similar.  Peritoneal drainage catheter terminates in the left mid abdomen (series 2/image 54).  3.8 x 6.0 cm fluid collection posterior to the stomach (series 2/image 25), now with a developing thin rim.  Peritoneal carcinomatosis, mildly improved.  For example, omental caking measures 4.0 x 10.2 cm beneath the left anterior abdominal wall (series 2/image 34), previously 5.2 x 10.9 cm.  A 3.6 x 3.5 cm gastrohepatic ligament implant (series 2/image 26) previously measured 4.0 x 3.7 cm.   Additional area of tumor in the lower pelvis is difficult to discretely measure.  No evidence of bowel obstruction. While mildly prominent loops of small bowel are present in the left lower abdomen, contrast opacifies the proximal colon.  Atherosclerotic calcifications of the abdominal aorta and branch vessels.  Bladder is within normal limits.  Degenerative changes of the visualized thoracolumbar spine.  IMPRESSION: Peritoneal carcinomatosis, mildly improved.  Small volume abdominal ascites with peritoneal drainage catheter, grossly unchanged.  Developing loculated 3.8 x 6.0 cm fluid collection posterior to the stomach.  Moderate bilateral pleural effusions, new.  No evidence of bowel obstruction.  Additional stable ancillary findings as above.   Original Report Authenticated By: Charline Bills, M.D.       ASSESSMENT: 72 y.o.  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 [V323G (1196T>G)]    (1) status post left modified radical mastectomy October 1993 for a T2 N0 invasive ductal breast, estrogen and progesterone receptor positive cancer treated adjuvantly with MFL (methotrexate/fluorouracil/leucovorin) chemotherapy x6, completed April 1994, followed by 5 years of tamoxifen completed April 1999, with no evidence of recurrence  (2) now status post TAH BSO, omentectomy, and suboptimal debulking 10/29/2011 for a high-grade serous adenocarcinoma of the ovary, pT3c NX (Stage IIIC) , s/p eight cycles of carboplatin/paclitaxel completed 05/05/2012 (initial CA125 of 2050.7 normalized after cycle 5)  (3) recurrent disease documented by rising CEA 125 an abdominal CT scan 09/21/2012.  (4) COPD with ongoing tobacco abuse  (5) diabetes mellitus  (6) genetic testing for Lynch syndrome pending.  (7)  status post hospitalization in July 2013 for stroke/small acute infarcts in the left sub insular white matter and coronal radiata.  (8) port in place  (9) left  upper lobe 6 mm subpleural nodule, without associated hypermetabolic activity (likely well below the resolution of PET imaging) noted August 2013  (10) CTs obtained in [place] 2014 showed evidence of significant progression of disease with widespread intraperitoneal metastases and a moderate volume of presumably malignant ascites.  (11) treated with Doxil on a Q. three-week basis, first dose given on 10/06/2012, discontinued after 5 cycles, last dose given on 12/29/2012, secondary to hand/foot syndrome and skin changes.  (12) cholecystitis, with cholecystectomy pending  (13)  Hx of recurring ascites, requiring therapeutic paracentesis and Aspira drain, removed on 01/15/13  (14) Malnutrition, on TPN.  Apparent infiltration of TPN noted on 12/15/12, resolved   (15)  Hx of small bowel obstruction requiring hospitalization, 01/06/2013 - 01/08/2013  (16)  initiating treatment with carboplatin to be given every 21 days   PLAN:  This case was reviewed with Dr. Darnelle Catalan, and we both agree that it is necessary to postpone Kymora's first dose of carboplatin today secondary to afebrile neutropenia. I have reviewed neutropenic precautions with  the patient and her [name] [name] today, and she is being started on Cipro prophylactically, 500 mg by mouth twice a day for the next 7 days.   We will hope to initiate day 1 cycle 1 of q. three-week carboplatin when she returns next week on June 24, and I will see her that day for repeat labs and physical exam.   With regards to her bowels, she will continue using Colace and MiraLAX as needed, but will let us know if she is not having regular bowel movements at least every 2-3 days as she is now. All this was reviewed with the patient in detail today, and she voices understanding and agreement with this plan. They know to call with any changes or problems prior to her next scheduled appointment.   Verbie Babic    01/19/2013

## 2013-01-19 NOTE — Telephone Encounter (Signed)
Per staff phone call and POF I have schedueld appts.  JMW  

## 2013-01-20 ENCOUNTER — Other Ambulatory Visit (HOSPITAL_COMMUNITY): Payer: Medicare Other

## 2013-01-20 ENCOUNTER — Ambulatory Visit (HOSPITAL_COMMUNITY): Payer: Medicare Other

## 2013-01-22 NOTE — Telephone Encounter (Signed)
error 

## 2013-01-26 ENCOUNTER — Ambulatory Visit (HOSPITAL_COMMUNITY)
Admission: RE | Admit: 2013-01-26 | Discharge: 2013-01-26 | Disposition: A | Discharge status: Home / Self Care | Payer: Medicare Other | Source: Ambulatory Visit | Attending: Physician Assistant | Admitting: Physician Assistant

## 2013-01-26 ENCOUNTER — Telehealth: Payer: Self-pay | Admitting: *Deleted

## 2013-01-26 ENCOUNTER — Other Ambulatory Visit (HOSPITAL_BASED_OUTPATIENT_CLINIC_OR_DEPARTMENT_OTHER): Payer: Medicare Other | Admitting: Lab

## 2013-01-26 ENCOUNTER — Ambulatory Visit (HOSPITAL_BASED_OUTPATIENT_CLINIC_OR_DEPARTMENT_OTHER): Payer: Medicare Other

## 2013-01-26 ENCOUNTER — Ambulatory Visit (HOSPITAL_BASED_OUTPATIENT_CLINIC_OR_DEPARTMENT_OTHER): Payer: Medicare Other | Admitting: Physician Assistant

## 2013-01-26 ENCOUNTER — Encounter: Payer: Self-pay | Admitting: Physician Assistant

## 2013-01-26 VITALS — BP 98/0 | HR 90 | Temp 97.9°F | Resp 20 | Ht 64.0 in | Wt 121.6 lb

## 2013-01-26 DIAGNOSIS — C569 Malignant neoplasm of unspecified ovary: Secondary | ICD-10-CM

## 2013-01-26 DIAGNOSIS — K5900 Constipation, unspecified: Secondary | ICD-10-CM

## 2013-01-26 DIAGNOSIS — Z853 Personal history of malignant neoplasm of breast: Secondary | ICD-10-CM

## 2013-01-26 DIAGNOSIS — R05 Cough: Secondary | ICD-10-CM | POA: Insufficient documentation

## 2013-01-26 DIAGNOSIS — R0602 Shortness of breath: Secondary | ICD-10-CM

## 2013-01-26 DIAGNOSIS — Z5111 Encounter for antineoplastic chemotherapy: Secondary | ICD-10-CM

## 2013-01-26 DIAGNOSIS — J9819 Other pulmonary collapse: Secondary | ICD-10-CM | POA: Insufficient documentation

## 2013-01-26 DIAGNOSIS — R059 Cough, unspecified: Secondary | ICD-10-CM | POA: Insufficient documentation

## 2013-01-26 DIAGNOSIS — D702 Other drug-induced agranulocytosis: Secondary | ICD-10-CM

## 2013-01-26 DIAGNOSIS — J90 Pleural effusion, not elsewhere classified: Secondary | ICD-10-CM | POA: Insufficient documentation

## 2013-01-26 LAB — CBC WITH DIFFERENTIAL/PLATELET
Basophils Absolute: 0.1 10*3/uL (ref 0.0–0.1)
Eosinophils Absolute: 0.1 10*3/uL (ref 0.0–0.5)
HCT: 31.9 % — ABNORMAL LOW (ref 34.8–46.6)
LYMPH%: 17.3 % (ref 14.0–49.7)
MCV: 88.1 fL (ref 79.5–101.0)
MONO#: 1.6 10*3/uL — ABNORMAL HIGH (ref 0.1–0.9)
MONO%: 11.2 % (ref 0.0–14.0)
NEUT#: 10.3 10*3/uL — ABNORMAL HIGH (ref 1.5–6.5)
NEUT%: 70.2 % (ref 38.4–76.8)
Platelets: 459 10*3/uL — ABNORMAL HIGH (ref 145–400)
WBC: 14.7 10*3/uL — ABNORMAL HIGH (ref 3.9–10.3)

## 2013-01-26 LAB — COMPREHENSIVE METABOLIC PANEL (CC13)
Alkaline Phosphatase: 115 U/L (ref 40–150)
BUN: 25.7 mg/dL (ref 7.0–26.0)
CO2: 27 mEq/L (ref 22–29)
Creatinine: 0.7 mg/dL (ref 0.6–1.1)
Glucose: 97 mg/dl (ref 70–99)
Total Bilirubin: 0.22 mg/dL (ref 0.20–1.20)
Total Protein: 7.3 g/dL (ref 6.4–8.3)

## 2013-01-26 LAB — CA 125: CA 125: 478.4 U/mL — ABNORMAL HIGH (ref 0.0–30.2)

## 2013-01-26 MED ORDER — SODIUM CHLORIDE 0.9 % IV SOLN
450.0000 mg | Freq: Once | INTRAVENOUS | Status: AC
  Administered 2013-01-26: 450 mg via INTRAVENOUS
  Filled 2013-01-26: qty 45

## 2013-01-26 MED ORDER — ONDANSETRON 16 MG/50ML IVPB (CHCC)
16.0000 mg | Freq: Once | INTRAVENOUS | Status: AC
  Administered 2013-01-26: 16 mg via INTRAVENOUS

## 2013-01-26 MED ORDER — HEPARIN SOD (PORK) LOCK FLUSH 100 UNIT/ML IV SOLN
500.0000 [IU] | Freq: Once | INTRAVENOUS | Status: AC | PRN
  Administered 2013-01-26: 500 [IU]
  Filled 2013-01-26: qty 5

## 2013-01-26 MED ORDER — CARBOPLATIN CHEMO INTRADERMAL TEST DOSE 100MCG/0.02ML
100.0000 ug | Freq: Once | INTRADERMAL | Status: AC
  Administered 2013-01-26: 100 ug via INTRADERMAL
  Filled 2013-01-26: qty 0.01

## 2013-01-26 MED ORDER — DEXAMETHASONE SODIUM PHOSPHATE 20 MG/5ML IJ SOLN
20.0000 mg | Freq: Once | INTRAMUSCULAR | Status: AC
  Administered 2013-01-26: 20 mg via INTRAVENOUS

## 2013-01-26 MED ORDER — SODIUM CHLORIDE 0.9 % IV SOLN
Freq: Once | INTRAVENOUS | Status: AC
  Administered 2013-01-26: 17:00:00 via INTRAVENOUS

## 2013-01-26 MED ORDER — SODIUM CHLORIDE 0.9 % IJ SOLN
10.0000 mL | INTRAMUSCULAR | Status: DC | PRN
  Administered 2013-01-26: 10 mL
  Filled 2013-01-26: qty 10

## 2013-01-26 MED ORDER — SODIUM CHLORIDE 0.9 % IV SOLN: 520.0000 mg | Freq: Once | INTRAVENOUS | Status: DC

## 2013-01-26 NOTE — Progress Notes (Signed)
Carbo test dose in RAFA neg at 5', 15', and 30' minutes.

## 2013-01-26 NOTE — Patient Instructions (Addendum)
[facility] Discharge Instructions for Patients Receiving Chemotherapy  Today you received the following chemotherapy agents CARBOPLATIN  To help prevent nausea and vomiting after your treatment, we encourage you to take your nausea medication as directed if needed.   If you develop nausea and vomiting that is not controlled by your nausea medication, call the clinic.   BELOW ARE SYMPTOMS THAT SHOULD BE REPORTED IMMEDIATELY:  *FEVER GREATER THAN 100.5 F  *CHILLS WITH OR WITHOUT FEVER  NAUSEA AND VOMITING THAT IS NOT CONTROLLED WITH YOUR NAUSEA MEDICATION  *UNUSUAL SHORTNESS OF BREATH  *UNUSUAL BRUISING OR BLEEDING  TENDERNESS IN MOUTH AND THROAT WITH OR WITHOUT PRESENCE OF ULCERS  *URINARY PROBLEMS  *BOWEL PROBLEMS  UNUSUAL RASH Items with * indicate a potential emergency and should be followed up as soon as possible.  Feel free to call the clinic you have any questions or concerns. The clinic phone number is [phone].

## 2013-01-26 NOTE — Progress Notes (Signed)
ID: [name] [name]   DOB: 1941/04/15  MR#: [id number]  CSN#:[id number]  PCP:  GYN: Laurette Schimke, MD SU: [name] OTHER MD:  HISTORY OF PRESENT ILLNESS: [name] [name] is a 72 year-old [place] woman I followed remotely for a history of breast cancer. More recently she noted that her belly was getting "bigger". Gradually she has developed increasing low back pain. After a period of several months, as her symptoms increased, she brought this problem to her primary physician's attention and a KUB was obtained on 10/07/2011, which was unremarkable. This was followed by a CT scan 10/15/2011, which showed ascites and omental caking. Paracentesis 10/18/2011 showed ([id number]) malignant cells consistent with metastatic adenocarcinoma, with equivocal staining for estrogen receptor. The pattern of additional stains was most suggestive of a primary gynecologic tumor. CA 125 at this time was 2051.  The patient was referred to gynecologic oncology and on 10/29/2011 underwent exploratory laparotomy under Drs. Cleda Mccreedy and Antionette Char. This consisted of a total abdominal hysterectomy with bilateral salpingo-oophorectomy, omentectomy, and radical tumor debulking. Unfortunately there was significant tumor attached to the diaphragm so the patient was suboptimally debulked. Accordingly a peritoneal catheter was not placed. GYN-ONC recommended 6 cycles of carboplatin/paclitaxel given every 3 weeks, with Neulasta on day 2 for granulocyte support. Taleisha's subsequent history is as detailed below.  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 She was previously receiving q. three-week Doxil, but this has now been discontinued secondary to intolerance, primarily with hand/foot syndrome and a blistering rash in the lower abdomen. She was scheduled to receive her first every 3 week doses of carboplatin last week, but this was  delayed due to to afebrile neutropenia with an ANC of 0.5. Her counts have recovered nicely, and she is now ready to initiate treatment.   [name] is completing her prophylactic antibiotic, and denies any fevers or chills. Her only complaints today are a recent onset of a "wet cough" along with increased shortness of breath, both with exertion and at rest.  She still able to perform all of her ADLs on her own at home, but finds she has to rest frequently. Her energy level is stable. She has "some good days and some bad days".   She continues on TPN given nightly over 12 hours. This is followed by [facility].  She continues to have some constipation but manages to have bowel movements at least every 2-3 days with the use of laxatives.  They used a fleets enema last Saturday with "great results". She denies any significant abdominal or pelvic pain, and has had no nausea or emesis.   REVIEW OF SYSTEMS:  Cynitha denies any current rashes or skin changes. She denies any signs of abnormal bruising or bleeding.  She has no chest pain or palpitations.  She denies abnormal headaches, change in vision  or  increased dizziness. She currently denies any unusual pain, and has had no peripheral neuropathy  or edema.  A detailed view of systems is otherwise stable and noncontributory.   PAST MEDICAL HISTORY: Past Medical History  Diagnosis Date  . History of breast cancer   . COPD (chronic obstructive pulmonary disease)   . GERD (gastroesophageal reflux disease)   . Diabetes mellitus     type II  . Hx of colonic polyps   . Osteoarthritis   . MVP (mitral valve prolapse)   . Dysrhythmia     pt states runs a  little fast   . Shortness of breath     with exertion   . Depression   . Anxiety   . Heart murmur   . Stroke   . Ascites 10/22/2012  . metastases dx'd 10/2011  . Ovarian cancer dx'd2/2013    active chemotherapy  . Breast CA 1993  . Partial small bowel obstruction 01/2013    PAST  SURGICAL HISTORY: Past Surgical History  Procedure Laterality Date  . Cesarean section  1980, 1982    X 2   . Laparoscopy      work up for infertility  . Tympanoplasty      left X 2  . Left breast fibroadenoma removal  1960's  . Breast cyst aspirations    . Left modified radial mastectomy      >20 years ago  . Laparotomy  10/29/2011    Procedure: EXPLORATORY LAPAROTOMY;  Surgeon: Rejeana Brock A. Duard Brady, MD;  Location: [facility];  Service: Gynecology;  Laterality: N/A;  . Abdominal hysterectomy  10/29/2011    Procedure: HYSTERECTOMY ABDOMINAL;  Surgeon: Rejeana Brock A. Duard Brady, MD;  Location: [facility];  Service: Gynecology;  Laterality: N/A;  Total abdominal hysterectomy  . Salpingoophorectomy  10/29/2011    Procedure: SALPINGO OOPHERECTOMY;  Surgeon: Rejeana Brock A. Duard Brady, MD;  Location: [facility];  Service: Gynecology;  Laterality: Bilateral;    FAMILY HISTORY Family History  Problem Relation Age of Onset  . Cancer Mother     endometrial/ colon  . Cancer Brother     colon  . COPD Other   . Hypertension Other   . Cancer Maternal Uncle     prostate/bladder/lung  . Cancer Paternal Aunt     unknown abdominal cancer  . Cancer Maternal Grandmother     Salivary gland cancer  . Cancer Paternal Aunt     leukemia   the patient's father died at the age of 107. The patient's mother died at the age of 13; she had a history of colon cancer and endometrial cancer. The patient has no sisters. She has one brother, with a history of colon cancer. There is no history of breast cancer in the family. The patient is scheduled for genetic testing later this month.  GYNECOLOGIC HISTORY: She had menarche age 25. First pregnancy to term was at age 58. She is GX P2. She became menopausal at the time of her chemotherapy for breast cancer. This was age 26. 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 Her husband [name] runs a [employer], sport and exercise out of their own home.  Daughter [name] [name], 54,  currently lives at home with the patient. There are some issues relating to this daughter that the patient discussed briefly. [place]hter. [name] [name], 30, is an ED nurse at [employer]. The patient has no grandchildren. She is not a Advice worker.   ADVANCED DIRECTIVES: in place  HEALTH MAINTENANCE: History  Substance Use Topics  . Smoking status: Current Some Day Smoker -- 0.50 packs/day for 30 years    Types: Cigarettes  . Smokeless tobacco: Never Used     Comment: quit again 2009; restarted 1 ppd 2011  . Alcohol Use: No     Colonoscopy: 2009  PAP: s/p hysterectomy  Bone density: 2010. "good"  Mammography: 2012 NOV--unremarkable  Allergies  Allergen Reactions  . Promethazine     "Makes me feel like I want to jump out of my skin"    Current Outpatient Prescriptions  Medication Sig Dispense Refill  .  albuterol (PROVENTIL) (2.5 MG/3ML) 0.083% nebulizer solution Take 2.5 mg by nebulization every 6 (six) hours as needed for wheezing.      Marland Kitchen ALPRAZolam (XANAX) 0.5 MG tablet Take 1 tablet (0.5 mg total) by mouth 2 (two) times daily as needed for sleep.  45 tablet  0  . atenolol (TENORMIN) 25 MG tablet Take 12.5 mg by mouth 2 (two) times daily.      . budesonide-formoterol (SYMBICORT) 160-4.5 MCG/ACT inhaler Inhale 2 puffs into the lungs 2 (two) times daily.      Marland Kitchen buPROPion (WELLBUTRIN SR) 150 MG 12 hr tablet Take 1 tablet (150 mg total) by mouth 2 (two) times daily.  180 tablet  3  . busPIRone (BUSPAR) 15 MG tablet Take 1 tablet (15 mg total) by mouth 2 (two) times daily.  180 tablet  2  . ciprofloxacin (CIPRO) 500 MG tablet Take 1 tablet (500 mg total) by mouth 2 (two) times daily.  14 tablet  1  . clopidogrel (PLAVIX) 75 MG tablet Take 1 tablet (75 mg total) by mouth daily with breakfast.  60 tablet  3  . diazepam (VALIUM) 5 MG tablet Take 5-10 mg by mouth at bedtime as needed for sleep.      Marland Kitchen docusate sodium (COLACE) 100 MG capsule Take 100 mg by mouth 2 (two) times  daily.      . Ipratropium-Albuterol (COMBIVENT RESPIMAT) 20-100 MCG/ACT AERS respimat Inhale 1 puff into the lungs every 6 (six) hours as needed for wheezing or shortness of breath.      . lovastatin (MEVACOR) 40 MG tablet Take 1 tablet (40 mg total) by mouth daily.  90 tablet  2  . omeprazole (PRILOSEC) 40 MG capsule Take 1 capsule (40 mg total) by mouth daily.  90 capsule  3  . ondansetron (ZOFRAN) 4 MG/2ML SOLN injection Inject 4 mg into the vein at bedtime. Pt gets through her port.      . ondansetron (ZOFRAN) 8 MG tablet Take 8 mg by mouth every 8 (eight) hours as needed for nausea.      . ONE TOUCH ULTRA TEST test strip USE AS DIRECTED  100 each  0  . oxyCODONE-acetaminophen (PERCOCET/ROXICET) 5-325 MG per tablet Take 1 tablet by mouth every 6 (six) hours as needed for pain.  100 tablet  0  . polyethylene glycol (MIRALAX / GLYCOLAX) packet Take 17 g by mouth daily.      Marland Kitchen PRESCRIPTION MEDICATION Inject into the vein every 21 ( twenty-one) days. Doxil infusion q3weeks      . PRESCRIPTION MEDICATION at bedtime. Home Paranatal Nutrition from [facility].      . ranitidine (ZANTAC) 150 MG tablet Take 1 tablet (150 mg total) by mouth 2 (two) times daily.  180 tablet  3  . sertraline (ZOLOFT) 100 MG tablet Take 1 tablet (100 mg total) by mouth daily before breakfast.  90 tablet  3  . [DISCONTINUED] busPIRone (BUSPAR) 15 MG tablet Take 1 tablet (15 mg total) by mouth 2 (two) times daily.  180 tablet  1  . [DISCONTINUED] roflumilast (DALIRESP) 500 MCG TABS tablet Take 500 mcg by mouth daily.         No current facility-administered medications for this visit.   OBJECTIVE: Older white woman  in no acute distress. She appears weak, but was able to get onto the exam table today with some assistance.  Filed Vitals:   01/26/13 1343  BP: 98/0  Pulse: 90  Temp: 97.9 F (  36.6 C)  Resp: 20     Body mass index is 20.86 kg/(m^2).    ECOG FS: 2 Filed Weights   01/26/13 1343  Weight: 121 lb 9.6  oz (55.157 kg)   Sclerae unicteric Oropharynx clear, no ulcerations noted  There is a small palpable lymph node in the right cervical chain measuring between 0.5 and 1 cm, freely movable. No additional cervical or supraclavicular adenopathy noted. Lungs diminished breath sounds bilaterally in the bases, more so on the right than the left; No wheezes or rhonchi Heart regular rate and rhythm Abdomen soft, nontender, nondistended with no obvious ascites, positive bowel sounds,  MSK no focal spinal tenderness to gentle palpation No peripheral edema Neuro: nonfocal, well oriented, fatigued affect Breasts: Deferred. No axillary adenopathy palpated Port is intact in the right upper chest wall.   LAB RESULTS: Lab Results  Component Value Date   WBC 14.7* 01/26/2013   NEUTROABS 10.3* 01/26/2013   HGB 10.3* 01/26/2013   HCT 31.9* 01/26/2013   MCV 88.1 01/26/2013   PLT 459* 01/26/2013      Chemistry      Component Value Date/Time   NA 140 01/12/2013 1353   NA 134* 01/08/2013 0427   K 4.5 01/12/2013 1353   K 3.8 01/08/2013 0427   CL 101 01/12/2013 1353   CL 98 01/08/2013 0427   CO2 28 01/12/2013 1353   CO2 28 01/08/2013 0427   BUN 24.1 01/12/2013 1353   BUN 18 01/08/2013 0427   CREATININE 0.6 01/12/2013 1353   CREATININE 0.45* 01/08/2013 0427      Component Value Date/Time   CALCIUM 9.6 01/12/2013 1353   CALCIUM 9.1 01/08/2013 0427   ALKPHOS 124 01/12/2013 1353   ALKPHOS 112 01/07/2013 0407   AST 16 01/12/2013 1353   AST 12 01/07/2013 0407   ALT 16 01/12/2013 1353   ALT 12 01/07/2013 0407   BILITOT <0.20 Repeated and Verified 01/12/2013 1353   BILITOT 0.1* 01/07/2013 0407     CA 125  Pending 01/26/2013    462.6  01/12/2013    475.8  12/15/2012    515.9  12/08/2012    473.0  11/17/2012    599.7  10/27/2012    1066.3  10/06/2012    STUDIES:  Dg Chest 2 View  01/26/2013   *RADIOLOGY REPORT*  Clinical Data: Current history metastatic ovarian cancer, presenting with cough and shortness of breath.   Smoker.  CHEST - 2 VIEW  Comparison: Two-view chest x-ray 12/15/2012, 09/24/2012.  CT chest 07/06/2012.  Findings: Small bilateral pleural effusions, left greater than right, not significantly changed since the examination 12/15/2012. Associated minimal passive atelectasis in the left lower lobe, unchanged.  Lungs hyperinflated but otherwise clear.  Cardiac silhouette normal in size, unchanged.  Thoracic aorta mildly atherosclerotic, unchanged.  Hilar and mediastinal contours otherwise unremarkable.  Surgical clips in the left axilla from prior node dissection.  Right jugular Port-A-Cath tip in the mid SVC, unchanged.  IMPRESSION: Stable small bilateral pleural effusions and associated minimal passive atelectasis in the left lower lobe.  No new abnormalities.   Original Report Authenticated By: Hulan Saas, M.D.     Ct Abdomen Pelvis W Contrast  01/07/2013   *RADIOLOGY REPORT*  Clinical Data: Abdominal pain, distension, nausea, weight loss, constipation, metastatic ovarian cancer  CT ABDOMEN AND PELVIS WITH CONTRAST  Technique:  Multidetector CT imaging of the abdomen and pelvis was performed following the standard protocol during bolus administration of intravenous contrast.  Contrast: OMNIPAQUE IOHEXOL  300 MG/ML  SOLN  Comparison: 11/02/2012  Findings: Moderate bilateral pleural effusions, new.  Liver, spleen, pancreas, and adrenal glands are within normal limits.  Distended gallbladder, although improved.  No intrahepatic or extrahepatic ductal dilatation.  4 mm nonobstructing left lower pole renal calculus (series 2/image 32).  5 mm right lower pole renal cyst.  No hydronephrosis.  Small volume abdominal ascites, similar.  Peritoneal drainage catheter terminates in the left mid abdomen (series 2/image 54).  3.8 x 6.0 cm fluid collection posterior to the stomach (series 2/image 25), now with a developing thin rim.  Peritoneal carcinomatosis, mildly improved.  For example, omental caking measures 4.0  x 10.2 cm beneath the left anterior abdominal wall (series 2/image 34), previously 5.2 x 10.9 cm.  A 3.6 x 3.5 cm gastrohepatic ligament implant (series 2/image 26) previously measured 4.0 x 3.7 cm.  Additional area of tumor in the lower pelvis is difficult to discretely measure.  No evidence of bowel obstruction. While mildly prominent loops of small bowel are present in the left lower abdomen, contrast opacifies the proximal colon.  Atherosclerotic calcifications of the abdominal aorta and branch vessels.  Bladder is within normal limits.  Degenerative changes of the visualized thoracolumbar spine.  IMPRESSION: Peritoneal carcinomatosis, mildly improved.  Small volume abdominal ascites with peritoneal drainage catheter, grossly unchanged.  Developing loculated 3.8 x 6.0 cm fluid collection posterior to the stomach.  Moderate bilateral pleural effusions, new.  No evidence of bowel obstruction.  Additional stable ancillary findings as above.   Original Report Authenticated By: Charline Bills, M.D.       ASSESSMENT: 72 y.o.  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 [V323G (1196T>G)]    (1) status post left modified radical mastectomy October 1993 for a T2 N0 invasive ductal breast, estrogen and progesterone receptor positive cancer treated adjuvantly with MFL (methotrexate/fluorouracil/leucovorin) chemotherapy x6, completed April 1994, followed by 5 years of tamoxifen completed April 1999, with no evidence of recurrence  (2) now status post TAH BSO, omentectomy, and suboptimal debulking 10/29/2011 for a high-grade serous adenocarcinoma of the ovary, pT3c NX (Stage IIIC) , s/p eight cycles of carboplatin/paclitaxel completed 05/05/2012 (initial CA125 of 2050.7 normalized after cycle 5)  (3) recurrent disease documented by rising CEA 125 an abdominal CT scan 09/21/2012.  (4) COPD with ongoing tobacco abuse  (5) diabetes mellitus  (6) genetic testing for  Lynch syndrome pending.  (7)  status post hospitalization in July 2013 for stroke/small acute infarcts in the left sub insular white matter and coronal radiata.  (8) port in place  (9) left upper lobe 6 mm subpleural nodule, without associated hypermetabolic activity (likely well below the resolution of PET imaging) noted August 2013  (10) CTs obtained in [place] 2014 showed evidence of significant progression of disease with widespread intraperitoneal metastases and a moderate volume of presumably malignant ascites.  (11) treated with Doxil on a Q. three-week basis, first dose given on 10/06/2012, discontinued after 5 cycles, last dose given on 12/29/2012, secondary to hand/foot syndrome and skin changes.  (12) cholecystitis, with cholecystectomy pending  (13)  Hx of recurring ascites, requiring therapeutic paracentesis and Aspira drain, removed on 01/15/13  (14) Malnutrition, on TPN.  Apparent infiltration of TPN noted on 12/15/12, resolved   (15)  Hx of small bowel obstruction requiring hospitalization, 01/06/2013 - 01/08/2013  (16)  initiating treatment with carboplatin to be given every 21 days, first dose on 01/26/2013   PLAN:  This case was  reviewed with Dr. Darnelle Catalan, and fortunately Chloe is chest x-ray was stable. She'll proceed with treatment today as scheduled for her first every 3 week dose of carboplatin. She return next week for repeat labs and physical exam with Dr. Darnelle Catalan on July 1.   With regards to her bowels, she will continue using Colace and MiraLAX as needed, but will let us know if she is not having regular bowel movements at least every 2-3 days as she is now.  I encouraged her to increase the Colace from one tablet twice daily to 2 tablets twice daily as long she is not having loose bowel movements.  Loistine Simas and [place] both voice understanding and agreement with our plan, and no called any changes or problems prior to her next appointment.   Teaghan Melrose     01/26/2013

## 2013-01-26 NOTE — Telephone Encounter (Signed)
appts made and printed...td 

## 2013-01-26 NOTE — Telephone Encounter (Signed)
Pt is gone to have her cxr...td

## 2013-02-02 ENCOUNTER — Other Ambulatory Visit (HOSPITAL_BASED_OUTPATIENT_CLINIC_OR_DEPARTMENT_OTHER): Payer: Medicare Other | Admitting: Lab

## 2013-02-02 ENCOUNTER — Telehealth: Payer: Self-pay | Admitting: Oncology

## 2013-02-02 ENCOUNTER — Ambulatory Visit (HOSPITAL_BASED_OUTPATIENT_CLINIC_OR_DEPARTMENT_OTHER): Payer: Medicare Other | Admitting: Oncology

## 2013-02-02 VITALS — BP 99/63 | HR 96 | Temp 98.2°F | Resp 20 | Ht 64.0 in | Wt 122.6 lb

## 2013-02-02 DIAGNOSIS — F17200 Nicotine dependence, unspecified, uncomplicated: Secondary | ICD-10-CM

## 2013-02-02 DIAGNOSIS — R141 Gas pain: Secondary | ICD-10-CM

## 2013-02-02 DIAGNOSIS — R140 Abdominal distension (gaseous): Secondary | ICD-10-CM

## 2013-02-02 DIAGNOSIS — C569 Malignant neoplasm of unspecified ovary: Secondary | ICD-10-CM

## 2013-02-02 DIAGNOSIS — K56600 Partial intestinal obstruction, unspecified as to cause: Secondary | ICD-10-CM

## 2013-02-02 DIAGNOSIS — D702 Other drug-induced agranulocytosis: Secondary | ICD-10-CM

## 2013-02-02 DIAGNOSIS — K56609 Unspecified intestinal obstruction, unspecified as to partial versus complete obstruction: Secondary | ICD-10-CM

## 2013-02-02 DIAGNOSIS — R188 Other ascites: Secondary | ICD-10-CM

## 2013-02-02 DIAGNOSIS — Z853 Personal history of malignant neoplasm of breast: Secondary | ICD-10-CM

## 2013-02-02 LAB — CBC WITH DIFFERENTIAL/PLATELET
BASO%: 0.3 % (ref 0.0–2.0)
Eosinophils Absolute: 0.1 10*3/uL (ref 0.0–0.5)
HCT: 30 % — ABNORMAL LOW (ref 34.8–46.6)
HGB: 9.7 g/dL — ABNORMAL LOW (ref 11.6–15.9)
MCHC: 32.3 g/dL (ref 31.5–36.0)
MONO#: 0.7 10*3/uL (ref 0.1–0.9)
NEUT#: 5.2 10*3/uL (ref 1.5–6.5)
NEUT%: 64.9 % (ref 38.4–76.8)
WBC: 8 10*3/uL (ref 3.9–10.3)
lymph#: 2 10*3/uL (ref 0.9–3.3)
nRBC: 0 % (ref 0–0)

## 2013-02-02 MED ORDER — OXYCODONE-ACETAMINOPHEN 5-325 MG PO TABS: 1.0000 | ORAL_TABLET | Freq: Four times a day (QID) | ORAL | Status: DC | PRN

## 2013-02-02 NOTE — Addendum Note (Signed)
Addended by: Billey Co on: 02/02/2013 05:43 PM   Modules accepted: Orders

## 2013-02-02 NOTE — Progress Notes (Signed)
ID: [name] [name]   DOB: January 13, 1941  MR#: [id number]  CSN#:[id number]  PCP:  GYN: [name] Schimke, MD SU: [name] [name] OTHER MD:  HISTORY OF PRESENT ILLNESS: [name] [name] is a 72 year-old [place] woman I followed remotely for a history of breast cancer. More recently she noted that her belly was getting "bigger". Gradually she has developed increasing low back pain. After a period of several months, as her symptoms increased, she brought this problem to her primary physician's attention and a KUB was obtained on 10/07/2011, which was unremarkable. This was followed by a CT scan 10/15/2011, which showed ascites and omental caking. Paracentesis 10/18/2011 showed ([id number]) malignant cells consistent with metastatic adenocarcinoma, with equivocal staining for estrogen receptor. The pattern of additional stains was most suggestive of a primary gynecologic tumor. CA 125 at this time was 2051.  The patient was referred to gynecologic oncology and on 10/29/2011 underwent exploratory laparotomy under Drs. [name] [name] and [name] [name]. This consisted of a total abdominal hysterectomy with bilateral salpingo-oophorectomy, omentectomy, and radical tumor debulking. Unfortunately there was significant tumor attached to the diaphragm so the patient was suboptimally debulked. Accordingly a peritoneal catheter was not placed. GYN-ONC recommended 6 cycles of carboplatin/paclitaxel given every 3 weeks, with Neulasta on day 2 for granulocyte support. [name] [name]'s subsequent history is as detailed below.  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 Today is day 8 cycle 1 of her every three-week carboplatin  REVIEW OF SYSTEMS:  [name] [name] tolerated the carboplatin without any unusual side effects. In particular she had no unusual fatigue, nausea, or vomiting. She has trouble sleeping partly because of aches and pains, and that chiefly  involves her abdomen in the left upper quadrant and her back. She takes a Percocet at bedtime which helps. She is trying to need more and has had r and cheese, and 8, and sometimes even status post. When she gets a little ambition as she gets significant discomfort in her abdomen and rarely vomits. She is on nightly TNA with no complications that she is aware of a detailed review of systems is otherwise noncontributory today  PAST MEDICAL HISTORY: Past Medical History  Diagnosis Date  . History of breast cancer   . COPD (chronic obstructive pulmonary disease)   . GERD (gastroesophageal reflux disease)   . Diabetes mellitus     type II  . Hx of colonic polyps   . Osteoarthritis   . MVP (mitral valve prolapse)   . Dysrhythmia     pt states runs a little fast   . Shortness of breath     with exertion   . Depression   . Anxiety   . Heart murmur   . Stroke   . Ascites 10/22/2012  . metastases dx'd 10/2011  . Ovarian cancer dx'd2/2013    active chemotherapy  . Breast CA 1993  . Partial small bowel obstruction 01/2013    PAST SURGICAL HISTORY: Past Surgical History  Procedure Laterality Date  . Cesarean section  1980, 1982    X 2   . Laparoscopy      work up for infertility  . Tympanoplasty      left X 2  . Left breast fibroadenoma removal  1960's  . Breast cyst aspirations    . Left modified radial mastectomy      >20 years ago  . Laparotomy  10/29/2011    Procedure: EXPLORATORY LAPAROTOMY;  Surgeon: [name] Brock A. Duard Brady,  MD;  Location: [facility];  Service: Gynecology;  Laterality: N/A;  . Abdominal hysterectomy  10/29/2011    Procedure: HYSTERECTOMY ABDOMINAL;  Surgeon: [name] Brock A. Duard Brady, MD;  Location: [facility];  Service: Gynecology;  Laterality: N/A;  Total abdominal hysterectomy  . Salpingoophorectomy  10/29/2011    Procedure: SALPINGO OOPHERECTOMY;  Surgeon: [name] Brock A. Duard Brady, MD;  Location: [facility];  Service: Gynecology;  Laterality: Bilateral;    FAMILY HISTORY Family History  Problem  Relation Age of Onset  . Cancer Mother     endometrial/ colon  . Cancer Brother     colon  . COPD Other   . Hypertension Other   . Cancer Maternal Uncle     prostate/bladder/lung  . Cancer Paternal Aunt     unknown abdominal cancer  . Cancer Maternal Grandmother     Salivary gland cancer  . Cancer Paternal Aunt     leukemia   the patient's father died at the age of 71. The patient's mother died at the age of 10; she had a history of colon cancer and endometrial cancer. The patient has no sisters. She has one brother, with a history of colon cancer. There is no history of breast cancer in the family. The patient is scheduled for genetic testing later this month.  GYNECOLOGIC HISTORY: She had menarche age 49. First pregnancy to term was at age 20. She is GX P2. She became menopausal at the time of her chemotherapy for breast cancer. This was age 62. 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 Her husband [name] [name] runs a Psych[employer], sport and exercise out of their own home. Daughter [name] [name], [name],  currently lives at home with the patient. There are some issues relating to this daughter that the patient discussed briefly. Dauhter. [name] [name], [name], is an ED nurse at [employer]. The patient has no grandchildren. She is not a Advice worker.   ADVANCED DIRECTIVES: in place  HEALTH MAINTENANCE: History  Substance Use Topics  . Smoking status: Current Some Day Smoker -- 0.50 packs/day for [name] years    Types: Cigarettes  . Smokeless tobacco: Never Used     Comment: quit again 2009; restarted 1 ppd 2011  . Alcohol Use: No     Colonoscopy: 2009  PAP: s/p hysterectomy  Bone density: 2010. "good"  Mammography: 2012 NOV--unremarkable  Allergies  Allergen Reactions  . Promethazine     "Makes me feel like I want to jump out of my skin"    Current Outpatient Prescriptions  Medication Sig Dispense Refill  . albuterol (PROVENTIL) (2.5 MG/3ML)  0.083% nebulizer solution Take 2.5 mg by nebulization every 6 (six) hours as needed for wheezing.      Marland Kitchen ALPRAZolam (XANAX) 0.5 MG tablet Take 1 tablet (0.5 mg total) by mouth 2 (two) times daily as needed for sleep.  45 tablet  0  . atenolol (TENORMIN) 25 MG tablet Take 12.5 mg by mouth 2 (two) times daily.      . budesonide-formoterol (SYMBICORT) 160-4.5 MCG/ACT inhaler Inhale 2 puffs into the lungs 2 (two) times daily.      Marland Kitchen buPROPion (WELLBUTRIN SR) 150 MG 12 hr tablet Take 1 tablet (150 mg total) by mouth 2 (two) times daily.  180 tablet  3  . busPIRone (BUSPAR) 15 MG tablet Take 1 tablet (15 mg total) by mouth 2 (two) times daily.  180 tablet  2  . ciprofloxacin (CIPRO) 500 MG tablet Take 1 tablet (500  mg total) by mouth 2 (two) times daily.  14 tablet  1  . clopidogrel (PLAVIX) 75 MG tablet Take 1 tablet (75 mg total) by mouth daily with breakfast.  60 tablet  3  . diazepam (VALIUM) 5 MG tablet Take 5-10 mg by mouth at bedtime as needed for sleep.      Marland Kitchen docusate sodium (COLACE) 100 MG capsule Take 100 mg by mouth 2 (two) times daily.      . Ipratropium-Albuterol (COMBIVENT RESPIMAT) 20-100 MCG/ACT AERS respimat Inhale 1 puff into the lungs every 6 (six) hours as needed for wheezing or shortness of breath.      . lovastatin (MEVACOR) 40 MG tablet Take 1 tablet (40 mg total) by mouth daily.  90 tablet  2  . omeprazole (PRILOSEC) 40 MG capsule Take 1 capsule (40 mg total) by mouth daily.  90 capsule  3  . ondansetron (ZOFRAN) 4 MG/2ML SOLN injection Inject 4 mg into the vein at bedtime. Pt gets through her port.      . ondansetron (ZOFRAN) 8 MG tablet Take 8 mg by mouth every 8 (eight) hours as needed for nausea.      . ONE TOUCH ULTRA TEST test strip USE AS DIRECTED  100 each  0  . oxyCODONE-acetaminophen (PERCOCET/ROXICET) 5-325 MG per tablet Take 1 tablet by mouth every 6 (six) hours as needed for pain.  100 tablet  0  . polyethylene glycol (MIRALAX / GLYCOLAX) packet Take 17 g by mouth  daily.      Marland Kitchen PRESCRIPTION MEDICATION Inject into the vein every 21 ( twenty-one) days. Doxil infusion q3weeks      . PRESCRIPTION MEDICATION at bedtime. Home Paranatal Nutrition from [facility].      . ranitidine (ZANTAC) 150 MG tablet Take 1 tablet (150 mg total) by mouth 2 (two) times daily.  180 tablet  3  . sertraline (ZOLOFT) 100 MG tablet Take 1 tablet (100 mg total) by mouth daily before breakfast.  90 tablet  3  . [DISCONTINUED] busPIRone (BUSPAR) 15 MG tablet Take 1 tablet (15 mg total) by mouth 2 (two) times daily.  180 tablet  1  . [DISCONTINUED] roflumilast (DALIRESP) 500 MCG TABS tablet Take 500 mcg by mouth daily.         No current facility-administered medications for this visit.   OBJECTIVE: Older white woman  in no acute distress Filed Vitals:   02/02/13 1442  BP: 99/63  Pulse: 96  Temp: 98.2 F (36.8 C)  Resp: 20     Body mass index is 21.03 kg/(m^2).    ECOG FS: 2 Filed Weights   02/02/13 1442  Weight: 122 lb 9.6 oz (55.611 kg)   Sclerae unicteric Oropharynx clear I do not palpate any cervical or supraclavicular adenopathy Lungs diminished breath sounds bilaterally in the bases, more so on the right than the left; No wheezes or rhonchi Heart regular rate and rhythm Abdomen soft, mildly tender in the left upper quadrant, without any palpable mass, no obvious ascites by palpation, positive bowel sounds,  MSK no focal spinal tenderness  No peripheral edema Neuro: nonfocal, well oriented, positive affect Breasts: Deferred.  Port is intact in the right upper chest wall.   LAB RESULTS: Lab Results  Component Value Date   WBC 8.0 02/02/2013   NEUTROABS 5.2 02/02/2013   HGB 9.7* 02/02/2013   HCT [name].0* 02/02/2013   MCV 87.5 02/02/2013   PLT 356 02/02/2013      Chemistry  Component Value Date/Time   NA 135* 01/26/2013 1334   NA 134* 01/08/2013 0427   K 4.0 01/26/2013 1334   K 3.8 01/08/2013 0427   CL 97* 01/26/2013 1334   CL 98 01/08/2013 0427   CO2 27  01/26/2013 1334   CO2 28 01/08/2013 0427   BUN 25.7 01/26/2013 1334   BUN 18 01/08/2013 0427   CREATININE 0.7 01/26/2013 1334   CREATININE 0.45* 01/08/2013 0427      Component Value Date/Time   CALCIUM 9.5 01/26/2013 1334   CALCIUM 9.1 01/08/2013 0427   ALKPHOS 115 01/26/2013 1334   ALKPHOS 112 01/07/2013 0407   AST 15 01/26/2013 1334   AST 12 01/07/2013 0407   ALT 17 01/26/2013 1334   ALT 12 01/07/2013 0407   BILITOT 0.22 01/26/2013 1334   BILITOT 0.1* 01/07/2013 0407     CA 125  478.4             01/26/2013    462.6  01/12/2013    475.8  12/15/2012    515.9  12/08/2012    473.0  11/17/2012    599.7  10/27/2012    1066.3  10/06/2012    STUDIES:  Dg Chest 2 View  01/26/2013   *RADIOLOGY REPORT*  Clinical Data: Current history metastatic ovarian cancer, presenting with cough and shortness of breath.  Smoker.  CHEST - 2 VIEW  Comparison: Two-view chest x-ray 12/15/2012, 09/24/2012.  CT chest 07/06/2012.  Findings: Small bilateral pleural effusions, left greater than right, not significantly changed since the examination 12/15/2012. Associated minimal passive atelectasis in the left lower lobe, unchanged.  Lungs hyperinflated but otherwise clear.  Cardiac silhouette normal in size, unchanged.  Thoracic aorta mildly atherosclerotic, unchanged.  Hilar and mediastinal contours otherwise unremarkable.  Surgical clips in the left axilla from prior node dissection.  Right jugular Port-A-Cath tip in the mid SVC, unchanged.  IMPRESSION: Stable small bilateral pleural effusions and associated minimal passive atelectasis in the left lower lobe.  No new abnormalities.   Original Report Authenticated By: Hulan Saas, M.D.     Ct Abdomen Pelvis W Contrast  01/07/2013   *RADIOLOGY REPORT*  Clinical Data: Abdominal pain, distension, nausea, weight loss, constipation, metastatic ovarian cancer  CT ABDOMEN AND PELVIS WITH CONTRAST  Technique:  Multidetector CT imaging of the abdomen and pelvis was performed following  the standard protocol during bolus administration of intravenous contrast.  Contrast: OMNIPAQUE IOHEXOL 300 MG/ML  SOLN  Comparison: 11/02/2012  Findings: Moderate bilateral pleural effusions, new.  Liver, spleen, pancreas, and adrenal glands are within normal limits.  Distended gallbladder, although improved.  No intrahepatic or extrahepatic ductal dilatation.  4 mm nonobstructing left lower pole renal calculus (series 2/image 32).  5 mm right lower pole renal cyst.  No hydronephrosis.  Small volume abdominal ascites, similar.  Peritoneal drainage catheter terminates in the left mid abdomen (series 2/image 54).  3.8 x 6.0 cm fluid collection posterior to the stomach (series 2/image 25), now with a developing thin rim.  Peritoneal carcinomatosis, mildly improved.  For example, omental caking measures 4.0 x 10.2 cm beneath the left anterior abdominal wall (series 2/image 34), previously 5.2 x 10.9 cm.  A 3.6 x 3.5 cm gastrohepatic ligament implant (series 2/image 26) previously measured 4.0 x 3.7 cm.  Additional area of tumor in the lower pelvis is difficult to discretely measure.  No evidence of bowel obstruction. While mildly prominent loops of small bowel are present in the left lower abdomen, contrast opacifies the  proximal colon.  Atherosclerotic calcifications of the abdominal aorta and branch vessels.  Bladder is within normal limits.  Degenerative changes of the visualized thoracolumbar spine.  IMPRESSION: Peritoneal carcinomatosis, mildly improved.  Small volume abdominal ascites with peritoneal drainage catheter, grossly unchanged.  Developing loculated 3.8 x 6.0 cm fluid collection posterior to the stomach.  Moderate bilateral pleural effusions, new.  No evidence of bowel obstruction.  Additional stable ancillary findings as above.   Original Report Authenticated By: Charline Bills, M.D.    ASSESSMENT: 72 y.o.  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 [V323G (1196T>G)]    (1) status post left modified radical mastectomy October 1993 for a T2 N0 invasive ductal breast, estrogen and progesterone receptor positive cancer treated adjuvantly with MFL (methotrexate/fluorouracil/leucovorin) chemotherapy x6, completed April 1994, followed by 5 years of tamoxifen completed April 1999, with no evidence of recurrence  (2) now status post TAH BSO, omentectomy, and suboptimal debulking 10/29/2011 for a high-grade serous adenocarcinoma of the ovary, pT3c NX (Stage IIIC) , s/p eight cycles of carboplatin/paclitaxel completed 05/05/2012 (initial CA125 of 2050.7 normalized after cycle 5)  (3) recurrent disease documented by rising CEA 125 an abdominal CT scan 09/21/2012.  (4) COPD with ongoing tobacco abuse  (5) diabetes mellitus  (6) genetic testing for Lynch syndrome pending.  (7)  status post hospitalization in July 2013 for stroke/small acute infarcts in the left sub insular white matter and coronal radiata.  (8) port in place  (9) left upper lobe 6 mm subpleural nodule, without associated hypermetabolic activity (likely well below the resolution of PET imaging) noted August 2013  (10) CTs obtained in [place] 2014 showed evidence of significant progression of disease with widespread intraperitoneal metastases and a moderate volume of presumably malignant ascites.  (11) treated with Doxil on a Q. three-week basis, first dose given on 10/06/2012, discontinued after 5 cycles, last dose given on 12/29/2012, secondary to hand/foot syndrome and skin changes.  (12) cholecystitis, with cholecystectomy pending  (13)  Hx of recurring ascites, requiring therapeutic paracentesis and Aspira drain, removed on 01/15/13  (14) Malnutrition, on TPN.  Apparent infiltration of TPN noted on 12/15/12, resolved   (15)  Hx of small bowel obstruction requiring hospitalization, 01/06/2013 - 01/08/2013  (16)  initiating treatment with carboplatin to be given every 21  days, first dose on 01/26/2013   PLAN:  Zeynab tolerated her first cycle of carboplatin without event. In particular I am encouraged by her platelet count. She will see [place] again on the 15th for cycle #2. I'm hoping after 3-4 cycles her partial bowel obstruction may improve sufficiently that she can need more normally and we can stop the nightly TNA. Until then, I have strongly recommended that she stick to clear liquids or, if she is going to eat solids, it only very small amounts, perhaps 2 tablespoons maximally, every 2 or 3 hours. She would like a diet consult to discuss this further and I have arranged that for July 15.  Otherwise she knows to call for any problems that may develop before next visit here.  Raelene Trew C    02/02/2013

## 2013-02-16 ENCOUNTER — Ambulatory Visit (HOSPITAL_BASED_OUTPATIENT_CLINIC_OR_DEPARTMENT_OTHER): Payer: Medicare Other

## 2013-02-16 ENCOUNTER — Other Ambulatory Visit (HOSPITAL_BASED_OUTPATIENT_CLINIC_OR_DEPARTMENT_OTHER): Payer: Medicare Other | Admitting: Lab

## 2013-02-16 ENCOUNTER — Ambulatory Visit: Payer: Medicare Other | Admitting: Nutrition

## 2013-02-16 ENCOUNTER — Other Ambulatory Visit: Payer: Self-pay | Admitting: *Deleted

## 2013-02-16 ENCOUNTER — Ambulatory Visit (HOSPITAL_BASED_OUTPATIENT_CLINIC_OR_DEPARTMENT_OTHER): Payer: Medicare Other | Admitting: Physician Assistant

## 2013-02-16 ENCOUNTER — Encounter: Payer: Self-pay | Admitting: Oncology

## 2013-02-16 ENCOUNTER — Encounter: Payer: Self-pay | Admitting: Physician Assistant

## 2013-02-16 VITALS — BP 106/64 | HR 94 | Temp 97.3°F | Resp 20 | Ht 64.0 in | Wt 125.0 lb

## 2013-02-16 DIAGNOSIS — C569 Malignant neoplasm of unspecified ovary: Secondary | ICD-10-CM

## 2013-02-16 DIAGNOSIS — K56600 Partial intestinal obstruction, unspecified as to cause: Secondary | ICD-10-CM

## 2013-02-16 DIAGNOSIS — F419 Anxiety disorder, unspecified: Secondary | ICD-10-CM

## 2013-02-16 DIAGNOSIS — M549 Dorsalgia, unspecified: Secondary | ICD-10-CM

## 2013-02-16 DIAGNOSIS — Z5111 Encounter for antineoplastic chemotherapy: Secondary | ICD-10-CM

## 2013-02-16 DIAGNOSIS — R1012 Left upper quadrant pain: Secondary | ICD-10-CM

## 2013-02-16 DIAGNOSIS — K5900 Constipation, unspecified: Secondary | ICD-10-CM

## 2013-02-16 DIAGNOSIS — Z853 Personal history of malignant neoplasm of breast: Secondary | ICD-10-CM

## 2013-02-16 DIAGNOSIS — D702 Other drug-induced agranulocytosis: Secondary | ICD-10-CM

## 2013-02-16 DIAGNOSIS — C786 Secondary malignant neoplasm of retroperitoneum and peritoneum: Secondary | ICD-10-CM

## 2013-02-16 DIAGNOSIS — J449 Chronic obstructive pulmonary disease, unspecified: Secondary | ICD-10-CM

## 2013-02-16 LAB — COMPREHENSIVE METABOLIC PANEL (CC13)
ALT: 27 U/L (ref 0–55)
Albumin: 2.7 g/dL — ABNORMAL LOW (ref 3.5–5.0)
CO2: 21 mEq/L — ABNORMAL LOW (ref 22–29)
Calcium: 9 mg/dL (ref 8.4–10.4)
Chloride: 104 mEq/L (ref 98–109)
Sodium: 136 mEq/L (ref 136–145)
Total Protein: 7.1 g/dL (ref 6.4–8.3)

## 2013-02-16 LAB — CBC WITH DIFFERENTIAL/PLATELET
Basophils Absolute: 0.1 10*3/uL (ref 0.0–0.1)
Eosinophils Absolute: 0.1 10*3/uL (ref 0.0–0.5)
HGB: 10.2 g/dL — ABNORMAL LOW (ref 11.6–15.9)
MONO#: 0.8 10*3/uL (ref 0.1–0.9)
NEUT#: 5.5 10*3/uL (ref 1.5–6.5)
Platelets: 320 10*3/uL (ref 145–400)
RBC: 3.58 10*6/uL — ABNORMAL LOW (ref 3.70–5.45)
RDW: 17.5 % — ABNORMAL HIGH (ref 11.2–14.5)
WBC: 8.3 10*3/uL (ref 3.9–10.3)
nRBC: 0 % (ref 0–0)

## 2013-02-16 MED ORDER — ONDANSETRON 16 MG/50ML IVPB (CHCC)
16.0000 mg | Freq: Once | INTRAVENOUS | Status: AC
  Administered 2013-02-16: 16 mg via INTRAVENOUS

## 2013-02-16 MED ORDER — SODIUM CHLORIDE 0.9 % IJ SOLN
10.0000 mL | INTRAMUSCULAR | Status: DC | PRN
  Administered 2013-02-16 (×2): 10 mL
  Filled 2013-02-16: qty 10

## 2013-02-16 MED ORDER — CARBOPLATIN CHEMO INTRADERMAL TEST DOSE 100MCG/0.02ML
100.0000 ug | Freq: Once | INTRADERMAL | Status: AC
  Administered 2013-02-16: 100 ug via INTRADERMAL
  Filled 2013-02-16: qty 0.01

## 2013-02-16 MED ORDER — ALPRAZOLAM 0.5 MG PO TABS: ORAL_TABLET | ORAL | Status: DC

## 2013-02-16 MED ORDER — UNABLE TO FIND: 30.0000 [IU] | Freq: Every evening | Status: AC

## 2013-02-16 MED ORDER — HEPARIN SOD (PORK) LOCK FLUSH 100 UNIT/ML IV SOLN
500.0000 [IU] | Freq: Once | INTRAVENOUS | Status: AC | PRN
  Administered 2013-02-16: 500 [IU]
  Filled 2013-02-16: qty 5

## 2013-02-16 MED ORDER — SODIUM CHLORIDE 0.9 % IV SOLN
460.0000 mg | Freq: Once | INTRAVENOUS | Status: AC
  Administered 2013-02-16: 460 mg via INTRAVENOUS
  Filled 2013-02-16: qty 46

## 2013-02-16 MED ORDER — SODIUM CHLORIDE 0.9 % IJ SOLN
10.0000 mL | INTRAMUSCULAR | Status: DC | PRN
  Filled 2013-02-16: qty 10

## 2013-02-16 MED ORDER — SODIUM CHLORIDE 0.9 % IV SOLN
Freq: Once | INTRAVENOUS | Status: AC
  Administered 2013-02-16: 12:00:00 via INTRAVENOUS

## 2013-02-16 MED ORDER — DEXAMETHASONE SODIUM PHOSPHATE 20 MG/5ML IJ SOLN
20.0000 mg | Freq: Once | INTRAMUSCULAR | Status: AC
  Administered 2013-02-16: 20 mg via INTRAVENOUS

## 2013-02-16 NOTE — Patient Instructions (Addendum)
[facility] Discharge Instructions for Patients Receiving Chemotherapy  Today you received the following chemotherapy agents CARBOPLATIN  To help prevent nausea and vomiting after your treatment, we encourage you to take your nausea medication as directed if needed.   If you develop nausea and vomiting that is not controlled by your nausea medication, call the clinic.   BELOW ARE SYMPTOMS THAT SHOULD BE REPORTED IMMEDIATELY:  *FEVER GREATER THAN 100.5 F  *CHILLS WITH OR WITHOUT FEVER  NAUSEA AND VOMITING THAT IS NOT CONTROLLED WITH YOUR NAUSEA MEDICATION  *UNUSUAL SHORTNESS OF BREATH  *UNUSUAL BRUISING OR BLEEDING  TENDERNESS IN MOUTH AND THROAT WITH OR WITHOUT PRESENCE OF ULCERS  *URINARY PROBLEMS  *BOWEL PROBLEMS  UNUSUAL RASH Items with * indicate a potential emergency and should be followed up as soon as possible.  Feel free to call the clinic you have any questions or concerns. The clinic phone number is [phone].

## 2013-02-16 NOTE — Progress Notes (Signed)
ID: [name] [name]   DOB: July 19, 1941  MR#: [id number]  CSN#:[id number]  PCP:  GYN: Laurette Schimke, MD SU: [name] OTHER MD:  HISTORY OF PRESENT ILLNESS: [name] [name] is a 72 year-old [place] woman I followed remotely for a history of breast cancer. More recently she noted that her belly was getting "bigger". Gradually she has developed increasing low back pain. After a period of several months, as her symptoms increased, she brought this problem to her primary physician's attention and a KUB was obtained on 10/07/2011, which was unremarkable. This was followed by a CT scan 10/15/2011, which showed ascites and omental caking. Paracentesis 10/18/2011 showed ([id number]) malignant cells consistent with metastatic adenocarcinoma, with equivocal staining for estrogen receptor. The pattern of additional stains was most suggestive of a primary gynecologic tumor. CA 125 at this time was 2051.  The patient was referred to gynecologic oncology and on 10/29/2011 underwent exploratory laparotomy under Drs. Cleda Mccreedy and Antionette Char. This consisted of a total abdominal hysterectomy with bilateral salpingo-oophorectomy, omentectomy, and radical tumor debulking. Unfortunately there was significant tumor attached to the diaphragm so the patient was suboptimally debulked. Accordingly a peritoneal catheter was not placed. GYN-ONC recommended 6 cycles of carboplatin/paclitaxel given every 3 weeks, with Neulasta on day 2 for granulocyte support. Vicke's subsequent history is as detailed below.  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 She is due for day 1 cycle 2 of every three-week carboplatin which she is tolerating very well.  Meliss is functional status is improving. Although she is walking slowly, she is not in a wheelchair today. She continues to have some chronic lower back pain and abdominal pain, primarily  in the left upper quadrant. She finds relief with Percocet. She's having soft bowel movements. She denies any problems with nausea or emesis. She continues to receive nightly TNA.  REVIEW OF SYSTEMS:  Fabiola denies any fevers or chills. She's had no skin changes and denies any signs of abnormal bleeding. Her energy level is fair. She does get tired easily, and has shortness of breath with exertion. She denies any cough, phlegm production, or chest pain. She's had no abnormal headaches or dizziness.  A detailed review of systems is otherwise noncontributory.   PAST MEDICAL HISTORY: Past Medical History  Diagnosis Date  . History of breast cancer   . COPD (chronic obstructive pulmonary disease)   . GERD (gastroesophageal reflux disease)   . Diabetes mellitus     type II  . Hx of colonic polyps   . Osteoarthritis   . MVP (mitral valve prolapse)   . Dysrhythmia     pt states runs a little fast   . Shortness of breath     with exertion   . Depression   . Anxiety   . Heart murmur   . Stroke   . Ascites 10/22/2012  . metastases dx'd 10/2011  . Ovarian cancer dx'd2/2013    active chemotherapy  . Breast CA 1993  . Partial small bowel obstruction 01/2013    PAST SURGICAL HISTORY: Past Surgical History  Procedure Laterality Date  . Cesarean section  1980, 1982    X 2   . Laparoscopy      work up for infertility  . Tympanoplasty      left X 2  . Left breast fibroadenoma removal  1960's  . Breast cyst aspirations    . Left modified radial mastectomy      >  20 years ago  . Laparotomy  10/29/2011    Procedure: EXPLORATORY LAPAROTOMY;  Surgeon: Rejeana Brock A. Duard Brady, MD;  Location: [facility];  Service: Gynecology;  Laterality: N/A;  . Abdominal hysterectomy  10/29/2011    Procedure: HYSTERECTOMY ABDOMINAL;  Surgeon: Rejeana Brock A. Duard Brady, MD;  Location: [facility];  Service: Gynecology;  Laterality: N/A;  Total abdominal hysterectomy  . Salpingoophorectomy  10/29/2011    Procedure: SALPINGO OOPHERECTOMY;   Surgeon: Rejeana Brock A. Duard Brady, MD;  Location: [facility];  Service: Gynecology;  Laterality: Bilateral;    FAMILY HISTORY Family History  Problem Relation Age of Onset  . Cancer Mother     endometrial/ colon  . Cancer Brother     colon  . COPD Other   . Hypertension Other   . Cancer Maternal Uncle     prostate/bladder/lung  . Cancer Paternal Aunt     unknown abdominal cancer  . Cancer Maternal Grandmother     Salivary gland cancer  . Cancer Paternal Aunt     leukemia   the patient's father died at the age of 19. The patient's mother died at the age of 64; she had a history of colon cancer and endometrial cancer. The patient has no sisters. She has one brother, with a history of colon cancer. There is no history of breast cancer in the family. The patient is scheduled for genetic testing later this month.  GYNECOLOGIC HISTORY: She had menarche age 52. First pregnancy to term was at age 43. She is GX P2. She became menopausal at the time of her chemotherapy for breast cancer. This was age 53. 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 Her husband [name] runs a Psychologist, sport and exercise out of their own home. Daughter [name] [name], 48,  currently lives at home with the patient. There are some issues relating to this daughter that the patient discussed briefly. [place]. [name] [name], 30, is an ED nurse at [employer]. The patient has no grandchildren. She is not a Advice worker.   ADVANCED DIRECTIVES: in place  HEALTH MAINTENANCE: History  Substance Use Topics  . Smoking status: Current Some Day Smoker -- 0.50 packs/day for 30 years    Types: Cigarettes  . Smokeless tobacco: Never Used     Comment: quit again 2009; restarted 1 ppd 2011  . Alcohol Use: No     Colonoscopy: 2009  PAP: s/p hysterectomy  Bone density: 2010. "good"  Mammography: 2012 NOV--unremarkable  Allergies  Allergen Reactions  . Promethazine     "Makes me feel like  I want to jump out of my skin"    Prior to Admission medications   Medication Sig Start Date End Date Taking? Authorizing Provider  albuterol (PROVENTIL) (2.5 MG/3ML) 0.083% nebulizer solution Take 2.5 mg by nebulization every 6 (six) hours as needed for wheezing.   Yes Historical Provider, MD  ALPRAZolam Prudy Feeler) 0.5 MG tablet Take 1 tablet (0.5 mg total) by mouth 2 (two) times daily as needed for sleep. 01/19/13  Yes Tylerjames Hoglund Allegra Grana, PA-C  atenolol (TENORMIN) 25 MG tablet Take 12.5 mg by mouth 2 (two) times daily.   Yes Historical Provider, MD  budesonide-formoterol (SYMBICORT) 160-4.5 MCG/ACT inhaler Inhale 2 puffs into the lungs 2 (two) times daily.   Yes Historical Provider, MD  buPROPion (WELLBUTRIN SR) 150 MG 12 hr tablet Take 1 tablet (150 mg total) by mouth 2 (two) times daily. 07/17/12 07/17/13 Yes Georgina Quint Plotnikov, MD  busPIRone (BUSPAR) 15  MG tablet Take 1 tablet (15 mg total) by mouth 2 (two) times daily. 07/01/12 07/01/13 Yes Georgina Quint Plotnikov, MD  clopidogrel (PLAVIX) 75 MG tablet Take 1 tablet (75 mg total) by mouth daily with breakfast. 10/02/12 10/02/13 Yes Georgina Quint Plotnikov, MD  diazepam (VALIUM) 5 MG tablet Take 5-10 mg by mouth at bedtime as needed for sleep.   Yes Historical Provider, MD  docusate sodium (COLACE) 100 MG capsule Take 100 mg by mouth 2 (two) times daily.   Yes Historical Provider, MD  Ipratropium-Albuterol (COMBIVENT RESPIMAT) 20-100 MCG/ACT AERS respimat Inhale 1 puff into the lungs every 6 (six) hours as needed for wheezing or shortness of breath.   Yes Historical Provider, MD  lovastatin (MEVACOR) 40 MG tablet Take 1 tablet (40 mg total) by mouth daily. 06/15/12  Yes Georgina Quint Plotnikov, MD  omeprazole (PRILOSEC) 40 MG capsule Take 1 capsule (40 mg total) by mouth daily. 07/08/12 07/08/13 Yes Georgina Quint Plotnikov, MD  ondansetron (ZOFRAN) 4 MG/2ML SOLN injection Inject 4 mg into the vein at bedtime. Pt gets through her port.   Yes Historical Provider, MD  ONE  TOUCH ULTRA TEST test strip USE AS DIRECTED 11/18/12  Yes Tresa Garter, MD  oxyCODONE-acetaminophen (PERCOCET/ROXICET) 5-325 MG per tablet Take 1 tablet by mouth every 6 (six) hours as needed for pain. 02/02/13  Yes Lowella Dell, MD  polyethylene glycol (MIRALAX / GLYCOLAX) packet Take 17 g by mouth daily.   Yes Historical Provider, MD  PRESCRIPTION MEDICATION at bedtime. Home Paranatal Nutrition from [facility].   Yes Historical Provider, MD  ranitidine (ZANTAC) 150 MG tablet Take 1 tablet (150 mg total) by mouth 2 (two) times daily. 12/23/12 12/23/13 Yes Georgina Quint Plotnikov, MD  sertraline (ZOLOFT) 100 MG tablet Take 1 tablet (100 mg total) by mouth daily before breakfast. 09/22/12  Yes Georgina Quint Plotnikov, MD  UNABLE TO FIND Inject 30 Units into the vein Nightly. Med Name:  INSULIN IN TPN NIGHTLY   Yes Historical Provider, MD    OBJECTIVE: Older white woman  in no acute distress Filed Vitals:   02/16/13 1036  BP: 106/64  Pulse: 94  Temp: 97.3 F (36.3 C)  Resp: 20     Body mass index is 21.45 kg/(m^2).    ECOG FS: 2 Filed Weights   02/16/13 1036  Weight: 125 lb (56.7 kg)   Sclerae unicteric Oropharynx clear, no ulcerations or evidence of thrush. Buccal mucosa is slightly dry. No palpable cervical or supraclavicular adenopathy Lungs: diminished breath sounds bilaterally in the bases, more so on the right than the left, stable; No wheezes or rhonchi Heart regular rate and rhythm Abdomen soft, mildly tender in the left upper quadrant, without any palpable mass, no obvious ascites by palpation, positive bowel sounds,  MSK no focal spinal tenderness  No peripheral edema Neuro: nonfocal, well oriented, positive affect Breasts: Deferred. No axillary adenopathy. Port is intact in the right upper chest wall.   LAB RESULTS: Lab Results  Component Value Date   WBC 8.3 02/16/2013   NEUTROABS 5.5 02/16/2013   HGB 10.2* 02/16/2013   HCT 32.1* 02/16/2013   MCV 89.7 02/16/2013    PLT 320 02/16/2013      Chemistry      Component Value Date/Time   NA 136 02/16/2013 1020   NA 134* 01/08/2013 0427   K 4.4 02/16/2013 1020   K 3.8 01/08/2013 0427   CL 97* 01/26/2013 1334   CL 98 01/08/2013 0427  CO2 21* 02/16/2013 1020   CO2 28 01/08/2013 0427   BUN 27.3* 02/16/2013 1020   BUN 18 01/08/2013 0427   CREATININE 0.7 02/16/2013 1020   CREATININE 0.45* 01/08/2013 0427      Component Value Date/Time   CALCIUM 9.0 02/16/2013 1020   CALCIUM 9.1 01/08/2013 0427   ALKPHOS 138 02/16/2013 1020   ALKPHOS 112 01/07/2013 0407   AST 29 02/16/2013 1020   AST 12 01/07/2013 0407   ALT 27 02/16/2013 1020   ALT 12 01/07/2013 0407   BILITOT <0.20 Repeated and Verified 02/16/2013 1020   BILITOT 0.1* 01/07/2013 0407     CA 125  Pending  02/16/2013    478.4             01/26/2013    462.6  01/12/2013    475.8  12/15/2012    515.9  12/08/2012    473.0  11/17/2012    599.7  10/27/2012    1066.3  10/06/2012    STUDIES:   Ct Abdomen Pelvis W Contrast  01/07/2013   *RADIOLOGY REPORT*  Clinical Data: Abdominal pain, distension, nausea, weight loss, constipation, metastatic ovarian cancer  CT ABDOMEN AND PELVIS WITH CONTRAST  Technique:  Multidetector CT imaging of the abdomen and pelvis was performed following the standard protocol during bolus administration of intravenous contrast.  Contrast: OMNIPAQUE IOHEXOL 300 MG/ML  SOLN  Comparison: 11/02/2012  Findings: Moderate bilateral pleural effusions, new.  Liver, spleen, pancreas, and adrenal glands are within normal limits.  Distended gallbladder, although improved.  No intrahepatic or extrahepatic ductal dilatation.  4 mm nonobstructing left lower pole renal calculus (series 2/image 32).  5 mm right lower pole renal cyst.  No hydronephrosis.  Small volume abdominal ascites, similar.  Peritoneal drainage catheter terminates in the left mid abdomen (series 2/image 54).  3.8 x 6.0 cm fluid collection posterior to the stomach (series 2/image 25), now with a  developing thin rim.  Peritoneal carcinomatosis, mildly improved.  For example, omental caking measures 4.0 x 10.2 cm beneath the left anterior abdominal wall (series 2/image 34), previously 5.2 x 10.9 cm.  A 3.6 x 3.5 cm gastrohepatic ligament implant (series 2/image 26) previously measured 4.0 x 3.7 cm.  Additional area of tumor in the lower pelvis is difficult to discretely measure.  No evidence of bowel obstruction. While mildly prominent loops of small bowel are present in the left lower abdomen, contrast opacifies the proximal colon.  Atherosclerotic calcifications of the abdominal aorta and branch vessels.  Bladder is within normal limits.  Degenerative changes of the visualized thoracolumbar spine.  IMPRESSION: Peritoneal carcinomatosis, mildly improved.  Small volume abdominal ascites with peritoneal drainage catheter, grossly unchanged.  Developing loculated 3.8 x 6.0 cm fluid collection posterior to the stomach.  Moderate bilateral pleural effusions, new.  No evidence of bowel obstruction.  Additional stable ancillary findings as above.   Original Report Authenticated By: Charline Bills, M.D.    ASSESSMENT: 72 y.o.  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)    (1) status post left modified radical mastectomy October 1993 for a T2 N0 invasive ductal breast, estrogen and progesterone receptor positive cancer treated adjuvantly with MFL (methotrexate/fluorouracil/leucovorin) chemotherapy x6, completed April 1994, followed by 5 years of tamoxifen completed April 1999, with no evidence of recurrence  (2) now status post TAH BSO, omentectomy, and suboptimal debulking 10/29/2011 for a high-grade serous adenocarcinoma of the ovary, pT3c NX (Stage IIIC) , s/p eight  cycles of carboplatin/paclitaxel completed 05/05/2012 (initial CA125 of 2050.7 normalized after cycle 5)  (3) recurrent disease documented by rising CEA 125 an abdominal CT scan  09/21/2012.  (4) COPD with ongoing tobacco abuse  (5) diabetes mellitus  (6) genetic testing for Lynch syndrome pending.  (7)  status post hospitalization in July 2013 for stroke/small acute infarcts in the left sub insular white matter and coronal radiata.  (8) port in place  (9) left upper lobe 6 mm subpleural nodule, without associated hypermetabolic activity (likely well below the resolution of PET imaging) noted August 2013  (10) CTs obtained in [place] 2014 showed evidence of significant progression of disease with widespread intraperitoneal metastases and a moderate volume of presumably malignant ascites.  (11) treated with Doxil on a Q. three-week basis, first dose given on 10/06/2012, discontinued after 5 cycles, last dose given on 12/29/2012, secondary to hand/foot syndrome and skin changes.  (12) cholecystitis, with cholecystectomy pending  (13)  Hx of recurring ascites, requiring therapeutic paracentesis and Aspira drain, removed on 01/15/13  (14) Malnutrition, on TPN.  Apparent infiltration of TPN noted on 12/15/12, resolved   (15)  Hx of small bowel obstruction requiring hospitalization, 01/06/2013 - 01/08/2013  (16)  carboplatin to be given every 21 days, first dose on 01/26/2013   PLAN:  Malley will receive her second q. three-week dose of carboplatin today as scheduled. She will see Dr. Darnelle Catalan for followup next week on July 22, and is already scheduled to see Dr. Nelly Rout for followup in early August. She is scheduled for chemotherapy through early September.  Loistine Simas and [place] both voice understanding and agreement with our plan. She will call with any changes or problems.  Cylis Ayars    02/16/2013

## 2013-02-16 NOTE — Progress Notes (Signed)
This is a 72 year old female diagnosed with metastatic ovarian cancer, patient of Dr. Darnelle Catalan.   Past medical history includes COPD, GERD, diabetes, anxiety, depression, stroke, ascites, breast cancer  Medications include colace, Prilosec, Zofran, Miralax  Labs: 7/15 Glucose 157, BUN 27.3  Height: 64 inches Weight: 125 pounds (July 15) Usual Body Weight: 130-135 pounds (March 2013) BMI: 21.5  Estimated Nutrition Needs: 1550-1650 kcal 73-85 g protein >1.8 L  Patient presents today for nutrition consult during chemotherapy. She was hospitalized in March 2014 with a small bowel obstruction and discharged with home TPN. I was unable to find specific order from [facility], but per patient, this runs for 12 hours overnight. Per last hospital admission, TPN provides 84 g protein (100% estimated needs) and 252 g of dextrose, providing 1193 kcal (77% estimated needs). She reports that [facility] just added lipids to TPN to increase calories. She has gained 3 pounds in 2 weeks. Patient complains that she feels hungry, but gets full after eating only a small amount. She eats about 3 times daily, and reports drinking a [employer] in the morning. She states that she would like to learn more about how to increase calories in oral diet.   Note: enteral nutrition recommended by inpatient RD due to patient having bowel movements, but patient refuses.   Nutrition diagnosis: Altered GI function related to partial bowel obstruction as evidenced by home TPN.   Intervention: Patient educated on strategies for increasing calories and protein by eating every 2-3 hours during the day and including high calorie, high protein foods. She was encouraged to continue using nutrition supplements to improve intake. Teach back method used.  Monitoring, evaluation, goals: Patient will tolerate increased protein and calories to promote weight stabilization.   Next visit: To be scheduled during next  chemotherapy treatment.

## 2013-02-17 ENCOUNTER — Telehealth: Payer: Self-pay | Admitting: *Deleted

## 2013-02-17 NOTE — Telephone Encounter (Signed)
Per staff message and POF I have scheduled appts.  JMW  

## 2013-02-18 ENCOUNTER — Other Ambulatory Visit: Payer: Self-pay | Admitting: *Deleted

## 2013-02-18 ENCOUNTER — Other Ambulatory Visit: Payer: Self-pay | Admitting: Oncology

## 2013-02-18 ENCOUNTER — Ambulatory Visit (HOSPITAL_COMMUNITY)
Admission: RE | Admit: 2013-02-18 | Discharge: 2013-02-18 | Disposition: A | Discharge status: Home / Self Care | Payer: Medicare Other | Source: Ambulatory Visit | Attending: Oncology | Admitting: Oncology

## 2013-02-18 ENCOUNTER — Encounter (HOSPITAL_COMMUNITY): Payer: Self-pay | Admitting: General Practice

## 2013-02-18 ENCOUNTER — Other Ambulatory Visit: Payer: Self-pay | Admitting: Medical Oncology

## 2013-02-18 ENCOUNTER — Ambulatory Visit (HOSPITAL_BASED_OUTPATIENT_CLINIC_OR_DEPARTMENT_OTHER): Payer: Medicare Other

## 2013-02-18 ENCOUNTER — Inpatient Hospital Stay (HOSPITAL_COMMUNITY)
Admission: AD | Admit: 2013-02-18 | Discharge: 2013-03-02 | DRG: 356 | Disposition: A | Discharge status: Home Health | Payer: Medicare Other | Source: Ambulatory Visit | Attending: Oncology | Admitting: Oncology

## 2013-02-18 VITALS — BP 122/63 | HR 104 | Temp 97.8°F | Resp 18

## 2013-02-18 DIAGNOSIS — C569 Malignant neoplasm of unspecified ovary: Secondary | ICD-10-CM

## 2013-02-18 DIAGNOSIS — R011 Cardiac murmur, unspecified: Secondary | ICD-10-CM | POA: Diagnosis present

## 2013-02-18 DIAGNOSIS — K5669 Other intestinal obstruction: Principal | ICD-10-CM | POA: Diagnosis present

## 2013-02-18 DIAGNOSIS — Z66 Do not resuscitate: Secondary | ICD-10-CM | POA: Diagnosis present

## 2013-02-18 DIAGNOSIS — Z1501 Genetic susceptibility to malignant neoplasm of breast: Secondary | ICD-10-CM

## 2013-02-18 DIAGNOSIS — J449 Chronic obstructive pulmonary disease, unspecified: Secondary | ICD-10-CM | POA: Diagnosis present

## 2013-02-18 DIAGNOSIS — R141 Gas pain: Secondary | ICD-10-CM | POA: Diagnosis present

## 2013-02-18 DIAGNOSIS — I059 Rheumatic mitral valve disease, unspecified: Secondary | ICD-10-CM | POA: Diagnosis present

## 2013-02-18 DIAGNOSIS — K819 Cholecystitis, unspecified: Secondary | ICD-10-CM | POA: Diagnosis present

## 2013-02-18 DIAGNOSIS — R112 Nausea with vomiting, unspecified: Secondary | ICD-10-CM

## 2013-02-18 DIAGNOSIS — K56600 Partial intestinal obstruction, unspecified as to cause: Secondary | ICD-10-CM

## 2013-02-18 DIAGNOSIS — Z7902 Long term (current) use of antithrombotics/antiplatelets: Secondary | ICD-10-CM

## 2013-02-18 DIAGNOSIS — Z853 Personal history of malignant neoplasm of breast: Secondary | ICD-10-CM

## 2013-02-18 DIAGNOSIS — R140 Abdominal distension (gaseous): Secondary | ICD-10-CM

## 2013-02-18 DIAGNOSIS — R109 Unspecified abdominal pain: Secondary | ICD-10-CM | POA: Insufficient documentation

## 2013-02-18 DIAGNOSIS — K5900 Constipation, unspecified: Secondary | ICD-10-CM | POA: Diagnosis present

## 2013-02-18 DIAGNOSIS — R1909 Other intra-abdominal and pelvic swelling, mass and lump: Secondary | ICD-10-CM | POA: Diagnosis present

## 2013-02-18 DIAGNOSIS — R142 Eructation: Secondary | ICD-10-CM | POA: Diagnosis present

## 2013-02-18 DIAGNOSIS — F411 Generalized anxiety disorder: Secondary | ICD-10-CM | POA: Diagnosis present

## 2013-02-18 DIAGNOSIS — Z79899 Other long term (current) drug therapy: Secondary | ICD-10-CM

## 2013-02-18 DIAGNOSIS — F32A Depression, unspecified: Secondary | ICD-10-CM

## 2013-02-18 DIAGNOSIS — R1012 Left upper quadrant pain: Secondary | ICD-10-CM | POA: Diagnosis present

## 2013-02-18 DIAGNOSIS — F329 Major depressive disorder, single episode, unspecified: Secondary | ICD-10-CM | POA: Diagnosis present

## 2013-02-18 DIAGNOSIS — R911 Solitary pulmonary nodule: Secondary | ICD-10-CM | POA: Diagnosis present

## 2013-02-18 DIAGNOSIS — R180 Malignant ascites: Secondary | ICD-10-CM | POA: Diagnosis present

## 2013-02-18 DIAGNOSIS — M1990 Unspecified osteoarthritis, unspecified site: Secondary | ICD-10-CM | POA: Diagnosis present

## 2013-02-18 DIAGNOSIS — F3289 Other specified depressive episodes: Secondary | ICD-10-CM | POA: Diagnosis present

## 2013-02-18 DIAGNOSIS — E43 Unspecified severe protein-calorie malnutrition: Secondary | ICD-10-CM | POA: Diagnosis present

## 2013-02-18 DIAGNOSIS — C786 Secondary malignant neoplasm of retroperitoneum and peritoneum: Secondary | ICD-10-CM | POA: Diagnosis present

## 2013-02-18 DIAGNOSIS — IMO0002 Reserved for concepts with insufficient information to code with codable children: Secondary | ICD-10-CM

## 2013-02-18 DIAGNOSIS — Z8673 Personal history of transient ischemic attack (TIA), and cerebral infarction without residual deficits: Secondary | ICD-10-CM

## 2013-02-18 DIAGNOSIS — Z87891 Personal history of nicotine dependence: Secondary | ICD-10-CM

## 2013-02-18 DIAGNOSIS — E119 Type 2 diabetes mellitus without complications: Secondary | ICD-10-CM

## 2013-02-18 DIAGNOSIS — E785 Hyperlipidemia, unspecified: Secondary | ICD-10-CM

## 2013-02-18 DIAGNOSIS — K219 Gastro-esophageal reflux disease without esophagitis: Secondary | ICD-10-CM

## 2013-02-18 DIAGNOSIS — J4489 Other specified chronic obstructive pulmonary disease: Secondary | ICD-10-CM

## 2013-02-18 DIAGNOSIS — K668 Other specified disorders of peritoneum: Secondary | ICD-10-CM

## 2013-02-18 DIAGNOSIS — Z90710 Acquired absence of both cervix and uterus: Secondary | ICD-10-CM

## 2013-02-18 DIAGNOSIS — F17200 Nicotine dependence, unspecified, uncomplicated: Secondary | ICD-10-CM

## 2013-02-18 LAB — CBC WITH DIFFERENTIAL/PLATELET
Basophils Relative: 0 % (ref 0–1)
Eosinophils Absolute: 0 10*3/uL (ref 0.0–0.7)
Eosinophils Relative: 0 % (ref 0–5)
Hemoglobin: 10 g/dL — ABNORMAL LOW (ref 12.0–15.0)
Lymphs Abs: 1.4 10*3/uL (ref 0.7–4.0)
MCH: 28.7 pg (ref 26.0–34.0)
MCHC: 32.5 g/dL (ref 30.0–36.0)
MCV: 88.3 fL (ref 78.0–100.0)
Monocytes Relative: 10 % (ref 3–12)
Neutrophils Relative %: 75 % (ref 43–77)
Platelets: 366 10*3/uL (ref 150–400)
RBC: 3.49 MIL/uL — ABNORMAL LOW (ref 3.87–5.11)

## 2013-02-18 LAB — COMPREHENSIVE METABOLIC PANEL
ALT: 16 U/L (ref 0–35)
AST: 18 U/L (ref 0–37)
Albumin: 2.7 g/dL — ABNORMAL LOW (ref 3.5–5.2)
CO2: 29 mEq/L (ref 19–32)
Chloride: 101 mEq/L (ref 96–112)
Creatinine, Ser: 0.51 mg/dL (ref 0.50–1.10)
GFR calc non Af Amer: >90 mL/min (ref >90)
Sodium: 138 mEq/L (ref 135–145)
Total Bilirubin: 0.2 mg/dL — ABNORMAL LOW (ref 0.3–1.2)

## 2013-02-18 MED ORDER — NON FORMULARY: Status: DC

## 2013-02-18 MED ORDER — ONDANSETRON HCL 4 MG/2ML IJ SOLN
4.0000 mg | Freq: Every day | INTRAMUSCULAR | Status: DC
  Administered 2013-02-18 – 2013-02-26 (×9): 4 mg via INTRAVENOUS
  Filled 2013-02-18 (×9): qty 2

## 2013-02-18 MED ORDER — ALBUTEROL SULFATE (5 MG/ML) 0.5% IN NEBU
2.5000 mg | INHALATION_SOLUTION | Freq: Four times a day (QID) | RESPIRATORY_TRACT | Status: DC
  Administered 2013-02-18 – 2013-02-22 (×13): 2.5 mg via RESPIRATORY_TRACT
  Filled 2013-02-18 (×15): qty 0.5

## 2013-02-18 MED ORDER — POTASSIUM CHLORIDE 10 MEQ/100ML IV SOLN
10.0000 meq | INTRAVENOUS | Status: AC
  Administered 2013-02-18 (×2): 10 meq via INTRAVENOUS
  Filled 2013-02-18 (×2): qty 100

## 2013-02-18 MED ORDER — ALPRAZOLAM 0.5 MG PO TABS
0.5000 mg | ORAL_TABLET | Freq: Two times a day (BID) | ORAL | Status: DC | PRN
  Administered 2013-02-19 – 2013-02-28 (×9): 0.5 mg via ORAL
  Filled 2013-02-18 (×9): qty 1

## 2013-02-18 MED ORDER — SODIUM CHLORIDE 0.9 % IV SOLN
INTRAVENOUS | Status: DC
  Administered 2013-02-19: 100 mL/h via INTRAVENOUS
  Administered 2013-02-19 – 2013-02-23 (×6): via INTRAVENOUS
  Administered 2013-02-25: 20 mL/h via INTRAVENOUS

## 2013-02-18 MED ORDER — NONFORMULARY OR COMPOUNDED ITEM
Status: DC
  Administered 2013-02-18: 20:00:00 via INTRAMUSCULAR

## 2013-02-18 MED ORDER — DIAZEPAM 5 MG PO TABS
5.0000 mg | ORAL_TABLET | Freq: Two times a day (BID) | ORAL | Status: DC | PRN
  Administered 2013-02-18 – 2013-02-27 (×4): 10 mg via ORAL
  Filled 2013-02-18 (×4): qty 2

## 2013-02-18 MED ORDER — SODIUM CHLORIDE 0.9 % IV SOLN
INTRAVENOUS | Status: DC
  Administered 2013-02-18: 16:00:00 via INTRAVENOUS

## 2013-02-18 MED ORDER — BUDESONIDE-FORMOTEROL FUMARATE 160-4.5 MCG/ACT IN AERO
2.0000 | INHALATION_SPRAY | Freq: Two times a day (BID) | RESPIRATORY_TRACT | Status: DC
  Administered 2013-02-18 – 2013-03-02 (×24): 2 via RESPIRATORY_TRACT
  Filled 2013-02-18 (×2): qty 6

## 2013-02-18 MED ORDER — IPRATROPIUM-ALBUTEROL 20-100 MCG/ACT IN AERS
1.0000 | INHALATION_SPRAY | Freq: Four times a day (QID) | RESPIRATORY_TRACT | Status: DC | PRN
  Administered 2013-02-21: 1 via RESPIRATORY_TRACT
  Filled 2013-02-18: qty 4

## 2013-02-18 MED ORDER — BUPROPION HCL ER (SR) 150 MG PO TB12
150.0000 mg | ORAL_TABLET | Freq: Two times a day (BID) | ORAL | Status: DC
  Administered 2013-02-18 – 2013-02-28 (×13): 150 mg via ORAL
  Filled 2013-02-18 (×25): qty 1

## 2013-02-18 MED ORDER — PHENOL 1.4 % MT LIQD
1.0000 | OROMUCOSAL | Status: DC | PRN
  Administered 2013-02-18 – 2013-02-19 (×2): 1 via OROMUCOSAL
  Filled 2013-02-18 (×2): qty 177

## 2013-02-18 MED ORDER — BUSPIRONE HCL 15 MG PO TABS
15.0000 mg | ORAL_TABLET | Freq: Two times a day (BID) | ORAL | Status: DC
  Administered 2013-02-18 – 2013-02-28 (×18): 15 mg via ORAL
  Filled 2013-02-18 (×25): qty 1

## 2013-02-18 MED ORDER — PROCHLORPERAZINE EDISYLATE 5 MG/ML IJ SOLN
10.0000 mg | Freq: Once | INTRAMUSCULAR | Status: AC
  Administered 2013-02-18: 10 mg via INTRAVENOUS

## 2013-02-18 MED ORDER — ALBUTEROL SULFATE (5 MG/ML) 0.5% IN NEBU: 2.5000 mg | INHALATION_SOLUTION | RESPIRATORY_TRACT | Status: DC | PRN

## 2013-02-18 MED ORDER — PROCHLORPERAZINE EDISYLATE 5 MG/ML IJ SOLN
10.0000 mg | Freq: Four times a day (QID) | INTRAMUSCULAR | Status: DC | PRN
  Administered 2013-02-18 – 2013-02-25 (×11): 10 mg via INTRAVENOUS
  Filled 2013-02-18 (×11): qty 2

## 2013-02-18 MED ORDER — ATENOLOL 12.5 MG HALF TABLET
12.5000 mg | ORAL_TABLET | Freq: Two times a day (BID) | ORAL | Status: DC
  Administered 2013-02-18 – 2013-02-28 (×18): 12.5 mg via ORAL
  Filled 2013-02-18 (×23): qty 1

## 2013-02-18 MED ORDER — ENOXAPARIN SODIUM 40 MG/0.4ML ~~LOC~~ SOLN
40.0000 mg | SUBCUTANEOUS | Status: DC
  Administered 2013-02-18 – 2013-02-23 (×6): 40 mg via SUBCUTANEOUS
  Filled 2013-02-18 (×8): qty 0.4

## 2013-02-18 MED ORDER — SERTRALINE HCL 100 MG PO TABS
100.0000 mg | ORAL_TABLET | Freq: Every day | ORAL | Status: DC
  Administered 2013-02-19 – 2013-02-28 (×9): 100 mg via ORAL
  Filled 2013-02-18 (×14): qty 1

## 2013-02-18 MED ORDER — SODIUM CHLORIDE 0.9 % IV SOLN
Freq: Once | INTRAVENOUS | Status: DC
  Administered 2013-02-18: 13:00:00 via INTRAVENOUS

## 2013-02-18 MED ORDER — MORPHINE SULFATE 2 MG/ML IJ SOLN
2.0000 mg | INTRAMUSCULAR | Status: DC | PRN
  Administered 2013-02-18 – 2013-02-19 (×5): 2 mg via INTRAVENOUS
  Filled 2013-02-18 (×5): qty 1

## 2013-02-18 MED ORDER — PANTOPRAZOLE SODIUM 40 MG IV SOLR
40.0000 mg | INTRAVENOUS | Status: DC
  Administered 2013-02-18 – 2013-02-26 (×9): 40 mg via INTRAVENOUS
  Filled 2013-02-18 (×10): qty 40

## 2013-02-18 MED ORDER — CLOPIDOGREL BISULFATE 75 MG PO TABS
75.0000 mg | ORAL_TABLET | Freq: Every day | ORAL | Status: DC
  Administered 2013-02-19 – 2013-02-24 (×6): 75 mg via ORAL
  Filled 2013-02-18 (×8): qty 1

## 2013-02-18 NOTE — Discharge Summary (Signed)
Physician Discharge Summary  Patient ID: [name] [name] MRN: [id number] DOB/AGE: Dec 09, 1940 72 y.o.  Admit date: 02/18/2013 Discharge date: 02/18/2013  Primary Care Physician:  Laurette Schimke, MD   Discharge Diagnoses:  Uncontrolled nausea and vomiting secondary to partial bowel obstruction, poorly controlled pain, severe malnutrition  Present on Admission:  . Abdominal distension . Abdominal pain, unspecified site . Constipation . COPD . Depression . DM w/o Complication Type II . DYSLIPIDEMIA . GERD . Nausea and vomiting . Omental mass . Ovarian cancer . Partial small bowel obstruction  Discharge Medications:    Medication List    STOP taking these medications       docusate sodium 100 MG capsule  Commonly known as:  COLACE     polyethylene glycol packet  Commonly known as:  MIRALAX / GLYCOLAX      TAKE these medications       albuterol (2.5 MG/3ML) 0.083% nebulizer solution  Commonly known as:  PROVENTIL  Take 2.5 mg by nebulization every 6 (six) hours as needed for wheezing.     ALPRAZolam 0.5 MG tablet  Commonly known as:  XANAX  TAKE ONE TABLET TWICE A DAY PRN ANXIETY OR IF NEEDED FOR SLEEP     atenolol 25 MG tablet  Commonly known as:  TENORMIN  Take 12.5 mg by mouth 2 (two) times daily.     budesonide-formoterol 160-4.5 MCG/ACT inhaler  Commonly known as:  SYMBICORT  Inhale 2 puffs into the lungs 2 (two) times daily.     buPROPion 150 MG 12 hr tablet  Commonly known as:  WELLBUTRIN SR  Take 1 tablet (150 mg total) by mouth 2 (two) times daily.     busPIRone 15 MG tablet  Commonly known as:  BUSPAR  Take 1 tablet (15 mg total) by mouth 2 (two) times daily.     clopidogrel 75 MG tablet  Commonly known as:  PLAVIX  Take 1 tablet (75 mg total) by mouth daily with breakfast.     COMBIVENT RESPIMAT 20-100 MCG/ACT Aers respimat  Generic drug:  Ipratropium-Albuterol  Inhale 1 puff into the lungs every 6 (six) hours as needed for  wheezing or shortness of breath.     diazepam 5 MG tablet  Commonly known as:  VALIUM  Take 5-10 mg by mouth at bedtime as needed for sleep.     lovastatin 40 MG tablet  Commonly known as:  MEVACOR  Take 1 tablet (40 mg total) by mouth daily.     omeprazole 40 MG capsule  Commonly known as:  PRILOSEC  Take 1 capsule (40 mg total) by mouth daily.     ONE TOUCH ULTRA TEST test strip  Generic drug:  glucose blood  USE AS DIRECTED     oxyCODONE-acetaminophen 5-325 MG per tablet  Commonly known as:  PERCOCET/ROXICET  Take 1 tablet by mouth every 6 (six) hours as needed for pain.     PRESCRIPTION MEDICATION  at bedtime. Home Paranatal Nutrition from [facility].     ranitidine 150 MG tablet  Commonly known as:  ZANTAC  Take 1 tablet (150 mg total) by mouth 2 (two) times daily.     sertraline 100 MG tablet  Commonly known as:  ZOLOFT  Take 1 tablet (100 mg total) by mouth daily before breakfast.     UNABLE TO FIND  Inject 30 Units into the vein Nightly. Med Name:  INSULIN IN TPN NIGHTLY     ZOFRAN 4 MG/2ML Soln injection  Generic drug:  ondansetron  Inject 4 mg into the vein at bedtime. Pt gets through her port.         Disposition and Follow-up: To home, with followup of the cancer clinic as scheduled  Significant Diagnostic Studies:  Dg Chest 2 View  01/26/2013   *RADIOLOGY REPORT*  Clinical Data: Current history metastatic ovarian cancer, presenting with cough and shortness of breath.  Smoker.  CHEST - 2 VIEW  Comparison: Two-view chest x-ray 12/15/2012, 09/24/2012.  CT chest 07/06/2012.  Findings: Small bilateral pleural effusions, left greater than right, not significantly changed since the examination 12/15/2012. Associated minimal passive atelectasis in the left lower lobe, unchanged.  Lungs hyperinflated but otherwise clear.  Cardiac silhouette normal in size, unchanged.  Thoracic aorta mildly atherosclerotic, unchanged.  Hilar and mediastinal contours  otherwise unremarkable.  Surgical clips in the left axilla from prior node dissection.  Right jugular Port-A-Cath tip in the mid SVC, unchanged.  IMPRESSION: Stable small bilateral pleural effusions and associated minimal passive atelectasis in the left lower lobe.  No new abnormalities.   Original Report Authenticated By: Hulan Saas, M.D.   Dg Abd 2 Views  02/18/2013   *RADIOLOGY REPORT*  Clinical Data: Ovarian cancer.  Abdominal pain with nausea and vomiting  ABDOMEN - 2 VIEW  Comparison: CT 01/07/2013  Findings: Small bowel is mildly distended and contains air fluid levels suggesting small bowel obstruction.  No free air.  Colon is decompressed.  IMPRESSION: Findings consistent with small bowel obstruction.   Original Report Authenticated By: Janeece Riggers, M.D.    Discharge Laboratory Values: Basic Metabolic Panel:  Recent Labs Lab 02/16/13 1020 02/18/13 1820  NA 136 138  K 4.4 3.4*  CL  --  101  CO2 21* 29  GLUCOSE 157* 137*  BUN 27.3* 25*  CREATININE 0.7 0.51  CALCIUM 9.0 9.1  MG  --  1.5  PHOS  --  2.6   GFR Estimated Creatinine Clearance: 57.3 ml/min (by C-G formula based on Cr of 0.51). Liver Function Tests:  Recent Labs Lab 02/16/13 1020 02/18/13 1820  AST 29 18  ALT 27 16  ALKPHOS 138 119*  BILITOT <0.20 Repeated and Verified 0.2*  PROT 7.1 6.9  ALBUMIN 2.7* 2.7*   No results found for this basename: LIPASE, AMYLASE,  in the last 168 hours No results found for this basename: AMMONIA,  in the last 168 hours Coagulation profile No results found for this basename: INR, PROTIME,  in the last 168 hours  CBC:  Recent Labs Lab 02/16/13 1019 02/18/13 1820  WBC 8.3 9.9  NEUTROABS 5.5 7.4  HGB 10.2* 10.0*  HCT 32.1* 30.8*  MCV 89.7 88.3  PLT 320 366   Cardiac Enzymes: No results found for this basename: CKTOTAL, CKMB, CKMBINDEX, TROPONINI,  in the last 168 hours BNP: No components found with this basename: POCBNP,  CBG:  Recent Labs Lab  02/18/13 1706  GLUCAP 135*   D-Dimer No results found for this basename: DDIMER,  in the last 72 hours Hgb A1c No results found for this basename: HGBA1C,  in the last 72 hours Lipid Profile No results found for this basename: CHOL, HDL, LDLCALC, TRIG, CHOLHDL, LDLDIRECT,  in the last 72 hours Thyroid function studies No results found for this basename: TSH, T4TOTAL, FREET3, T3FREE, THYROIDAB,  in the last 72 hours Anemia work up No results found for this basename: VITAMINB12, FOLATE, FERRITIN, TIBC, IRON, RETICCTPCT,  in the last 72 hours Microbiology No results found for this  or any previous visit (from the past 240 hour(s)).   Brief H and P: For complete details please refer to admission H and P, but in brief, the patient was admitted with uncontrolled nausea and vomiting. For full details please see hospital course  Physical Exam at Discharge: BP 118/67  Pulse 91  Temp(Src) 98.2 F (36.8 C) (Oral)  Resp 20  Ht 5\' 4"  (1.626 m)  Wt 124 lb (56.246 kg)  BMI 21.27 kg/m2  SpO2 98% Gen:*Middle-aged white woman who appears chronically he'll Cardiovascular: Regular rate and rhythm Respiratory: No rales or rhonchi, fair excursion bilaterally Gastrointestinal: No bowel sounds, abdomen firm, nontender, no palpable masses Extremities: No peripheral edema    Hospital Course:  Active Problems:   Ovarian cancer   DM w/o Complication Type II   DYSLIPIDEMIA   COPD   GERD   Depression   Abdominal distension   Abdominal pain, unspecified site   Omental mass   Constipation   Partial small bowel obstruction   Nausea and vomiting 72 y.o. 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)  (1) status post left modified radical mastectomy October 1993 for a T2 N0 invasive ductal breast, estrogen and progesterone receptor positive cancer treated adjuvantly with MFL (methotrexate/fluorouracil/leucovorin) chemotherapy x6, completed  April 1994, followed by 5 years of tamoxifen completed April 1999, with no evidence of recurrence  (2) now status post TAH BSO, omentectomy, and suboptimal debulking 10/29/2011 for a high-grade serous adenocarcinoma of the ovary, pT3c NX (Stage IIIC) , s/p eight cycles of carboplatin/paclitaxel completed 05/05/2012 (initial CA125 of 2050.7 normalized after cycle 5)  (3) recurrent disease documented by rising CEA 125 an abdominal CT scan 09/21/2012.  (4) COPD with ongoing tobacco abuse  (5) diabetes mellitus  (6) genetic testing for Lynch syndrome pending.  (7) status post hospitalization in July 2013 for stroke/small acute infarcts in the left sub insular white matter and coronal radiata.  (8) port in place  (9) left upper lobe 6 mm subpleural nodule, without associated hypermetabolic activity (likely well below the resolution of PET imaging) noted August 2013  (10) CTs obtained in [place] 2014 showed evidence of significant progression of disease with widespread intraperitoneal metastases and a moderate volume of presumably malignant ascites.  (11) treated with Doxil on a Q. three-week basis, first dose given on 10/06/2012, discontinued after 5 cycles, last dose given on 12/29/2012, secondary to hand/foot syndrome and skin changes.  (12) cholecystitis, with cholecystectomy pending  (13) Hx of recurring ascites, requiring therapeutic paracentesis and Aspira drain, removed on 01/15/13  (14) Malnutrition, on TPN. Apparent infiltration of TPN noted on 12/15/12, resolved  (15) Hx of small bowel obstruction requiring hospitalization, 01/06/2013 - 01/08/2013  (16) carboplatin to be given every 21 days, first dose on 01/26/2013   The patient was admitted for management of poor control nausea and vomiting. After overnight hydration, the patient's clinical condition appeared improved. The family felt and the patient agreed that she could be managed at home. Accordingly she was discharged with close outpatient  followup planned.  Diet:  N.p.o.; the patient will receive all nutrition and hydration through her nightly T. NA  Activity:  As tolerated  Condition at Discharge:   Improved  Signed: Dr. Ruthann Cancer [phone]  02/18/2013, 7:18 PM

## 2013-02-18 NOTE — Progress Notes (Signed)
1355 Pt had several episodes of vomiting dark brown liquid. Spoke with Val and pt's status comunicated to Dr Darnelle Catalan. 1415 Pt would like to pursue being admitted to hospital. Doyce Para spoke with pt and daughter and is making arrangements for direct admit to hospital. Pt and daughter verbalize understanding and are in agreement with plan. 1530 Pt discharged via wheelchair with Regina,NA at side. PAC remains accessed and NS infusing. Husband at side. 1600 Report called to receiving RN( Kiristin Ryan,RN) on [employer]. Pt to be admitted to room 1506. Questions answered and RN given Dr Magrinat's RN's phone number for orders.

## 2013-02-18 NOTE — Patient Instructions (Addendum)
Dehydration, Adult Dehydration is when you lose more fluids from the body than you take in. Vital organs like the kidneys, brain, and heart cannot function without a proper amount of fluids and salt. Any loss of fluids from the body can cause dehydration.  CAUSES   Vomiting.  Diarrhea.  Excessive sweating.  Excessive urine output.  Fever. SYMPTOMS  Mild dehydration  Thirst.  Dry lips.  Slightly dry mouth. Moderate dehydration  Very dry mouth.  Sunken eyes.  Skin does not bounce back quickly when lightly pinched and released.  Dark urine and decreased urine production.  Decreased tear production.  Headache. Severe dehydration  Very dry mouth.  Extreme thirst.  Rapid, weak pulse (more than 100 beats per minute at rest).  Cold hands and feet.  Not able to sweat in spite of heat and temperature.  Rapid breathing.  Blue lips.  Confusion and lethargy.  Difficulty being awakened.  Minimal urine production.  No tears. DIAGNOSIS  Your caregiver will diagnose dehydration based on your symptoms and your exam. Blood and urine tests will help confirm the diagnosis. The diagnostic evaluation should also identify the cause of dehydration. TREATMENT  Treatment of mild or moderate dehydration can often be done at home by increasing the amount of fluids that you drink. It is best to drink small amounts of fluid more often. Drinking too much at one time can make vomiting worse. Refer to the home care instructions below. Severe dehydration needs to be treated at the hospital where you will probably be given intravenous (IV) fluids that contain water and electrolytes. HOME CARE INSTRUCTIONS   Ask your caregiver about specific rehydration instructions.  Drink enough fluids to keep your urine clear or pale yellow.  Drink small amounts frequently if you have nausea and vomiting.  Eat as you normally do.  Avoid:  Foods or drinks high in sugar.  Carbonated  drinks.  Juice.  Extremely hot or cold fluids.  Drinks with caffeine.  Fatty, greasy foods.  Alcohol.  Tobacco.  Overeating.  Gelatin desserts.  Wash your hands well to avoid spreading bacteria and viruses.  Only take over-the-counter or prescription medicines for pain, discomfort, or fever as directed by your caregiver.  Ask your caregiver if you should continue all prescribed and over-the-counter medicines.  Keep all follow-up appointments with your caregiver. SEEK MEDICAL CARE IF:  You have abdominal pain and it increases or stays in one area (localizes).  You have a rash, stiff neck, or severe headache.  You are irritable, sleepy, or difficult to awaken.  You are weak, dizzy, or extremely thirsty. SEEK IMMEDIATE MEDICAL CARE IF:   You are unable to keep fluids down or you get worse despite treatment.  You have frequent episodes of vomiting or diarrhea.  You have blood or green matter (bile) in your vomit.  You have blood in your stool or your stool looks black and tarry.  You have not urinated in 6 to 8 hours, or you have only urinated a small amount of very dark urine.  You have a fever.  You faint. MAKE SURE YOU:   Understand these instructions.  Will watch your condition.  Will get help right away if you are not doing well or get worse. Document Released: 07/22/2005 Document Revised: 10/14/2011 Document Reviewed: 03/11/2011 [employer] Patient Information 2014 [employer].  

## 2013-02-18 NOTE — Progress Notes (Addendum)
PARENTERAL NUTRITION CONSULT NOTE - INITIAL  Pharmacy Consult for TNA Indication: SBO/Malnutrition   Allergies  Allergen Reactions  . Promethazine     "Makes me feel like I want to jump out of my skin"    Patient Measurements: Height: 5\' 4"  (162.6 cm) Weight: 124 lb (56.246 kg) IBW/kg (Calculated) : 54.7 Usual Weight:122 lb  Vital Signs: Temp: 98.2 F (36.8 C) (07/17 1615) Temp src: Oral (07/17 1615) BP: 118/67 mmHg (07/17 1615) Pulse Rate: 91 (07/17 1615) Intake/Output from previous day:   Intake/Output from this shift:    Labs:  Recent Labs  02/16/13 1019  WBC 8.3  HGB 10.2*  HCT 32.1*  PLT 320     Recent Labs  02/16/13 1020  NA 136  K 4.4  CO2 21*  GLUCOSE 157*  BUN 27.3*  CREATININE 0.7  CALCIUM 9.0  PROT 7.1  ALBUMIN 2.7*  AST 29  ALT 27  ALKPHOS 138  BILITOT <0.20 Repeated and Verified   Estimated Creatinine Clearance: 57.3 ml/min (by C-G formula based on Cr of 0.7).    Recent Labs  02/18/13 1706  GLUCAP 135*    Medical History: Past Medical History  Diagnosis Date  . History of breast cancer   . COPD (chronic obstructive pulmonary disease)   . GERD (gastroesophageal reflux disease)   . Diabetes mellitus     type II  . Hx of colonic polyps   . Osteoarthritis   . MVP (mitral valve prolapse)   . Dysrhythmia     pt states runs a little fast   . Shortness of breath     with exertion   . Depression   . Anxiety   . Heart murmur   . Stroke   . Ascites 10/22/2012  . metastases dx'd 10/2011  . Ovarian cancer dx'd2/2013    active chemotherapy  . Breast CA 1993  . Partial small bowel obstruction 01/2013    Medications:  Scheduled:  . atenolol  12.5 mg Oral BID  . budesonide-formoterol  2 puff Inhalation BID  . buPROPion  150 mg Oral BID  . busPIRone  15 mg Oral BID  . [START ON 02/19/2013] clopidogrel  75 mg Oral Q breakfast  . enoxaparin (LOVENOX) injection  40 mg Subcutaneous Q24H  . ondansetron  4 mg Intravenous QHS  .  pantoprazole (PROTONIX) IV  40 mg Intravenous Q24H  . [START ON 02/19/2013] sertraline  100 mg Oral QAC breakfast   Infusions:  . sodium chloride 100 mL/hr at 02/18/13 1628   PRN: albuterol, ALPRAZolam, diazepam, Ipratropium-Albuterol, prochlorperazine  Insulin Requirements in the past 24 hours:  Patient adds 30 units regular insulin to TNA each night. Patient adds MVI on MWF  Current Nutrition:  Home TNA per [facility] Per outpatient RD note - TNA delivers 84 gm protein (100% estimated needs) , 252 grams dextrose, providing 1193 kcal (77% estimated needs).  Rate noted as infused over 12 hr, 1hr ramp up and ramp down; total . Will infuse at 48ml/hr x 1 hr, then 158 ml/hr x 10 hr, then 49ml/hr x1, off x 10 hr.  Assessment: 72 yo F with metastatic ovarian cancer, being followed by outpatient dietitian for Home TNA.   She was hospitalized in March 2014 with a SBO and discharged home with TNA.  Patient reports attempting to eat about 3 times daily, and drinks carnation instant breakfast in the morning.  She has declined enteral nutrition in the past.  She is being admitted directly from  [facility] for uncontrolled Nausea and Vomiting over the past 48 hours.  She reports increased PO intake over the last week with foods such as hamburgers and peanuts.    Labs  Glucose - CBG's okay on admission  Electrolytes - Potassium low 3.4, other electrolytes WNL  LFTs - WNL  TGs - ordered  Prealbumin - ordered   Nutritional Goals:  1550-1650 kCal 73-85 grams of protein per day   Plan:  Continue TNA/Fat emulsion per home regimen tonight - Infuse at 50 ml/hr x 1 hour, then increase rate to 158 ml/hr x 10 hours, then infuse 50 ml/hr x 1 hour and turn off.   Note, Protocol is to change to Hospital supplied TNA after 24 hours, will discuss with patient in AM   Potassium 20 mEq IV tonight   Patient adds MVI into TNA on MWF - will continue this here and add in pharmacy   CBG's Q8h  TNA  labs tomorrow morning, then every Monday and Thursday  F/u with inpatient dietary consult in AM  IV team notified of TNA to be administered once obtained by family and reviewed by pharmacy   Americo Vallery, Loma Messing PharmD Pager #: [phone] 6:21 PM 02/18/2013

## 2013-02-18 NOTE — Progress Notes (Signed)
[name] [name] called today reporting she has had increasing problems with nausea and vomiting over the past 48 hours. On further questioning she has been meeting more for the last week (that is by mouth). She had a hamburger, some peanuts, and other rather difficult foods. She vomited yesterday morning, and again at least twice more yesterday, as well as this morning.  We asked her to command and on the way and she had a KUB which is being read as consistent with small bowel obstruction. It certainly looks of more obstructed than the last filmed she had in June.  I offered to put her in the hospital in place an NG tube, which would give her immediate relief, but of course the NG tube itself can be a source of discomfort. She "prefers to vomit". We are giving her fluids and Compazine by vein here and she will receive an additional 500 cc of fluid daily together with her daily TNA for the next week. She is a ready scheduled to see me again July 22, and we will repeat a KUB to views just before that visit, as well as of course as her lab work.  She understands if her nausea and vomiting become worse she will call and we will get her admitted, but at present I think all she really needs to do is take nothing by mouth including no fluids for the next 5 days. She knows to call for any other issues that may develop before her next visit here.

## 2013-02-18 NOTE — H&P (Signed)
[name] [name]  02/16/2013 10:15 AM   Office Visit  MRN:  [id number]   Description: 72 year old female  Provider: Catalina Gravel, PA-C  Department: [facility]        Diagnoses    Ovarian cancer, unspecified laterality    -  Primary    183.0    Anxiety        300.00    Personal history of malignant neoplasm of breast        V10.3    Partial small bowel obstruction        560.9      Reason for Visit    Follow-up        Current Vitals - Last Recorded    BP Pulse Temp(Src) Resp Ht Wt    106/64 94 97.3 F (36.3 C) (Oral) 20 5\' 4"  (1.626 m) 125 lb (56.7 kg)       BMI              21.45 kg/m2                 Progress Notes    Catalina Gravel, PA-C at 02/16/2013  1:21 PM    Status: Signed                   ID: [name] [name]   DOB: [birth year]  MR#: [id number]  CSN#:[id number]   PCP:   GYN: [name] Schimke, MD [name] [name]: [name] [name] OTHER MD:   HISTORY OF PRESENT ILLNESS: [name] [name] is a 72 year-old [place] woman I followed remotely for a history of breast cancer. More recently she noted that her belly was getting "bigger". Gradually she has developed increasing low back pain. After a period of several months, as her symptoms increased, she brought this problem to her primary physician's attention and a KUB was obtained on 10/07/2011, which was unremarkable. This was followed by a CT scan 10/15/2011, which showed ascites and omental caking. Paracentesis 10/18/2011 showed ([id number]) malignant cells consistent with metastatic adenocarcinoma, with equivocal staining for estrogen receptor. The pattern of additional stains was most suggestive of a primary gynecologic tumor. CA 125 at this time was 2051.   The patient was referred to gynecologic oncology and on 10/29/2011 underwent exploratory laparotomy under Drs. Cleda Mccreedy and Antionette Char. This consisted of a total abdominal hysterectomy with bilateral salpingo-oophorectomy, omentectomy, and radical tumor  debulking. Unfortunately there was significant tumor attached to the diaphragm so the patient was suboptimally debulked. Accordingly a peritoneal catheter was not placed. GYN-ONC recommended 6 cycles of carboplatin/paclitaxel given every 3 weeks, with Neulasta on day 2 for granulocyte support. [name] [name]'s subsequent history is as detailed below.   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 She is due for day 1 cycle 2 of every three-week carboplatin which she is tolerating very well.   [name] [name] is functional status is improving. Although she is walking slowly, she is not in a wheelchair today. She continues to have some chronic lower back pain and abdominal pain, primarily in the left upper quadrant. She finds relief with Percocet. She's having soft bowel movements. She denies any problems with nausea or emesis. She continues to receive nightly TNA.   REVIEW OF SYSTEMS:   [name] [name] denies any fevers or chills. She's had no skin changes and denies any signs of abnormal bleeding. Her energy level is fair. She does get tired easily, and has shortness  of breath with exertion. She denies any cough, phlegm production, or chest pain. She's had no abnormal headaches or dizziness.   A detailed review of systems is otherwise noncontributory.     PAST MEDICAL HISTORY: Past Medical History   Diagnosis  Date   .  History of breast cancer     .  COPD (chronic obstructive pulmonary disease)     .  GERD (gastroesophageal reflux disease)     .  Diabetes mellitus         type II   .  Hx of colonic polyps     .  Osteoarthritis     .  MVP (mitral valve prolapse)     .  Dysrhythmia         pt states runs a little fast    .  Shortness of breath         with exertion    .  Depression     .  Anxiety     .  Heart murmur     .  Stroke     .  Ascites  10/22/2012   .  metastases  dx'd 10/2011   .  Ovarian cancer  dx'd2/2013        active chemotherapy   .  Breast CA  1993   .  Partial small bowel obstruction  01/2013        PAST SURGICAL HISTORY: Past Surgical History   Procedure  Laterality  Date   .  Cesarean section    1980, 1982       X 2    .  Laparoscopy           work up for infertility   .  Tympanoplasty           left X 2   .  Left breast fibroadenoma removal    1960's   .  Breast cyst aspirations       .  Left modified radial mastectomy           >20 years ago   .  Laparotomy    10/29/2011       Procedure: EXPLORATORY LAPAROTOMY;  Surgeon: Rejeana Brock A. Duard Brady, MD;  Location: [facility];  Service: Gynecology;  Laterality: N/A;   .  Abdominal hysterectomy    10/29/2011       Procedure: HYSTERECTOMY ABDOMINAL;  Surgeon: Rejeana Brock A. Duard Brady, MD;  Location: [facility];  Service: Gynecology;  Laterality: N/A;  Total abdominal hysterectomy   .  Salpingoophorectomy    10/29/2011       Procedure: SALPINGO OOPHERECTOMY;  Surgeon: Rejeana Brock A. Duard Brady, MD;  Location: [facility]; Service: Gynecology;  Laterality: Bilateral;        FAMILY HISTORY Family History   Problem  Relation  Age of Onset   .  Cancer  Mother         endometrial/ colon   .  Cancer  Brother         colon   .  COPD  Other     .  Hypertension  Other     .  Cancer  Maternal Uncle         prostate/bladder/lung   .  Cancer  Paternal Aunt         unknown abdominal cancer   .  Cancer  Maternal Grandmother         Salivary gland cancer   .  Cancer  Paternal Aunt  leukemia     the patient's father died at the age of 24. The patient's mother died at the age of 41; she had a history of colon cancer and endometrial cancer. The patient has no sisters. She has one brother, with a history of colon cancer. There is no history of breast cancer in the family. The patient is scheduled for genetic testing later this month.   GYNECOLOGIC HISTORY: She had menarche age 26. First pregnancy to term was at age 56. She is GX P2. She became menopausal at the time of  her chemotherapy for breast cancer. This was age 69. 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 Her husband [name] runs a Psychologist, sport and exercise out of their own home. Daughter [name], 102,  currently lives at home with the patient. There are some issues relating to this daughter that the patient discussed briefly. [place]. 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 The patient has no grandchildren. She is not a Advice worker.              ADVANCED DIRECTIVES: in place   HEALTH MAINTENANCE: History   Substance Use Topics   .  Smoking status:  Current Some Day Smoker -- 0.50 packs/day for 30 years       Types:  Cigarettes   .  Smokeless tobacco:  Never Used         Comment: quit again 2009; restarted 1 ppd 2011   .  Alcohol Use:  No                    Colonoscopy: 2009             PAP: s/p hysterectomy             Bone density: 2010. "good"             Mammography: 2012 NOV--unremarkable    Allergies   Allergen  Reactions   .  Promethazine         "Makes me feel like I want to jump out of my skin"         Prior to Admission medications    Medication  Sig  Start Date  End Date  Taking?  Authorizing Provider   albuterol (PROVENTIL) (2.5 MG/3ML) 0.083% nebulizer solution  Take 2.5 mg by nebulization every 6 (six) hours as needed for wheezing.      Yes  Historical Provider, MD   ALPRAZolam Prudy Feeler) 0.5 MG tablet  Take 1 tablet (0.5 mg total) by mouth 2 (two) times daily as needed for sleep.  01/19/13    Yes  Amy Allegra Grana, PA-C   atenolol (TENORMIN) 25 MG tablet  Take 12.5 mg by mouth 2 (two) times daily.      Yes  Historical Provider, MD   budesonide-formoterol (SYMBICORT) 160-4.5 MCG/ACT inhaler  Inhale 2 puffs into the lungs 2 (two) times daily.      Yes  Historical Provider, MD   buPROPion (WELLBUTRIN SR) 150 MG 12 hr tablet  Take 1 tablet (150 mg total) by mouth 2 (two) times daily.  07/17/12  07/17/13  Yes  Georgina Quint Plotnikov, MD   busPIRone (BUSPAR) 15 MG tablet  Take 1 tablet (15 mg total) by mouth 2 (two) times daily.  07/01/12  07/01/13  Yes  Georgina Quint Plotnikov, MD   clopidogrel (PLAVIX) 75 MG tablet  Take 1 tablet (75 mg total)  by mouth daily with breakfast.  10/02/12  10/02/13  Yes  Georgina Quint Plotnikov, MD   diazepam (VALIUM) 5 MG tablet  Take 5-10 mg by mouth at bedtime as needed for sleep.      Yes  Historical Provider, MD   docusate sodium (COLACE) 100 MG capsule  Take 100 mg by mouth 2 (two) times daily.      Yes  Historical Provider, MD   Ipratropium-Albuterol (COMBIVENT RESPIMAT) 20-100 MCG/ACT AERS respimat  Inhale 1 puff into the lungs every 6 (six) hours as needed for wheezing or shortness of breath.      Yes  Historical Provider, MD   lovastatin (MEVACOR) 40 MG tablet  Take 1 tablet (40 mg total) by mouth daily.  06/15/12    Yes  Georgina Quint Plotnikov, MD   omeprazole (PRILOSEC) 40 MG capsule  Take 1 capsule (40 mg total) by mouth daily.  07/08/12  07/08/13  Yes  Georgina Quint Plotnikov, MD   ondansetron (ZOFRAN) 4 MG/2ML SOLN injection  Inject 4 mg into the vein at bedtime. Pt gets through her port.      Yes  Historical Provider, MD   ONE TOUCH ULTRA TEST test strip  USE AS DIRECTED  11/18/12    Yes  Tresa Garter, MD   oxyCODONE-acetaminophen (PERCOCET/ROXICET) 5-325 MG per tablet  Take 1 tablet by mouth every 6 (six) hours as needed for pain.  02/02/13    Yes  Lowella Dell, MD   polyethylene glycol (MIRALAX / GLYCOLAX) packet  Take 17 g by mouth daily.      Yes  Historical Provider, MD   PRESCRIPTION MEDICATION  at bedtime. Home Paranatal Nutrition from [facility].      Yes  Historical Provider, MD   ranitidine (ZANTAC) 150 MG tablet  Take 1 tablet (150 mg total) by mouth 2 (two) times daily.  12/23/12  12/23/13  Yes  Georgina Quint Plotnikov, MD   sertraline (ZOLOFT) 100 MG tablet  Take 1 tablet (100 mg total) by mouth daily before breakfast.  09/22/12    Yes  Georgina Quint Plotnikov, MD   UNABLE  TO FIND  Inject 30 Units into the vein Nightly. Med Name:  INSULIN IN TPN NIGHTLY      Yes  Historical Provider, MD        OBJECTIVE: Older white woman  in no acute distress Filed Vitals:     02/16/13 1036   BP:  106/64   Pulse:  94   Temp:  97.3 F (36.3 C)   Resp:  20       Body mass index is 21.45 kg/(m^2).    ECOG FS: 2 Filed Weights     02/16/13 1036   Weight:  125 lb (56.7 kg)      Sclerae unicteric Oropharynx clear, no ulcerations or evidence of thrush. Buccal mucosa is slightly dry. No palpable cervical or supraclavicular adenopathy Lungs: diminished breath sounds bilaterally in the bases, more so on the right than the left, stable; No wheezes or rhonchi Heart regular rate and rhythm Abdomen soft, mildly tender in the left upper quadrant, without any palpable mass, no obvious ascites by palpation, positive bowel sounds,   MSK no focal spinal tenderness   No peripheral edema Neuro: nonfocal, well oriented, positive affect Breasts: Deferred. No axillary adenopathy. Port is intact in the right upper chest wall.     LAB RESULTS: Lab Results   Component  Value  Date  WBC  8.3  02/16/2013     NEUTROABS  5.5  02/16/2013     HGB  10.2*  02/16/2013     HCT  32.1*  02/16/2013     MCV  89.7  02/16/2013     PLT  320  02/16/2013           Chemistry         Component  Value  Date/Time     NA  136  02/16/2013 1020     NA  134*  01/08/2013 0427     K  4.4  02/16/2013 1020     K  3.8  01/08/2013 0427     CL  97*  01/26/2013 1334     CL  98  01/08/2013 0427     CO2  21*  02/16/2013 1020     CO2  28  01/08/2013 0427     BUN  27.3*  02/16/2013 1020     BUN  18  01/08/2013 0427     CREATININE  0.7  02/16/2013 1020     CREATININE  0.45*  01/08/2013 0427          Component  Value  Date/Time     CALCIUM  9.0  02/16/2013 1020     CALCIUM  9.1  01/08/2013 0427     ALKPHOS  138  02/16/2013 1020     ALKPHOS  112  01/07/2013 0407     AST  29  02/16/2013 1020     AST  12  01/07/2013 0407      ALT  27  02/16/2013 1020     ALT  12  01/07/2013 0407     BILITOT  <0.20 Repeated and Verified  02/16/2013 1020     BILITOT  0.1*  01/07/2013 0407          CA 125                      Pending           02/16/2013                                   478.4               01/26/2013                                   462.6               01/12/2013                                   475.8               12/15/2012                                   515.9               12/08/2012                                   473.0  11/17/2012                                   599.7               10/27/2012                                   1066.3             10/06/2012       STUDIES:     Ct Abdomen Pelvis W Contrast   01/07/2013   *RADIOLOGY REPORT*  Clinical Data: Abdominal pain, distension, nausea, weight loss, constipation, metastatic ovarian cancer  CT ABDOMEN AND PELVIS WITH CONTRAST  Technique:  Multidetector CT imaging of the abdomen and pelvis was performed following the standard protocol during bolus administration of intravenous contrast.  Contrast: OMNIPAQUE IOHEXOL 300 MG/ML  SOLN  Comparison: 11/02/2012  Findings: Moderate bilateral pleural effusions, new.  Liver, spleen, pancreas, and adrenal glands are within normal limits.  Distended gallbladder, although improved.  No intrahepatic or extrahepatic ductal dilatation.  4 mm nonobstructing left lower pole renal calculus (series 2/image 32).  5 mm right lower pole renal cyst.  No hydronephrosis.  Small volume abdominal ascites, similar.  Peritoneal drainage catheter terminates in the left mid abdomen (series 2/image 54).  3.8 x 6.0 cm fluid collection posterior to the stomach (series 2/image 25), now with a developing thin rim.  Peritoneal carcinomatosis, mildly improved.  For example, omental caking measures 4.0 x 10.2 cm beneath the left anterior abdominal wall (series 2/image 34), previously 5.2 x 10.9 cm.  A 3.6 x 3.5 cm  gastrohepatic ligament implant (series 2/image 26) previously measured 4.0 x 3.7 cm.  Additional area of tumor in the lower pelvis is difficult to discretely measure.  No evidence of bowel obstruction. While mildly prominent loops of small bowel are present in the left lower abdomen, contrast opacifies the proximal colon.  Atherosclerotic calcifications of the abdominal aorta and branch vessels.  Bladder is within normal limits.  Degenerative changes of the visualized thoracolumbar spine.  IMPRESSION: Peritoneal carcinomatosis, mildly improved.  Small volume abdominal ascites with peritoneal drainage catheter, grossly unchanged.  Developing loculated 3.8 x 6.0 cm fluid collection posterior to the stomach.  Moderate bilateral pleural effusions, new.  No evidence of bowel obstruction.  Additional stable ancillary findings as above.   Original Report Authenticated By: Charline Bills, M.D.      ASSESSMENT: 72 y.o.  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)     (1) status post left modified radical mastectomy October 1993 for a T2 N0 invasive ductal breast, estrogen and progesterone receptor positive cancer treated adjuvantly with MFL (methotrexate/fluorouracil/leucovorin) chemotherapy x6, completed April 1994, followed by 5 years of tamoxifen completed April 1999, with no evidence of recurrence   (2) now status post TAH BSO, omentectomy, and suboptimal debulking 10/29/2011 for a high-grade serous adenocarcinoma of the ovary, pT3c NX (Stage IIIC) , s/p eight cycles of carboplatin/paclitaxel completed 05/05/2012 (initial CA125 of 2050.7 normalized after cycle 5)   (3) recurrent disease documented by rising CEA 125 an abdominal CT scan 09/21/2012.   (4) COPD with ongoing tobacco abuse   (5) diabetes mellitus   (6) genetic testing for Lynch syndrome pending.   (7)  status post hospitalization in July  2013 for stroke/small acute infarcts  in the left sub insular white matter and coronal radiata.   (8) port in place   (9) left upper lobe 6 mm subpleural nodule, without associated hypermetabolic activity (likely well below the resolution of PET imaging) noted August 2013   (10) CTs obtained in [place] 2014 showed evidence of significant progression of disease with widespread intraperitoneal metastases and a moderate volume of presumably malignant ascites.   (11) treated with Doxil on a Q. three-week basis, first dose given on 10/06/2012, discontinued after 5 cycles, last dose given on 12/29/2012, secondary to hand/foot syndrome and skin changes.   (12) cholecystitis, with cholecystectomy pending   (13)  Hx of recurring ascites, requiring therapeutic paracentesis and Aspira drain, removed on 01/15/13   (14) Malnutrition, on TPN.  Apparent infiltration of TPN noted on 12/15/12, resolved    (15)  Hx of small bowel obstruction requiring hospitalization, 01/06/2013 - 01/08/2013   (16)  carboplatin to be given every 21 days, first dose on 01/26/2013     PLAN:   [name] [name] will receive her second q. three-week dose of carboplatin today as scheduled. She will see Dr. Darnelle Catalan for followup next week on July 22, and is already scheduled to see Dr. Nelly Rout for followup in early August. She is scheduled for chemotherapy through early September.   [name] [name] and [name] [name] both voice understanding and agreement with our plan. She will call with any changes or problems.   BERRY,AMY    02/16/2013                Not recorded     Medications Ordered This Encounter      Disp Refills Start End    UNABLE TO FIND 30 Units 0 02/16/2013      Inject 30 Units into the vein Nightly. Med Name:  INSULIN IN TPN NIGHTLY - Intravenous       Discontinued Medications      Reason for Discontinue    UNABLE TO FIND Reorder       Orders Placed This Encounter    Recurring Lab Work    CA 125 [VZD638 Custom]    Interval: every 3 weeks  until 02/16/2014    Expires: 02/16/2014    Comprehensive metabolic panel [LAB17 Custom]    Interval: every 3 weeks until 02/16/2014    Expires: 02/16/2014          Level of Service    PR OFFICE OUTPATIENT VISIT 25 MINUTES [99214]      Follow-up and Disposition    Routing History Recorded        All Flowsheet Templates (all recorded)    Amb Complex Vitals Nav Flowsheet    Custom Formula Data Flowsheet    Anthropometrics Flowsheet    Followup Visit Patient Information Flowsheet    Oncology Scheduling Plan Flowsheet           Referring Provider    [name] Schimke, MD       All Charges for This Encounter    Code Description Service Date Service Provider Modifiers Qty    99214 PR OFFICE OUTPATIENT VISIT 25 MINUTES 02/16/2013 Amy Allegra Grana, PA-C   1    [phone] PR PATIENT SCREENED TOBACCO USE, RECEIVED CESSATION CONSELING IF USER 02/16/2013 Catalina Gravel, PA-C   1        Other Encounter Related Information    Allergies & Medications         Problem List  History         Patient-Entered Questionnaires   AVS Reports    Date/Time Report Action User    02/16/2013  1:14 PM After Visit Summary Printed Amy Allegra Grana, PA-C      Diabetic Foot Exam    No data filed      Diabetic Foot Form - Detailed    No data filed      Diabetic Foot Exam - Simple    No data filed

## 2013-02-19 ENCOUNTER — Inpatient Hospital Stay (HOSPITAL_COMMUNITY): Payer: Medicare Other

## 2013-02-19 DIAGNOSIS — G893 Neoplasm related pain (acute) (chronic): Secondary | ICD-10-CM

## 2013-02-19 LAB — URINALYSIS, ROUTINE W REFLEX MICROSCOPIC
Glucose, UA: NEGATIVE mg/dL
Hgb urine dipstick: NEGATIVE
Ketones, ur: NEGATIVE mg/dL
Protein, ur: NEGATIVE mg/dL
Urobilinogen, UA: 0.2 mg/dL (ref 0.0–1.0)

## 2013-02-19 LAB — COMPREHENSIVE METABOLIC PANEL
ALT: 18 U/L (ref 0–35)
Albumin: 2.8 g/dL — ABNORMAL LOW (ref 3.5–5.2)
Alkaline Phosphatase: 117 U/L (ref 39–117)
BUN: 21 mg/dL (ref 6–23)
Chloride: 101 mEq/L (ref 96–112)
Potassium: 3.4 mEq/L — ABNORMAL LOW (ref 3.5–5.1)
Sodium: 139 mEq/L (ref 135–145)
Total Bilirubin: 0.4 mg/dL (ref 0.3–1.2)

## 2013-02-19 LAB — DIFFERENTIAL
Eosinophils Relative: 0 % (ref 0–5)
Lymphocytes Relative: 23 % (ref 12–46)
Lymphs Abs: 2.1 10*3/uL (ref 0.7–4.0)
Monocytes Absolute: 1.1 10*3/uL — ABNORMAL HIGH (ref 0.1–1.0)
Monocytes Relative: 13 % — ABNORMAL HIGH (ref 3–12)
Neutro Abs: 5.8 10*3/uL (ref 1.7–7.7)

## 2013-02-19 LAB — TRIGLYCERIDES: Triglycerides: 197 mg/dL — ABNORMAL HIGH (ref <150)

## 2013-02-19 LAB — CBC
HCT: 31.4 % — ABNORMAL LOW (ref 36.0–46.0)
Hemoglobin: 10 g/dL — ABNORMAL LOW (ref 12.0–15.0)
MCV: 89.5 fL (ref 78.0–100.0)
RBC: 3.51 MIL/uL — ABNORMAL LOW (ref 3.87–5.11)
WBC: 9.1 10*3/uL (ref 4.0–10.5)

## 2013-02-19 LAB — PREALBUMIN: Prealbumin: 17.6 mg/dL — ABNORMAL LOW (ref 17.0–34.0)

## 2013-02-19 LAB — GLUCOSE, CAPILLARY
Glucose-Capillary: 115 mg/dL — ABNORMAL HIGH (ref 70–99)
Glucose-Capillary: 198 mg/dL — ABNORMAL HIGH (ref 70–99)

## 2013-02-19 MED ORDER — POTASSIUM CHLORIDE 10 MEQ/100ML IV SOLN
10.0000 meq | INTRAVENOUS | Status: AC
  Administered 2013-02-19 (×2): 10 meq via INTRAVENOUS
  Filled 2013-02-19 (×2): qty 100

## 2013-02-19 MED ORDER — TRACE MINERALS CR-CU-F-FE-I-MN-MO-SE-ZN IV SOLN
INTRAVENOUS | Status: AC
  Administered 2013-02-19: 17:00:00 via INTRAVENOUS
  Filled 2013-02-19: qty 2000

## 2013-02-19 MED ORDER — MORPHINE SULFATE 2 MG/ML IJ SOLN
2.0000 mg | Freq: Every evening | INTRAMUSCULAR | Status: DC | PRN
  Administered 2013-02-19 – 2013-02-20 (×2): 2 mg via INTRAVENOUS
  Filled 2013-02-19 (×2): qty 1

## 2013-02-19 MED ORDER — FAT EMULSION 20 % IV EMUL
250.0000 mL | INTRAVENOUS | Status: AC
  Administered 2013-02-19: 250 mL via INTRAVENOUS
  Filled 2013-02-19: qty 250

## 2013-02-19 MED ORDER — KETOROLAC TROMETHAMINE 15 MG/ML IJ SOLN
15.0000 mg | Freq: Three times a day (TID) | INTRAMUSCULAR | Status: AC | PRN
  Administered 2013-02-19 – 2013-02-24 (×11): 15 mg via INTRAVENOUS
  Filled 2013-02-19 (×11): qty 1

## 2013-02-19 NOTE — Progress Notes (Signed)
[name] [name]   DOB:Sep 04, 1940   WU#:[id number]   GNF#:[id number]  Subjective: very uncomfortable last night, partly because of the ng and partly because of nausea (still gagging, no vomitus); has drained approx 2 L so far; not passing gas; mild abdominal discomfort, at baseline; not getting OOB; family in room   Objective: 72-year-old white woman examined in bed Filed Vitals:   02/19/13 1419  BP: 131/77  Pulse: 101  Temp: 98.5 F (36.9 C)  Resp: 18    Body mass index is 21.27 kg/(m^2).  Intake/Output Summary (Last 24 hours) at 02/19/13 1524 Last data filed at 02/19/13 1017  Gross per 24 hour  Intake 253.33 ml  Output   2450 ml  Net -2196.67 ml     Sclerae unicteric  Oropharynx clear, dry  No peripheral adenopathy  Lungs clear -- no rales or rhonchi--auscultated anterolaterally  Heart regular rate and rhythm  Abdomen mildly distended, diminished but present BD, NT to mild palpation  MSK, no peripheral edema  Neuro nonfocal, lethargic  Breast exam: deferred  CBG (last 3)   Recent Labs  02/18/13 1706 02/19/13 0132 02/19/13 0825  GLUCAP 135* 115* 141*     Labs:  Lab Results  Component Value Date   WBC 9.1 02/19/2013   HGB 10.0* 02/19/2013   HCT 31.4* 02/19/2013   MCV 89.5 02/19/2013   PLT 404* 02/19/2013   NEUTROABS 5.8 02/19/2013    @LASTCHEMISTRY @  Urine Studies No results found for this basename: UACOL, UAPR, USPG, UPH, UTP, UGL, UKET, UBIL, UHGB, UNIT, UROB, ULEU, UEPI, UWBC, URBC, UBAC, CAST, CRYS, UCOM, BILUA,  in the last 72 hours  Basic Metabolic Panel:  Recent Labs Lab 02/16/13 1020 02/18/13 1820 02/19/13 0410  NA 136 138 139  K 4.4 3.4* 3.4*  CL  --  101 101  CO2 21* 29 30  GLUCOSE 157* 137* 115*  BUN 27.3* 25* 21  CREATININE 0.7 0.51 0.55  CALCIUM 9.0 9.1 9.1  MG  --  1.5 1.6  PHOS  --  2.6 3.2   GFR Estimated Creatinine Clearance: 57.3 ml/min (by C-G formula based on Cr of 0.55). Liver Function Tests:  Recent Labs Lab 02/16/13 1020  02/18/13 1820 02/19/13 0410  AST 29 18 19   ALT 27 16 18   ALKPHOS 138 119* 117  BILITOT <0.20 Repeated and Verified 0.2* 0.4  PROT 7.1 6.9 6.7  ALBUMIN 2.7* 2.7* 2.8*   No results found for this basename: LIPASE, AMYLASE,  in the last 168 hours No results found for this basename: AMMONIA,  in the last 168 hours Coagulation profile No results found for this basename: INR, PROTIME,  in the last 168 hours  CBC:  Recent Labs Lab 02/16/13 1019 02/18/13 1820 02/19/13 0410  WBC 8.3 9.9 9.1  NEUTROABS 5.5 7.4 5.8  HGB 10.2* 10.0* 10.0*  HCT 32.1* 30.8* 31.4*  MCV 89.7 88.3 89.5  PLT 320 366 404*   Cardiac Enzymes: No results found for this basename: CKTOTAL, CKMB, CKMBINDEX, TROPONINI,  in the last 168 hours BNP: No components found with this basename: POCBNP,  CBG:  Recent Labs Lab 02/18/13 1706 02/19/13 0132 02/19/13 0825  GLUCAP 135* 115* 141*   D-Dimer No results found for this basename: DDIMER,  in the last 72 hours Hgb A1c No results found for this basename: HGBA1C,  in the last 72 hours Lipid Profile  Recent Labs  02/19/13 0410  TRIG 197*   Thyroid function studies No results found for this basename:  TSH, T4TOTAL, FREET3, T3FREE, THYROIDAB,  in the last 72 hours Anemia work up No results found for this basename: VITAMINB12, FOLATE, FERRITIN, TIBC, IRON, RETICCTPCT,  in the last 72 hours Microbiology No results found for this or any previous visit (from the past 240 hour(s)).    Studies:  Dg Abd 2 Views  02/18/2013   *RADIOLOGY REPORT*  Clinical Data: Ovarian cancer.  Abdominal pain with nausea and vomiting  ABDOMEN - 2 VIEW  Comparison: CT 01/07/2013  Findings: Small bowel is mildly distended and contains air fluid levels suggesting small bowel obstruction.  No free air.  Colon is decompressed.  IMPRESSION: Findings consistent with small bowel obstruction.   Original Report Authenticated By: Janeece Riggers, M.D.    Assessment: 72 y.o. 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), admitted with small bowel obstruction (1) status post left modified radical mastectomy October 1993 for a T2 N0 invasive ductal breast, estrogen and progesterone receptor positive cancer treated adjuvantly with MFL (methotrexate/fluorouracil/leucovorin) chemotherapy x6, completed April 1994, followed by 5 years of tamoxifen completed April 1999, with no evidence of recurrence  (2) now status post TAH BSO, omentectomy, and suboptimal debulking 10/29/2011 for a high-grade serous adenocarcinoma of the ovary, pT3c NX (Stage IIIC) , s/p eight cycles of carboplatin/paclitaxel completed 05/05/2012 (initial CA125 of 2050.7 normalized after cycle 5)  (3) recurrent disease documented by rising CEA 125 an abdominal CT scan 09/21/2012.  (4) COPD with ongoing tobacco abuse  (5) diabetes mellitus  (6) genetic testing for Lynch syndrome pending.  (7) status post hospitalization in July 2013 for stroke/small acute infarcts in the left sub insular white matter and coronal radiata.  (8) port in place  (9) left upper lobe 6 mm subpleural nodule, without associated hypermetabolic activity (likely well below the resolution of PET imaging) noted August 2013  (10) CTs obtained in [place] 2014 showed evidence of significant progression of disease with widespread intraperitoneal metastases and a moderate volume of presumably malignant ascites.  (11) treated with Doxil on a Q. three-week basis, first dose given on 10/06/2012, discontinued after 5 cycles, last dose given on 12/29/2012, secondary to hand/foot syndrome and skin changes.  (12) cholecystitis, with cholecystectomy pending  (13) Hx of recurring ascites, requiring therapeutic paracentesis and Aspira drain, removed on 01/15/13  (14) Malnutrition, on TPN. Apparent infiltration of TPN noted on 12/15/12, resolved  (15) Hx of small bowel obstruction requiring hospitalization, 01/06/2013 -  01/08/2013  (16) carboplatin to be given every 21 days, first dose on 01/26/2013, second dose 02/16/2013  Plan: Olivine is still very uncomfortable; the PRN morphine is confusing her and keeping her sleepy during the day. I will change the morphine to be given only at bedtime. She will have toradol for pain during the day. I have encouraged her to get OOBTC and walk the halls. Will recheck labs and KUB QAM  She has IVF (500 cc/ day) and TNA (nightly) in place at home. I will write for her ng to be clamped SAT AM starting at 8 AM. If she tolerates that well after a few hours and she and her family feel ready for discharge, please discharge to home. Discharge meds reconciled.  Lowella Dell, MD 02/19/2013  3:24 PM

## 2013-02-19 NOTE — Progress Notes (Signed)
[facility]  Patient Status: Active (receiving services up to time of hospitalization)  [facility] is providing the following services: RN and TPN  If patient discharges after hours, please call [phone].   [name] [name] 02/19/2013, 2:34 PM

## 2013-02-19 NOTE — Progress Notes (Deleted)
Thank you to Adelene Idler with the COPD [facility] for this referral.  Patient evaluated for community based chronic disease management services with [facility] Care Management Program as a benefit of patient's [employer]. Patient is not appropriate for [facility] services at this time because her last three admissions have been related to COPD, CHF, HTN, CAD, PNA, or Uncontrolled Diabetes.  In with patient at beside.  No family present.  Will collaborate with inpatient team regarding other care needs that may emerge prior to discharge.  Of note, [facility] Care Management services does not replace or interfere with any services that are arranged by inpatient case management or social work.  For additional questions or referrals please contact Anibal Henderson BSN RN [facility] Liaison at [phone].

## 2013-02-19 NOTE — Progress Notes (Signed)
INITIAL NUTRITION ASSESSMENT  DOCUMENTATION CODES Per approved criteria  -Not Applicable   INTERVENTION: TPN per PharmD Diet advancement per MD discretion  NUTRITION DIAGNOSIS: Inadequate oral intake related to inability to eat as evidenced by NPO status.   Goal: Pt to meet >/= 90% of their estimated nutrition needs   Monitor:  TPN initiation/rate Weight Labs Diet advancement/PO intake  Reason for Assessment: Consult  72 y.o. female  Admitting Dx: Uncontrolled nausea and vomiting  ASSESSMENT: 72 yo F with metastatic ovarian cancer, on home TNA for SBO, admitted with uncontrolled nausea and vomiting - now has NG tube in place. Needs continued TNA support. Pt asleep at time of visit; discussed pt with pt's husband present in pt's room. Per husband pt has been eating very little PO due to nausea so, TPN was increased one week ago to provide 1800 kcal daily. Pt has been trying to drink [employer] but, she typically only drinks 1/2 to 2/3 of a portion. Husband reports that pt has been maintaining her weight between 120 and 124 lbs. Husband reports that pt may eat 1/2 cheeseburger or 1/2 taco for dinner but not much more due to nausea.  Height: Ht Readings from Last 1 Encounters:  02/18/13 5\' 4"  (1.626 m)    Weight: Wt Readings from Last 1 Encounters:  02/18/13 124 lb (56.246 kg)    Ideal Body Weight: 120 lbs  % Ideal Body Weight: 103%  Wt Readings from Last 10 Encounters:  02/18/13 124 lb (56.246 kg)  02/16/13 125 lb (56.7 kg)  02/02/13 122 lb 9.6 oz (55.611 kg)  01/26/13 121 lb 9.6 oz (55.157 kg)  01/19/13 123 lb 1.6 oz (55.838 kg)  01/12/13 124 lb 8 oz (56.473 kg)  01/06/13 124 lb (56.246 kg)  01/06/13 124 lb 11.2 oz (56.564 kg)  12/15/12 125 lb 1.6 oz (56.745 kg)  12/08/12 122 lb 8 oz (55.566 kg)    Usual Body Weight: 120-124 lbs  % Usual Body Weight: 100%  BMI:  Body mass index is 21.27 kg/(m^2).  Estimated Nutritional Needs: Kcal:  1630-1860 Protein: 80-90 grams Fluid: 1.7 -2L daily  Skin: intact  Diet Order: NPO  EDUCATION NEEDS: -No education needs identified at this time   Intake/Output Summary (Last 24 hours) at 02/19/13 1233 Last data filed at 02/19/13 1017  Gross per 24 hour  Intake 253.33 ml  Output   2450 ml  Net -2196.67 ml    Last BM: 7/15   Labs:   Recent Labs Lab 02/16/13 1020 02/18/13 1820 02/19/13 0410  NA 136 138 139  K 4.4 3.4* 3.4*  CL  --  101 101  CO2 21* 29 30  BUN 27.3* 25* 21  CREATININE 0.7 0.51 0.55  CALCIUM 9.0 9.1 9.1  MG  --  1.5 1.6  PHOS  --  2.6 3.2  GLUCOSE 157* 137* 115*    CBG (last 3)   Recent Labs  02/18/13 1706 02/19/13 0132 02/19/13 0825  GLUCAP 135* 115* 141*    Scheduled Meds: . albuterol  2.5 mg Nebulization QID  . atenolol  12.5 mg Oral BID  . budesonide-formoterol  2 puff Inhalation BID  . buPROPion  150 mg Oral BID  . busPIRone  15 mg Oral BID  . clopidogrel  75 mg Oral Q breakfast  . enoxaparin (LOVENOX) injection  40 mg Subcutaneous Q24H  . ondansetron  4 mg Intravenous QHS  . pantoprazole (PROTONIX) IV  40 mg Intravenous Q24H  . sertraline  100 mg Oral QAC breakfast    Continuous Infusions: . sodium chloride    . Marland KitchenTPN (CLINIMIX-E) Adult     And  . fat emulsion      Past Medical History  Diagnosis Date  . History of breast cancer   . COPD (chronic obstructive pulmonary disease)   . GERD (gastroesophageal reflux disease)   . Diabetes mellitus     type II  . Hx of colonic polyps   . Osteoarthritis   . MVP (mitral valve prolapse)   . Dysrhythmia     pt states runs a little fast   . Shortness of breath     with exertion   . Depression   . Anxiety   . Heart murmur   . Stroke   . Ascites 10/22/2012  . metastases dx'd 10/2011  . Ovarian cancer dx'd2/2013    active chemotherapy  . Breast CA 1993  . Partial small bowel obstruction 01/2013    Past Surgical History  Procedure Laterality Date  . Cesarean section   1980, 1982    X 2   . Laparoscopy      work up for infertility  . Tympanoplasty      left X 2  . Left breast fibroadenoma removal  1960's  . Breast cyst aspirations    . Left modified radial mastectomy      >20 years ago  . Laparotomy  10/29/2011    Procedure: EXPLORATORY LAPAROTOMY;  Surgeon: Rejeana Brock A. Duard Brady, MD;  Location: [facility];  Service: Gynecology;  Laterality: N/A;  . Abdominal hysterectomy  10/29/2011    Procedure: HYSTERECTOMY ABDOMINAL;  Surgeon: Rejeana Brock A. Duard Brady, MD;  Location: [facility];  Service: Gynecology;  Laterality: N/A;  Total abdominal hysterectomy  . Salpingoophorectomy  10/29/2011    Procedure: SALPINGO OOPHERECTOMY;  Surgeon: Rejeana Brock A. Duard Brady, MD;  Location: [facility];  Service: Gynecology;  Laterality: Bilateral;    Ian Malkin RD, LDN Inpatient Clinical Dietitian Pager: [phone] After Hours Pager: [phone]

## 2013-02-19 NOTE — Progress Notes (Signed)
PARENTERAL NUTRITION CONSULT NOTE   Pharmacy Consult for TNA Indication: SBO/Malnutrition   Allergies  Allergen Reactions  . Promethazine     "Makes me feel like I want to jump out of my skin"    Patient Measurements: Height: 5\' 4"  (162.6 cm) Weight: 124 lb (56.246 kg) IBW/kg (Calculated) : 54.7 Usual Weight:122 lb  Vital Signs: Temp: 98.7 F (37.1 C) (07/18 0600) Temp src: Oral (07/18 0600) BP: 118/69 mmHg (07/18 0600) Pulse Rate: 97 (07/18 0600) Intake/Output from previous day: 07/17 0701 - 07/18 0700 In: 253.3 [I.V.:253.3] Out: 1200 [Urine:400; Emesis/NG output:800] Intake/Output from this shift: Total I/O In: -  Out: 400 [Urine:400]  Labs:  Recent Labs  02/16/13 1019 02/18/13 1820 02/19/13 0410  WBC 8.3 9.9 9.1  HGB 10.2* 10.0* 10.0*  HCT 32.1* 30.8* 31.4*  PLT 320 366 404*     Recent Labs  02/16/13 1020 02/18/13 1820 02/19/13 0410  NA 136 138 139  K 4.4 3.4* 3.4*  CL  --  101 101  CO2 21* 29 30  GLUCOSE 157* 137* 115*  BUN 27.3* 25* 21  CREATININE 0.7 0.51 0.55  CALCIUM 9.0 9.1 9.1  MG  --  1.5 1.6  PHOS  --  2.6 3.2  PROT 7.1 6.9 6.7  ALBUMIN 2.7* 2.7* 2.8*  AST 29 18 19   ALT 27 16 18   ALKPHOS 138 119* 117  BILITOT <0.20 Repeated and Verified 0.2* 0.4   Estimated Creatinine Clearance: 57.3 ml/min (by C-G formula based on Cr of 0.55).    Recent Labs  02/18/13 1706 02/19/13 0132  GLUCAP 135* 115*    Medical History: Past Medical History  Diagnosis Date  . History of breast cancer   . COPD (chronic obstructive pulmonary disease)   . GERD (gastroesophageal reflux disease)   . Diabetes mellitus     type II  . Hx of colonic polyps   . Osteoarthritis   . MVP (mitral valve prolapse)   . Dysrhythmia     pt states runs a little fast   . Shortness of breath     with exertion   . Depression   . Anxiety   . Heart murmur   . Stroke   . Ascites 10/22/2012  . metastases dx'd 10/2011  . Ovarian cancer dx'd2/2013    active  chemotherapy  . Breast CA 1993  . Partial small bowel obstruction 01/2013    Medications:  Scheduled:  . albuterol  2.5 mg Nebulization QID  . atenolol  12.5 mg Oral BID  . budesonide-formoterol  2 puff Inhalation BID  . buPROPion  150 mg Oral BID  . busPIRone  15 mg Oral BID  . clopidogrel  75 mg Oral Q breakfast  . enoxaparin (LOVENOX) injection  40 mg Subcutaneous Q24H  . ondansetron  4 mg Intravenous QHS  . pantoprazole (PROTONIX) IV  40 mg Intravenous Q24H  . sertraline  100 mg Oral QAC breakfast   Infusions:  . sodium chloride    . NONFORMULARY OR COMPOUNDED ITEM 50 mL/hr at 02/18/13 2029   PRN: ALPRAZolam, diazepam, Ipratropium-Albuterol, morphine injection, phenol, prochlorperazine  Insulin Requirements:  Patient adds 30 units regular insulin to TNA each night.  The final volume of her home TNA, including overfill, is 1780 mL, for an insulin concentration of 16.9 units/L   Current Nutrition:   Home TNA per [facility] had been infused daily over 12 hours as follows: 24ml/hr x 1 hr, then 158 ml/hr x 10 hr, then  56ml/hr x1 hr, then off x 10 hr.  This formula provided 85 grams protein/day, 1809 KCal/day.  Staff last night attempted to use TNA from home as ordered but problems were reportedly encountered with the filter and the bag could not be used.    Please note that current [place] policy requires hospital inpatients to have TNA supplied by the inpatient pharmacy rather than using home supply.   Assessment: 72 yo F with metastatic ovarian cancer, on home TNA for SBO, admitted with uncontrolled nausea and vomiting - now has NG tube in place.  Needs continued TNA support.  Labs  Glucose - WNL but TNA not infused last night.  Electrolytes - K still slightly low despite q1h x 2 last night.  LFTs - WNL  TGs - ordered  Prealbumin - ordered   Nutritional Goals:  1800-1900 KCal/day 80 - 90 grams protein per day (Inpatient RD consult  pending)  Plan:  1. KCl IV q1h x 2 2. Tonight at 1800, resume cyclic TNA as follows:  -Clinimix E 5/20 + insulin 17 units/L (34 units/2 L bag), 50 mL/hr x 1 hr, 131 mL/hr x 12 hr, 50 mL/hr x 1 hr, off x 10 hr  -Fat Emulsion 20%, 18 mL/hr x 14 hr, off x 10 hr  -Add multivitamins and trace elements daily.  -Regimen provides 84 grams protein/day, 1950 KCal/day 3. Follow CBGs. 4. F/U on triglyceride and prealbumin results. 5. Await inpatient RD recommendations. 6. BMet tomorrow.  Elie Goody, PharmD, BCPS Pager: [phone] 02/19/2013  7:27 AM

## 2013-02-19 NOTE — Progress Notes (Signed)
After discussion with family, they understand the reason we must use our TNA and our tubing. Their TNA has lipids included and will not infuse through our tubing with our filter. A PIV was started for her to receive maintenance fluids and medications and a y-set connector was removed from her PAC needle extension and family understands that we will not inject any medication through our nutrition.

## 2013-02-20 DIAGNOSIS — E119 Type 2 diabetes mellitus without complications: Secondary | ICD-10-CM

## 2013-02-20 LAB — CBC WITH DIFFERENTIAL/PLATELET
Basophils Relative: 0 % (ref 0–1)
HCT: 28.4 % — ABNORMAL LOW (ref 36.0–46.0)
Hemoglobin: 9.1 g/dL — ABNORMAL LOW (ref 12.0–15.0)
Lymphocytes Relative: 17 % (ref 12–46)
Lymphs Abs: 1.6 10*3/uL (ref 0.7–4.0)
MCHC: 32 g/dL (ref 30.0–36.0)
Monocytes Absolute: 0.9 10*3/uL (ref 0.1–1.0)
Monocytes Relative: 10 % (ref 3–12)
Neutro Abs: 6.7 10*3/uL (ref 1.7–7.7)
Neutrophils Relative %: 72 % (ref 43–77)
RBC: 3.15 MIL/uL — ABNORMAL LOW (ref 3.87–5.11)

## 2013-02-20 LAB — COMPREHENSIVE METABOLIC PANEL
Alkaline Phosphatase: 116 U/L (ref 39–117)
BUN: 25 mg/dL — ABNORMAL HIGH (ref 6–23)
CO2: 27 mEq/L (ref 19–32)
Chloride: 96 mEq/L (ref 96–112)
Creatinine, Ser: 0.5 mg/dL (ref 0.50–1.10)
GFR calc Af Amer: >90 mL/min (ref >90)
GFR calc non Af Amer: >90 mL/min (ref >90)
Glucose, Bld: 220 mg/dL — ABNORMAL HIGH (ref 70–99)
Potassium: 3.4 mEq/L — ABNORMAL LOW (ref 3.5–5.1)
Total Bilirubin: 0.2 mg/dL — ABNORMAL LOW (ref 0.3–1.2)

## 2013-02-20 LAB — GLUCOSE, CAPILLARY

## 2013-02-20 MED ORDER — POTASSIUM CHLORIDE 10 MEQ/100ML IV SOLN
10.0000 meq | INTRAVENOUS | Status: AC
  Administered 2013-02-20 (×3): 10 meq via INTRAVENOUS
  Filled 2013-02-20 (×3): qty 100

## 2013-02-20 MED ORDER — FAT EMULSION 20 % IV EMUL
250.0000 mL | INTRAVENOUS | Status: AC
  Administered 2013-02-20: 250 mL via INTRAVENOUS
  Filled 2013-02-20 (×2): qty 250

## 2013-02-20 MED ORDER — TRACE MINERALS CR-CU-F-FE-I-MN-MO-SE-ZN IV SOLN
INTRAVENOUS | Status: AC
  Administered 2013-02-20: 17:00:00 via INTRAVENOUS
  Filled 2013-02-20: qty 2000

## 2013-02-20 NOTE — Progress Notes (Signed)
PARENTERAL NUTRITION CONSULT NOTE   Pharmacy Consult for TNA Indication: SBO/Malnutrition   Allergies  Allergen Reactions  . Promethazine     "Makes me feel like I want to jump out of my skin"    Patient Measurements: Height: 5\' 4"  (162.6 cm) Weight: 124 lb (56.246 kg) IBW/kg (Calculated) : 54.7 Usual Weight:122 lb  Vital Signs: Temp: 99 F (37.2 C) (07/19 0600) Temp src: Oral (07/19 0600) BP: 110/68 mmHg (07/19 0600) Pulse Rate: 100 (07/19 0600) Intake/Output from previous day: 07/18 0701 - 07/19 0700 In: 3920 [I.V.:2200; TPN:1720] Out: 1700 [Urine:100; Emesis/NG output:1600] Intake/Output from this shift:    Labs:  Recent Labs  02/18/13 1820 02/19/13 0410 02/20/13 0600  WBC 9.9 9.1 9.3  HGB 10.0* 10.0* 9.1*  HCT 30.8* 31.4* 28.4*  PLT 366 404* 377     Recent Labs  02/18/13 1820 02/19/13 0410 02/20/13 0600  NA 138 139 136  K 3.4* 3.4* 3.4*  CL 101 101 96  CO2 29 30 27   GLUCOSE 137* 115* 220*  BUN 25* 21 25*  CREATININE 0.51 0.55 0.50  CALCIUM 9.1 9.1 8.9  MG 1.5 1.6  --   PHOS 2.6 3.2  --   PROT 6.9 6.7 6.1  ALBUMIN 2.7* 2.8* 2.3*  AST 18 19 38*  ALT 16 18 31   ALKPHOS 119* 117 116  BILITOT 0.2* 0.4 0.2*  PREALBUMIN  --  17.6*  --   TRIG  --  197*  --    Estimated Creatinine Clearance: 57.3 ml/min (by C-G formula based on Cr of 0.5).    Recent Labs  02/19/13 1651 02/19/13 2216 02/20/13 0757  GLUCAP 117* 198* 219*    Medical History: Past Medical History  Diagnosis Date  . History of breast cancer   . COPD (chronic obstructive pulmonary disease)   . GERD (gastroesophageal reflux disease)   . Diabetes mellitus     type II  . Hx of colonic polyps   . Osteoarthritis   . MVP (mitral valve prolapse)   . Dysrhythmia     pt states runs a little fast   . Shortness of breath     with exertion   . Depression   . Anxiety   . Heart murmur   . Stroke   . Ascites 10/22/2012  . metastases dx'd 10/2011  . Ovarian cancer dx'd2/2013   active chemotherapy  . Breast CA 1993  . Partial small bowel obstruction 01/2013    Medications:  Scheduled:  . albuterol  2.5 mg Nebulization QID  . atenolol  12.5 mg Oral BID  . budesonide-formoterol  2 puff Inhalation BID  . buPROPion  150 mg Oral BID  . busPIRone  15 mg Oral BID  . clopidogrel  75 mg Oral Q breakfast  . enoxaparin (LOVENOX) injection  40 mg Subcutaneous Q24H  . ondansetron  4 mg Intravenous QHS  . pantoprazole (PROTONIX) IV  40 mg Intravenous Q24H  . potassium chloride  10 mEq Intravenous Q1 Hr x 3  . sertraline  100 mg Oral QAC breakfast   Infusions:  . sodium chloride 100 mL/hr at 02/19/13 1856  . Marland KitchenTPN (CLINIMIX-E) Adult 131 mL/hr at 02/20/13 0500   And  . fat emulsion     PRN: ALPRAZolam, diazepam, Ipratropium-Albuterol, ketorolac, morphine, phenol, prochlorperazine  Insulin Requirements:  Patient adds 30 units regular insulin to TNA each night.  The final volume of her home TNA, including overfill, is 1780 mL, for an insulin concentration of 16.9 units/L  Current Nutrition:   Home TNA per [facility] had been infused daily over 12 hours as follows: 37ml/hr x 1 hr, then 158 ml/hr x 10 hr, then 32ml/hr x1 hr, then off x 10 hr.  This formula provided 85 grams protein/day, 1809 KCal/day.  Staff last night attempted to use TNA from home as ordered but problems were reportedly encountered with the filter and the bag could not be used.    Please note that current [place] policy requires hospital inpatients to have TNA supplied by the inpatient pharmacy rather than using home supply.   Assessment: 72 yo F with metastatic ovarian cancer, on home TNA for SBO, admitted with uncontrolled nausea and vomiting - now has NG tube in place.  Needs continued TNA support.  Labs  Glucose - elevated; CBG 115-219  Electrolytes - K still slightly low despite replacement (4 x IV last 2 days)  LFTs - AST just above ULN, ALT wnl, T bili low  TGs - 197  (7/18)  Prealbumin - 17.6 (7/19)  Estimated Nutritional Needs per RD:  Kcal: 4782-9562  Protein: 80-90 grams  Fluid: 1.7 -2L daily  Plan:  1. KCl IV q1h x 3 2. Tonight at 1800, continue cyclic TNA as follows:  -Clinimix E 5/20 + insulin 17 units/L (34 units/2 L bag), 50 mL/hr x 1 hr, 131 mL/hr x 12 hr, 50 mL/hr x 1 hr, off x 10 hr  -Fat Emulsion 20%, 18 mL/hr x 14 hr, off x 10 hr   -Add multivitamins and trace elements daily.  -Regimen provides 84 grams protein/day, 1950 KCal/day 3. Follow CBGs. 4. BMet tomorrow.  Gwen Her PharmD  [phone] 02/20/2013 8:29 AM

## 2013-02-20 NOTE — Progress Notes (Signed)
Unfortunately, the patient failed clamping of the NG tube. About an hour after the NG tube was clamped, she began to vomit and NG tube was reopened and quite a bit of drainage was noted.  She may need to be considered for a G-tube replaced surgically or by interventional radiology if she cannot have the NG tube taken out.  She is on TNA. She will continue this.  She is complaining of some abdominal discomfort. There is some leg discomfort. No leg swelling is noted. There is no cough or shortness of breath. She's had no fever. Is no bleeding.  Her vital signs are temperature 90.9 pulse 100 heart rate 80 blood pressure 110/67. Lungs are clear bilaterally. Cardiac exam slightly tachycardic but regular. She has no murmurs rubs or bruits. Abdomen is slightly distended. Bowel sounds markedly hypo-active. No masses are noted. There is no obvious fluid. Extremities shows no clubbing cyanosis or edema. Neurological exam shows no focal neurological deficits.  Labs show a white cell count 9.3 hemoglobin 9.1 and platelet count 377. Potassium 3.4. Calcium 8.9 with an albumin of 2.3.  Abdominal film from 7/18 still show persistent small bowel obstruction.  We will repeat an abdominal film tomorrow. She may need a CT scan at some point.  She will not be able to go home this weekend.   Pete E.  Isaiah 41:10

## 2013-02-21 DIAGNOSIS — R180 Malignant ascites: Secondary | ICD-10-CM

## 2013-02-21 DIAGNOSIS — J449 Chronic obstructive pulmonary disease, unspecified: Secondary | ICD-10-CM

## 2013-02-21 DIAGNOSIS — C569 Malignant neoplasm of unspecified ovary: Secondary | ICD-10-CM

## 2013-02-21 DIAGNOSIS — M545 Low back pain: Secondary | ICD-10-CM

## 2013-02-21 DIAGNOSIS — F17200 Nicotine dependence, unspecified, uncomplicated: Secondary | ICD-10-CM

## 2013-02-21 DIAGNOSIS — K56609 Unspecified intestinal obstruction, unspecified as to partial versus complete obstruction: Secondary | ICD-10-CM

## 2013-02-21 LAB — BASIC METABOLIC PANEL
BUN: 21 mg/dL (ref 6–23)
Chloride: 105 mEq/L (ref 96–112)
GFR calc non Af Amer: >90 mL/min (ref >90)
Glucose, Bld: 116 mg/dL — ABNORMAL HIGH (ref 70–99)
Potassium: 3 mEq/L — ABNORMAL LOW (ref 3.5–5.1)
Sodium: 139 mEq/L (ref 135–145)

## 2013-02-21 MED ORDER — TRACE MINERALS CR-CU-F-FE-I-MN-MO-SE-ZN IV SOLN
INTRAVENOUS | Status: AC
  Administered 2013-02-21: 18:00:00 via INTRAVENOUS
  Filled 2013-02-21: qty 2000

## 2013-02-21 MED ORDER — LORAZEPAM 2 MG/ML IJ SOLN
1.0000 mg | Freq: Four times a day (QID) | INTRAMUSCULAR | Status: DC | PRN
  Administered 2013-02-21 – 2013-02-26 (×9): 1 mg via INTRAVENOUS
  Filled 2013-02-21 (×10): qty 1

## 2013-02-21 MED ORDER — MORPHINE SULFATE 2 MG/ML IJ SOLN
2.0000 mg | INTRAMUSCULAR | Status: DC | PRN
  Administered 2013-02-21 – 2013-02-25 (×17): 2 mg via INTRAVENOUS
  Filled 2013-02-21 (×19): qty 1

## 2013-02-21 MED ORDER — POTASSIUM CHLORIDE 10 MEQ/100ML IV SOLN
10.0000 meq | INTRAVENOUS | Status: AC
  Administered 2013-02-21 (×5): 10 meq via INTRAVENOUS
  Filled 2013-02-21 (×5): qty 100

## 2013-02-21 MED ORDER — FAT EMULSION 20 % IV EMUL
250.0000 mL | INTRAVENOUS | Status: AC
  Administered 2013-02-21: 250 mL via INTRAVENOUS
  Filled 2013-02-21 (×2): qty 250

## 2013-02-21 NOTE — Progress Notes (Signed)
[name] [name]   DOB:07-18-1941   WU#:[id number]   GNF#:[id number]  Subjective: Sleeping comfortably this am but she report a tough night again. Still has NG tube with considerable amount coming out. Pain is better and she is reporting small BM and some bowel activity.    Objective: middle aged white woman examined in bed Filed Vitals:   02/21/13 0600  BP: 119/75  Pulse: 100  Temp: 98.7 F (37.1 C)  Resp: 20    Body mass index is 22.82 kg/(m^2).  Intake/Output Summary (Last 24 hours) at 02/21/13 0743 Last data filed at 02/20/13 1850  Gross per 24 hour  Intake   1600 ml  Output    400 ml  Net   1200 ml     Sclerae unicteric  Oropharynx clear, dry  No peripheral adenopathy  Lungs clear -- no rales or rhonchi--auscultated anterolaterally  Heart regular rate and rhythm  Abdomen mildly distended, diminished but present BD, NT to mild palpation  MSK, no peripheral edema  Neuro nonfocal, lethargic  Breast exam: deferred  CBG (last 3)   Recent Labs  02/20/13 0757 02/20/13 1555 02/21/13 0018  GLUCAP 219* 134* 170*     Basic Metabolic Panel:  Recent Labs Lab 02/16/13 1020  02/18/13 1820 02/19/13 0410 02/20/13 0600  NA 136  --  138 139 136  K 4.4  < > 3.4* 3.4* 3.4*  CL  --   --  101 101 96  CO2 21*  --  29 30 27   GLUCOSE 157*  --  137* 115* 220*  BUN 27.3*  --  25* 21 25*  CREATININE 0.7  --  0.51 0.55 0.50  CALCIUM 9.0  --  9.1 9.1 8.9  MG  --   --  1.5 1.6  --   PHOS  --   --  2.6 3.2  --   < > = values in this interval not displayed. GFR Estimated Creatinine Clearance: 57.3 ml/min (by C-G formula based on Cr of 0.5). Liver Function Tests:  Recent Labs Lab 02/16/13 1020 02/18/13 1820 02/19/13 0410 02/20/13 0600  AST 29 18 19  38*  ALT 27 16 18 31   ALKPHOS 138 119* 117 116  BILITOT <0.20 Repeated and Verified 0.2* 0.4 0.2*  PROT 7.1 6.9 6.7 6.1  ALBUMIN 2.7* 2.7* 2.8* 2.3*   No results found for this basename: LIPASE, AMYLASE,  in the last 168  hours No results found for this basename: AMMONIA,  in the last 168 hours Coagulation profile No results found for this basename: INR, PROTIME,  in the last 168 hours  CBC:  Recent Labs Lab 02/16/13 1019 02/18/13 1820 02/19/13 0410 02/20/13 0600  WBC 8.3 9.9 9.1 9.3  NEUTROABS 5.5 7.4 5.8 6.7  HGB 10.2* 10.0* 10.0* 9.1*  HCT 32.1* 30.8* 31.4* 28.4*  MCV 89.7 88.3 89.5 90.2  PLT 320 366 404* 377     Assessment: 72 y.o. 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), admitted with small bowel obstruction (1) status post left modified radical mastectomy October 1993 for a T2 N0 invasive ductal breast, estrogen and progesterone receptor positive cancer treated adjuvantly with MFL (methotrexate/fluorouracil/leucovorin) chemotherapy x6, completed April 1994, followed by 5 years of tamoxifen completed April 1999, with no evidence of recurrence  (2) now status post TAH BSO, omentectomy, and suboptimal debulking 10/29/2011 for a high-grade serous adenocarcinoma of the ovary, pT3c NX (Stage IIIC) , s/p eight cycles of carboplatin/paclitaxel completed  05/05/2012 (initial CA125 of 2050.7 normalized after cycle 5)  (3) recurrent disease documented by rising CEA 125 an abdominal CT scan 09/21/2012.  (4) COPD with ongoing tobacco abuse  (5) diabetes mellitus  (6) genetic testing for Lynch syndrome pending.  (7) status post hospitalization in July 2013 for stroke/small acute infarcts in the left sub insular white matter and coronal radiata.  (8) port in place  (9) left upper lobe 6 mm subpleural nodule, without associated hypermetabolic activity (likely well below the resolution of PET imaging) noted August 2013  (10) CTs obtained in [place] 2014 showed evidence of significant progression of disease with widespread intraperitoneal metastases and a moderate volume of presumably malignant ascites.  (11) treated with Doxil on a Q. three-week basis,  first dose given on 10/06/2012, discontinued after 5 cycles, last dose given on 12/29/2012, secondary to hand/foot syndrome and skin changes.  (12) cholecystitis, with cholecystectomy pending  (13) Hx of recurring ascites, requiring therapeutic paracentesis and Aspira drain, removed on 01/15/13  (14) Malnutrition, on TPN. Apparent infiltration of TPN noted on 12/15/12, resolved  (15) Hx of small bowel obstruction requiring hospitalization, 01/06/2013 - 01/08/2013  (16) carboplatin to be given every 21 days, first dose on 01/26/2013, second dose 02/16/2013  Plan: Continue supportive care today with hydration and NG tube suction. Follow up Xray today.  If clinically better, would try to clamp NG on 7/21 again.   [facility], MD 02/21/2013  7:43 AM

## 2013-02-21 NOTE — Progress Notes (Signed)
PARENTERAL NUTRITION CONSULT NOTE   Pharmacy Consult for TNA Indication: SBO/Malnutrition   Allergies  Allergen Reactions  . Promethazine     "Makes me feel like I want to jump out of my skin"    Patient Measurements: Height: 5\' 4"  (162.6 cm) Weight: 133 lb (60.328 kg) IBW/kg (Calculated) : 54.7 Usual Weight:122 lb  Vital Signs: Temp: 98.7 F (37.1 C) (07/20 0600) Temp src: Oral (07/20 0600) BP: 119/75 mmHg (07/20 0600) Pulse Rate: 100 (07/20 0600) Intake/Output from previous day: 07/19 0701 - 07/20 0700 In: 2400 [I.V.:1600; NG/GT:800] Out: 950 [Urine:400; Emesis/NG output:550] Intake/Output from this shift:    Labs:  Recent Labs  02/18/13 1820 02/19/13 0410 02/20/13 0600  WBC 9.9 9.1 9.3  HGB 10.0* 10.0* 9.1*  HCT 30.8* 31.4* 28.4*  PLT 366 404* 377     Recent Labs  02/18/13 1820 02/19/13 0410 02/20/13 0600 02/21/13 0838  NA 138 139 136 139  K 3.4* 3.4* 3.4* 3.0*  CL 101 101 96 105  CO2 29 30 27 26   GLUCOSE 137* 115* 220* 116*  BUN 25* 21 25* 21  CREATININE 0.51 0.55 0.50 0.43*  CALCIUM 9.1 9.1 8.9 7.2*  MG 1.5 1.6  --   --   PHOS 2.6 3.2  --   --   PROT 6.9 6.7 6.1  --   ALBUMIN 2.7* 2.8* 2.3*  --   AST 18 19 38*  --   ALT 16 18 31   --   ALKPHOS 119* 117 116  --   BILITOT 0.2* 0.4 0.2*  --   PREALBUMIN  --  17.6*  --   --   TRIG  --  197*  --   --    Estimated Creatinine Clearance: 57.3 ml/min (by C-G formula based on Cr of 0.43).    Recent Labs  02/20/13 1555 02/21/13 0018 02/21/13 0824  GLUCAP 134* 170* 144*    Medical History: Past Medical History  Diagnosis Date  . History of breast cancer   . COPD (chronic obstructive pulmonary disease)   . GERD (gastroesophageal reflux disease)   . Diabetes mellitus     type II  . Hx of colonic polyps   . Osteoarthritis   . MVP (mitral valve prolapse)   . Dysrhythmia     pt states runs a little fast   . Shortness of breath     with exertion   . Depression   . Anxiety   . Heart  murmur   . Stroke   . Ascites 10/22/2012  . metastases dx'd 10/2011  . Ovarian cancer dx'd2/2013    active chemotherapy  . Breast CA 1993  . Partial small bowel obstruction 01/2013    Medications:  Scheduled:  . albuterol  2.5 mg Nebulization QID  . atenolol  12.5 mg Oral BID  . budesonide-formoterol  2 puff Inhalation BID  . buPROPion  150 mg Oral BID  . busPIRone  15 mg Oral BID  . clopidogrel  75 mg Oral Q breakfast  . enoxaparin (LOVENOX) injection  40 mg Subcutaneous Q24H  . ondansetron  4 mg Intravenous QHS  . pantoprazole (PROTONIX) IV  40 mg Intravenous Q24H  . potassium chloride  10 mEq Intravenous Q1 Hr x 5  . sertraline  100 mg Oral QAC breakfast   Infusions:  . sodium chloride 100 mL/hr at 02/21/13 1047  . Marland KitchenTPN (CLINIMIX-E) Adult 50 mL/hr at 02/21/13 0730   And  . fat emulsion 250 mL (02/20/13 1727)  . Marland Kitchen  TPN (CLINIMIX-E) Adult     And  . fat emulsion     PRN: ALPRAZolam, diazepam, Ipratropium-Albuterol, ketorolac, LORazepam, morphine, phenol, prochlorperazine  Insulin Requirements:  Patient adds 30 units regular insulin to TNA each night.  The final volume of her home TNA, including overfill, is 1780 mL, for an insulin concentration of 16.9 units/L   Current Nutrition:   Home TNA per [facility] had been infused daily over 12 hours as follows: 44ml/hr x 1 hr, then 158 ml/hr x 10 hr, then 71ml/hr x1 hr, then off x 10 hr.  This formula provided 85 grams protein/day, 1809 KCal/day.  Staff attempted to use TNA from home as ordered 7/17 but problems were reportedly encountered with the filter and the bag could not be used.    Please note that current  policy requires hospital inpatients to have TNA supplied by the inpatient pharmacy rather than using home supply.  Assessment: 72 yo F with metastatic ovarian cancer, on home TNA for SBO, admitted with uncontrolled nausea and vomiting - now has NG tube in place.  Needs continued TNA  support.  Labs  CBG 219, 134, 170, 144  Electrolytes - K low despite replacement (7 x IV last 2 days)  LFTs - AST just above ULN, ALT wnl, T bili low  TGs - 197 (7/18)  Prealbumin - 17.6 (7/19)  Estimated Nutritional Needs per RD:  Kcal: 9629-5284  Protein: 80-90 grams  Fluid: 1.7 -2L daily  Plan:  1. KCl IV q1h x 5 2. Tonight at 1800, continue cyclic TNA as follows: -Clinimix E 5/20 + insulin 17 units/L (34 units/2 L bag), 50 mL/hr x 1 hr, 131 mL/hr x 12 hr, 50 mL/hr x 1 hr, off x 10 hr -Fat Emulsion 20%, 18 mL/hr x 14 hr, off x 10 hr  -Add multivitamins and trace elements daily. -Regimen provides 84 grams protein/day, 1950 KCal/day 3. Follow CBGs. 4. Routine TNA labs in AM  [facility] PharmD  [phone] 02/21/2013 11:01 AM

## 2013-02-22 ENCOUNTER — Inpatient Hospital Stay (HOSPITAL_COMMUNITY): Payer: Medicare Other

## 2013-02-22 LAB — CBC
Hemoglobin: 8.9 g/dL — ABNORMAL LOW (ref 12.0–15.0)
MCH: 29.4 pg (ref 26.0–34.0)
MCV: 89.4 fL (ref 78.0–100.0)
Platelets: 396 10*3/uL (ref 150–400)
RBC: 3.03 MIL/uL — ABNORMAL LOW (ref 3.87–5.11)

## 2013-02-22 LAB — COMPREHENSIVE METABOLIC PANEL
ALT: 15 U/L (ref 0–35)
Alkaline Phosphatase: 107 U/L (ref 39–117)
Chloride: 98 mEq/L (ref 96–112)
GFR calc Af Amer: >90 mL/min (ref >90)
Glucose, Bld: 181 mg/dL — ABNORMAL HIGH (ref 70–99)
Potassium: 3.8 mEq/L (ref 3.5–5.1)
Sodium: 135 mEq/L (ref 135–145)
Total Protein: 6 g/dL (ref 6.0–8.3)

## 2013-02-22 LAB — DIFFERENTIAL
Eosinophils Absolute: 0.1 10*3/uL (ref 0.0–0.7)
Eosinophils Relative: 1 % (ref 0–5)
Lymphs Abs: 2.3 10*3/uL (ref 0.7–4.0)
Monocytes Relative: 9 % (ref 3–12)

## 2013-02-22 MED ORDER — IPRATROPIUM BROMIDE 0.02 % IN SOLN
RESPIRATORY_TRACT | Status: AC
  Filled 2013-02-22: qty 2.5

## 2013-02-22 MED ORDER — IPRATROPIUM-ALBUTEROL 20-100 MCG/ACT IN AERS: 1.0000 | INHALATION_SPRAY | Freq: Four times a day (QID) | RESPIRATORY_TRACT | Status: DC

## 2013-02-22 MED ORDER — ALBUTEROL SULFATE (5 MG/ML) 0.5% IN NEBU
2.5000 mg | INHALATION_SOLUTION | Freq: Four times a day (QID) | RESPIRATORY_TRACT | Status: DC | PRN
  Administered 2013-02-25: 2.5 mg via RESPIRATORY_TRACT
  Filled 2013-02-22 (×2): qty 0.5

## 2013-02-22 MED ORDER — FAT EMULSION 20 % IV EMUL
250.0000 mL | INTRAVENOUS | Status: AC
  Administered 2013-02-22: 250 mL via INTRAVENOUS
  Filled 2013-02-22 (×2): qty 250

## 2013-02-22 MED ORDER — ALBUTEROL SULFATE (5 MG/ML) 0.5% IN NEBU
2.5000 mg | INHALATION_SOLUTION | Freq: Four times a day (QID) | RESPIRATORY_TRACT | Status: DC
  Administered 2013-02-22 – 2013-02-27 (×20): 2.5 mg via RESPIRATORY_TRACT
  Filled 2013-02-22 (×20): qty 0.5

## 2013-02-22 MED ORDER — TRACE MINERALS CR-CU-F-FE-I-MN-MO-SE-ZN IV SOLN
INTRAVENOUS | Status: AC
  Administered 2013-02-22: 18:00:00 via INTRAVENOUS
  Filled 2013-02-22: qty 2000

## 2013-02-22 MED ORDER — ALBUTEROL SULFATE (5 MG/ML) 0.5% IN NEBU: 2.5000 mg | INHALATION_SOLUTION | RESPIRATORY_TRACT | Status: DC

## 2013-02-22 MED ORDER — IPRATROPIUM BROMIDE 0.02 % IN SOLN
0.5000 mg | Freq: Four times a day (QID) | RESPIRATORY_TRACT | Status: DC
  Administered 2013-02-22 – 2013-02-27 (×20): 0.5 mg via RESPIRATORY_TRACT
  Filled 2013-02-22 (×20): qty 2.5

## 2013-02-22 MED ORDER — IPRATROPIUM BROMIDE 0.02 % IN SOLN: 0.5000 mg | RESPIRATORY_TRACT | Status: DC

## 2013-02-22 NOTE — Progress Notes (Signed)
Per abdominal x-ray, the NG in distal esophagus. Called radiology to inquire how much further to insert NG tube.  Radiology recommended inserting NG tube 10 cm further.  Inserted NG tube approximately 10 cm further. Pt tolerated fairly.

## 2013-02-22 NOTE — Progress Notes (Signed)
Thank you to Adelene Idler with the COPD [facility] for this referral. Patient evaluated for community based chronic disease management services with [facility] as a benefit of patient's [employer]. Patient is not appropriate for [facility] services at this time because her last three admissions have not been related to COPD, CHF, HTN, CAD, PNA, or Uncontrolled Diabetes. In with patient at beside. No family present. Will collaborate with inpatient team regarding other care needs that may emerge prior to discharge. Of note, [facility] services does not replace or interfere with any services that are arranged by inpatient case management or social work. For additional questions or referrals please contact Anibal Henderson BSN RN [facility] Liaison at [phone].

## 2013-02-22 NOTE — Progress Notes (Signed)
PARENTERAL NUTRITION CONSULT NOTE   Pharmacy Consult for TNA Indication: SBO/Malnutrition   Allergies  Allergen Reactions  . Promethazine     "Makes me feel like I want to jump out of my skin"    Patient Measurements: Height: 5\' 4"  (162.6 cm) Weight: 133 lb (60.328 kg) IBW/kg (Calculated) : 54.7 Usual Weight:122 lb  Vital Signs: Temp: 98.5 F (36.9 C) (07/21 0654) Temp src: Oral (07/21 0654) BP: 103/65 mmHg (07/21 0654) Pulse Rate: 99 (07/21 0654) Intake/Output from previous day: 07/20 0701 - 07/21 0700 In: 6444.7 [NG/GT:400; WUJ:8119.1] Out: 1600 [Urine:750; Emesis/NG output:850] Intake/Output from this shift:    Labs:  Recent Labs  02/20/13 0600 02/22/13 0800  WBC 9.3 8.7  HGB 9.1* 8.9*  HCT 28.4* 27.1*  PLT 377 396     Recent Labs  02/20/13 0600 02/21/13 0838 02/22/13 0800  NA 136 139 135  K 3.4* 3.0* 3.8  CL 96 105 98  CO2 27 26 31   GLUCOSE 220* 116* 181*  BUN 25* 21 21  CREATININE 0.50 0.43* 0.48*  CALCIUM 8.9 7.2* 8.9  MG  --   --  1.6  PHOS  --   --  4.6  PROT 6.1  --  6.0  ALBUMIN 2.3*  --  2.2*  AST 38*  --  16  ALT 31  --  15  ALKPHOS 116  --  107  BILITOT 0.2*  --  0.2*  TRIG  --   --  94   Estimated Creatinine Clearance: 57.3 ml/min (by C-G formula based on Cr of 0.48).    Recent Labs  02/21/13 1637 02/21/13 2023 02/22/13 0013  GLUCAP 114* 212* 182*    Medical History: Past Medical History  Diagnosis Date  . History of breast cancer   . COPD (chronic obstructive pulmonary disease)   . GERD (gastroesophageal reflux disease)   . Diabetes mellitus     type II  . Hx of colonic polyps   . Osteoarthritis   . MVP (mitral valve prolapse)   . Dysrhythmia     pt states runs a little fast   . Shortness of breath     with exertion   . Depression   . Anxiety   . Heart murmur   . Stroke   . Ascites 10/22/2012  . metastases dx'd 10/2011  . Ovarian cancer dx'd2/2013    active chemotherapy  . Breast CA 1993  . Partial small  bowel obstruction 01/2013    Medications:  Scheduled:  . albuterol  2.5 mg Nebulization QID  . atenolol  12.5 mg Oral BID  . budesonide-formoterol  2 puff Inhalation BID  . buPROPion  150 mg Oral BID  . busPIRone  15 mg Oral BID  . clopidogrel  75 mg Oral Q breakfast  . enoxaparin (LOVENOX) injection  40 mg Subcutaneous Q24H  . ondansetron  4 mg Intravenous QHS  . pantoprazole (PROTONIX) IV  40 mg Intravenous Q24H  . sertraline  100 mg Oral QAC breakfast   Infusions:  . sodium chloride 20 mL/hr at 02/21/13 1136  . Marland KitchenTPN (CLINIMIX-E) Adult 50 mL/hr at 02/22/13 4782   And  . fat emulsion 250 mL (02/21/13 1730)   PRN: ALPRAZolam, diazepam, Ipratropium-Albuterol, ketorolac, LORazepam, morphine, phenol, prochlorperazine  Home Insulin Requirements :  Patient adds 30 units regular insulin to TNA each night.  The final volume of her home TNA, including overfill, is 1780 mL, for an insulin concentration of 16.9 units/L   Home TNA  Regimen:   Home TNA per [facility] had been infused daily over 12 hours as follows: 22ml/hr x 1 hr, then 158 ml/hr x 10 hr, then 24ml/hr x1 hr, then off x 10 hr.  This formula provided 85 grams protein/day, 1809 KCal/day.  Staff attempted to use TNA from home as ordered 7/17 but problems were reportedly encountered with the filter and the bag could not be used.    Please note that current [facility] policy requires hospital inpatients to have TNA supplied by the inpatient pharmacy rather than using home supply.  Estimated Nutritional Needs per RD:  Kcal: 1630-1860  Protein: 80-90 grams  Fluid: 1.7 -2L daily  Current Nutrition: NPO Clinimix E 5/20 + insulin 17 units/L (34 units/2 L bag), 50 mL/hr x 1 hr, 131 mL/hr x 12 hr, 50 mL/hr x 1 hr, off x 10 hr Fat Emulsion 20%, 18 mL/hr x 14 hr, off x 10 hr Multivitamins and trace elements daily. This regimen provides 84 grams protein/day, 1950 KCal/day  Maintenance IVF: [facility] at [facility]  Abd XR:  today shows  persistent SBO  Assessment: 72 yo F with metastatic ovarian cancer, on home TNA for SBO, admitted with uncontrolled nausea and vomiting - now has NG tube in place.  Needs continued TNA support.  Labs  CBG still a bit high while TNA running (182, 212 last night/this AM)  Electrolytes - K replenished; all lytes WNL  LFTs - all below ULN  TGs - WNL  Prealbumin - 17.6 (7/19), pending today  I/O:  Reported as 6445/1600 (+ 4.8L) yesterday Wt:  Up from 56.2 kg on 7/17 to 60.3 kg on 7/20  mIVF was reduced to [facility] yesterday.  Plan:  1. Continue present cyclic TNA regimen 2. Increase insulin to 20 units/L (40 units per 2L bag) 3. Continue TNA labs.  Elie Goody, PharmD, BCPS Pager: [phone] 02/22/2013  11:24 AM

## 2013-02-22 NOTE — Progress Notes (Signed)
I agree with assessment. 

## 2013-02-23 ENCOUNTER — Other Ambulatory Visit: Payer: Medicare Other | Admitting: Lab

## 2013-02-23 ENCOUNTER — Ambulatory Visit: Payer: Medicare Other | Admitting: Oncology

## 2013-02-23 LAB — COMPREHENSIVE METABOLIC PANEL
ALT: 16 U/L (ref 0–35)
Albumin: 2.3 g/dL — ABNORMAL LOW (ref 3.5–5.2)
Alkaline Phosphatase: 114 U/L (ref 39–117)
BUN: 23 mg/dL (ref 6–23)
Chloride: 96 mEq/L (ref 96–112)
GFR calc Af Amer: >90 mL/min (ref >90)
Glucose, Bld: 129 mg/dL — ABNORMAL HIGH (ref 70–99)
Potassium: 4 mEq/L (ref 3.5–5.1)
Total Bilirubin: 0.3 mg/dL (ref 0.3–1.2)

## 2013-02-23 LAB — CBC WITH DIFFERENTIAL/PLATELET
Hemoglobin: 8.9 g/dL — ABNORMAL LOW (ref 12.0–15.0)
Lymphs Abs: 1.8 10*3/uL (ref 0.7–4.0)
Monocytes Relative: 6 % (ref 3–12)
Neutro Abs: 6.3 10*3/uL (ref 1.7–7.7)
Neutrophils Relative %: 73 % (ref 43–77)
RBC: 3.05 MIL/uL — ABNORMAL LOW (ref 3.87–5.11)
WBC: 8.7 10*3/uL (ref 4.0–10.5)

## 2013-02-23 LAB — GLUCOSE, CAPILLARY
Glucose-Capillary: 116 mg/dL — ABNORMAL HIGH (ref 70–99)
Glucose-Capillary: 98 mg/dL (ref 70–99)
Glucose-Capillary: 110 mg/dL — ABNORMAL HIGH (ref 70–99)

## 2013-02-23 LAB — PROTIME-INR: Prothrombin Time: 12.3 seconds (ref 11.6–15.2)

## 2013-02-23 MED ORDER — TRACE MINERALS CR-CU-F-FE-I-MN-MO-SE-ZN IV SOLN
INTRAVENOUS | Status: AC
  Administered 2013-02-23: 18:00:00 via INTRAVENOUS
  Filled 2013-02-23: qty 2000

## 2013-02-23 MED ORDER — FAT EMULSION 20 % IV EMUL
250.0000 mL | INTRAVENOUS | Status: AC
  Administered 2013-02-23: 250 mL via INTRAVENOUS
  Filled 2013-02-23: qty 250

## 2013-02-23 NOTE — Progress Notes (Signed)
[name] [name]   DOB:April 18, 1941   ZO#:[id number]   JWJ#:[id number]  Subjective: .still very uncomfortable; ng repositioned yesterday with increased drainage; still has abdominal pain very intermittently; not passing gas; OOBTC and ambulating some but very frustrated. No family in room   Objective: middle aged white woman examined in bed Filed Vitals:   02/23/13 0555  BP: 114/67  Pulse: 97  Temp: 98.1 F (36.7 C)  Resp: 18    Body mass index is 22.82 kg/(m^2).  Intake/Output Summary (Last 24 hours) at 02/23/13 0856 Last data filed at 02/23/13 0550  Gross per 24 hour  Intake 3535.03 ml  Output   1225 ml  Net 2310.03 ml     Sclerae unicteric  Oropharynx clear, no thrush  No peripheral adenopathy  Lungs clear -- no rales or rhonchi--auscultated anterolaterally  Heart regular rate and rhythm  Abdomen mildly distended, diminished but present BD, NT to mild palpation  MSK, no peripheral edema  Neuro nonfocal, well oriented, frustrated affect  Breast exam: deferred  CBG (last 3)   Recent Labs  02/21/13 2023 02/22/13 0013 02/23/13 0800  GLUCAP 212* 182* 98     Labs:  Lab Results  Component Value Date   WBC 8.7 02/22/2013   HGB 8.9* 02/22/2013   HCT 27.1* 02/22/2013   MCV 89.4 02/22/2013   PLT 396 02/22/2013   NEUTROABS 5.6 02/22/2013    @LASTCHEMISTRY @  Urine Studies No results found for this basename: UACOL, UAPR, USPG, UPH, UTP, UGL, UKET, UBIL, UHGB, UNIT, UROB, ULEU, UEPI, UWBC, URBC, UBAC, CAST, CRYS, UCOM, BILUA,  in the last 72 hours  Basic Metabolic Panel:  Recent Labs Lab 02/18/13 1820 02/19/13 0410 02/20/13 0600 02/21/13 0838 02/22/13 0800  NA 138 139 136 139 135  K 3.4* 3.4* 3.4* 3.0* 3.8  CL 101 101 96 105 98  CO2 29 30 27 26 31   GLUCOSE 137* 115* 220* 116* 181*  BUN 25* 21 25* 21 21  CREATININE 0.51 0.55 0.50 0.43* 0.48*  CALCIUM 9.1 9.1 8.9 7.2* 8.9  MG 1.5 1.6  --   --  1.6  PHOS 2.6 3.2  --   --  4.6   GFR Estimated Creatinine Clearance:  57.3 ml/min (by C-G formula based on Cr of 0.48). Liver Function Tests:  Recent Labs Lab 02/16/13 1020 02/18/13 1820 02/19/13 0410 02/20/13 0600 02/22/13 0800  AST 29 18 19  38* 16  ALT 27 16 18 31 15   ALKPHOS 138 119* 117 116 107  BILITOT <0.20 Repeated and Verified 0.2* 0.4 0.2* 0.2*  PROT 7.1 6.9 6.7 6.1 6.0  ALBUMIN 2.7* 2.7* 2.8* 2.3* 2.2*   No results found for this basename: LIPASE, AMYLASE,  in the last 168 hours No results found for this basename: AMMONIA,  in the last 168 hours Coagulation profile No results found for this basename: INR, PROTIME,  in the last 168 hours  CBC:  Recent Labs Lab 02/16/13 1019 02/18/13 1820 02/19/13 0410 02/20/13 0600 02/22/13 0800  WBC 8.3 9.9 9.1 9.3 8.7  NEUTROABS 5.5 7.4 5.8 6.7 5.6  HGB 10.2* 10.0* 10.0* 9.1* 8.9*  HCT 32.1* 30.8* 31.4* 28.4* 27.1*  MCV 89.7 88.3 89.5 90.2 89.4  PLT 320 366 404* 377 396   Cardiac Enzymes: No results found for this basename: CKTOTAL, CKMB, CKMBINDEX, TROPONINI,  in the last 168 hours BNP: No components found with this basename: POCBNP,  CBG:  Recent Labs Lab 02/21/13 0824 02/21/13 1637 02/21/13 2023 02/22/13 0013 02/23/13 0800  GLUCAP  144* 114* 212* 182* 98   D-Dimer No results found for this basename: DDIMER,  in the last 72 hours Hgb A1c No results found for this basename: HGBA1C,  in the last 72 hours Lipid Profile  Recent Labs  02/22/13 0800  TRIG 94   Thyroid function studies No results found for this basename: TSH, T4TOTAL, FREET3, T3FREE, THYROIDAB,  in the last 72 hours Anemia work up No results found for this basename: VITAMINB12, FOLATE, FERRITIN, TIBC, IRON, RETICCTPCT,  in the last 72 hours Microbiology No results found for this or any previous visit (from the past 240 hour(s)).    Studies:  Dg Abd 1 View  02/22/2013   **ADDENDUM** CREATED: 02/22/2013 18:01:07  For position of the nasogastric tube within the stomach, tube could be advanced approximately 10  cm.  **END ADDENDUM** SIGNED BY: Blair Hailey. Manson Passey, M.D.  02/22/2013   *RADIOLOGY REPORT*  Clinical Data: Abdominal pain, small bowel obstruction  ABDOMEN - 1 VIEW  Comparison: 02/19/2013  Findings: The nasogastric catheter is withdrawn somewhat and now lies within the distal esophagus.  There remain dilated loops of small bowel centrally within the abdomen. The maximum dimension is approximate 4.6 cm.  Stomach appears mildly decompressed.  Minimal colonic gas is noted.  IMPRESSION: Persistent small bowel obstruction.  Nasogastric catheter now lies within the distal esophagus.   Original Report Authenticated By: Alcide Clever, M.D.    Assessment: 72 y.o. 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), admitted with small bowel obstruction (1) status post left modified radical mastectomy October 1993 for a T2 N0 invasive ductal breast, estrogen and progesterone receptor positive cancer treated adjuvantly with MFL (methotrexate/fluorouracil/leucovorin) chemotherapy x6, completed April 1994, followed by 5 years of tamoxifen completed April 1999, with no evidence of recurrence  (2) now status post TAH BSO, omentectomy, and suboptimal debulking 10/29/2011 for a high-grade serous adenocarcinoma of the ovary, pT3c NX (Stage IIIC) , s/p eight cycles of carboplatin/paclitaxel completed 05/05/2012 (initial CA125 of 2050.7 normalized after cycle 5)  (3) recurrent disease documented by rising CEA 125 an abdominal CT scan 09/21/2012.  (4) COPD with ongoing tobacco abuse  (5) diabetes mellitus  (6) genetic testing for Lynch syndrome pending.  (7) status post hospitalization in July 2013 for stroke/small acute infarcts in the left sub insular white matter and coronal radiata.  (8) port in place  (9) left upper lobe 6 mm subpleural nodule, without associated hypermetabolic activity (likely well below the resolution of PET imaging) noted August 2013  (10) CTs  obtained in [place] 2014 showed evidence of significant progression of disease with widespread intraperitoneal metastases and a moderate volume of presumably malignant ascites.  (11) treated with Doxil on a Q. three-week basis, first dose given on 10/06/2012, discontinued after 5 cycles, last dose given on 12/29/2012, secondary to hand/foot syndrome and skin changes.  (12) cholecystitis, with cholecystectomy pending  (13) Hx of recurring ascites, requiring therapeutic paracentesis and Aspira drain, removed on 01/15/13  (14) Malnutrition, on TPN. Apparent infiltration of TPN noted on 12/15/12, resolved  (15) Hx of small bowel obstruction requiring hospitalization, 01/06/2013 - 01/08/2013  (16) carboplatin to be given every 21 days, first dose on 01/26/2013, second dose 02/16/2013  Plan: Quamesha is not ready to have her ng clamped. She hates the ng almost as much as she hates vomiting due to sbo. Since this is a recurrnt problem it may make sense to place a gastrostomy tube for palliation--that way  she could drain her stomach at home PRN and not require admission just for ng suction. The patient is very interested in this possibility (she actually brought up the suggestion!). I have placed an order for IR to consider this. I do think a gastric tube would significantly simplify her care, as unfortunately she is likely to continue to suffer from sbo problems indefinitely.  Lowella Dell, MD 02/23/2013  8:56 AM

## 2013-02-23 NOTE — Progress Notes (Addendum)
Patient ID: [name] [name], female   DOB: 05/18/1941, 72 y.o.   MRN: [id number] Request received for palliative gastrostomy tube placement on pt. Hx noted and imaging studies were reviewed by Dr. Lowella Dandy. Pt has significant peritoneal carcinomatosis/omental caking obstructing clear access for G tube placement . Pt also on plavix. Case was d/w Dr. Darnelle Catalan. Dr. Lowella Dandy to review hx with other colleagues and inquire about appropriate options for tube placement (if feasible). Plans d/w pt.

## 2013-02-23 NOTE — Progress Notes (Signed)
PARENTERAL NUTRITION CONSULT NOTE   Pharmacy Consult for TNA Indication: SBO/Malnutrition   Allergies  Allergen Reactions  . Promethazine     "Makes me feel like I want to jump out of my skin"    Patient Measurements: Height: 5\' 4"  (162.6 cm) Weight: 133 lb (60.328 kg) IBW/kg (Calculated) : 54.7 Usual Weight:122 lb  Vital Signs: Temp: 98.1 F (36.7 C) (07/22 0555) Temp src: Oral (07/22 0555) BP: 114/67 mmHg (07/22 0555) Pulse Rate: 97 (07/22 0555) Intake/Output from previous day: 07/21 0701 - 07/22 0700 In: 3535 [I.V.:2008; TPN:1527] Out: 1225 [Urine:300; Emesis/NG output:925] Intake/Output from this shift:    Labs:  Recent Labs  02/22/13 0800  WBC 8.7  HGB 8.9*  HCT 27.1*  PLT 396     Recent Labs  02/21/13 0838 02/22/13 0800  NA 139 135  K 3.0* 3.8  CL 105 98  CO2 26 31  GLUCOSE 116* 181*  BUN 21 21  CREATININE 0.43* 0.48*  CALCIUM 7.2* 8.9  MG  --  1.6  PHOS  --  4.6  PROT  --  6.0  ALBUMIN  --  2.2*  AST  --  16  ALT  --  15  ALKPHOS  --  107  BILITOT  --  0.2*  PREALBUMIN  --  12.8*  TRIG  --  94   Estimated Creatinine Clearance: 57.3 ml/min (by C-G formula based on Cr of 0.48).    Recent Labs  02/21/13 1637 02/21/13 2023 02/22/13 0013  GLUCAP 114* 212* 182*    Medical History: Past Medical History  Diagnosis Date  . History of breast cancer   . COPD (chronic obstructive pulmonary disease)   . GERD (gastroesophageal reflux disease)   . Diabetes mellitus     type II  . Hx of colonic polyps   . Osteoarthritis   . MVP (mitral valve prolapse)   . Dysrhythmia     pt states runs a little fast   . Shortness of breath     with exertion   . Depression   . Anxiety   . Heart murmur   . Stroke   . Ascites 10/22/2012  . metastases dx'd 10/2011  . Ovarian cancer dx'd2/2013    active chemotherapy  . Breast CA 1993  . Partial small bowel obstruction 01/2013    Medications:  Scheduled:  . ipratropium  0.5 mg Nebulization QID   And  . albuterol  2.5 mg Nebulization QID  . atenolol  12.5 mg Oral BID  . budesonide-formoterol  2 puff Inhalation BID  . buPROPion  150 mg Oral BID  . busPIRone  15 mg Oral BID  . clopidogrel  75 mg Oral Q breakfast  . enoxaparin (LOVENOX) injection  40 mg Subcutaneous Q24H  . ondansetron  4 mg Intravenous QHS  . pantoprazole (PROTONIX) IV  40 mg Intravenous Q24H  . sertraline  100 mg Oral QAC breakfast   Infusions:  . sodium chloride 20 mL/hr at 02/21/13 1136  . Marland KitchenTPN (CLINIMIX-E) Adult 50 mL/hr at 02/22/13 1738   And  . fat emulsion 250 mL (02/22/13 1738)   PRN: albuterol, ALPRAZolam, diazepam, ketorolac, LORazepam, morphine, phenol, prochlorperazine  Home Insulin Requirements :  Patient adds 30 units regular insulin to TNA each night.  The final volume of her home TNA, including overfill, is 1780 mL, for an insulin concentration of 16.9 units/L   Home TNA Regimen:   Home TNA per [facility] had been infused daily over 12  hours as follows: 19ml/hr x 1 hr, then 158 ml/hr x 10 hr, then 39ml/hr x1 hr, then off x 10 hr.  This formula provided 85 grams protein/day, 1809 KCal/day.  Staff attempted to use TNA from home as ordered 7/17 but problems were reportedly encountered with the filter and the bag could not be used.    Please note that current [facility] policy requires hospital inpatients to have TNA supplied by the inpatient pharmacy rather than using home supply.  Estimated Nutritional Needs per RD:  Kcal: 1630-1860  Protein: 80-90 grams  Fluid: 1.7 -2L daily  Current Nutrition: NPO Clinimix E 5/20 + insulin 20 units/L (40 units/2 L bag), 50 mL/hr x 1 hr, 131 mL/hr x 12 hr, 50 mL/hr x 1 hr, off x 10 hr Fat Emulsion 20%, 18 mL/hr x 14 hr, off x 10 hr Multivitamins and trace elements daily. This regimen provides 84 grams protein/day, 1950 KCal/day  Maintenance IVF: NS at [facility]  Abd XR:  today shows persistent SBO  Assessment: 72 yo F with metastatic ovarian  cancer, on home TNA for SBO, admitted with uncontrolled nausea and vomiting - now has NG tube in place.  Needs continued TNA support.  Labs  CBGs: have not been drawn for > 24h.  Electrolytes -No labs this am; all lytes WNL 7/21  LFTs - all below ULN  TGs - WNL  Prealbumin - 17.6 (7/19), 12.8 (7/21)  I/O:  Reported as 3535/1225 (+ 2.3L), NG output=964ml yesterday Wt:  Up from 56.2 kg on 7/17 to 60.3 kg on 7/20  mIVF was reduced to [facility] yesterday.  Plan:  1. Continue present cyclic TNA regimen 2. Inquire about why CBGs not being checked - new orders placed using custom times d/t cyclic TPN 3. Continue TNA labs.  [name] [name], PharmD, BCPS.   Pager: [phone] 02/23/2013  7:06 AM

## 2013-02-24 ENCOUNTER — Other Ambulatory Visit: Payer: Self-pay | Admitting: *Deleted

## 2013-02-24 ENCOUNTER — Inpatient Hospital Stay (HOSPITAL_COMMUNITY): Payer: Medicare Other

## 2013-02-24 DIAGNOSIS — K56609 Unspecified intestinal obstruction, unspecified as to partial versus complete obstruction: Secondary | ICD-10-CM

## 2013-02-24 LAB — BASIC METABOLIC PANEL
BUN: 25 mg/dL — ABNORMAL HIGH (ref 6–23)
CO2: 33 mEq/L — ABNORMAL HIGH (ref 19–32)
Chloride: 95 mEq/L — ABNORMAL LOW (ref 96–112)
Creatinine, Ser: 0.51 mg/dL (ref 0.50–1.10)
GFR calc Af Amer: >90 mL/min (ref >90)

## 2013-02-24 LAB — GLUCOSE, CAPILLARY
Glucose-Capillary: 123 mg/dL — ABNORMAL HIGH (ref 70–99)
Glucose-Capillary: 128 mg/dL — ABNORMAL HIGH (ref 70–99)
Glucose-Capillary: 68 mg/dL — ABNORMAL LOW (ref 70–99)
Glucose-Capillary: 98 mg/dL (ref 70–99)

## 2013-02-24 MED ORDER — DEXAMETHASONE 4 MG PO TABS: ORAL_TABLET | ORAL | Status: DC

## 2013-02-24 MED ORDER — M.V.I. ADULT IV INJ
INJECTION | INTRAVENOUS | Status: AC
  Administered 2013-02-24: 18:00:00 via INTRAVENOUS
  Filled 2013-02-24: qty 2000

## 2013-02-24 MED ORDER — FAT EMULSION 20 % IV EMUL
250.0000 mL | INTRAVENOUS | Status: AC
  Administered 2013-02-24: 250 mL via INTRAVENOUS
  Filled 2013-02-24: qty 250

## 2013-02-24 MED ORDER — DEXTROSE 50 % IV SOLN
25.0000 mL | Freq: Once | INTRAVENOUS | Status: AC | PRN
  Administered 2013-02-24: 25 mL via INTRAVENOUS
  Filled 2013-02-24: qty 50

## 2013-02-24 NOTE — Progress Notes (Signed)
[name] [name]   DOB:02-Oct-1940   YN#:[id number]   QMV#:[id number]  Subjective: "terrible day" yesterday, with abd. discomfort, throat discomfort; not getting OOB much; not passing gas; very discouraged; "I told [name] maybe after my 72th birthday that should be the end." (Birthday is [birth year])-- "I didn't say that to the girls."   Objective: middle aged white woman examined in bed Filed Vitals:   02/24/13 0607  BP: 130/53  Pulse: 114  Temp: 97.8 F (36.6 C)  Resp: 20    Body mass index is 22.44 kg/(m^2).  Intake/Output Summary (Last 24 hours) at 02/24/13 0807 Last data filed at 02/24/13 2841  Gross per 24 hour  Intake   1854 ml  Output    550 ml  Net   1304 ml     Sclerae unicteric  Oropharynx no thrush  No peripheral adenopathy  Lungs clear -- no rales or rhonchi--auscultated anterolaterally  Heart regular rate and rhythm, no murmur appreciated  Abdomen mildly distended, diminished but present BD, NT to mild palpation  MSK, no peripheral edema  Neuro nonfocal, well oriented, discouraged affect  Breast exam: deferred  CBG (last 3)   Recent Labs  02/23/13 1632 02/23/13 2121 02/24/13 0002  GLUCAP 102* 110* 128*   Results for [name], [name] (MRN [id number]) as of 02/24/2013 08:06  Ref. Range 12/15/2012 11:06 01/12/2013 13:53 01/26/2013 13:34 02/16/2013 10:20 02/23/2013 09:40  CA 125 Latest Range: 0.0-30.2 U/mL 475.8 (H) 462.6 (H) 478.4 (H) 370.4 (H) 284.7 (H)    Labs:  Lab Results  Component Value Date   WBC 8.7 02/23/2013   HGB 8.9* 02/23/2013   HCT 27.1* 02/23/2013   MCV 88.9 02/23/2013   PLT 426* 02/23/2013   NEUTROABS 6.3 02/23/2013    @LASTCHEMISTRY @  Urine Studies No results found for this basename: UACOL, UAPR, USPG, UPH, UTP, UGL, UKET, UBIL, UHGB, UNIT, UROB, ULEU, UEPI, UWBC, URBC, UBAC, CAST, CRYS, UCOM, BILUA,  in the last 72 hours  Basic Metabolic Panel:  Recent Labs Lab 02/18/13 1820 02/19/13 0410 02/20/13 0600 02/21/13 0838 02/22/13 0800  02/23/13 0940  NA 138 139 136 139 135 136  K 3.4* 3.4* 3.4* 3.0* 3.8 4.0  CL 101 101 96 105 98 96  CO2 29 30 27 26 31 31   GLUCOSE 137* 115* 220* 116* 181* 129*  BUN 25* 21 25* 21 21 23   CREATININE 0.51 0.55 0.50 0.43* 0.48* 0.51  CALCIUM 9.1 9.1 8.9 7.2* 8.9 8.9  MG 1.5 1.6  --   --  1.6  --   PHOS 2.6 3.2  --   --  4.6  --    GFR Estimated Creatinine Clearance: 57.3 ml/min (by C-G formula based on Cr of 0.51). Liver Function Tests:  Recent Labs Lab 02/18/13 1820 02/19/13 0410 02/20/13 0600 02/22/13 0800 02/23/13 0940  AST 18 19 38* 16 19  ALT 16 18 31 15 16   ALKPHOS 119* 117 116 107 114  BILITOT 0.2* 0.4 0.2* 0.2* 0.3  PROT 6.9 6.7 6.1 6.0 6.2  ALBUMIN 2.7* 2.8* 2.3* 2.2* 2.3*   No results found for this basename: LIPASE, AMYLASE,  in the last 168 hours No results found for this basename: AMMONIA,  in the last 168 hours Coagulation profile  Recent Labs Lab 02/23/13 0940  INR 0.93    CBC:  Recent Labs Lab 02/18/13 1820 02/19/13 0410 02/20/13 0600 02/22/13 0800 02/23/13 0940  WBC 9.9 9.1 9.3 8.7 8.7  NEUTROABS 7.4 5.8 6.7 5.6 6.3  HGB 10.0* 10.0*  9.1* 8.9* 8.9*  HCT 30.8* 31.4* 28.4* 27.1* 27.1*  MCV 88.3 89.5 90.2 89.4 88.9  PLT 366 404* 377 396 426*   Cardiac Enzymes: No results found for this basename: CKTOTAL, CKMB, CKMBINDEX, TROPONINI,  in the last 168 hours BNP: No components found with this basename: POCBNP,  CBG:  Recent Labs Lab 02/22/13 0808 02/23/13 0800 02/23/13 1632 02/23/13 2121 02/24/13 0002  GLUCAP 116* 98 102* 110* 128*   D-Dimer No results found for this basename: DDIMER,  in the last 72 hours Hgb A1c No results found for this basename: HGBA1C,  in the last 72 hours Lipid Profile  Recent Labs  02/22/13 0800  TRIG 94   Thyroid function studies No results found for this basename: TSH, T4TOTAL, FREET3, T3FREE, THYROIDAB,  in the last 72 hours Anemia work up No results found for this basename: VITAMINB12, FOLATE,  FERRITIN, TIBC, IRON, RETICCTPCT,  in the last 72 hours Microbiology No results found for this or any previous visit (from the past 240 hour(s)).    Studies:  Dg Abd 1 View  02/22/2013   **ADDENDUM** CREATED: 02/22/2013 18:01:07  For position of the nasogastric tube within the stomach, tube could be advanced approximately 10 cm.  **END ADDENDUM** SIGNED BY: Blair Hailey. Manson Passey, M.D.  02/22/2013   *RADIOLOGY REPORT*  Clinical Data: Abdominal pain, small bowel obstruction  ABDOMEN - 1 VIEW  Comparison: 02/19/2013  Findings: The nasogastric catheter is withdrawn somewhat and now lies within the distal esophagus.  There remain dilated loops of small bowel centrally within the abdomen. The maximum dimension is approximate 4.6 cm.  Stomach appears mildly decompressed.  Minimal colonic gas is noted.  IMPRESSION: Persistent small bowel obstruction.  Nasogastric catheter now lies within the distal esophagus.   Original Report Authenticated By: Alcide Clever, M.D.    Assessment: 72 y.o. 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), admitted with small bowel obstruction (1) status post left modified radical mastectomy October 1993 for a T2 N0 invasive ductal breast, estrogen and progesterone receptor positive cancer treated adjuvantly with MFL (methotrexate/fluorouracil/leucovorin) chemotherapy x6, completed April 1994, followed by 5 years of tamoxifen completed April 1999, with no evidence of recurrence  (2) now status post TAH BSO, omentectomy, and suboptimal debulking 10/29/2011 for a high-grade serous adenocarcinoma of the ovary, pT3c NX (Stage IIIC) , s/p eight cycles of carboplatin/paclitaxel completed 05/05/2012 (initial CA125 of 2050.7 normalized after cycle 5)  (3) recurrent disease documented by rising CEA 125 an abdominal CT scan 09/21/2012.  (4) COPD with ongoing tobacco abuse  (5) diabetes mellitus  (6) genetic testing for Lynch syndrome  pending.  (7) status post hospitalization in July 2013 for stroke/small acute infarcts in the left sub insular white matter and coronal radiata.  (8) port in place  (9) left upper lobe 6 mm subpleural nodule, without associated hypermetabolic activity (likely well below the resolution of PET imaging) noted August 2013  (10) CTs obtained in [place] 2014 showed evidence of significant progression of disease with widespread intraperitoneal metastases and a moderate volume of presumably malignant ascites.  (11) treated with Doxil on a Q. three-week basis, first dose given on 10/06/2012, discontinued after 5 cycles, last dose given on 12/29/2012, secondary to hand/foot syndrome and skin changes.  (12) cholecystitis, with cholecystectomy pending  (13) Hx of recurring ascites, requiring therapeutic paracentesis and Aspira drain, removed on 01/15/13  (14) Malnutrition, on TPN. Apparent infiltration of TPN noted on 12/15/12, resolved  (  15) Hx of small bowel obstruction requiring hospitalization, 01/06/2013 - 01/08/2013  (16) carboplatin to be given every 21 days, first dose on 01/26/2013, second dose 02/16/2013  Plan: We really have 2 problems. One is the ovarian cancer. That seems to be responding to chemo (per CA 125) and she is tolerating treatment well. The other problem is the recurrent/ persisten SBO. This is not improving though the cancer is responding. The SBO may be related to prior surgery, scar tissue, etc, as well as tumor.  Yestrday I consulted IR regarding placement of a gastric tube. They feel there is a wide area of tumor between the skin and the stomach--they do not think it is safe or advisable to go through that.  I have previously discussed with GYN ONC and thery do not feel surgery is an option. Perhaps we should review that.  Given her comments, I offered her a palliative care consult; I explained this would be primarily to discuss options and help her and her family make a decision. She  will think about it. In the meantime I have encouraged her to get OOB and ambulate as much as tolerated. Will repeat a KUB today. Lowella Dell, MD 02/24/2013  8:07 AM

## 2013-02-24 NOTE — Consult Note (Signed)
Reason for Consult: Recurrent Small Bowel Obstruction Referring Physician: Dr. Raymond Gurney Magrinat  [name] [name] is an 72 y.o. female.  HPI:  72 yo white female has history of breast cancer and paracentesis consistent with metastatic adenocarcinoma and is currently being treated with chemotherapy. Presents to [facility] floor with nausea and vomiting on 02/16/2013. Also complains of sharp lower quadrant pains that wane and wax spontaneously. Nausea is somewhat resolved with NG tube placement. Is passing minimal gas, and has not had bowel movement in 7 days. Patient is asking for "g tube" because daughter has suggested it and IR says they cannot place due to adhesions. One month ago had SBO that resolved with medical management.  Past Medical History  Diagnosis Date  . History of breast cancer   . COPD (chronic obstructive pulmonary disease)   . GERD (gastroesophageal reflux disease)   . Diabetes mellitus     type II  . Hx of colonic polyps   . Osteoarthritis   . MVP (mitral valve prolapse)   . Dysrhythmia     pt states runs a little fast   . Shortness of breath     with exertion   . Depression   . Anxiety   . Heart murmur   . Stroke   . Ascites 10/22/2012  . metastases dx'd 10/2011  . Ovarian cancer dx'd2/2013    active chemotherapy  . Breast CA 1993  . Partial small bowel obstruction 01/2013    Past Surgical History  Procedure Laterality Date  . Cesarean section  1980, 1982    X 2   . Laparoscopy      work up for infertility  . Tympanoplasty      left X 2  . Left breast fibroadenoma removal  1960's  . Breast cyst aspirations    . Left modified radial mastectomy      >20 years ago  . Laparotomy  10/29/2011    Procedure: EXPLORATORY LAPAROTOMY;  Surgeon: Rejeana Brock A. Duard Brady, MD;  Location: [facility];  Service: Gynecology;  Laterality: N/A;  . Abdominal hysterectomy  10/29/2011    Procedure: HYSTERECTOMY ABDOMINAL;  Surgeon: Rejeana Brock A. Duard Brady, MD;  Location: [facility];  Service: Gynecology;   Laterality: N/A;  Total abdominal hysterectomy  . Salpingoophorectomy  10/29/2011    Procedure: SALPINGO OOPHERECTOMY;  Surgeon: Rejeana Brock A. Duard Brady, MD;  Location: [facility];  Service: Gynecology;  Laterality: Bilateral;    Family History  Problem Relation Age of Onset  . Cancer Mother     endometrial/ colon  . Cancer Brother     colon  . COPD Other   . Hypertension Other   . Cancer Maternal Uncle     prostate/bladder/lung  . Cancer Paternal Aunt     unknown abdominal cancer  . Cancer Maternal Grandmother     Salivary gland cancer  . Cancer Paternal Aunt     leukemia    Social History:  reports that she has been smoking Cigarettes.  She has a 15 pack-year smoking history. She has never used smokeless tobacco. She reports that she does not drink alcohol or use illicit drugs.  Allergies:  Allergies  Allergen Reactions  . Promethazine     "Makes me feel like I want to jump out of my skin"    Medications: I have reviewed the patient's current medications.  Results for orders placed during the hospital encounter of 02/18/13 (from the past 48 hour(s))  GLUCOSE, CAPILLARY     Status: None   Collection  Time    02/23/13  8:00 AM      Result Value Range   Glucose-Capillary 98  70 - 99 mg/dL   Comment 1 Notify RN    CBC WITH DIFFERENTIAL     Status: Abnormal   Collection Time    02/23/13  9:40 AM      Result Value Range   WBC 8.7  4.0 - 10.5 K/uL   RBC 3.05 (*) 3.87 - 5.11 MIL/uL   Hemoglobin 8.9 (*) 12.0 - 15.0 g/dL   HCT 16.1 (*) 09.6 - 04.5 %   MCV 88.9  78.0 - 100.0 fL   MCH 29.2  26.0 - 34.0 pg   MCHC 32.8  30.0 - 36.0 g/dL   RDW 40.9 (*) 81.1 - 91.4 %   Platelets 426 (*) 150 - 400 K/uL   Neutrophils Relative % 73  43 - 77 %   Neutro Abs 6.3  1.7 - 7.7 K/uL   Lymphocytes Relative 20  12 - 46 %   Lymphs Abs 1.8  0.7 - 4.0 K/uL   Monocytes Relative 6  3 - 12 %   Monocytes Absolute 0.5  0.1 - 1.0 K/uL   Eosinophils Relative 1  0 - 5 %   Eosinophils Absolute 0.1  0.0 - 0.7  K/uL   Basophils Relative 0  0 - 1 %   Basophils Absolute 0.0  0.0 - 0.1 K/uL  COMPREHENSIVE METABOLIC PANEL     Status: Abnormal   Collection Time    02/23/13  9:40 AM      Result Value Range   Sodium 136  135 - 145 mEq/L   Potassium 4.0  3.5 - 5.1 mEq/L   Chloride 96  96 - 112 mEq/L   CO2 31  19 - 32 mEq/L   Glucose, Bld 129 (*) 70 - 99 mg/dL   BUN 23  6 - 23 mg/dL   Creatinine, Ser 7.82  0.50 - 1.10 mg/dL   Calcium 8.9  8.4 - 95.6 mg/dL   Total Protein 6.2  6.0 - 8.3 g/dL   Albumin 2.3 (*) 3.5 - 5.2 g/dL   AST 19  0 - 37 U/L   ALT 16  0 - 35 U/L   Alkaline Phosphatase 114  39 - 117 U/L   Total Bilirubin 0.3  0.3 - 1.2 mg/dL   GFR calc non Af Amer >90  >90 mL/min   GFR calc Af Amer >90  >90 mL/min   Comment:            The eGFR has been calculated     using the CKD EPI equation.     This calculation has not been     validated in all clinical     situations.     eGFR's persistently     <90 mL/min signify     possible Chronic Kidney Disease.  PROTIME-INR     Status: None   Collection Time    02/23/13  9:40 AM      Result Value Range   Prothrombin Time 12.3  11.6 - 15.2 seconds   INR 0.93  0.00 - 1.49  CA 125     Status: Abnormal   Collection Time    02/23/13  9:40 AM      Result Value Range   CA 125 284.7 (*) 0.0 - 30.2 U/mL  GLUCOSE, CAPILLARY     Status: Abnormal   Collection Time    02/23/13  4:32 PM  Result Value Range   Glucose-Capillary 102 (*) 70 - 99 mg/dL   Comment 1 Notify RN    GLUCOSE, CAPILLARY     Status: Abnormal   Collection Time    02/23/13  9:21 PM      Result Value Range   Glucose-Capillary 110 (*) 70 - 99 mg/dL  GLUCOSE, CAPILLARY     Status: Abnormal   Collection Time    02/24/13 12:02 AM      Result Value Range   Glucose-Capillary 128 (*) 70 - 99 mg/dL  GLUCOSE, CAPILLARY     Status: Abnormal   Collection Time    02/24/13  7:46 AM      Result Value Range   Glucose-Capillary 68 (*) 70 - 99 mg/dL  BASIC METABOLIC PANEL      Status: Abnormal   Collection Time    02/24/13  8:15 AM      Result Value Range   Sodium 134 (*) 135 - 145 mEq/L   Potassium 3.8  3.5 - 5.1 mEq/L   Chloride 95 (*) 96 - 112 mEq/L   CO2 33 (*) 19 - 32 mEq/L   Glucose, Bld 178 (*) 70 - 99 mg/dL   BUN 25 (*) 6 - 23 mg/dL   Creatinine, Ser 1.61  0.50 - 1.10 mg/dL   Calcium 8.6  8.4 - 09.6 mg/dL   GFR calc non Af Amer >90  >90 mL/min   GFR calc Af Amer >90  >90 mL/min   Comment:            The eGFR has been calculated     using the CKD EPI equation.     This calculation has not been     validated in all clinical     situations.     eGFR's persistently     <90 mL/min signify     possible Chronic Kidney Disease.  MAGNESIUM     Status: None   Collection Time    02/24/13  8:15 AM      Result Value Range   Magnesium 1.6  1.5 - 2.5 mg/dL  GLUCOSE, CAPILLARY     Status: None   Collection Time    02/24/13  8:52 AM      Result Value Range   Glucose-Capillary 98  70 - 99 mg/dL    Dg Abd 1 View  0/45/4098   *RADIOLOGY REPORT*  Clinical Data: Small bowel obstruction.  ABDOMEN - 1 VIEW  Comparison: 7/21, 7/19, 7/17, and 01/07/2013  Findings: Tip of the NG tube is in the fundus of the stomach and could be advanced.  The dilated small bowel in the mid abdomen and pelvis is slightly less prominent.  The colon is not distended.  There is evidence of ascites.  No acute osseous abnormality.  No visible free air on this supine radiograph.  IMPRESSION:  1.  Slight decreased distention of small bowel in the mid abdomen. 2.  NG tube tip is in the fundus of the stomach and could be advanced 10-15 cm.   Original Report Authenticated By: Francene Boyers, M.D.    Review of Systems  Constitutional: Positive for fever. Negative for chills.       Subjective fever yesterday that has since resolved  HENT: Negative for congestion and sore throat.   Eyes: Negative.   Respiratory: Positive for shortness of breath. Negative for cough, hemoptysis and wheezing.         Always has shortness of breath  Cardiovascular: Positive  for palpitations. Negative for chest pain and leg swelling.       Always feels like heart is beating fast  Gastrointestinal: Positive for nausea, abdominal pain and constipation. Negative for heartburn, vomiting, diarrhea and blood in stool.  Genitourinary: Negative.   Skin: Negative.   Neurological: Negative.    Blood pressure 130/53, pulse 114, temperature 97.8 F (36.6 C), temperature source Oral, resp. rate 20, height 5\' 4"  (1.626 m), weight 59.33 kg (130 lb 12.8 oz), SpO2 99.00%. Physical Exam  Constitutional: She is oriented to person, place, and time. She appears well-developed and well-nourished.  Appears frail  HENT:  Head: Normocephalic and atraumatic.  Eyes: EOM are normal. Pupils are equal, round, and reactive to light.  Neck: No tracheal deviation present. No thyromegaly present.  Cardiovascular: Normal rate, regular rhythm, normal heart sounds and intact distal pulses.  Exam reveals no gallop and no friction rub.   No murmur heard. Respiratory: Effort normal and breath sounds normal. No respiratory distress. She has no wheezes. She has no rales. She exhibits no tenderness.  GI: Normal appearance and normal aorta. She exhibits distension and mass. She exhibits no shifting dullness, no pulsatile liver, no fluid wave, no abdominal bruit and no pulsatile midline mass. Bowel sounds are decreased. There is no hepatosplenomegaly. There is tenderness in the right upper quadrant. There is no rebound and no guarding.  Neurological: She is alert and oriented to person, place, and time. She has normal reflexes.  Skin: Skin is warm and dry. She is not diaphoretic.    Assessment 73 yo white woman history of high grade serous ovarian cancer treated March 2013 with TAH/BSO and peritoneal debridement;  Now with  small bowel obstruction related to peritoneal implants.    Recurrent SBO   Plan: 1. Laparotomy with hopeful internal  bypass or gastrostomy tube placement.    Patient and family aware of the limitations of this approach and potential complications.     Georgina Quint 02/24/2013, 1:53 PM   Seen and agree. Matt B. Daphine Deutscher, MD, FACS

## 2013-02-24 NOTE — Progress Notes (Addendum)
PARENTERAL NUTRITION CONSULT NOTE   Pharmacy Consult for TNA Indication: SBO/Malnutrition   Allergies  Allergen Reactions  . Promethazine     "Makes me feel like I want to jump out of my skin"    Patient Measurements: Height: 5\' 4"  (162.6 cm) Weight: 130 lb 12.8 oz (59.33 kg) IBW/kg (Calculated) : 54.7 Usual Weight:122 lb  Vital Signs: Temp: 97.8 F (36.6 C) (07/23 0607) Temp src: Oral (07/23 0607) BP: 130/53 mmHg (07/23 0607) Pulse Rate: 114 (07/23 0607) Intake/Output from previous day: 07/22 0701 - 07/23 0700 In: 1854 [I.V.:176; TPN:1678] Out: 550 [Emesis/NG output:550] Intake/Output from this shift:    Labs:  Recent Labs  02/22/13 0800 02/23/13 0940  WBC 8.7 8.7  HGB 8.9* 8.9*  HCT 27.1* 27.1*  PLT 396 426*  INR  --  0.93     Recent Labs  02/21/13 0838 02/22/13 0800 02/23/13 0940  NA 139 135 136  K 3.0* 3.8 4.0  CL 105 98 96  CO2 26 31 31   GLUCOSE 116* 181* 129*  BUN 21 21 23   CREATININE 0.43* 0.48* 0.51  CALCIUM 7.2* 8.9 8.9  MG  --  1.6  --   PHOS  --  4.6  --   PROT  --  6.0 6.2  ALBUMIN  --  2.2* 2.3*  AST  --  16 19  ALT  --  15 16  ALKPHOS  --  107 114  BILITOT  --  0.2* 0.3  PREALBUMIN  --  12.8*  --   TRIG  --  94  --    Estimated Creatinine Clearance: 57.3 ml/min (by C-G formula based on Cr of 0.51).    Recent Labs  02/23/13 1632 02/23/13 2121 02/24/13 0002  GLUCAP 102* 110* 128*    Medical History: Past Medical History  Diagnosis Date  . History of breast cancer   . COPD (chronic obstructive pulmonary disease)   . GERD (gastroesophageal reflux disease)   . Diabetes mellitus     type II  . Hx of colonic polyps   . Osteoarthritis   . MVP (mitral valve prolapse)   . Dysrhythmia     pt states runs a little fast   . Shortness of breath     with exertion   . Depression   . Anxiety   . Heart murmur   . Stroke   . Ascites 10/22/2012  . metastases dx'd 10/2011  . Ovarian cancer dx'd2/2013    active chemotherapy  .  Breast CA 1993  . Partial small bowel obstruction 01/2013    Medications:  Scheduled:  . ipratropium  0.5 mg Nebulization QID   And  . albuterol  2.5 mg Nebulization QID  . atenolol  12.5 mg Oral BID  . budesonide-formoterol  2 puff Inhalation BID  . buPROPion  150 mg Oral BID  . busPIRone  15 mg Oral BID  . clopidogrel  75 mg Oral Q breakfast  . enoxaparin (LOVENOX) injection  40 mg Subcutaneous Q24H  . ondansetron  4 mg Intravenous QHS  . pantoprazole (PROTONIX) IV  40 mg Intravenous Q24H  . sertraline  100 mg Oral QAC breakfast   Infusions:  . sodium chloride 10 mL/hr at 02/23/13 1502  . Marland KitchenTPN (CLINIMIX-E) Adult 131 mL/hr at 02/23/13 1900   And  . fat emulsion 250 mL (02/23/13 1802)   PRN: albuterol, ALPRAZolam, diazepam, ketorolac, LORazepam, morphine, phenol, prochlorperazine  Home Insulin Requirements :  Patient adds 30 units regular insulin to  TNA each night.  The final volume of her home TNA, including overfill, is 1780 mL, for an insulin concentration of 16.9 units/L   Home TNA Regimen:   Home TNA per [facility] had been infused daily over 12 hours as follows: 79ml/hr x 1 hr, then 158 ml/hr x 10 hr, then 44ml/hr x1 hr, then off x 10 hr.  This formula provided 85 grams protein/day, 1809 KCal/day.  Staff attempted to use TNA from home as ordered 7/17 but problems were reportedly encountered with the filter and the bag could not be used.    Please note that current [facility] policy requires hospital inpatients to have TNA supplied by the inpatient pharmacy rather than using home supply.  Exploring options of G-tube for recurrent SBO and possibility to avoid multiple admissions caused by this, IR currently does not feel she is a candidate d/t peritoneal carcinomatosis  Estimated Nutritional Needs per RD:  Kcal: 1630-1860  Protein: 80-90 grams  Fluid: 1.7 -2L daily  Current Nutrition: NPO Clinimix E 5/20 + insulin 20 units/L (40 units/2 L bag), 50 mL/hr x 1  hr, 131 mL/hr x 12 hr, 50 mL/hr x 1 hr, off x 10 hr Fat Emulsion 20%, 18 mL/hr x 14 hr, off x 10 hr Multivitamins and trace elements daily. This regimen provides 84 grams protein/day, 1950 KCal/day  Maintenance IVF: NS at [facility]  Abd XR:  7/21 persistent SBO  Assessment: 72 yo F with metastatic ovarian cancer, on home TNA for SBO, admitted with uncontrolled nausea and vomiting - now has NG tube in place.  Needs continued TNA support.  Labs  CBGs: controlled, at goal 150mg /dl, BG while ramping TPN off dipped to 68  Electrolytes -Labs pending  LFTs - all below ULN  TGs - WNL  Prealbumin - 17.6 (7/19), 12.8 (7/21)  I/O:  NG output=565ml yesterday - improvement Wt:  Up from 56.2 kg on 7/17 to 59.3 kg on 7/23  Plan:  1. Continue present cyclic TNA regimen 2. Continue TNA labs. 3. Due to hypoglycemia while weaning TPN down, reduce insulin to 17.5 units/L or 35 units/bag  Juliette Alcide, PharmD, BCPS.   Pager: [phone] 02/24/2013  7:12 AM

## 2013-02-25 ENCOUNTER — Encounter (HOSPITAL_COMMUNITY): Payer: Self-pay | Admitting: *Deleted

## 2013-02-25 ENCOUNTER — Other Ambulatory Visit: Payer: Self-pay | Admitting: *Deleted

## 2013-02-25 ENCOUNTER — Inpatient Hospital Stay (HOSPITAL_COMMUNITY): Payer: Medicare Other

## 2013-02-25 DIAGNOSIS — C569 Malignant neoplasm of unspecified ovary: Secondary | ICD-10-CM

## 2013-02-25 DIAGNOSIS — E46 Unspecified protein-calorie malnutrition: Secondary | ICD-10-CM

## 2013-02-25 LAB — COMPREHENSIVE METABOLIC PANEL
AST: 16 U/L (ref 0–37)
Albumin: 2.1 g/dL — ABNORMAL LOW (ref 3.5–5.2)
Calcium: 8.5 mg/dL (ref 8.4–10.5)
Creatinine, Ser: 0.51 mg/dL (ref 0.50–1.10)
Total Protein: 5.9 g/dL — ABNORMAL LOW (ref 6.0–8.3)

## 2013-02-25 LAB — PHOSPHORUS: Phosphorus: 4.2 mg/dL (ref 2.3–4.6)

## 2013-02-25 LAB — GLUCOSE, CAPILLARY
Glucose-Capillary: 149 mg/dL — ABNORMAL HIGH (ref 70–99)
Glucose-Capillary: 163 mg/dL — ABNORMAL HIGH (ref 70–99)
Glucose-Capillary: 209 mg/dL — ABNORMAL HIGH (ref 70–99)

## 2013-02-25 MED ORDER — SODIUM CHLORIDE 0.9 % IJ SOLN
10.0000 mL | INTRAMUSCULAR | Status: DC | PRN
  Administered 2013-02-25 – 2013-02-26 (×2): 10 mL
  Administered 2013-03-02: 20 mL

## 2013-02-25 MED ORDER — FAT EMULSION 20 % IV EMUL
250.0000 mL | INTRAVENOUS | Status: AC
  Administered 2013-02-25: 250 mL via INTRAVENOUS
  Filled 2013-02-25: qty 250

## 2013-02-25 MED ORDER — HEPARIN SODIUM (PORCINE) 5000 UNIT/ML IJ SOLN
5000.0000 [IU] | Freq: Three times a day (TID) | INTRAMUSCULAR | Status: AC
  Administered 2013-02-25: 5000 [IU] via SUBCUTANEOUS
  Filled 2013-02-25 (×2): qty 1

## 2013-02-25 MED ORDER — MORPHINE SULFATE 2 MG/ML IJ SOLN
2.0000 mg | INTRAMUSCULAR | Status: DC | PRN
  Administered 2013-02-25: 3 mg via INTRAVENOUS
  Administered 2013-02-25 (×2): 4 mg via INTRAVENOUS
  Administered 2013-02-26: 3 mg via INTRAVENOUS
  Administered 2013-02-26 – 2013-02-27 (×5): 4 mg via INTRAVENOUS
  Administered 2013-02-27: 2 mg via INTRAVENOUS
  Filled 2013-02-25 (×4): qty 2
  Filled 2013-02-25: qty 1
  Filled 2013-02-25 (×5): qty 2

## 2013-02-25 MED ORDER — TRACE MINERALS CR-CU-F-FE-I-MN-MO-SE-ZN IV SOLN
INTRAVENOUS | Status: AC
  Administered 2013-02-25: 19:00:00 via INTRAVENOUS
  Filled 2013-02-25: qty 2000

## 2013-02-25 NOTE — Progress Notes (Signed)
[name] [name]   DOB:05/09/1941   ZO#:[id number]   JWJ#:[id number]  Subjective: . Walked "a lot" yesterday. Very uncomfortable with ng tube. Hates the bed alarm, but yesterday there was water on the floor and she did not seem to be aware; of course she is also tethered to the ng etc.No family in room.    Objective: middle aged white woman examined in bed Filed Vitals:   02/25/13 0654  BP: 123/65  Pulse: 102  Temp: 98.4 F (36.9 C)  Resp: 20    Body mass index is 22.44 kg/(m^2).  Intake/Output Summary (Last 24 hours) at 02/25/13 0711 Last data filed at 02/25/13 0500  Gross per 24 hour  Intake      0 ml  Output   1050 ml  Net  -1050 ml     Sclerae unicteric  Oropharynx clear  No peripheral adenopathy  Lungs clear -- no rales or rhonchi--auscultated anterolaterally  Heart regular rate and rhythm  Abdomen mildly distended, diminished but present BD, NT to mild palpation  MSK, no peripheral edema  Neuro nonfocal, well oriented, frustrated affect  Breast exam: deferred  CBG (last 3)   Recent Labs  02/24/13 1140 02/24/13 2006 02/24/13 2357  GLUCAP 123* 163* 209*     Labs:  Lab Results  Component Value Date   WBC 8.7 02/23/2013   HGB 8.9* 02/23/2013   HCT 27.1* 02/23/2013   MCV 88.9 02/23/2013   PLT 426* 02/23/2013   NEUTROABS 6.3 02/23/2013    @LASTCHEMISTRY @  Urine Studies No results found for this basename: UACOL, UAPR, USPG, UPH, UTP, UGL, UKET, UBIL, UHGB, UNIT, UROB, ULEU, UEPI, UWBC, URBC, UBAC, CAST, CRYS, UCOM, BILUA,  in the last 72 hours  Basic Metabolic Panel:  Recent Labs Lab 02/18/13 1820 02/19/13 0410 02/20/13 0600 02/21/13 0838 02/22/13 0800 02/23/13 0940 02/24/13 0815  NA 138 139 136 139 135 136 134*  K 3.4* 3.4* 3.4* 3.0* 3.8 4.0 3.8  CL 101 101 96 105 98 96 95*  CO2 29 30 27 26 31 31  33*  GLUCOSE 137* 115* 220* 116* 181* 129* 178*  BUN 25* 21 25* 21 21 23  25*  CREATININE 0.51 0.55 0.50 0.43* 0.48* 0.51 0.51  CALCIUM 9.1 9.1 8.9 7.2* 8.9  8.9 8.6  MG 1.5 1.6  --   --  1.6  --  1.6  PHOS 2.6 3.2  --   --  4.6  --   --    GFR Estimated Creatinine Clearance: 57.3 ml/min (by C-G formula based on Cr of 0.51). Liver Function Tests:  Recent Labs Lab 02/18/13 1820 02/19/13 0410 02/20/13 0600 02/22/13 0800 02/23/13 0940  AST 18 19 38* 16 19  ALT 16 18 31 15 16   ALKPHOS 119* 117 116 107 114  BILITOT 0.2* 0.4 0.2* 0.2* 0.3  PROT 6.9 6.7 6.1 6.0 6.2  ALBUMIN 2.7* 2.8* 2.3* 2.2* 2.3*   No results found for this basename: LIPASE, AMYLASE,  in the last 168 hours No results found for this basename: AMMONIA,  in the last 168 hours Coagulation profile  Recent Labs Lab 02/23/13 0940  INR 0.93    CBC:  Recent Labs Lab 02/18/13 1820 02/19/13 0410 02/20/13 0600 02/22/13 0800 02/23/13 0940  WBC 9.9 9.1 9.3 8.7 8.7  NEUTROABS 7.4 5.8 6.7 5.6 6.3  HGB 10.0* 10.0* 9.1* 8.9* 8.9*  HCT 30.8* 31.4* 28.4* 27.1* 27.1*  MCV 88.3 89.5 90.2 89.4 88.9  PLT 366 404* 377 396 426*   Cardiac  Enzymes: No results found for this basename: CKTOTAL, CKMB, CKMBINDEX, TROPONINI,  in the last 168 hours BNP: No components found with this basename: POCBNP,  CBG:  Recent Labs Lab 02/24/13 0746 02/24/13 0852 02/24/13 1140 02/24/13 2006 02/24/13 2357  GLUCAP 68* 98 123* 163* 209*   D-Dimer No results found for this basename: DDIMER,  in the last 72 hours Hgb A1c No results found for this basename: HGBA1C,  in the last 72 hours Lipid Profile  Recent Labs  02/22/13 0800  TRIG 94   Thyroid function studies No results found for this basename: TSH, T4TOTAL, FREET3, T3FREE, THYROIDAB,  in the last 72 hours Anemia work up No results found for this basename: VITAMINB12, FOLATE, FERRITIN, TIBC, IRON, RETICCTPCT,  in the last 72 hours Microbiology No results found for this or any previous visit (from the past 240 hour(s)).    Studies:  Dg Abd 1 View  02/24/2013   *RADIOLOGY REPORT*  Clinical Data: Small bowel obstruction.   ABDOMEN - 1 VIEW  Comparison: 7/21, 7/19, 7/17, and 01/07/2013  Findings: Tip of the NG tube is in the fundus of the stomach and could be advanced.  The dilated small bowel in the mid abdomen and pelvis is slightly less prominent.  The colon is not distended.  There is evidence of ascites.  No acute osseous abnormality.  No visible free air on this supine radiograph.  IMPRESSION:  1.  Slight decreased distention of small bowel in the mid abdomen. 2.  NG tube tip is in the fundus of the stomach and could be advanced 10-15 cm.   Original Report Authenticated By: Francene Boyers, M.D.    Assessment: 72 y.o. 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), admitted with small bowel obstruction (1) status post left modified radical mastectomy October 1993 for a T2 N0 invasive ductal breast, estrogen and progesterone receptor positive cancer treated adjuvantly with MFL (methotrexate/fluorouracil/leucovorin) chemotherapy x6, completed April 1994, followed by 5 years of tamoxifen completed April 1999, with no evidence of recurrence  (2) now status post TAH BSO, omentectomy, and suboptimal debulking 10/29/2011 for a high-grade serous adenocarcinoma of the ovary, pT3c NX (Stage IIIC) , s/p eight cycles of carboplatin/paclitaxel completed 05/05/2012 (initial CA125 of 2050.7 normalized after cycle 5)  (3) recurrent disease documented by rising CEA 125 an abdominal CT scan 09/21/2012.  (4) COPD with ongoing tobacco abuse  (5) diabetes mellitus  (6) genetic testing for Lynch syndrome pending.  (7) status post hospitalization in July 2013 for stroke/small acute infarcts in the left sub insular white matter and coronal radiata.  (8) port in place  (9) left upper lobe 6 mm subpleural nodule, without associated hypermetabolic activity (likely well below the resolution of PET imaging) noted August 2013  (10) CTs obtained in [place] 2014 showed evidence of significant  progression of disease with widespread intraperitoneal metastases and a moderate volume of presumably malignant ascites.  (11) treated with Doxil on a Q. three-week basis, first dose given on 10/06/2012, discontinued after 5 cycles, last dose given on 12/29/2012, secondary to hand/foot syndrome and skin changes.  (12) cholecystitis, with cholecystectomy pending  (13) Hx of recurring ascites, requiring therapeutic paracentesis and Aspira drain, removed on 01/15/13  (14) Malnutrition, on TPN. Apparent infiltration of TPN noted on 12/15/12, resolved  (15) Hx of small bowel obstruction requiring hospitalization, 01/06/2013 - 01/08/2013  (16) carboplatin to be given every 21 days, first dose on 01/26/2013, second dose 02/16/2013  Plan: Lexani is encouraged by the possibility of getting a gastrosto.pmy placed surgically tomorrow. She understands the procedure may not be feasible and there may be complications. She tells me her husband and daughter [name] were present during the discussion with the surgeon -- I am repeating a KUB today, ordered labs in AM including a type and screen. Otherwise am hopeful we can help get rid of her ng tube. Anticipate d/c next week Bernadetta Roell C, MD 02/25/2013  7:11 AM

## 2013-02-25 NOTE — Progress Notes (Signed)
PARENTERAL NUTRITION CONSULT NOTE   Pharmacy Consult for TNA Indication: SBO/Malnutrition   Allergies  Allergen Reactions  . Promethazine     "Makes me feel like I want to jump out of my skin"    Patient Measurements: Height: 5\' 4"  (162.6 cm) Weight: 130 lb 12.8 oz (59.33 kg) IBW/kg (Calculated) : 54.7 Usual Weight:122 lb  Vital Signs: Temp: 98.4 F (36.9 C) (07/24 0654) Temp src: Oral (07/24 0654) BP: 123/65 mmHg (07/24 0654) Pulse Rate: 102 (07/24 0654) Intake/Output from previous day: 07/23 0701 - 07/24 0700 In: 0  Out: 1050 [Urine:150; Emesis/NG output:900] Intake/Output from this shift:    Labs:  Recent Labs  02/23/13 0940  WBC 8.7  HGB 8.9*  HCT 27.1*  PLT 426*  INR 0.93     Recent Labs  02/23/13 0940 02/24/13 0815 02/25/13 0850  NA 136 134* 136  K 4.0 3.8 4.1  CL 96 95* 97  CO2 31 33* 32  GLUCOSE 129* 178* 99  BUN 23 25* 23  CREATININE 0.51 0.51 0.51  CALCIUM 8.9 8.6 8.5  MG  --  1.6 1.7  PHOS  --   --  4.2  PROT 6.2  --  5.9*  ALBUMIN 2.3*  --  2.1*  AST 19  --  16  ALT 16  --  13  ALKPHOS 114  --  110  BILITOT 0.3  --  0.2*   Estimated Creatinine Clearance: 57.3 ml/min (by C-G formula based on Cr of 0.51).    Recent Labs  02/24/13 2006 02/24/13 2357 02/25/13 0748  GLUCAP 163* 209* 93    Medical History: Past Medical History  Diagnosis Date  . History of breast cancer   . COPD (chronic obstructive pulmonary disease)   . GERD (gastroesophageal reflux disease)   . Diabetes mellitus     type II  . Hx of colonic polyps   . Osteoarthritis   . MVP (mitral valve prolapse)   . Dysrhythmia     pt states runs a little fast   . Shortness of breath     with exertion   . Depression   . Anxiety   . Heart murmur   . Stroke   . Ascites 10/22/2012  . metastases dx'd 10/2011  . Ovarian cancer dx'd2/2013    active chemotherapy  . Breast CA 1993  . Partial small bowel obstruction 01/2013    Medications:  Scheduled:  .  ipratropium  0.5 mg Nebulization QID   And  . albuterol  2.5 mg Nebulization QID  . atenolol  12.5 mg Oral BID  . budesonide-formoterol  2 puff Inhalation BID  . buPROPion  150 mg Oral BID  . busPIRone  15 mg Oral BID  . enoxaparin (LOVENOX) injection  40 mg Subcutaneous Q24H  . ondansetron  4 mg Intravenous QHS  . pantoprazole (PROTONIX) IV  40 mg Intravenous Q24H  . sertraline  100 mg Oral QAC breakfast   Infusions:  . sodium chloride 20 mL/hr (02/25/13 0308)   PRN: albuterol, ALPRAZolam, diazepam, LORazepam, morphine, phenol, prochlorperazine, sodium chloride  Home Insulin Requirements :  Patient adds 30 units regular insulin to TNA each night.  The final volume of her home TNA, including overfill, is 1780 mL, for an insulin concentration of 16.9 units/L   Home TNA Regimen:   Home TNA per [facility] had been infused daily over 12 hours as follows: 85ml/hr x 1 hr, then 158 ml/hr x 10 hr, then 48ml/hr x1 hr,  then off x 10 hr.  This formula provided 85 grams protein/day, 1809 KCal/day.  Staff attempted to use TNA from home as ordered 7/17 but problems were reportedly encountered with the filter and the bag could not be used.    Please note that current [facility] policy requires hospital inpatients to have TNA supplied by the inpatient pharmacy rather than using home supply.  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 Surgery has seen patient and plans are for OR 7/25 for possible internal bypass vs. G- tube placement  Estimated Nutritional Needs per RD:  Kcal: 1610-9604  Protein: 80-90 grams  Fluid: 1.7 -2L daily  Current Nutrition: NPO Clinimix E 5/20 + insulin 20 units/L (40 units/2 L bag), 50 mL/hr x 1 hr, 131 mL/hr x 12 hr, 50 mL/hr x 1 hr, off x 10 hr Fat Emulsion 20%, 18 mL/hr x 14 hr, off x 10 hr Multivitamins and trace  elements daily. This regimen provides 84 grams protein/day, 1950 KCal/day  Maintenance [facility]F: NS at [facility]  Abd XR:  7/21 persistent SBO  Assessment: 72 yo F with metastatic ovarian cancer, on home TNA for SBO, admitted with uncontrolled nausea and vomiting - now has NG tube in place.  Needs continued TNA support.  Labs  CBGs: little high while running at peak rates, on 7/23am CBG while ramping TPN off dipped to 68 thus regular insulin decreased to 35units/bag, CBGs previously controlled with 40units/bag  Electrolytes - All WNL/stable (corr Ca++ at ULN)  LFTs - all below ULN  TGs - WNL  Prealbumin - 17.6 (7/19), 12.8 (7/21)  I/O:  NG output=900 ml yesterday - increased Wt:  Up from 56.2 kg on 7/17 to 59.3 kg on 7/23  Plan:  1. Continue present cyclic TNA regimen 2. Continue TNA labs. 3. CBGs worsened with decrease in insulin in TPN (change made d/t CBG = 68 while TPN coming off on 7/23am) - will increase back to 40 units/bag and watch CBGs  Juliette Alcide, PharmD, BCPS.   Pager: [phone] 02/25/2013  9:44 AM

## 2013-02-25 NOTE — Progress Notes (Signed)
Patient ID: [name] [name], female   DOB: 09-Jun-1941, 72 y.o.   MRN: [id number]    Subjective: Still with nausea, denies vomiting, passing some flatus, less pain today  Objective: Vital signs in last 24 hours: Temp:  [98.2 F (36.8 C)-99.4 F (37.4 C)] 98.4 F (36.9 C) (07/24 0654) Pulse Rate:  [93-102] 102 (07/24 0654) Resp:  [18-20] 20 (07/24 0654) BP: (99-123)/(63-70) 123/65 mmHg (07/24 0654) SpO2:  [95 %-100 %] 95 % (07/24 0654) Last BM Date: 02/16/13  Intake/Output from previous day: 07/23 0701 - 07/24 0700 In: 0  Out: 1050 [Urine:150; Emesis/NG output:900] Intake/Output this shift:    PE: Abd: mildly distended, +bs, mildly tender General: weak appearing, NGT and TNA in place Heart: RRR Lungs: CTA bil  Lab Results:   Recent Labs  02/23/13 0940  WBC 8.7  HGB 8.9*  HCT 27.1*  PLT 426*   BMET  Recent Labs  02/23/13 0940 02/24/13 0815  NA 136 134*  K 4.0 3.8  CL 96 95*  CO2 31 33*  GLUCOSE 129* 178*  BUN 23 25*  CREATININE 0.51 0.51  CALCIUM 8.9 8.6   PT/INR  Recent Labs  02/23/13 0940  LABPROT 12.3  INR 0.93   CMP     Component Value Date/Time   NA 134* 02/24/2013 0815   NA 136 02/16/2013 1020   K 3.8 02/24/2013 0815   K 4.4 02/16/2013 1020   CL 95* 02/24/2013 0815   CL 97* 01/26/2013 1334   CO2 33* 02/24/2013 0815   CO2 21* 02/16/2013 1020   GLUCOSE 178* 02/24/2013 0815   GLUCOSE 157* 02/16/2013 1020   GLUCOSE 97 01/26/2013 1334   GLUCOSE 162* 07/01/2006 1253   BUN 25* 02/24/2013 0815   BUN 27.3* 02/16/2013 1020   CREATININE 0.51 02/24/2013 0815   CREATININE 0.7 02/16/2013 1020   CALCIUM 8.6 02/24/2013 0815   CALCIUM 9.0 02/16/2013 1020   PROT 6.2 02/23/2013 0940   PROT 7.1 02/16/2013 1020   ALBUMIN 2.3* 02/23/2013 0940   ALBUMIN 2.7* 02/16/2013 1020   AST 19 02/23/2013 0940   AST 29 02/16/2013 1020   ALT 16 02/23/2013 0940   ALT 27 02/16/2013 1020   ALKPHOS 114 02/23/2013 0940   ALKPHOS 138 02/16/2013 1020   BILITOT 0.3 02/23/2013 0940   BILITOT  <0.20 Repeated and Verified 02/16/2013 1020   GFRNONAA >90 02/24/2013 0815   GFRAA >90 02/24/2013 0815   Lipase  No results found for this basename: lipase       Studies/Results: Dg Abd 1 View  02/24/2013   *RADIOLOGY REPORT*  Clinical Data: Small bowel obstruction.  ABDOMEN - 1 VIEW  Comparison: 7/21, 7/19, 7/17, and 01/07/2013  Findings: Tip of the NG tube is in the fundus of the stomach and could be advanced.  The dilated small bowel in the mid abdomen and pelvis is slightly less prominent.  The colon is not distended.  There is evidence of ascites.  No acute osseous abnormality.  No visible free air on this supine radiograph.  IMPRESSION:  1.  Slight decreased distention of small bowel in the mid abdomen. 2.  NG tube tip is in the fundus of the stomach and could be advanced 10-15 cm.   Original Report Authenticated By: Francene Boyers, M.D.    Anti-infectives: Anti-infectives   None       Assessment/Plan SBO/malnutrition/ovarian cancer: plan for exp lap tomorrow with ?internal bypass or gastrostomy tube placement   LOS: 7 days  WHITE, ELIZABETH 02/25/2013  I discussed the surgery tomorrow.  She is worried as she knows the ramifications

## 2013-02-26 ENCOUNTER — Encounter (HOSPITAL_COMMUNITY): Payer: Self-pay | Admitting: Anesthesiology

## 2013-02-26 ENCOUNTER — Encounter (HOSPITAL_COMMUNITY)
Admission: AD | Disposition: A | Discharge status: Home Health | Payer: Self-pay | Source: Ambulatory Visit | Attending: Oncology

## 2013-02-26 ENCOUNTER — Inpatient Hospital Stay (HOSPITAL_COMMUNITY): Payer: Medicare Other | Admitting: Anesthesiology

## 2013-02-26 HISTORY — PX: UPPER GI ENDOSCOPY: SHX6162

## 2013-02-26 HISTORY — PX: LAPAROTOMY: SHX154

## 2013-02-26 LAB — COMPREHENSIVE METABOLIC PANEL
BUN: 22 mg/dL (ref 6–23)
CO2: 31 mEq/L (ref 19–32)
Calcium: 8.3 mg/dL — ABNORMAL LOW (ref 8.4–10.5)
GFR calc Af Amer: >90 mL/min (ref >90)
GFR calc non Af Amer: >90 mL/min (ref >90)
Glucose, Bld: 229 mg/dL — ABNORMAL HIGH (ref 70–99)
Total Protein: 6 g/dL (ref 6.0–8.3)

## 2013-02-26 LAB — CBC
HCT: 26.7 % — ABNORMAL LOW (ref 36.0–46.0)
Hemoglobin: 8.6 g/dL — ABNORMAL LOW (ref 12.0–15.0)
MCH: 29 pg (ref 26.0–34.0)
MCHC: 32.2 g/dL (ref 30.0–36.0)
MCV: 89.9 fL (ref 78.0–100.0)

## 2013-02-26 LAB — SURGICAL PCR SCREEN: MRSA, PCR: NEGATIVE

## 2013-02-26 LAB — PROTIME-INR: Prothrombin Time: 12.9 seconds (ref 11.6–15.2)

## 2013-02-26 LAB — GLUCOSE, CAPILLARY
Glucose-Capillary: 115 mg/dL — ABNORMAL HIGH (ref 70–99)
Glucose-Capillary: 206 mg/dL — ABNORMAL HIGH (ref 70–99)
Glucose-Capillary: 314 mg/dL — ABNORMAL HIGH (ref 70–99)

## 2013-02-26 SURGERY — LAPAROTOMY, EXPLORATORY
Anesthesia: General | Site: Esophagus | Wound class: Clean Contaminated

## 2013-02-26 SURGERY — Surgical Case
Anesthesia: *Unknown

## 2013-02-26 MED ORDER — 0.9 % SODIUM CHLORIDE (POUR BTL) OPTIME
TOPICAL | Status: DC | PRN
  Administered 2013-02-26: 2000 mL

## 2013-02-26 MED ORDER — FENTANYL CITRATE 0.05 MG/ML IJ SOLN
INTRAMUSCULAR | Status: DC | PRN
  Administered 2013-02-26: 50 ug via INTRAVENOUS
  Administered 2013-02-26: 25 ug via INTRAVENOUS
  Administered 2013-02-26: 100 ug via INTRAVENOUS
  Administered 2013-02-26: 50 ug via INTRAVENOUS
  Administered 2013-02-26: 100 ug via INTRAVENOUS
  Administered 2013-02-26: 50 ug via INTRAVENOUS

## 2013-02-26 MED ORDER — ROCURONIUM BROMIDE 100 MG/10ML IV SOLN
INTRAVENOUS | Status: DC | PRN
  Administered 2013-02-26: 10 mg via INTRAVENOUS
  Administered 2013-02-26: 20 mg via INTRAVENOUS
  Administered 2013-02-26: 15 mg via INTRAVENOUS

## 2013-02-26 MED ORDER — INSULIN ASPART 100 UNIT/ML ~~LOC~~ SOLN
0.0000 [IU] | SUBCUTANEOUS | Status: DC
  Administered 2013-02-27 – 2013-02-28 (×2): 2 [IU] via SUBCUTANEOUS
  Administered 2013-02-28: 3 [IU] via SUBCUTANEOUS
  Administered 2013-03-01: 5 [IU] via SUBCUTANEOUS
  Administered 2013-03-01 – 2013-03-02 (×2): 3 [IU] via SUBCUTANEOUS
  Administered 2013-03-02: 2 [IU] via SUBCUTANEOUS

## 2013-02-26 MED ORDER — METOCLOPRAMIDE HCL 5 MG/ML IJ SOLN
INTRAMUSCULAR | Status: DC | PRN
  Administered 2013-02-26: 10 mg via INTRAVENOUS

## 2013-02-26 MED ORDER — BUPIVACAINE LIPOSOME 1.3 % IJ SUSP
20.0000 mL | Freq: Once | INTRAMUSCULAR | Status: AC
  Administered 2013-02-26: 20 mL
  Filled 2013-02-26: qty 20

## 2013-02-26 MED ORDER — PROPOFOL INFUSION 10 MG/ML OPTIME: INTRAVENOUS | Status: DC | PRN

## 2013-02-26 MED ORDER — MIDAZOLAM HCL 5 MG/5ML IJ SOLN
INTRAMUSCULAR | Status: DC | PRN
  Administered 2013-02-26: 1 mg via INTRAVENOUS

## 2013-02-26 MED ORDER — TRACE MINERALS CR-CU-F-FE-I-MN-MO-SE-ZN IV SOLN
INTRAVENOUS | Status: AC
  Administered 2013-02-26: 18:00:00 via INTRAVENOUS
  Filled 2013-02-26: qty 2000

## 2013-02-26 MED ORDER — LACTATED RINGERS IV SOLN
INTRAVENOUS | Status: DC | PRN
  Administered 2013-02-26 (×2): via INTRAVENOUS

## 2013-02-26 MED ORDER — SODIUM CHLORIDE 0.9 % IV SOLN
10.0000 mg | INTRAVENOUS | Status: DC | PRN
  Administered 2013-02-26: 10 ug/min via INTRAVENOUS

## 2013-02-26 MED ORDER — SODIUM CHLORIDE 0.9 % IV SOLN
INTRAVENOUS | Status: DC | PRN
  Administered 2013-02-26: 13:00:00 via INTRAVENOUS

## 2013-02-26 MED ORDER — GLYCOPYRROLATE 0.2 MG/ML IJ SOLN
INTRAMUSCULAR | Status: DC | PRN
  Administered 2013-02-26: 0.6 mg via INTRAVENOUS

## 2013-02-26 MED ORDER — FAT EMULSION 20 % IV EMUL
250.0000 mL | INTRAVENOUS | Status: AC
  Administered 2013-02-26: 250 mL via INTRAVENOUS
  Filled 2013-02-26: qty 250

## 2013-02-26 MED ORDER — LIDOCAINE HCL (CARDIAC) 20 MG/ML IV SOLN
INTRAVENOUS | Status: DC | PRN
  Administered 2013-02-26: 50 mg via INTRAVENOUS

## 2013-02-26 MED ORDER — METOPROLOL TARTRATE 1 MG/ML IV SOLN
2.5000 mg | Freq: Four times a day (QID) | INTRAVENOUS | Status: DC | PRN
  Administered 2013-02-26 – 2013-02-27 (×2): 2.5 mg via INTRAVENOUS
  Filled 2013-02-26 (×2): qty 5

## 2013-02-26 MED ORDER — PROPOFOL 10 MG/ML IV BOLUS
INTRAVENOUS | Status: DC | PRN
  Administered 2013-02-26: 140 mg via INTRAVENOUS

## 2013-02-26 MED ORDER — ONDANSETRON HCL 4 MG/2ML IJ SOLN
INTRAMUSCULAR | Status: DC | PRN
  Administered 2013-02-26 (×2): 4 mg via INTRAVENOUS

## 2013-02-26 MED ORDER — FENTANYL CITRATE 0.05 MG/ML IJ SOLN
25.0000 ug | INTRAMUSCULAR | Status: DC | PRN
  Administered 2013-02-26 (×3): 25 ug via INTRAVENOUS

## 2013-02-26 MED ORDER — ESMOLOL BOLUS VIA INFUSION
500.0000 ug/kg | Freq: Once | INTRAVENOUS | Status: AC
  Administered 2013-02-26: 10 mg via INTRAVENOUS

## 2013-02-26 MED ORDER — CEFOXITIN SODIUM 2 G IV SOLR
2.0000 g | Freq: Once | INTRAVENOUS | Status: AC
  Administered 2013-02-26: 2 g via INTRAVENOUS
  Filled 2013-02-26: qty 2

## 2013-02-26 MED ORDER — LEVALBUTEROL HCL 1.25 MG/0.5ML IN NEBU
1.2500 mg | INHALATION_SOLUTION | Freq: Once | RESPIRATORY_TRACT | Status: AC
  Administered 2013-02-26: 1.25 mg via RESPIRATORY_TRACT

## 2013-02-26 MED ORDER — SUCCINYLCHOLINE CHLORIDE 20 MG/ML IJ SOLN
INTRAMUSCULAR | Status: DC | PRN
  Administered 2013-02-26: 100 mg via INTRAVENOUS

## 2013-02-26 MED ORDER — NEOSTIGMINE METHYLSULFATE 1 MG/ML IJ SOLN
INTRAMUSCULAR | Status: DC | PRN
  Administered 2013-02-26: 4 mg via INTRAVENOUS

## 2013-02-26 SURGICAL SUPPLY — 42 items
APPLICATOR COTTON TIP 6IN STRL (MISCELLANEOUS) ×3 IMPLANT
BAG URINE DRAINAGE (UROLOGICAL SUPPLIES) ×3 IMPLANT
BLADE EXTENDED COATED 6.5IN (ELECTRODE) IMPLANT
BLADE HEX COATED 2.75 (ELECTRODE) ×3 IMPLANT
CANISTER SUCTION 2500CC (MISCELLANEOUS) ×3 IMPLANT
CLOTH BEACON ORANGE TIMEOUT ST (SAFETY) ×3 IMPLANT
COVER MAYO STAND STRL (DRAPES) ×3 IMPLANT
DRAPE LAPAROSCOPIC ABDOMINAL (DRAPES) ×3 IMPLANT
DRAPE WARM FLUID 44X44 (DRAPE) ×3 IMPLANT
ELECT REM PT RETURN 9FT ADLT (ELECTROSURGICAL) ×3
ELECTRODE REM PT RTRN 9FT ADLT (ELECTROSURGICAL) ×2 IMPLANT
GLOVE BIOGEL M 8.0 STRL (GLOVE) ×3 IMPLANT
GLOVE BIOGEL PI IND STRL 7.0 (GLOVE) ×2 IMPLANT
GLOVE BIOGEL PI INDICATOR 7.0 (GLOVE) ×1
GOWN STRL NON-REIN LRG LVL3 (GOWN DISPOSABLE) IMPLANT
GOWN STRL REIN XL XLG (GOWN DISPOSABLE) ×12 IMPLANT
KIT BASIN OR (CUSTOM PROCEDURE TRAY) ×3 IMPLANT
NS IRRIG 1000ML POUR BTL (IV SOLUTION) ×6 IMPLANT
PACK GENERAL/GYN (CUSTOM PROCEDURE TRAY) ×3 IMPLANT
PLUG CATH AND CAP STER (CATHETERS) ×3 IMPLANT
SPONGE GAUZE 4X4 12PLY (GAUZE/BANDAGES/DRESSINGS) IMPLANT
SPONGE LAP 18X18 X RAY DECT (DISPOSABLE) ×3 IMPLANT
STAPLER VISISTAT 35W (STAPLE) ×3 IMPLANT
SUCTION POOLE TIP (SUCTIONS) IMPLANT
SUT ETHILON 2 0 PS N (SUTURE) ×6 IMPLANT
SUT NOVA NAB DX-16 0-1 5-0 T12 (SUTURE) ×6 IMPLANT
SUT PDS AB 1 CTX 36 (SUTURE) IMPLANT
SUT SILK 2 0 (SUTURE) ×1
SUT SILK 2 0 SH (SUTURE) ×6 IMPLANT
SUT SILK 2 0 SH CR/8 (SUTURE) IMPLANT
SUT SILK 2-0 18XBRD TIE 12 (SUTURE) ×2 IMPLANT
SUT SILK 3 0 (SUTURE)
SUT SILK 3 0 SH CR/8 (SUTURE) IMPLANT
SUT SILK 3-0 18XBRD TIE 12 (SUTURE) IMPLANT
SUT VICRYL 2 0 18  UND BR (SUTURE)
SUT VICRYL 2 0 18 UND BR (SUTURE) IMPLANT
SYR 30ML LL (SYRINGE) ×3 IMPLANT
TAPE CLOTH SURG 4X10 WHT LF (GAUZE/BANDAGES/DRESSINGS) ×3 IMPLANT
TOWEL OR 17X26 10 PK STRL BLUE (TOWEL DISPOSABLE) ×3 IMPLANT
TRAY FOLEY CATH 14FRSI W/METER (CATHETERS) ×3 IMPLANT
TUBE MOSS GAS 18FR (TUBING) ×3 IMPLANT
YANKAUER SUCT BULB TIP NO VENT (SUCTIONS) IMPLANT

## 2013-02-26 NOTE — Transfer of Care (Signed)
Immediate Anesthesia Transfer of Care Note  Patient: [name] [name]  Procedure(s) Performed: Procedure(s): EXPLORATORY LAPAROTOMY WITH GASTROSTOMY TUBE PLACEMENT  (N/A) UPPER GI ENDOSCOPY  Patient Location: PACU  Anesthesia Type:General  Level of Consciousness: awake, sedated and unresponsive  Airway & Oxygen Therapy: Patient remains intubated per anesthesia plan and Patient placed on Ventilator (see vital sign flow sheet for setting)  Post-op Assessment: Report given to PACU RN and Post -op Vital signs reviewed and stable  Post vital signs: Reviewed and stable  Complications: No apparent anesthesia complications

## 2013-02-26 NOTE — Anesthesia Postprocedure Evaluation (Signed)
  Anesthesia Post-op Note  Patient: [name] [name]  Procedure(s) Performed: Procedure(s) (LRB): EXPLORATORY LAPAROTOMY WITH GASTROSTOMY TUBE PLACEMENT  (N/A) UPPER GI ENDOSCOPY  Patient Location: PACU  Anesthesia Type: General  Level of Consciousness: awake and alert   Airway and Oxygen Therapy: Patient Spontanous Breathing  Post-op Pain: mild  Post-op Assessment: Post-op Vital signs reviewed, Patient's Cardiovascular Status Stable, Respiratory Function Stable, Patent Airway and No signs of Nausea or vomiting  Last Vitals:  Filed Vitals:   02/26/13 1615  BP: 127/84  Pulse: 119  Temp: 36.9 C  Resp: 21    Post-op Vital Signs: stable   Complications: No apparent anesthesia complications. Similar tachycardia as she was preop. Required 30 minutes of ventilation in PACU before extubation. To floor per plan.

## 2013-02-26 NOTE — Progress Notes (Signed)
Patient extubated after 30 min . On ventilator post-op. Vital signs stable, Patient followings commands, able to verbalize clearly. O2 sat 97 on 10 lpm simple mask. Will continue to monitor

## 2013-02-26 NOTE — Progress Notes (Addendum)
NUTRITION FOLLOW UP  Intervention:   - TPN per pharmacy - Spiritual care consult for emotional anxiety of pt and family with pt desiring to only make it to 72th birthday per MD notes  - Will continue to monitor   Nutrition Dx:   Inadequate oral intake related to inability to eat as evidenced by NPO status - ongoing   Goal:   Pt to meet >/= 90% of their estimated nutrition needs - met  Monitor:   Weights, labs, TPN, NGT output  Assessment:   72 yo F with metastatic ovarian cancer, on home TNA for SBO, admitted with uncontrolled nausea and vomiting - now has NG tube in place. Needs continued TNA support. NG tube found to be in distal esophagus but was inserted 10cm further on 7/21. There were plans for G tube for relief from small bowel obstruction however radiology noted pt with significant peritoneal carcinomatosis obstructing clear access for placement. Surgery was consulted with plans for laparotomy with hopeful internal bypass or G tube placement today. Pt out of room for procedure. Pt with 1L NGT output total yesterday. Pt's weight up 5 pounds since admission.   Clinimix E 5/20 + insulin 20 units/L (40 units/2 L bag), 50 mL/hr x 1 hr, 131 mL/hr x 12 hr, 50 mL/hr x 1 hr, off x 10 hr  Fat Emulsion 20%, 18 mL/hr x 14 hr, off x 10 hr  This regimen provides 84 grams protein/day, 1950 KCal/day which meets 120% estimated calorie needs, 105% estimated protein needs.   Sodium slightly low, PALB low at 12.8 mg/dL and slightly down from earlier in the week (17.6 mg/dL on 1/61), CBGs significantly elevated this morning - pharmacist noted it was because of decrease in insulin in TPN and will increase back to 40 units/bag.   Height: Ht Readings from Last 1 Encounters:  02/18/13 5\' 4"  (1.626 m)    Weight Status:   Wt Readings from Last 1 Encounters:  02/26/13 129 lb (58.514 kg)    Re-estimated needs:  Kcal: 0960-4540 Protein: 80-90g Fluid: 1.7-2L/day  Skin: Intact   Diet Order:  NPO   Intake/Output Summary (Last 24 hours) at 02/26/13 1216 Last data filed at 02/26/13 1100  Gross per 24 hour  Intake 7546.26 ml  Output   1650 ml  Net 5896.26 ml    Last BM: 7/19   Labs:   Recent Labs Lab 02/22/13 0800  02/24/13 0815 02/25/13 0850 02/26/13 0805  NA 135  < > 134* 136 134*  K 3.8  < > 3.8 4.1 4.1  CL 98  < > 95* 97 96  CO2 31  < > 33* 32 31  BUN 21  < > 25* 23 22  CREATININE 0.48*  < > 0.51 0.51 0.49*  CALCIUM 8.9  < > 8.6 8.5 8.3*  MG 1.6  --  1.6 1.7  --   PHOS 4.6  --   --  4.2  --   GLUCOSE 181*  < > 178* 99 229*  < > = values in this interval not displayed.  CBG (last 3)   Recent Labs  02/25/13 1950 02/25/13 2341 02/26/13 0232  GLUCAP 149* 314* 206*    Scheduled Meds: . ipratropium  0.5 mg Nebulization QID   And  . albuterol  2.5 mg Nebulization QID  . atenolol  12.5 mg Oral BID  . budesonide-formoterol  2 puff Inhalation BID  . buPROPion  150 mg Oral BID  . busPIRone  15 mg Oral  BID  . ondansetron  4 mg Intravenous QHS  . pantoprazole (PROTONIX) IV  40 mg Intravenous Q24H  . sertraline  100 mg Oral QAC breakfast    Continuous Infusions: . sodium chloride 20 mL/hr (02/25/13 0308)    Levon Hedger MS, RD, LDN [phone] Pager [phone] After Hours Pager

## 2013-02-26 NOTE — Op Note (Signed)
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  Prepped with PCMX and draped sterilely.  Time out performed.  Upper midline incision made and carried down to visualize the liver when was stuck to the peritoneum anteriorally.  Tumor fixation in the lower abdomen at the point of the prior incision.  I dissected beneath the left lateral segment and found what I thought might be the stomach.  Under such circumstances the colon can be mistaken.  I performed upper endoscopy and saw the light through the structure that I thought was stomach and marked it for G tube placement.  Through a 2-0 silk pursestring I created a gastrotomy and then inserted a Moss tube through the anterior abdominal wall and into the stomach.  The 20 cc balloon was inflated with saline after being checked and was pulled back to help secure the gastrostomy to the anterior abdominal wall.  The purse string had been tied down. It was secured to the skin.    The incision was closed with #1 Novafils in a simple fashion.  Exparel was injected and the skin was closed with a stapler.    Matt B. Daphine Deutscher, MD, [facility], [place] [phone]

## 2013-02-26 NOTE — Anesthesia Preprocedure Evaluation (Addendum)
Anesthesia Evaluation  Patient identified by MRN, date of birth, ID band Patient awake    Reviewed: Allergy & Precautions, H&P , NPO status , Patient's Chart, lab work & pertinent test results  Airway Mallampati: II TM Distance: >3 FB Neck ROM: Full    Dental no notable dental hx.    Pulmonary shortness of breath, COPD COPD inhaler, Current Smoker,  breath sounds clear to auscultation  Pulmonary exam normal       Cardiovascular negative cardio ROS  + dysrhythmias + Valvular Problems/Murmurs MVP Rhythm:Regular Rate:Normal     Neuro/Psych PSYCHIATRIC DISORDERS Anxiety Depression CVA    GI/Hepatic Neg liver ROS, GERD-  Medicated,  Endo/Other  diabetes  Renal/GU negative Renal ROS  negative genitourinary   Musculoskeletal negative musculoskeletal ROS (+)   Abdominal   Peds negative pediatric ROS (+)  Hematology negative hematology ROS (+)   Anesthesia Other Findings   Reproductive/Obstetrics negative OB ROS                          Anesthesia Physical Anesthesia Plan  ASA: IV  Anesthesia Plan: General   Post-op Pain Management:    Induction: Intravenous  Airway Management Planned: Oral ETT  Additional Equipment:   Intra-op Plan:   Post-operative Plan: Extubation in OR and Post-operative intubation/ventilation  Informed Consent: I have reviewed the patients History and Physical, chart, labs and discussed the procedure including the risks, benefits and alternatives for the proposed anesthesia with the patient or authorized representative who has indicated his/her understanding and acceptance.   Dental advisory given  Plan Discussed with: CRNA  Anesthesia Plan Comments: (Discussed possible postoperative ventilation. Her COPD has worsened since last surgery 2013.)       Anesthesia Quick Evaluation

## 2013-02-26 NOTE — Progress Notes (Signed)
[name] [name]   DOB:[birth year]   ZO#:[id number]   JWJ#:[id number]  Subjective: [name] [name] had more pain yesterday and we upped her morphine slightly; she was comfortable overnight; yesterday was "the same" otherwise.No family in room.   Objective: middle aged white woman examined in bed Filed Vitals:   02/26/13 0634  BP: 126/59  Pulse: 115  Temp: 98.3 F (36.8 C)  Resp: 18    Body mass index is 22.13 kg/(m^2).  Intake/Output Summary (Last 24 hours) at 02/26/13 0750 Last data filed at 02/26/13 0700  Gross per 24 hour  Intake 7306.26 ml  Output   1450 ml  Net 5856.26 ml     Sclerae unicteric  Oropharynx clear; ng in place connected to suction  No cervical or supraclavicularl adenopathy  Lungs no rales or rhonchi--auscultated anterolaterally  Heart regular rate and rhythm  Abdomen mildly distended, diminished but present BD  MSK, no peripheral edema  Neuro nonfocal, well oriented, appropriate affect  Breast exam: deferred  CBG (last 3)   Recent Labs  02/25/13 1950 02/25/13 2341 02/26/13 0232  GLUCAP 149* 314* 206*     Labs:  Lab Results  Component Value Date   WBC 8.7 02/23/2013   HGB 8.9* 02/23/2013   HCT 27.1* 02/23/2013   MCV 88.9 02/23/2013   PLT 426* 02/23/2013   NEUTROABS 6.3 02/23/2013    @LASTCHEMISTRY @  Urine Studies No results found for this basename: UACOL, UAPR, USPG, UPH, UTP, UGL, UKET, UBIL, UHGB, UNIT, UROB, ULEU, UEPI, UWBC, URBC, UBAC, CAST, CRYS, UCOM, BILUA,  in the last 72 hours  Basic Metabolic Panel:  Recent Labs Lab 02/21/13 0838 02/22/13 0800 02/23/13 0940 02/24/13 0815 02/25/13 0850  NA 139 135 136 134* 136  K 3.0* 3.8 4.0 3.8 4.1  CL 105 98 96 95* 97  CO2 26 31 31  33* 32  GLUCOSE 116* 181* 129* 178* 99  BUN 21 21 23  25* 23  CREATININE 0.43* 0.48* 0.51 0.51 0.51  CALCIUM 7.2* 8.9 8.9 8.6 8.5  MG  --  1.6  --  1.6 1.7  PHOS  --  4.6  --   --  4.2   GFR Estimated Creatinine Clearance: 57.3 ml/min (by C-G formula based on Cr  of 0.51). Liver Function Tests:  Recent Labs Lab 02/20/13 0600 02/22/13 0800 02/23/13 0940 02/25/13 0850  AST 38* 16 19 16   ALT 31 15 16 13   ALKPHOS 116 107 114 110  BILITOT 0.2* 0.2* 0.3 0.2*  PROT 6.1 6.0 6.2 5.9*  ALBUMIN 2.3* 2.2* 2.3* 2.1*   No results found for this basename: LIPASE, AMYLASE,  in the last 168 hours No results found for this basename: AMMONIA,  in the last 168 hours Coagulation profile  Recent Labs Lab 02/23/13 0940  INR 0.93    CBC:  Recent Labs Lab 02/20/13 0600 02/22/13 0800 02/23/13 0940  WBC 9.3 8.7 8.7  NEUTROABS 6.7 5.6 6.3  HGB 9.1* 8.9* 8.9*  HCT 28.4* 27.1* 27.1*  MCV 90.2 89.4 88.9  PLT 377 396 426*   Cardiac Enzymes: No results found for this basename: CKTOTAL, CKMB, CKMBINDEX, TROPONINI,  in the last 168 hours BNP: No components found with this basename: POCBNP,  CBG:  Recent Labs Lab 02/25/13 0748 02/25/13 1132 02/25/13 1950 02/25/13 2341 02/26/13 0232  GLUCAP 93 133* 149* 314* 206*   D-Dimer No results found for this basename: DDIMER,  in the last 72 hours Hgb A1c No results found for this basename: HGBA1C,  in the last  72 hours Lipid Profile No results found for this basename: CHOL, HDL, LDLCALC, TRIG, CHOLHDL, LDLDIRECT,  in the last 72 hours Thyroid function studies No results found for this basename: TSH, T4TOTAL, FREET3, T3FREE, THYROIDAB,  in the last 72 hours Anemia work up No results found for this basename: VITAMINB12, FOLATE, FERRITIN, TIBC, IRON, RETICCTPCT,  in the last 72 hours Microbiology No results found for this or any previous visit (from the past 240 hour(s)).    Studies:  Dg Abd 1 View  02/25/2013   *RADIOLOGY REPORT*  Clinical Data: History of small-bowel obstruction.  Distention of abdomen.  Follow-up.  ABDOMEN - 1 VIEW  Comparison: 02/24/2013.  Findings: Tip of enteric tube is in the proximal area of the stomach and could be advanced.  There is continued dilatation of loops of small  intestine in the abdomen.  The largest loop has a greatest caliber of 5 cm which is slightly larger than on previous study.  There is some colonic gas present.  No opaque calculi are seen.  Pelvic phlebolith is present.  Surgical clip is seen in the pelvis.  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  IMPRESSION: Tip of enteric tube is in the proximal area of the stomach and could be advanced.  There is continued dilatation of loops of small intestine in the abdomen.  The largest loop has a greatest caliber of 5 cm which is slightly larger than on previous study.   Original Report Authenticated By: Onalee Hua Call   Dg Abd 1 View  02/24/2013   *RADIOLOGY REPORT*  Clinical Data: Small bowel obstruction.  ABDOMEN - 1 VIEW  Comparison: 7/21, 7/19, 7/17, and 01/07/2013  Findings: Tip of the NG tube is in the fundus of the stomach and could be advanced.  The dilated small bowel in the mid abdomen and pelvis is slightly less prominent.  The colon is not distended.  There is evidence of ascites.  No acute osseous abnormality.  No visible free air on this supine radiograph.  IMPRESSION:  1.  Slight decreased distention of small bowel in the mid abdomen. 2.  NG tube tip is in the fundus of the stomach and could be advanced 10-15 cm.   Original Report Authenticated By: Francene Boyers, M.D.    Assessment: 72 y.o. 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), admitted with small bowel obstruction (1) status post left modified radical mastectomy October 1993 for a T2 N0 invasive ductal breast, estrogen and progesterone receptor positive cancer treated adjuvantly with MFL (methotrexate/fluorouracil/leucovorin) chemotherapy x6, completed April 1994, followed by 5 years of tamoxifen completed April 1999, with no evidence of recurrence  (2) now status post TAH BSO, omentectomy, and suboptimal debulking 10/29/2011 for a high-grade serous adenocarcinoma of the ovary, pT3c  NX (Stage IIIC) , s/p eight cycles of carboplatin/paclitaxel completed 05/05/2012 (initial CA125 of 2050.7 normalized after cycle 5)  (3) recurrent disease documented by rising CEA 125 an abdominal CT scan 09/21/2012.  (4) COPD with ongoing tobacco abuse  (5) diabetes mellitus  (6) genetic testing for Lynch syndrome pending.  (7) status post hospitalization in July 2013 for stroke/small acute infarcts in the left sub insular white matter and coronal radiata.  (8) port in place  (9) left upper lobe 6 mm subpleural nodule, without associated hypermetabolic activity (likely well below the resolution of PET imaging) noted August 2013  (10) CTs obtained in [place] 2014 showed evidence of significant progression of disease with  widespread intraperitoneal metastases and a moderate volume of presumably malignant ascites.  (11) treated with Doxil on a Q. three-week basis, first dose given on 10/06/2012, discontinued after 5 cycles, last dose given on 12/29/2012, secondary to hand/foot syndrome and skin changes.  (12) cholecystitis, with cholecystectomy pending  (13) Hx of recurring ascites, requiring therapeutic paracentesis and Aspira drain, removed on 01/15/13  (14) Malnutrition, on TPN. Apparent infiltration of TPN noted on 12/15/12, resolved  (15) Hx of small bowel obstruction requiring hospitalization, 01/06/2013 - 01/08/2013  (16) carboplatin to be given every 21 days, first dose on 01/26/2013, second dose 02/16/2013  Plan: Isabeau is currently day 11 cycle 2 single-agent carboplatin; counts are holding up well and her CA-125 is dropping. She is hopeful today's surgery may provide some relief from the SBO. We discussed the fact that Dr Daphine Deutscher may not be able to do much at all--open her abdomen, take a look, and close--or he may be able to place a Gastric tube so she can drain her stomach without need of an ng; or find some bowel that can bypass the block. We won't know until he gets in there.   Family is  very anxious--they called our office yesterday; I have reassured them that Dr Daphine Deutscher is very experienced and that regardless of what can be accomplished we expect her to recover well from the surgery.  I am on call this weekend. Will repeat labs in AM      .Lowella Dell, MD 02/26/2013  7:50 AM

## 2013-02-26 NOTE — Progress Notes (Signed)
PARENTERAL NUTRITION CONSULT NOTE   Pharmacy Consult for TNA Indication: SBO/Malnutrition   Allergies  Allergen Reactions  . Promethazine     "Makes me feel like I want to jump out of my skin"    Patient Measurements: Height: 5\' 4"  (162.6 cm) Weight: 129 lb (58.514 kg) IBW/kg (Calculated) : 54.7 Usual Weight:122 lb  Vital Signs: Temp: 98.3 F (36.8 C) (07/25 0634) Temp src: Oral (07/25 0634) BP: 126/59 mmHg (07/25 0634) Pulse Rate: 115 (07/25 0634) Intake/Output from previous day: 07/24 0701 - 07/25 0700 In: 7306.3 [P.O.:50; I.V.:883; WUJ:8119.1] Out: 1450 [Urine:450; Emesis/NG output:1000]  Labs:  Recent Labs  02/26/13 0805  WBC 7.0  HGB 8.6*  HCT 26.7*  PLT 452*  INR 0.99     Recent Labs  02/24/13 0815 02/25/13 0850 02/26/13 0805  NA 134* 136 134*  K 3.8 4.1 4.1  CL 95* 97 96  CO2 33* 32 31  GLUCOSE 178* 99 229*  BUN 25* 23 22  CREATININE 0.51 0.51 0.49*  CALCIUM 8.6 8.5 8.3*  MG 1.6 1.7  --   PHOS  --  4.2  --   PROT  --  5.9* 6.0  ALBUMIN  --  2.1* 2.1*  AST  --  16 13  ALT  --  13 12  ALKPHOS  --  110 112  BILITOT  --  0.2* 0.2*   Estimated Creatinine Clearance: 57.3 ml/min (by C-G formula based on Cr of 0.49).    Recent Labs  02/25/13 1950 02/25/13 2341 02/26/13 0232  GLUCAP 149* 314* 206*     Medications:  Scheduled:  . ipratropium  0.5 mg Nebulization QID   And  . albuterol  2.5 mg Nebulization QID  . atenolol  12.5 mg Oral BID  . budesonide-formoterol  2 puff Inhalation BID  . buPROPion  150 mg Oral BID  . busPIRone  15 mg Oral BID  . ondansetron  4 mg Intravenous QHS  . pantoprazole (PROTONIX) IV  40 mg Intravenous Q24H  . sertraline  100 mg Oral QAC breakfast   Infusions:  . sodium chloride 20 mL/hr (02/25/13 0308)   Home Insulin Requirements :   Patient adds 30 units regular insulin to TNA each night.  The final volume of her home TNA, including overfill, is 1780 mL, for an insulin concentration of 16.9  units/L Home TNA Regimen:   Home TNA per [facility] had been infused daily over 12 hours as follows: 73ml/hr x 1 hr, then 158 ml/hr x 10 hr, then 23ml/hr x1 hr, then off x 10 hr.  This formula provided 85 grams protein/day, 1809 KCal/day.  Staff attempted to use TNA from home as ordered 7/17 but problems were reportedly encountered with the filter and the bag could not be used.    Please note that current [facility] policy requires hospital inpatients to have TNA supplied by the inpatient pharmacy rather than using home supply.  Exploring options of G-tube for recurrent SBO and possibility to avoid multiple admissions caused by this.  Estimated Nutritional Needs per RD (7/18): Kcal: 1630-1860, Protein: 80-90 grams, Fluid: 1.7 -2L daily  Current Nutrition: NPO Clinimix E 5/20 + insulin 20 units/L (40 units/2 L bag) cycled over 14 hours.  50 mL/hr x 1 hr, 131 mL/hr x 12 hr, 50 mL/hr x 1 hr, off x 10 hr Fat Emulsion 20%, 18 mL/hr x 14 hr, off x 10 hr Multivitamins and trace elements daily. This regimen provides 84 grams protein/day, 1950  KCal/day  Maintenance [facility]F: [facility] at [facility]  Assessment: 72 yo F with metastatic ovarian cancer, on home TNA for SBO, admitted with uncontrolled nausea and vomiting - now has NG tube in place.  Needs continued TNA support.  Surgical evaluation planned for 7/25 to assess for possible Gtube placement or internal bypass.  Labs  CBGs: high while running at peak rates (max 314 at 2341) CBG from AM, when TPN off, is not recorded.  CBG currently at 140.    Electrolytes - WNL/stable except Na slightly low (unable to adjust in TPN)  LFTs - WNL/stable  TGs - WNL  Prealbumin - 17.6 (7/19), 12.8 (7/21)  I/O:  NG output=1000 ml yesterday - increased Wt:  Up from 56.2 kg on 7/17 to 59.3 kg on 7/23; 58.5kg on 7/25  Plan:   Continue present cyclic TNA regimen  Continue TNA labs.  CBGs worsened (elevated only during infusion) - Add moderate SSI Novolog  during infusion at custom times during infusion.  F/U CBGs closely.  Lynann Beaver PharmD, BCPS Pager [phone] 02/26/2013 1:35 PM

## 2013-02-27 LAB — CBC WITH DIFFERENTIAL/PLATELET
Basophils Absolute: 0 10*3/uL (ref 0.0–0.1)
Basophils Relative: 0 % (ref 0–1)
Eosinophils Absolute: 0 10*3/uL (ref 0.0–0.7)
Hemoglobin: 7.9 g/dL — ABNORMAL LOW (ref 12.0–15.0)
MCH: 28.9 pg (ref 26.0–34.0)
MCHC: 32 g/dL (ref 30.0–36.0)
Monocytes Relative: 18 % — ABNORMAL HIGH (ref 3–12)
Neutrophils Relative %: 59 % (ref 43–77)
RDW: 17.1 % — ABNORMAL HIGH (ref 11.5–15.5)

## 2013-02-27 LAB — COMPREHENSIVE METABOLIC PANEL
Albumin: 1.8 g/dL — ABNORMAL LOW (ref 3.5–5.2)
Alkaline Phosphatase: 110 U/L (ref 39–117)
BUN: 20 mg/dL (ref 6–23)
Creatinine, Ser: 0.56 mg/dL (ref 0.50–1.10)
Potassium: 3.8 mEq/L (ref 3.5–5.1)
Total Protein: 5.6 g/dL — ABNORMAL LOW (ref 6.0–8.3)

## 2013-02-27 LAB — GLUCOSE, CAPILLARY: Glucose-Capillary: 115 mg/dL — ABNORMAL HIGH (ref 70–99)

## 2013-02-27 MED ORDER — PANTOPRAZOLE SODIUM 40 MG PO TBEC
40.0000 mg | DELAYED_RELEASE_TABLET | Freq: Every day | ORAL | Status: DC
  Administered 2013-02-27 – 2013-02-28 (×2): 40 mg via ORAL
  Filled 2013-02-27 (×3): qty 1

## 2013-02-27 MED ORDER — TRACE MINERALS CR-CU-F-FE-I-MN-MO-SE-ZN IV SOLN
INTRAVENOUS | Status: AC
  Administered 2013-02-27 – 2013-02-28 (×3): via INTRAVENOUS
  Filled 2013-02-27: qty 2000

## 2013-02-27 MED ORDER — SODIUM CHLORIDE 0.9 % IJ SOLN: 3.0000 mL | INTRAMUSCULAR | Status: DC | PRN

## 2013-02-27 MED ORDER — MORPHINE SULFATE (CONCENTRATE) 10 MG /0.5 ML PO SOLN
10.0000 mg | ORAL | Status: DC | PRN
  Administered 2013-02-27 – 2013-03-01 (×7): 10 mg via ORAL
  Filled 2013-02-27 (×7): qty 0.5

## 2013-02-27 MED ORDER — HEPARIN SOD (PORK) LOCK FLUSH 100 UNIT/ML IV SOLN
500.0000 [IU] | Freq: Every day | INTRAVENOUS | Status: DC | PRN
  Filled 2013-02-27: qty 5

## 2013-02-27 MED ORDER — PROCHLORPERAZINE MALEATE 10 MG PO TABS
10.0000 mg | ORAL_TABLET | Freq: Four times a day (QID) | ORAL | Status: DC | PRN
  Administered 2013-02-28: 10 mg via ORAL
  Filled 2013-02-27: qty 1

## 2013-02-27 MED ORDER — LORAZEPAM 0.5 MG PO TABS
0.5000 mg | ORAL_TABLET | Freq: Four times a day (QID) | ORAL | Status: DC | PRN
  Administered 2013-02-27 – 2013-02-28 (×2): 0.5 mg via ORAL
  Filled 2013-02-27 (×2): qty 1

## 2013-02-27 MED ORDER — SODIUM CHLORIDE 0.9 % IV SOLN
250.0000 mL | Freq: Once | INTRAVENOUS | Status: AC
  Administered 2013-02-27: 250 mL via INTRAVENOUS

## 2013-02-27 MED ORDER — ALBUTEROL SULFATE (5 MG/ML) 0.5% IN NEBU
2.5000 mg | INHALATION_SOLUTION | RESPIRATORY_TRACT | Status: DC
  Administered 2013-02-28 (×4): 2.5 mg via RESPIRATORY_TRACT
  Filled 2013-02-27 (×4): qty 0.5

## 2013-02-27 MED ORDER — SODIUM CHLORIDE 0.9 % IJ SOLN: 10.0000 mL | INTRAMUSCULAR | Status: DC | PRN

## 2013-02-27 MED ORDER — IPRATROPIUM BROMIDE 0.02 % IN SOLN
0.5000 mg | Freq: Four times a day (QID) | RESPIRATORY_TRACT | Status: DC
  Administered 2013-02-28 (×4): 0.5 mg via RESPIRATORY_TRACT
  Filled 2013-02-27 (×4): qty 2.5

## 2013-02-27 MED ORDER — ONDANSETRON HCL 4 MG PO TABS
4.0000 mg | ORAL_TABLET | Freq: Three times a day (TID) | ORAL | Status: DC | PRN
  Administered 2013-02-27 – 2013-02-28 (×2): 4 mg via ORAL
  Filled 2013-02-27 (×2): qty 1

## 2013-02-27 MED ORDER — FAT EMULSION 20 % IV EMUL
250.0000 mL | INTRAVENOUS | Status: AC
  Administered 2013-02-27: 250 mL via INTRAVENOUS
  Filled 2013-02-27: qty 250

## 2013-02-27 MED ORDER — HEPARIN SOD (PORK) LOCK FLUSH 100 UNIT/ML IV SOLN
250.0000 [IU] | INTRAVENOUS | Status: DC | PRN
  Filled 2013-02-27: qty 3

## 2013-02-27 NOTE — Progress Notes (Signed)
[name] [name]   DOB:[birth year]   WU#:[id number]   GNF#:[id number]  Subjective: [name] [name] .stayed in bed most of today; pain is "OK except when I cough." No nausea or retching. No green or yellow phlegm. Throat much better w ng removed. Daughter in room  Objective: middle aged white woman examined in bed Filed Vitals:   02/27/13 0600  BP: 107/65  Pulse: 118  Temp: 97.8 F (36.6 C)  Resp: 18    Body mass index is 22.13 kg/(m^2).  Intake/Output Summary (Last 24 hours) at 02/27/13 1648 Last data filed at 02/27/13 1202  Gross per 24 hour  Intake 3890.15 ml  Output   1200 ml  Net 2690.15 ml     Sclerae unicteric  Oropharynx clear; ng out!  No cervical or supraclavicularl adenopathy  Lungs no rales or rhonchi--auscultated anterolaterally  Heart regular rate and rhythm  Abdomen shows G tube in place connected to gravity drainage; bandage is clean and dry  MSK, no peripheral edema  Neuro nonfocal, well oriented, appropriate affect  Breast exam: deferred  CBG (last 3)   Recent Labs  02/26/13 2225 02/27/13 0538 02/27/13 1410  GLUCAP 115* 105* 115*     Labs:  Lab Results  Component Value Date   WBC 7.5 02/27/2013   HGB 7.9* 02/27/2013   HCT 24.7* 02/27/2013   MCV 90.5 02/27/2013   PLT 398 02/27/2013   NEUTROABS 4.4 02/27/2013    @LASTCHEMISTRY @  Urine Studies No results found for this basename: UACOL, UAPR, USPG, UPH, UTP, UGL, UKET, UBIL, UHGB, UNIT, UROB, ULEU, UEPI, UWBC, URBC, UBAC, CAST, CRYS, UCOM, BILUA,  in the last 72 hours  Basic Metabolic Panel:  Recent Labs Lab 02/22/13 0800 02/23/13 0940 02/24/13 0815 02/25/13 0850 02/26/13 0805 02/27/13 0843  NA 135 136 134* 136 134* 134*  K 3.8 4.0 3.8 4.1 4.1 3.8  CL 98 96 95* 97 96 95*  CO2 31 31 33* 32 31 33*  GLUCOSE 181* 129* 178* 99 229* 83  BUN 21 23 25* 23 22 20   CREATININE 0.48* 0.51 0.51 0.51 0.49* 0.56  CALCIUM 8.9 8.9 8.6 8.5 8.3* 8.5  MG 1.6  --  1.6 1.7  --   --   PHOS 4.6  --   --  4.2  --   --     GFR Estimated Creatinine Clearance: 57.3 ml/min (by C-G formula based on Cr of 0.56). Liver Function Tests:  Recent Labs Lab 02/22/13 0800 02/23/13 0940 02/25/13 0850 02/26/13 0805 02/27/13 0843  AST 16 19 16 13 29   ALT 15 16 13 12 21   ALKPHOS 107 114 110 112 110  BILITOT 0.2* 0.3 0.2* 0.2* 0.2*  PROT 6.0 6.2 5.9* 6.0 5.6*  ALBUMIN 2.2* 2.3* 2.1* 2.1* 1.8*   No results found for this basename: LIPASE, AMYLASE,  in the last 168 hours No results found for this basename: AMMONIA,  in the last 168 hours Coagulation profile  Recent Labs Lab 02/23/13 0940 02/26/13 0805  INR 0.93 0.99    CBC:  Recent Labs Lab 02/22/13 0800 02/23/13 0940 02/26/13 0805 02/27/13 0843  WBC 8.7 8.7 7.0 7.5  NEUTROABS 5.6 6.3  --  4.4  HGB 8.9* 8.9* 8.6* 7.9*  HCT 27.1* 27.1* 26.7* 24.7*  MCV 89.4 88.9 89.9 90.5  PLT 396 426* 452* 398   Cardiac Enzymes: No results found for this basename: CKTOTAL, CKMB, CKMBINDEX, TROPONINI,  in the last 168 hours BNP: No components found with this basename: POCBNP,  CBG:  Recent Labs Lab 02/26/13 0232 02/26/13 1215 02/26/13 2225 02/27/13 0538 02/27/13 1410  GLUCAP 206* 140* 115* 105* 115*   D-Dimer No results found for this basename: DDIMER,  in the last 72 hours Hgb A1c No results found for this basename: HGBA1C,  in the last 72 hours Lipid Profile No results found for this basename: CHOL, HDL, LDLCALC, TRIG, CHOLHDL, LDLDIRECT,  in the last 72 hours Thyroid function studies No results found for this basename: TSH, T4TOTAL, FREET3, T3FREE, THYROIDAB,  in the last 72 hours Anemia work up No results found for this basename: VITAMINB12, FOLATE, FERRITIN, TIBC, IRON, RETICCTPCT,  in the last 72 hours Microbiology Recent Results (from the past 240 hour(s))  SURGICAL PCR SCREEN     Status: None   Collection Time    02/26/13 11:59 AM      Result Value Range Status   MRSA, PCR NEGATIVE  NEGATIVE Final   Staphylococcus aureus NEGATIVE   NEGATIVE Final   Comment:            The Xpert SA Assay (FDA     approved for NASAL specimens     in patients over 39 years of age),     is one component of     a comprehensive surveillance     program.  Test performance has     been validated by [employer] for patients greater     than or equal to 53 year old.     It is not intended     to diagnose infection nor to     guide or monitor treatment.      Studies:  No results found.  Assessment: 72 y.o. 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), admitted with small bowel obstruction (1) status post left modified radical mastectomy October 1993 for a T2 N0 invasive ductal breast, estrogen and progesterone receptor positive cancer treated adjuvantly with MFL (methotrexate/fluorouracil/leucovorin) chemotherapy x6, completed April 1994, followed by 5 years of tamoxifen completed April 1999, with no evidence of recurrence  (2) now status post TAH BSO, omentectomy, and suboptimal debulking 10/29/2011 for a high-grade serous adenocarcinoma of the ovary, pT3c NX (Stage IIIC) , s/p eight cycles of carboplatin/paclitaxel completed 05/05/2012 (initial CA125 of 2050.7 normalized after cycle 5)  (3) recurrent disease documented by rising CEA 125 an abdominal CT scan 09/21/2012.  (4) COPD with ongoing tobacco abuse  (5) diabetes mellitus  (6) genetic testing for Lynch syndrome pending.  (7) status post hospitalization in July 2013 for stroke/small acute infarcts in the left sub insular white matter and coronal radiata.  (8) port in place  (9) left upper lobe 6 mm subpleural nodule, without associated hypermetabolic activity (likely well below the resolution of PET imaging) noted August 2013  (10) CTs obtained in [place] 2014 showed evidence of significant progression of disease with widespread intraperitoneal metastases and a moderate volume of presumably malignant ascites.  (11) treated  with Doxil on a Q. three-week basis, first dose given on 10/06/2012, discontinued after 5 cycles, last dose given on 12/29/2012, secondary to hand/foot syndrome and skin changes.  (12) cholecystitis, with cholecystectomy pending  (13) Hx of recurring ascites, requiring therapeutic paracentesis and Aspira drain, removed on 01/15/13  (14) Malnutrition, on TPN. Apparent infiltration of TPN noted on 12/15/12, resolved  (15) Hx of small bowel obstruction requiring hospitalization, 01/06/2013 - 01/08/2013  (16) carboplatin to be given every 21 days, first dose  on 01/26/2013, second dose 02/16/2013 (17) persistent SBO, s/o gastric tube placement 02/26/2013  Plan: [name] [name] is currently day 12 cycle 2 single-agent carboplatin. She tolerated the G-tube placement yesterday w/o unusual complications (thank you Dr Daphine Deutscher!). I am going to start switching the patient's medications to a regimen she can continue at home, with a view to possible discharge Monday. Family will need to become familiar with G-tube care. She already has HHN in place.  She will need a transfusion tomorrow. Labs ordered.       Marland KitchenLowella Dell, MD 02/27/2013  4:48 PM

## 2013-02-27 NOTE — Progress Notes (Signed)
1 Day Post-Op  Subjective: Sleepy this morning.  No nausea  Objective: Vital signs in last 24 hours: Temp:  [97.8 F (36.6 C)-99.3 F (37.4 C)] 97.8 F (36.6 C) (07/26 0600) Pulse Rate:  [115-134] 118 (07/26 0600) Resp:  [12-24] 18 (07/26 0600) BP: (106-173)/(34-84) 107/65 mmHg (07/26 0600) SpO2:  [92 %-100 %] 95 % (07/26 0600) FiO2 (%):  [50 %] 50 % (07/25 1524) Last BM Date: 02/16/13  Intake/Output from previous day: 07/25 0701 - 07/26 0700 In: 6290.2 [I.V.:2683; ZOX:0960.4] Out: 1905 [Urine:1255; Emesis/NG output:400; Drains:250] Intake/Output this shift:    General appearance: cooperative and no distress GI: soft, appropriate tenderness, mild distension, G tube looks okay, clear output so far  Lab Results:   Recent Labs  02/26/13 0805  WBC 7.0  HGB 8.6*  HCT 26.7*  PLT 452*   BMET  Recent Labs  02/25/13 0850 02/26/13 0805  NA 136 134*  K 4.1 4.1  CL 97 96  CO2 32 31  GLUCOSE 99 229*  BUN 23 22  CREATININE 0.51 0.49*  CALCIUM 8.5 8.3*   PT/INR  Recent Labs  02/26/13 0805  LABPROT 12.9  INR 0.99   ABG No results found for this basename: PHART, PCO2, PO2, HCO3,  in the last 72 hours  Studies/Results: Dg Abd 1 View  02/25/2013   *RADIOLOGY REPORT*  Clinical Data: History of small-bowel obstruction.  Distention of abdomen.  Follow-up.  ABDOMEN - 1 VIEW  Comparison: 02/24/2013.  Findings: Tip of enteric tube is in the proximal area of the stomach and could be advanced.  There is continued dilatation of loops of small intestine in the abdomen.  The largest loop has a greatest caliber of 5 cm which is slightly larger than on previous study.  There is some colonic gas present.  No opaque calculi are seen.  Pelvic phlebolith is present.  Surgical clip is seen in the pelvis.  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  IMPRESSION: Tip of enteric tube is in the proximal area of the stomach and could be advanced.  There is continued dilatation of loops of  small intestine in the abdomen.  The largest loop has a greatest caliber of 5 cm which is slightly larger than on previous study.   Original Report Authenticated By: Onalee Hua Call    Anti-infectives: Anti-infectives   Start     Dose/Rate Route Frequency Ordered Stop   02/26/13 1230  cefOXitin (MEFOXIN) 2 g in dextrose 5 % 50 mL IVPB     2 g 100 mL/hr over 30 Minutes Intravenous  Once 02/26/13 1223 02/26/13 1329      Assessment/Plan: s/p Procedure(s): EXPLORATORY LAPAROTOMY WITH GASTROSTOMY TUBE PLACEMENT  (N/A) UPPER GI ENDOSCOPY G tube to gravity for comfort.  no evidence of postop complication so far.  LOS: 9 days    Lodema Pilot DAVID 02/27/2013

## 2013-02-27 NOTE — Progress Notes (Signed)
PARENTERAL NUTRITION CONSULT NOTE   Pharmacy Consult for TNA Indication: SBO/Malnutrition   Allergies  Allergen Reactions  . Promethazine     "Makes me feel like I want to jump out of my skin"    Patient Measurements: Height: 5\' 4"  (162.6 cm) Weight: 129 lb (58.514 kg) IBW/kg (Calculated) : 54.7 Usual Weight:122 lb  Vital Signs: Temp: 97.8 F (36.6 C) (07/26 0600) Temp src: Oral (07/26 0600) BP: 107/65 mmHg (07/26 0600) Pulse Rate: 118 (07/26 0600) Intake/Output from previous day: 07/25 0701 - 07/26 0700 In: 6290.2 [I.V.:2683; ZOX:0960.4] Out: 1905 [Urine:1255; Emesis/NG output:400; Drains:250]  Labs:  Recent Labs  02/26/13 0805  WBC 7.0  HGB 8.6*  HCT 26.7*  PLT 452*  INR 0.99     Recent Labs  02/24/13 0815 02/25/13 0850 02/26/13 0805  NA 134* 136 134*  K 3.8 4.1 4.1  CL 95* 97 96  CO2 33* 32 31  GLUCOSE 178* 99 229*  BUN 25* 23 22  CREATININE 0.51 0.51 0.49*  CALCIUM 8.6 8.5 8.3*  MG 1.6 1.7  --   PHOS  --  4.2  --   PROT  --  5.9* 6.0  ALBUMIN  --  2.1* 2.1*  AST  --  16 13  ALT  --  13 12  ALKPHOS  --  110 112  BILITOT  --  0.2* 0.2*   Estimated Creatinine Clearance: 57.3 ml/min (by C-G formula based on Cr of 0.49).    Recent Labs  02/26/13 1215 02/26/13 2225 02/27/13 0538  GLUCAP 140* 115* 105*     Medications:  Scheduled:  . ipratropium  0.5 mg Nebulization QID   And  . albuterol  2.5 mg Nebulization QID  . atenolol  12.5 mg Oral BID  . budesonide-formoterol  2 puff Inhalation BID  . buPROPion  150 mg Oral BID  . busPIRone  15 mg Oral BID  . insulin aspart  0-15 Units Subcutaneous Custom  . ondansetron  4 mg Intravenous QHS  . pantoprazole (PROTONIX) IV  40 mg Intravenous Q24H  . sertraline  100 mg Oral QAC breakfast   Infusions:  . sodium chloride Stopped (02/26/13 1809)   Home Insulin Requirements :   Patient adds 30 units regular insulin to TNA each night.  The final volume of her home TNA, including overfill, is  1780 mL, for an insulin concentration of 16.9 units/L  Home TNA Regimen:   Home TNA per [facility] had been infused daily over 12 hours as follows: 29ml/hr x 1 hr, then 158 ml/hr x 10 hr, then 25ml/hr x1 hr, then off x 10 hr.  This formula provided 85 grams protein/day, 1809 KCal/day.  Staff attempted to use TNA from home as ordered 7/17 but problems were reportedly encountered with the filter and the bag could not be used.    Please note that current [place] policy requires hospital inpatients to have TNA supplied by the inpatient pharmacy rather than using home supply.  Exploring options of G-tube for recurrent SBO and possibility to avoid multiple admissions caused by this - surgery completed 7/25  Estimated Nutritional Needs per RD (7/18): Kcal: 1630-1860, Protein: 80-90 grams, Fluid: 1.7 -2L daily  Current Nutrition: NPO Clinimix E 5/20 + insulin 20 units/L (40 units/2 L bag) cycled over 14 hours.  50 mL/hr x 1 hr, 131 mL/hr x 12 hr, 50 mL/hr x 1 hr, off x 10 hr Fat Emulsion 20%, 18 mL/hr x 14 hr, off x 10  hr Multivitamins and trace elements daily. This regimen provides 84 grams protein/day, 1950 KCal/day  Maintenance [facility]: [facility] at [facility]  Assessment: 71 yo F with metastatic ovarian cancer, on home TNA for SBO, admitted with uncontrolled nausea and vomiting - now has NG tube in place.  Needs continued TNA support.  POD#1 of ex lap, upper endoscopy to verify anatomy and placement of Moss gastrostomy tube  Labs  CBGs: stable 105-140  Electrolytes - WNL/stable except Na slightly low (unable to adjust in TPN)  LFTs - WNL/stable  TGs - WNL  Prealbumin - 17.6 (7/19), 12.8 (7/21)  I/O:  NG output=400 ml yesterday but now s/p surgery for new tube Wt:  Up from 56.2 kg on 7/17 to 59.3 kg on 7/23; 58.5kg on 7/25  Plan:   Continue present cyclic TNA regimen  Continue TNA labs.  CBGs now stable off and on cyclic TNA - continue moderate SSI Novolog during infusion at custom  times during infusion.  F/U CBGs closely.  Hessie Knows, PharmD, BCPS Pager [phone] 02/27/2013 8:00 AM

## 2013-02-28 DIAGNOSIS — D649 Anemia, unspecified: Secondary | ICD-10-CM

## 2013-02-28 LAB — CBC WITH DIFFERENTIAL/PLATELET
Eosinophils Absolute: 0 10*3/uL (ref 0.0–0.7)
Hemoglobin: 8.6 g/dL — ABNORMAL LOW (ref 12.0–15.0)
Lymphs Abs: 1.4 10*3/uL (ref 0.7–4.0)
MCH: 29.2 pg (ref 26.0–34.0)
Monocytes Relative: 14 % — ABNORMAL HIGH (ref 3–12)
Neutrophils Relative %: 65 % (ref 43–77)
RBC: 2.95 MIL/uL — ABNORMAL LOW (ref 3.87–5.11)

## 2013-02-28 LAB — PHOSPHORUS: Phosphorus: 3.9 mg/dL (ref 2.3–4.6)

## 2013-02-28 LAB — BASIC METABOLIC PANEL
BUN: 21 mg/dL (ref 6–23)
CO2: 37 mEq/L — ABNORMAL HIGH (ref 19–32)
Chloride: 93 mEq/L — ABNORMAL LOW (ref 96–112)
Creatinine, Ser: 0.45 mg/dL — ABNORMAL LOW (ref 0.50–1.10)

## 2013-02-28 LAB — GLUCOSE, CAPILLARY
Glucose-Capillary: 178 mg/dL — ABNORMAL HIGH (ref 70–99)
Glucose-Capillary: 198 mg/dL — ABNORMAL HIGH (ref 70–99)

## 2013-02-28 LAB — PREPARE RBC (CROSSMATCH)

## 2013-02-28 MED ORDER — IPRATROPIUM BROMIDE 0.02 % IN SOLN
0.5000 mg | Freq: Four times a day (QID) | RESPIRATORY_TRACT | Status: DC
  Administered 2013-03-01 – 2013-03-02 (×4): 0.5 mg via RESPIRATORY_TRACT
  Filled 2013-02-28 (×5): qty 2.5

## 2013-02-28 MED ORDER — ALBUTEROL SULFATE (5 MG/ML) 0.5% IN NEBU
2.5000 mg | INHALATION_SOLUTION | Freq: Four times a day (QID) | RESPIRATORY_TRACT | Status: DC
  Administered 2013-03-01 – 2013-03-02 (×4): 2.5 mg via RESPIRATORY_TRACT
  Filled 2013-02-28 (×5): qty 0.5

## 2013-02-28 MED ORDER — FAT EMULSION 20 % IV EMUL
250.0000 mL | INTRAVENOUS | Status: AC
Start: 2013-02-28 — End: 2013-03-01
  Administered 2013-02-28: 250 mL via INTRAVENOUS
  Filled 2013-02-28: qty 250

## 2013-02-28 MED ORDER — DIPHENHYDRAMINE HCL 25 MG PO CAPS
25.0000 mg | ORAL_CAPSULE | Freq: Once | ORAL | Status: AC
  Administered 2013-02-28: 25 mg via ORAL
  Filled 2013-02-28: qty 1

## 2013-02-28 MED ORDER — ONDANSETRON HCL 4 MG/2ML IJ SOLN: 8.0000 mg | Freq: Once | INTRAMUSCULAR | Status: DC

## 2013-02-28 MED ORDER — ONDANSETRON 8 MG/NS 50 ML IVPB
8.0000 mg | Freq: Once | INTRAVENOUS | Status: AC
  Administered 2013-02-28: 8 mg via INTRAVENOUS
  Filled 2013-02-28: qty 8

## 2013-02-28 MED ORDER — TRACE MINERALS CR-CU-F-FE-I-MN-MO-SE-ZN IV SOLN
INTRAVENOUS | Status: AC
  Administered 2013-02-28: 19:00:00 via INTRAVENOUS
  Filled 2013-02-28: qty 2000

## 2013-02-28 NOTE — Care Management Note (Signed)
Faxed [facility] orders and resumption of TPN orders to [facility] pharmacy with faxed confirmation received.  [place], [place] [phone]

## 2013-02-28 NOTE — Progress Notes (Signed)
PARENTERAL NUTRITION CONSULT NOTE   Pharmacy Consult for TNA Indication: SBO/Malnutrition   Allergies  Allergen Reactions  . Promethazine     "Makes me feel like I want to jump out of my skin"    Patient Measurements: Height: 5\' 4"  (162.6 cm) Weight: 129 lb (58.514 kg) IBW/kg (Calculated) : 54.7 Usual Weight:122 lb  Vital Signs: Temp: 98.1 F (36.7 C) (07/27 0538) Temp src: Oral (07/27 0538) BP: 150/82 mmHg (07/27 0538) Pulse Rate: 98 (07/27 0538) Intake/Output from previous day: 07/26 0701 - 07/27 0700 In: -  Out: 2750 [Urine:900; Drains:1850]  Labs:  Recent Labs  02/26/13 0805 02/27/13 0843  WBC 7.0 7.5  HGB 8.6* 7.9*  HCT 26.7* 24.7*  PLT 452* 398  INR 0.99  --      Recent Labs  02/25/13 0850 02/26/13 0805 02/27/13 0843  NA 136 134* 134*  K 4.1 4.1 3.8  CL 97 96 95*  CO2 32 31 33*  GLUCOSE 99 229* 83  BUN 23 22 20   CREATININE 0.51 0.49* 0.56  CALCIUM 8.5 8.3* 8.5  MG 1.7  --   --   PHOS 4.2  --   --   PROT 5.9* 6.0 5.6*  ALBUMIN 2.1* 2.1* 1.8*  AST 16 13 29   ALT 13 12 21   ALKPHOS 110 112 110  BILITOT 0.2* 0.2* 0.2*   Estimated Creatinine Clearance: 57.3 ml/min (by C-G formula based on Cr of 0.56).    Recent Labs  02/27/13 1635 02/27/13 2100 02/28/13 0702  GLUCAP 112* 178* 198*     Medications:  Scheduled:  . albuterol  2.5 mg Nebulization Q4H WA   And  . ipratropium  0.5 mg Nebulization QID  . atenolol  12.5 mg Oral BID  . budesonide-formoterol  2 puff Inhalation BID  . buPROPion  150 mg Oral BID  . busPIRone  15 mg Oral BID  . insulin aspart  0-15 Units Subcutaneous Custom  . pantoprazole  40 mg Oral Daily  . sertraline  100 mg Oral QAC breakfast   Infusions:  . sodium chloride Stopped (02/26/13 1809)   Home Insulin Requirements :   Patient adds 30 units regular insulin to TNA each night.  The final volume of her home TNA, including overfill, is 1780 mL, for an insulin concentration of 16.9 units/L  Home TNA Regimen:    Home TNA per [facility] had been infused daily over 12 hours as follows: 85ml/hr x 1 hr, then 158 ml/hr x 10 hr, then 15ml/hr x1 hr, then off x 10 hr.  This formula provided 85 grams protein/day, 1809 KCal/day.  Staff attempted to use TNA from home as ordered 7/17 but problems were reportedly encountered with the filter and the bag could not be used.    Please note that current [place] policy requires hospital inpatients to have TNA supplied by the inpatient pharmacy rather than using home supply.  Exploring options of G-tube for recurrent SBO and possibility to avoid multiple admissions caused by this - surgery completed 7/25  Estimated Nutritional Needs per RD (7/18): Kcal: 1630-1860, Protein: 80-90 grams, Fluid: 1.7 -2L daily  Current Nutrition: NPO Clinimix E 5/20 + insulin 20 units/L (40 units/2 L bag) cycled over 14 hours.  50 mL/hr x 1 hr, 131 mL/hr x 12 hr, 50 mL/hr x 1 hr, off x 10 hr Fat Emulsion 20%, 18 mL/hr x 14 hr, off x 10 hr Multivitamins and trace elements daily. This regimen provides 84 grams protein/day, 1950  KCal/day  Maintenance IVF: NS at [facility]  Assessment: 72 yo F with metastatic ovarian cancer, on home TNA for SBO, admitted with uncontrolled nausea and vomiting - now has NG tube in place.  Needs continued TNA support.  POD#1 of ex lap, upper endoscopy to verify anatomy and placement of Moss gastrostomy tube  Labs  CBGs: stable 112, 178, 198   Electrolytes - WNL/stable except Na slightly low (unable to adjust in TPN)  LFTs - WNL/stable  TGs - WNL  Prealbumin - 17.6 (7/19), 12.8 (7/21)  I/O:  Drains =1850 ml output last 24hr Wt:  Up from 56.2 kg on 7/17 to 59.3 kg on 7/23; 58.5kg on 7/25  Plan:   Continue present cyclic TNA regimen  Continue TNA labs.  CBGs now above goal of < 150 per policy but I feel comfortable with any reading < 200 with patient going home soon with TNA and to help prevent hypoglycemia when TNA is turned off  Hessie Knows, PharmD, BCPS Pager [phone] 02/28/2013 8:22 AM

## 2013-02-28 NOTE — Care Management Note (Signed)
[name] [name] is supposed to go home tomorrow to resume TNA with [facility] services and with RN and OT services. Confirmed with patient that she is active with [facility]. Spoke with [facility] pharmacy staff, [place], who states they will need resumption of orders for TNA per home health protocol. Spoke with Asher Muir with [facility] who also states resumption of orders will be needed for RN/OT services as well. [facility] pharmacy reports they will need orders the sooner the better in order to have TNA ready when patient is discharged. Made patient's nurse aware that orders are needed. Will fax labs per [facility] pharmacy request to [facility] in the meantime. Made patient and family aware that attempts are being made to have things in order prior to discharge. Will leave note for inpatient RNCM to follow up on Monday as well. [place], [place] [phone]

## 2013-02-28 NOTE — Progress Notes (Signed)
TAUHEEDAH BOK   DOB:[birth year]   ZO#:[id number]   JWJ#:[id number]  Subjective: Trishna.slept poorly because we switched her meds to po; discussed this AM; pain is well controlled on current meds; no significant discomfort related to PEG tube; no family in room  Objective: middle aged white woman examined sitting up at bedside  Filed Vitals:   02/28/13 0538  BP: 150/82  Pulse: 98  Temp: 98.1 F (36.7 C)  Resp: 20    Body mass index is 22.13 kg/(m^2).  Intake/Output Summary (Last 24 hours) at 02/28/13 0726 Last data filed at 02/28/13 0539  Gross per 24 hour  Intake      0 ml  Output   2750 ml  Net  -2750 ml     Sclerae unicteric  Oropharynx clear; minimal coating, no thrush  No cervical or supraclavicularl adenopathy  Lungs no rales or rhonchi--auscultated anterolaterally  Heart regular rate and rhythm  Abdomen shows G tube in place connected to gravity drainage; bandage is clean and dry; nontender  MSK, no peripheral edema  Neuro nonfocal.  Breast exam: deferred  CBG (last 3)   Recent Labs  02/27/13 1635 02/27/13 2100 02/28/13 0702  GLUCAP 112* 178* 198*     Labs:  Lab Results  Component Value Date   WBC 7.5 02/27/2013   HGB 7.9* 02/27/2013   HCT 24.7* 02/27/2013   MCV 90.5 02/27/2013   PLT 398 02/27/2013   NEUTROABS 4.4 02/27/2013    @LASTCHEMISTRY @  Urine Studies No results found for this basename: UACOL, UAPR, USPG, UPH, UTP, UGL, UKET, UBIL, UHGB, UNIT, UROB, ULEU, UEPI, UWBC, URBC, UBAC, CAST, CRYS, UCOM, BILUA,  in the last 72 hours  Basic Metabolic Panel:  Recent Labs Lab 02/22/13 0800 02/23/13 0940 02/24/13 0815 02/25/13 0850 02/26/13 0805 02/27/13 0843  NA 135 136 134* 136 134* 134*  K 3.8 4.0 3.8 4.1 4.1 3.8  CL 98 96 95* 97 96 95*  CO2 31 31 33* 32 31 33*  GLUCOSE 181* 129* 178* 99 229* 83  BUN 21 23 25* 23 22 20   CREATININE 0.48* 0.51 0.51 0.51 0.49* 0.56  CALCIUM 8.9 8.9 8.6 8.5 8.3* 8.5  MG 1.6  --  1.6 1.7  --   --   PHOS 4.6  --    --  4.2  --   --    GFR Estimated Creatinine Clearance: 57.3 ml/min (by C-G formula based on Cr of 0.56). Liver Function Tests:  Recent Labs Lab 02/22/13 0800 02/23/13 0940 02/25/13 0850 02/26/13 0805 02/27/13 0843  AST 16 19 16 13 29   ALT 15 16 13 12 21   ALKPHOS 107 114 110 112 110  BILITOT 0.2* 0.3 0.2* 0.2* 0.2*  PROT 6.0 6.2 5.9* 6.0 5.6*  ALBUMIN 2.2* 2.3* 2.1* 2.1* 1.8*   No results found for this basename: LIPASE, AMYLASE,  in the last 168 hours No results found for this basename: AMMONIA,  in the last 168 hours Coagulation profile  Recent Labs Lab 02/23/13 0940 02/26/13 0805  INR 0.93 0.99    CBC:  Recent Labs Lab 02/22/13 0800 02/23/13 0940 02/26/13 0805 02/27/13 0843  WBC 8.7 8.7 7.0 7.5  NEUTROABS 5.6 6.3  --  4.4  HGB 8.9* 8.9* 8.6* 7.9*  HCT 27.1* 27.1* 26.7* 24.7*  MCV 89.4 88.9 89.9 90.5  PLT 396 426* 452* 398   Cardiac Enzymes: No results found for this basename: CKTOTAL, CKMB, CKMBINDEX, TROPONINI,  in the last 168 hours BNP: No components found with  this basename: POCBNP,  CBG:  Recent Labs Lab 02/27/13 0538 02/27/13 1410 02/27/13 1635 02/27/13 2100 02/28/13 0702  GLUCAP 105* 115* 112* 178* 198*   D-Dimer No results found for this basename: DDIMER,  in the last 72 hours Hgb A1c No results found for this basename: HGBA1C,  in the last 72 hours Lipid Profile No results found for this basename: CHOL, HDL, LDLCALC, TRIG, CHOLHDL, LDLDIRECT,  in the last 72 hours Thyroid function studies No results found for this basename: TSH, T4TOTAL, FREET3, T3FREE, THYROIDAB,  in the last 72 hours Anemia work up No results found for this basename: VITAMINB12, FOLATE, FERRITIN, TIBC, IRON, RETICCTPCT,  in the last 72 hours Microbiology Recent Results (from the past 240 hour(s))  SURGICAL PCR SCREEN     Status: None   Collection Time    02/26/13 11:59 AM      Result Value Range Status   MRSA, PCR NEGATIVE  NEGATIVE Final   Staphylococcus  aureus NEGATIVE  NEGATIVE Final   Comment:            The Xpert SA Assay (FDA     approved for NASAL specimens     in patients over 65 years of age),     is one component of     a comprehensive surveillance     program.  Test performance has     been validated by [employer] for patients greater     than or equal to 27 year old.     It is not intended     to diagnose infection nor to     guide or monitor treatment.      Studies:  No results found.  Assessment: 72 y.o. 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), admitted with small bowel obstruction (1) status post left modified radical mastectomy October 1993 for a T2 N0 invasive ductal breast, estrogen and progesterone receptor positive cancer treated adjuvantly with MFL (methotrexate/fluorouracil/leucovorin) chemotherapy x6, completed April 1994, followed by 5 years of tamoxifen completed April 1999, with no evidence of recurrence  (2) now status post TAH BSO, omentectomy, and suboptimal debulking 10/29/2011 for a high-grade serous adenocarcinoma of the ovary, pT3c NX (Stage IIIC) , s/p eight cycles of carboplatin/paclitaxel completed 05/05/2012 (initial CA125 of 2050.7 normalized after cycle 5)  (3) recurrent disease documented by rising CEA 125 an abdominal CT scan 09/21/2012.  (4) COPD with ongoing tobacco abuse  (5) diabetes mellitus  (6) genetic testing for Lynch syndrome pending.  (7) status post hospitalization in July 2013 for stroke/small acute infarcts in the left sub insular white matter and coronal radiata.  (8) port in place  (9) left upper lobe 6 mm subpleural nodule, without associated hypermetabolic activity (likely well below the resolution of PET imaging) noted August 2013  (10) CTs obtained in [place] 2014 showed evidence of significant progression of disease with widespread intraperitoneal metastases and a moderate volume of presumably malignant  ascites.  (11) treated with Doxil on a Q. three-week basis, first dose given on 10/06/2012, discontinued after 5 cycles, last dose given on 12/29/2012, secondary to hand/foot syndrome and skin changes.  (12) cholecystitis, with cholecystectomy pending  (13) Hx of recurring ascites, requiring therapeutic paracentesis and Aspira drain, removed on 01/15/13  (14) Malnutrition, on TPN. Apparent infiltration of TPN noted on 12/15/12, resolved  (15) Hx of small bowel obstruction requiring hospitalization, 01/06/2013 - 01/08/2013  (16) carboplatin to be  given every 21 days, first dose on 01/26/2013, second dose 02/16/2013 (17) persistent SBO, s/o gastric tube placement 02/26/2013  Plan: We are transfusing 2 units PRBCs today; removing Foley; starting po liquids; and encouraging ambulation with assistance. Am consulting SW so we can have HHN visit pt tomorrow to resume TNA and to help family with gastric tube. She is scheduled to see [place] 8/5 for her 3d cycle of single agent carboplatin. Plan is to contiinue at least 6 cycles, then reassess     .Lowella Dell, MD 02/28/2013  7:26 AM

## 2013-02-28 NOTE — Progress Notes (Signed)
Pt with episode of vomiting. MD made aware. New order given for 8 mg zofran iv x 1. MD also made aware of pt's request for IV pain and nausea medication since she had no relief last night from PO pain and nausea meds. MD ordered 1 time order of PO benadryl. Order carried out. Pt made aware. Vwilliams,rn.

## 2013-02-28 NOTE — Progress Notes (Signed)
2 Days Post-Op  Subjective: No new issues  Objective: Vital signs in last 24 hours: Temp:  [97.8 F (36.6 C)-98.2 F (36.8 C)] 98.1 F (36.7 C) (07/27 0538) Pulse Rate:  [98-112] 98 (07/27 0538) Resp:  [16-20] 20 (07/27 0538) BP: (112-150)/(64-82) 150/82 mmHg (07/27 0538) SpO2:  [95 %-98 %] 98 % (07/27 0538) Last BM Date: 02/16/13  Intake/Output from previous day: 07/26 0701 - 07/27 0700 In: -  Out: 2750 [Urine:900; Drains:1850] Intake/Output this shift:    General appearance: alert, cooperative and no distress GI: soft, appropriate tenderness, incision looks okay, tube looks fine, she has clear gastric output, no peritoneal signs  Lab Results:   Recent Labs  02/26/13 0805 02/27/13 0843  WBC 7.0 7.5  HGB 8.6* 7.9*  HCT 26.7* 24.7*  PLT 452* 398   BMET  Recent Labs  02/26/13 0805 02/27/13 0843  NA 134* 134*  K 4.1 3.8  CL 96 95*  CO2 31 33*  GLUCOSE 229* 83  BUN 22 20  CREATININE 0.49* 0.56  CALCIUM 8.3* 8.5   PT/INR  Recent Labs  02/26/13 0805  LABPROT 12.9  INR 0.99   ABG No results found for this basename: PHART, PCO2, PO2, HCO3,  in the last 72 hours  Studies/Results: No results found.  Anti-infectives: Anti-infectives   Start     Dose/Rate Route Frequency Ordered Stop   02/26/13 1230  cefOXitin (MEFOXIN) 2 g in dextrose 5 % 50 mL IVPB     2 g 100 mL/hr over 30 Minutes Intravenous  Once 02/26/13 1223 02/26/13 1329      Assessment/Plan: s/p Procedure(s): EXPLORATORY LAPAROTOMY WITH GASTROSTOMY TUBE PLACEMENT  (N/A) UPPER GI ENDOSCOPY sips of clears for comfort, tube care instruction and flushing. otherwise tube looks fine  LOS: 10 days    Lodema Pilot DAVID 02/28/2013

## 2013-03-01 ENCOUNTER — Encounter (HOSPITAL_COMMUNITY): Payer: Self-pay | Admitting: Surgery

## 2013-03-01 LAB — DIFFERENTIAL
Basophils Absolute: 0 10*3/uL (ref 0.0–0.1)
Basophils Relative: 0 % (ref 0–1)
Eosinophils Absolute: 0 10*3/uL (ref 0.0–0.7)
Monocytes Relative: 15 % — ABNORMAL HIGH (ref 3–12)
Neutrophils Relative %: 62 % (ref 43–77)

## 2013-03-01 LAB — TYPE AND SCREEN
ABO/RH(D): B POS
Antibody Screen: NEGATIVE
Unit division: 0
Unit division: 0

## 2013-03-01 LAB — COMPREHENSIVE METABOLIC PANEL
Alkaline Phosphatase: 116 U/L (ref 39–117)
BUN: 25 mg/dL — ABNORMAL HIGH (ref 6–23)
CO2: 44 mEq/L (ref 19–32)
Chloride: 92 mEq/L — ABNORMAL LOW (ref 96–112)
Creatinine, Ser: 0.51 mg/dL (ref 0.50–1.10)
GFR calc Af Amer: >90 mL/min (ref >90)
GFR calc non Af Amer: >90 mL/min (ref >90)
Glucose, Bld: 129 mg/dL — ABNORMAL HIGH (ref 70–99)
Potassium: 2.6 mEq/L — CL (ref 3.5–5.1)
Total Bilirubin: 0.3 mg/dL (ref 0.3–1.2)

## 2013-03-01 LAB — CBC
Hemoglobin: 11.1 g/dL — ABNORMAL LOW (ref 12.0–15.0)
MCH: 28.6 pg (ref 26.0–34.0)
MCHC: 33.1 g/dL (ref 30.0–36.0)
Platelets: 320 10*3/uL (ref 150–400)
RDW: 17.7 % — ABNORMAL HIGH (ref 11.5–15.5)

## 2013-03-01 LAB — MAGNESIUM: Magnesium: 1.7 mg/dL (ref 1.5–2.5)

## 2013-03-01 LAB — GLUCOSE, CAPILLARY

## 2013-03-01 LAB — PREALBUMIN: Prealbumin: 8.1 mg/dL — ABNORMAL LOW (ref 17.0–34.0)

## 2013-03-01 LAB — PHOSPHORUS: Phosphorus: 4.7 mg/dL — ABNORMAL HIGH (ref 2.3–4.6)

## 2013-03-01 MED ORDER — MORPHINE SULFATE 10 MG/5ML PO SOLN: 10.0000 mg | Freq: Once | ORAL | Status: DC

## 2013-03-01 MED ORDER — LORAZEPAM 2 MG/ML PO CONC
1.0000 mg | Freq: Three times a day (TID) | ORAL | Status: DC
  Administered 2013-03-01 – 2013-03-02 (×4): 1 mg via ORAL
  Filled 2013-03-01 (×5): qty 1

## 2013-03-01 MED ORDER — SODIUM CHLORIDE 0.9 % IV BOLUS (SEPSIS)
100.0000 mL | Freq: Once | INTRAVENOUS | Status: AC
  Administered 2013-03-01: 100 mL via INTRAVENOUS

## 2013-03-01 MED ORDER — PROCHLORPERAZINE 25 MG RE SUPP
25.0000 mg | Freq: Three times a day (TID) | RECTAL | Status: DC | PRN
  Administered 2013-03-01: 25 mg via RECTAL
  Filled 2013-03-01 (×2): qty 1

## 2013-03-01 MED ORDER — MORPHINE SULFATE (CONCENTRATE) 10 MG /0.5 ML PO SOLN
10.0000 mg | ORAL | Status: DC | PRN
  Administered 2013-03-01 – 2013-03-02 (×6): 10 mg via ORAL
  Filled 2013-03-01 (×7): qty 0.5

## 2013-03-01 MED ORDER — FENTANYL 12 MCG/HR TD PT72
12.5000 ug | MEDICATED_PATCH | TRANSDERMAL | Status: DC
  Administered 2013-03-01: 12.5 ug via TRANSDERMAL
  Filled 2013-03-01: qty 1

## 2013-03-01 MED ORDER — PANTOPRAZOLE SODIUM 40 MG PO PACK
40.0000 mg | PACK | Freq: Every day | ORAL | Status: DC
  Administered 2013-03-01: 40 mg via ORAL
  Filled 2013-03-01 (×2): qty 20

## 2013-03-01 MED ORDER — MORPHINE SULFATE (CONCENTRATE) 10 MG /0.5 ML PO SOLN: 10.0000 mg | Freq: Once | ORAL | Status: DC

## 2013-03-01 MED ORDER — LORAZEPAM 2 MG/ML PO CONC
0.5000 mg | Freq: Four times a day (QID) | ORAL | Status: DC | PRN
  Administered 2013-03-01 (×2): 0.5 mg via ORAL
  Filled 2013-03-01 (×3): qty 1

## 2013-03-01 MED ORDER — POTASSIUM CHLORIDE 10 MEQ/100ML IV SOLN
10.0000 meq | INTRAVENOUS | Status: AC
  Administered 2013-03-01 (×3): 10 meq via INTRAVENOUS
  Filled 2013-03-01 (×3): qty 100

## 2013-03-01 MED ORDER — FAT EMULSION 20 % IV EMUL
250.0000 mL | INTRAVENOUS | Status: AC
  Administered 2013-03-01: 250 mL via INTRAVENOUS
  Filled 2013-03-01: qty 250

## 2013-03-01 MED ORDER — TRACE MINERALS CR-CU-F-FE-I-MN-MO-SE-ZN IV SOLN
INTRAVENOUS | Status: AC
  Administered 2013-03-01: 17:00:00 via INTRAVENOUS
  Filled 2013-03-01: qty 2000

## 2013-03-01 MED ORDER — ONDANSETRON 4 MG PO TBDP
4.0000 mg | ORAL_TABLET | Freq: Three times a day (TID) | ORAL | Status: DC | PRN
  Administered 2013-03-01 (×2): 4 mg via ORAL
  Filled 2013-03-01 (×2): qty 1

## 2013-03-01 MED ORDER — POTASSIUM CHLORIDE 10 MEQ/100ML IV SOLN
10.0000 meq | INTRAVENOUS | Status: AC
Start: 2013-03-01 — End: 2013-03-01
  Administered 2013-03-01 (×2): 10 meq via INTRAVENOUS
  Filled 2013-03-01 (×2): qty 100

## 2013-03-01 NOTE — Progress Notes (Signed)
In to follow up with patient at bedside regarding COPD Gold initiative support.  Patient evaluated for community based chronic disease management services with [facility].  Spoke with patient at bedside to explain [facility] Care Management services.  Patient will discharge to home tomorrow with [facility].  She would like to discuss this service with Dr. Darnelle Catalan before engaging.  Assured patient that she could access the services post discharge by contacting our office.  She expressed understanding that before services can be activated she must sign and return the consent form enclosed in her packet.  Left contact information and [facility] literature at bedside with patient. Of note, [facility] Care Management services does not replace or interfere with any services that are arranged by inpatient case management or social work.  For additional questions or referrals please contact Anibal Henderson BSN RN [facility] Liaison at [phone].

## 2013-03-01 NOTE — Progress Notes (Signed)
Pt does not want to have a peripheral iv placed.  Her anti-nausea medication is IV.   Most of her other meds are PO now.  Pt vomited moderate amount at 2330 after her HS meds, pain meds and xanax were given.  Pt and dtr are requesting 3 things.  First, for ODT zofran.  Ativan in a PO liquid solution.  And an increase in frequency of pain medication.  VSS. Dr Roanna Raider on call and new orders received.  New meds given.  Will continue to monitor. Dtr at bedside

## 2013-03-01 NOTE — Progress Notes (Signed)
CRITICAL VALUE ALERT  Critical value received:  K 2.6, CO2 44  Date of notification:  03/01/13  Time of notification:  0938  Critical value read back:yes  Nurse who received alert:  Eugene Garnet RN  MD notified (1st page):  Dr. Darnelle Catalan  Time of first page:  [phone]  MD notified (2nd page):  Time of second page:  Responding MD:  Dr. Darnelle Catalan  Time MD responded:  [phone] Orders received

## 2013-03-01 NOTE — Progress Notes (Signed)
PARENTERAL NUTRITION CONSULT NOTE   Pharmacy Consult for TNA Indication: SBO/Malnutrition   Allergies  Allergen Reactions  . Promethazine     "Makes me feel like I want to jump out of my skin"    Patient Measurements: Height: 5\' 4"  (162.6 cm) Weight: 129 lb (58.514 kg) IBW/kg (Calculated) : 54.7 Usual Weight:122 lb  Vital Signs: Temp: 99.1 F (37.3 C) (07/28 0722) Temp src: Oral (07/28 0722) BP: 118/51 mmHg (07/28 0722) Pulse Rate: 110 (07/28 0722) Intake/Output from previous day: 07/27 0701 - 07/28 0700 In: 5699 [P.O.:320; I.V.:500; Blood:25; WJX:9147] Out: 3350 [Urine:250; Drains:3100]  Labs:  Recent Labs  02/26/13 0805 02/27/13 0843 02/28/13 0832  WBC 7.0 7.5 7.0  HGB 8.6* 7.9* 8.6*  HCT 26.7* 24.7* 26.2*  PLT 452* 398 360  INR 0.99  --   --      Recent Labs  02/26/13 0805 02/27/13 0843 02/28/13 0832  NA 134* 134* 136  K 4.1 3.8 3.1*  CL 96 95* 93*  CO2 31 33* 37*  GLUCOSE 229* 83 118*  BUN 22 20 21   CREATININE 0.49* 0.56 0.45*  CALCIUM 8.3* 8.5 8.6  MG  --   --  1.5  PHOS  --   --  3.9  PROT 6.0 5.6*  --   ALBUMIN 2.1* 1.8*  --   AST 13 29  --   ALT 12 21  --   ALKPHOS 112 110  --   BILITOT 0.2* 0.2*  --    Estimated Creatinine Clearance: 57.3 ml/min (by C-G formula based on Cr of 0.45).    Recent Labs  02/28/13 1146 02/28/13 1708 02/28/13 2143  GLUCAP 134* 79 180*     Medications:  Scheduled:  . albuterol  2.5 mg Nebulization QID   And  . ipratropium  0.5 mg Nebulization QID  . atenolol  12.5 mg Oral BID  . budesonide-formoterol  2 puff Inhalation BID  . buPROPion  150 mg Oral BID  . busPIRone  15 mg Oral BID  . insulin aspart  0-15 Units Subcutaneous Custom  . morphine CONCENTRATE  10 mg Oral Once  . pantoprazole  40 mg Oral Daily  . sertraline  100 mg Oral QAC breakfast   Infusions:  . Marland KitchenTPN (CLINIMIX-E) Adult     And  . fat emulsion 250 mL (02/28/13 1837)  . sodium chloride Stopped (02/26/13 1809)   Home Insulin  Requirements :   Patient adds 30 units regular insulin to TNA each night.  The final volume of her home TNA, including overfill, is 1780 mL, for an insulin concentration of 16.9 units/L  Home TNA Regimen:   Home TNA per [facility] had been infused daily over 12 hours as follows: 63ml/hr x 1 hr, then 158 ml/hr x 10 hr, then 52ml/hr x1 hr, then off x 10 hr.  This formula provided 85 grams protein/day, 1809 KCal/day.  Staff attempted to use TNA from home as ordered 7/17 but problems were reportedly encountered with the filter and the bag could not be used.    Please note that current [place] policy requires hospital inpatients to have TNA supplied by the inpatient pharmacy rather than using home supply.  Gastric tube placement on 02/26/2013  Inpatient insulin requirements:  2 units SSI documented in past 24 hours 40 units insulin/2L bag TNA, Based on  Cyclic rate patient is receiving 33.4 units of inuslin while TNA is infusing.   Estimated Nutritional Needs per RD (7/18): Kcal: 1630-1860,  Protein: 80-90 grams, Fluid: 1.7 -2L daily  Current Nutrition: NPO Clinimix E 5/20 + insulin 20 units/L (40 units/2 L bag) cycled over 14 hours.  50 mL/hr x 1 hr, 131 mL/hr x 12 hr, 50 mL/hr x 1 hr, off x 10 hr Fat Emulsion 20%, 18 mL/hr x 14 hr, off x 10 hr Multivitamins and trace elements daily. This regimen provides 84 grams protein/day, 1950 KCal/day  Maintenance IVF: NS at [facility]  Assessment: 72 yo F with metastatic ovarian cancer, on home TNA for SBO, admitted with uncontrolled nausea and vomiting - now has NG tube in place.  Needs continued TNA support.    Labs  CBGs: Within goal range while TNA off,  Increased to 180 while TNA running.   Electrolytes - Potassium is low 2.6 - Give a total of 50 mEq of potassium today; Na trending up, cannot adjust in TNA, will repeat BMET in AM.  Phosphorus also trending up (4.7) today, will repeat in AM  LFTs - WNL/stable  TGs - WNL  Prealbumin  - 17.6 (7/19), 12.8 (7/21); 7/28 prealbumin pending   I/O:  Drains = 3100 ml output last 24hr Wt:  Up from 56.2 kg on 7/17 to 59.3 kg on 7/23; 58.5kg on 7/25  Plan:   Continue current cyclic TNA regimen: Clinimix E 5/20 + insulin 20 units/L (40 units/2 L bag) cycled over 14 hours.  50 mL/hr x 1 hr, 131 mL/hr x 12 hr, 50 mL/hr x 1 hr, off x 10 hr.  Fat Emulsion 20%, 18 mL/hr x 14 hr, off x 10 hr  Replace potassium with a total of 50 mEq potassium IV  Repeat BMET and phos in AM  MVI and trace elements daily  Routine TNA labs Mondays and Thursdays   CBGs above goal of < 150 while TNA is running, However given CBG's within goal while TNA off feel comfortable with any reading < 200 to avoid hypoglycemia. Will continue current SSI insulin regimen   Dr. Darnelle Catalan: I placed a copy of the patients Home TNA regimen in her shadow chart with the electrolyte contents highlighted - These are adjusted per the pharmacist at [facility].  - The Home TNA prior to admission contained 20 mEq of potassium per day.   Authur Cubit, Loma Messing PharmD Pager #: [phone]  7:47 AM 03/01/2013

## 2013-03-01 NOTE — Progress Notes (Signed)
[name] [name]   DOB:23-Oct-1940   WU#:[id number]   GNF#:[id number]  Subjective: Shirell..had a very difficult day yesterday; she still had some nausea; her pain was poorly controlled; she is a bit confused and wonders if we need to have a "family conference" but she is not clear what this would be about; clearly she is not ready for d/c today; she did tolerate the transfusion yesterday without event; daughter in room  Objective: middle aged white woman examined sitting up at bedside  Filed Vitals:   03/01/13 0722  BP: 118/51  Pulse: 110  Temp: 99.1 F (37.3 C)  Resp: 16    Body mass index is 22.13 kg/(m^2).  Intake/Output Summary (Last 24 hours) at 03/01/13 0836 Last data filed at 03/01/13 0600  Gross per 24 hour  Intake   2724 ml  Output   3300 ml  Net   -576 ml     Sclerae unicteric  No cervical or supraclavicularl adenopathy  Lungs no rales or rhonchi--auscultated anterolaterally  Heart regular rate and rhythm  Abdomen shows G tube in place connected to gravity drainage;  MSK, no peripheral edema  Neuro nonfocal.  Breast exam: deferred  CBG (last 3)   Recent Labs  02/28/13 1708 02/28/13 2143 03/01/13 0754  GLUCAP 79 180* 140*     Labs:  Lab Results  Component Value Date   WBC 7.0 02/28/2013   HGB 8.6* 02/28/2013   HCT 26.2* 02/28/2013   MCV 88.8 02/28/2013   PLT 360 02/28/2013   NEUTROABS 4.6 02/28/2013    @LASTCHEMISTRY @  Urine Studies No results found for this basename: UACOL, UAPR, USPG, UPH, UTP, UGL, UKET, UBIL, UHGB, UNIT, UROB, ULEU, UEPI, UWBC, URBC, UBAC, CAST, CRYS, UCOM, BILUA,  in the last 72 hours  Basic Metabolic Panel:  Recent Labs Lab 02/24/13 0815 02/25/13 0850 02/26/13 0805 02/27/13 0843 02/28/13 0832  NA 134* 136 134* 134* 136  K 3.8 4.1 4.1 3.8 3.1*  CL 95* 97 96 95* 93*  CO2 33* 32 31 33* 37*  GLUCOSE 178* 99 229* 83 118*  BUN 25* 23 22 20 21   CREATININE 0.51 0.51 0.49* 0.56 0.45*  CALCIUM 8.6 8.5 8.3* 8.5 8.6  MG 1.6 1.7  --    --  1.5  PHOS  --  4.2  --   --  3.9   GFR Estimated Creatinine Clearance: 57.3 ml/min (by C-G formula based on Cr of 0.45). Liver Function Tests:  Recent Labs Lab 02/23/13 0940 02/25/13 0850 02/26/13 0805 02/27/13 0843  AST 19 16 13 29   ALT 16 13 12 21   ALKPHOS 114 110 112 110  BILITOT 0.3 0.2* 0.2* 0.2*  PROT 6.2 5.9* 6.0 5.6*  ALBUMIN 2.3* 2.1* 2.1* 1.8*   No results found for this basename: LIPASE, AMYLASE,  in the last 168 hours No results found for this basename: AMMONIA,  in the last 168 hours Coagulation profile  Recent Labs Lab 02/23/13 0940 02/26/13 0805  INR 0.93 0.99    CBC:  Recent Labs Lab 02/23/13 0940 02/26/13 0805 02/27/13 0843 02/28/13 0832  WBC 8.7 7.0 7.5 7.0  NEUTROABS 6.3  --  4.4 4.6  HGB 8.9* 8.6* 7.9* 8.6*  HCT 27.1* 26.7* 24.7* 26.2*  MCV 88.9 89.9 90.5 88.8  PLT 426* 452* 398 360   Cardiac Enzymes: No results found for this basename: CKTOTAL, CKMB, CKMBINDEX, TROPONINI,  in the last 168 hours BNP: No components found with this basename: POCBNP,  CBG:  Recent Labs Lab  02/28/13 0702 02/28/13 1146 02/28/13 1708 02/28/13 2143 03/01/13 0754  GLUCAP 198* 134* 79 180* 140*   D-Dimer No results found for this basename: DDIMER,  in the last 72 hours Hgb A1c No results found for this basename: HGBA1C,  in the last 72 hours Lipid Profile No results found for this basename: CHOL, HDL, LDLCALC, TRIG, CHOLHDL, LDLDIRECT,  in the last 72 hours Thyroid function studies No results found for this basename: TSH, T4TOTAL, FREET3, T3FREE, THYROIDAB,  in the last 72 hours Anemia work up No results found for this basename: VITAMINB12, FOLATE, FERRITIN, TIBC, IRON, RETICCTPCT,  in the last 72 hours Microbiology Recent Results (from the past 240 hour(s))  SURGICAL PCR SCREEN     Status: None   Collection Time    02/26/13 11:59 AM      Result Value Range Status   MRSA, PCR NEGATIVE  NEGATIVE Final   Staphylococcus aureus NEGATIVE   NEGATIVE Final   Comment:            The Xpert SA Assay (FDA     approved for NASAL specimens     in patients over 69 years of age),     is one component of     a comprehensive surveillance     program.  Test performance has     been validated by [employer] for patients greater     than or equal to 63 year old.     It is not intended     to diagnose infection nor to     guide or monitor treatment.      Studies:  No results found.  Assessment: 72 y.o. 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), admitted with small bowel obstruction (1) status post left modified radical mastectomy October 1993 for a T2 N0 invasive ductal breast, estrogen and progesterone receptor positive cancer treated adjuvantly with MFL (methotrexate/fluorouracil/leucovorin) chemotherapy x6, completed April 1994, followed by 5 years of tamoxifen completed April 1999, with no evidence of recurrence  (2) now status post TAH BSO, omentectomy, and suboptimal debulking 10/29/2011 for a high-grade serous adenocarcinoma of the ovary, pT3c NX (Stage IIIC) , s/p eight cycles of carboplatin/paclitaxel completed 05/05/2012 (initial CA125 of 2050.7 normalized after cycle 5)  (3) recurrent disease documented by rising CEA 125 an abdominal CT scan 09/21/2012.  (4) COPD with ongoing tobacco abuse  (5) diabetes mellitus  (6) genetic testing for Lynch syndrome pending.  (7) status post hospitalization in July 2013 for stroke/small acute infarcts in the left sub insular white matter and coronal radiata.  (8) port in place  (9) left upper lobe 6 mm subpleural nodule, without associated hypermetabolic activity (likely well below the resolution of PET imaging) noted August 2013  (10) CTs obtained in [place] 2014 showed evidence of significant progression of disease with widespread intraperitoneal metastases and a moderate volume of presumably malignant ascites.  (11) treated  with Doxil on a Q. three-week basis, first dose given on 10/06/2012, discontinued after 5 cycles, last dose given on 12/29/2012, secondary to hand/foot syndrome and skin changes.  (12) cholecystitis, with cholecystectomy pending  (13) Hx of recurring ascites, requiring therapeutic paracentesis and Aspira drain, removed on 01/15/13  (14) Malnutrition, on TPN. Apparent infiltration of TPN noted on 12/15/12, resolved  (15) Hx of small bowel obstruction requiring hospitalization, 01/06/2013 - 01/08/2013  (16) carboplatin to be given every 21 days, first dose on 01/26/2013, second  dose 02/16/2013 (17) persistent SBO, s/o gastric tube placement 02/26/2013  Plan: I am changing all meds to liquid or crushable form, or per rectum, or transdermal--discussed with patient and pharmacy; hopefully we can cme up with a regimen that will work for her; basically it is not clear how muchshe can absorb through her stomach and upper intestine. I think we can control her pain, anxiety and nausea, and continue the TNA and hydration at home. I have asked her to let me know if things are not going well this PM so we can make more adjustments as needed. Otherwise the plan is for d/c in AM    .Lowella Dell, MD 03/01/2013  8:36 AM

## 2013-03-01 NOTE — Progress Notes (Signed)
Patient interviewed and examined. Agree with treatment plans.  She is better this afternoon. Less nausea and less pain with fentanyl. Gastrostomy post on gravity drainage. Abdomen soft.  Hoping to go home tomorrow. See Dr. Daphine Deutscher in 7-10 days for staple removal.    Angelia Mould. Derrell Lolling, M.D., [facility] General and Minimally invasive Surgery Breast and Colorectal Surgery Office:   [phone] Pager:   [phone]

## 2013-03-01 NOTE — Progress Notes (Signed)
Patient ID: [name] [name], female   DOB: 1940/11/22, 72 y.o.   MRN: [id number] 3 Days Post-Op  Subjective: Cant keep medicines down, feeling dehydrated, nauseated with any PO intake, wants to go home  Objective: Vital signs in last 24 hours: Temp:  [97.8 F (36.6 C)-99.1 F (37.3 C)] 99.1 F (37.3 C) (07/28 0722) Pulse Rate:  [102-120] 110 (07/28 0722) Resp:  [16-18] 16 (07/28 0722) BP: (113-147)/(51-74) 118/51 mmHg (07/28 0722) SpO2:  [92 %-97 %] 92 % (07/28 0751) Last BM Date: 02/08/13  Intake/Output from previous day: 07/27 0701 - 07/28 0700 In: 5699 [P.O.:320; I.V.:500; Blood:25; WUJ:8119] Out: 3350 [Urine:250; Drains:3100] Intake/Output this shift:    General appearance: alert, cooperative and no distress GI: soft, appropriate tenderness, incision looks okay, output: clear gastric, no peritoneal signs  Lab Results:   Recent Labs  02/27/13 0843 02/28/13 0832  WBC 7.5 7.0  HGB 7.9* 8.6*  HCT 24.7* 26.2*  PLT 398 360   BMET  Recent Labs  02/27/13 0843 02/28/13 0832  NA 134* 136  K 3.8 3.1*  CL 95* 93*  CO2 33* 37*  GLUCOSE 83 118*  BUN 20 21  CREATININE 0.56 0.45*  CALCIUM 8.5 8.6   PT/INR No results found for this basename: LABPROT, INR,  in the last 72 hours ABG No results found for this basename: PHART, PCO2, PO2, HCO3,  in the last 72 hours  Studies/Results: No results found.  Anti-infectives: Anti-infectives   Start     Dose/Rate Route Frequency Ordered Stop   02/26/13 1230  cefOXitin (MEFOXIN) 2 g in dextrose 5 % 50 mL IVPB     2 g 100 mL/hr over 30 Minutes Intravenous  Once 02/26/13 1223 02/26/13 1329      Assessment/Plan: POD#3-Exp Lap with upper endoscopy to verifiy anatomy, placement of Moss gastrostomy tube: unable to tolerate PO meds or PO intake, family wants to know if tube would be usable, patient does not want to stay in hospital if possible and wants to go home, also worried about dehydration, will discuss situation with Dr.  Derrell Lolling     LOS: 11 days    Gleen Ripberger, [facility] 03/01/2013

## 2013-03-02 LAB — GLUCOSE, CAPILLARY
Glucose-Capillary: 203 mg/dL — ABNORMAL HIGH (ref 70–99)
Glucose-Capillary: 122 mg/dL — ABNORMAL HIGH (ref 70–99)

## 2013-03-02 LAB — BASIC METABOLIC PANEL
BUN: 27 mg/dL — ABNORMAL HIGH (ref 6–23)
CO2: >45 mEq/L (ref 19–32)
Calcium: 8.5 mg/dL (ref 8.4–10.5)
Chloride: 89 mEq/L — ABNORMAL LOW (ref 96–112)
Creatinine, Ser: 0.55 mg/dL (ref 0.50–1.10)
GFR calc Af Amer: >90 mL/min (ref >90)
GFR calc non Af Amer: >90 mL/min (ref >90)
Glucose, Bld: 120 mg/dL — ABNORMAL HIGH (ref 70–99)
Glucose, Bld: 154 mg/dL — ABNORMAL HIGH (ref 70–99)
Sodium: 139 mEq/L (ref 135–145)

## 2013-03-02 MED ORDER — MORPHINE SULFATE (CONCENTRATE) 10 MG /0.5 ML PO SOLN: 10.0000 mg | Freq: Once | ORAL | Status: DC

## 2013-03-02 MED ORDER — SODIUM CHLORIDE 0.9 % IV BOLUS (SEPSIS)
500.0000 mL | Freq: Once | INTRAVENOUS | Status: AC
  Administered 2013-03-02: 500 mL via INTRAVENOUS

## 2013-03-02 MED ORDER — ONDANSETRON 4 MG PO TBDP: 4.0000 mg | ORAL_TABLET | Freq: Three times a day (TID) | ORAL | Status: DC | PRN

## 2013-03-02 MED ORDER — HEPARIN SOD (PORK) LOCK FLUSH 100 UNIT/ML IV SOLN: 500.0000 [IU] | INTRAVENOUS | Status: DC | PRN

## 2013-03-02 MED ORDER — PROCHLORPERAZINE 25 MG RE SUPP: 25.0000 mg | Freq: Three times a day (TID) | RECTAL | Status: DC | PRN

## 2013-03-02 MED ORDER — POTASSIUM CHLORIDE 10 MEQ/100ML IV SOLN
10.0000 meq | INTRAVENOUS | Status: AC
  Administered 2013-03-02 (×5): 10 meq via INTRAVENOUS
  Filled 2013-03-02 (×5): qty 100

## 2013-03-02 MED ORDER — LORAZEPAM 2 MG/ML PO CONC: 1.0000 mg | Freq: Three times a day (TID) | ORAL | Status: AC

## 2013-03-02 MED ORDER — FENTANYL 12 MCG/HR TD PT72: 1.0000 | MEDICATED_PATCH | TRANSDERMAL | Status: DC

## 2013-03-02 MED ORDER — POTASSIUM CHLORIDE 10 MEQ/100ML IV SOLN
10.0000 meq | INTRAVENOUS | Status: DC
  Filled 2013-03-02 (×4): qty 100

## 2013-03-02 NOTE — Progress Notes (Signed)
Flushed with 10cc NS followed by Heparin 500 units to dwell in port.  Port will remain accessed during patients discharge per MD orders. [name] [name]

## 2013-03-02 NOTE — Progress Notes (Addendum)
CRITICAL VALUE ALERT  Critical value received:  k 2.4 & CO2 >45  Date of notification:  03/02/13  Time of notification:  0518  Critical value read back:yes  Nurse who received alert:  Oneita Jolly, RN  MD notified (1st page):  Dr. Trina Ao   Time of first page:  0520  MD notified (2nd page):  Time of second page:  Responding MD:  Dr. Trina Ao, orders for potassium replacement given.  Time MD responded:  [phone]

## 2013-03-02 NOTE — Progress Notes (Signed)
PARENTERAL NUTRITION CONSULT NOTE   Pharmacy Consult for TNA Indication: SBO/Malnutrition   Allergies  Allergen Reactions  . Promethazine     "Makes me feel like I want to jump out of my skin"    Patient Measurements: Height: 5\' 4"  (162.6 cm) Weight: 140 lb 9.6 oz (63.776 kg) IBW/kg (Calculated) : 54.7 Usual Weight:122 lb  Vital Signs: Temp: 99.1 F (37.3 C) (07/29 0540) Temp src: Oral (07/29 0540) BP: 126/57 mmHg (07/29 0540) Pulse Rate: 105 (07/29 0540) Intake/Output from previous day: 07/28 0701 - 07/29 0700 In: 2628.6 [ZOX:0960.4] Out: 5250 [Urine:100; Drains:5150]  Labs:  Recent Labs  02/27/13 0843 02/28/13 0832 03/01/13 0830  WBC 7.5 7.0 8.0  HGB 7.9* 8.6* 11.1*  HCT 24.7* 26.2* 33.5*  PLT 398 360 320     Recent Labs  02/27/13 0843 02/28/13 0832 03/01/13 0830 03/02/13 0445  NA 134* 136 143 141  K 3.8 3.1* 2.6* 2.4*  CL 95* 93* 92* 90*  CO2 33* 37* 44* >45*  GLUCOSE 83 118* 129* 120*  BUN 20 21 25* 27*  CREATININE 0.56 0.45* 0.51 0.55  CALCIUM 8.5 8.6 9.4 9.2  MG  --  1.5 1.7  --   PHOS  --  3.9 4.7* 4.8*  PROT 5.6*  --  6.3  --   ALBUMIN 1.8*  --  2.2*  --   AST 29  --  18  --   ALT 21  --  16  --   ALKPHOS 110  --  116  --   BILITOT 0.2*  --  0.3  --   PREALBUMIN  --   --  8.1*  --   TRIG  --   --  131  --    Estimated Creatinine Clearance: 57.3 ml/min (by C-G formula based on Cr of 0.55).    Recent Labs  03/01/13 0754 03/01/13 1141 03/01/13 1655  GLUCAP 140* 185* 122*     Medications:  Scheduled:  . albuterol  2.5 mg Nebulization QID   And  . ipratropium  0.5 mg Nebulization QID  . budesonide-formoterol  2 puff Inhalation BID  . buPROPion  150 mg Oral BID  . busPIRone  15 mg Oral BID  . fentaNYL  12.5 mcg Transdermal Q72H  . insulin aspart  0-15 Units Subcutaneous Custom  . LORazepam  1 mg Oral TID PC & HS  . morphine CONCENTRATE  10 mg Oral Once  . pantoprazole sodium  40 mg Oral QPC supper  . potassium chloride  10  mEq Intravenous Q1 Hr x 4  . sertraline  100 mg Oral QAC breakfast  . sodium chloride  500 mL Intravenous Once   Infusions:  . Marland KitchenTPN (CLINIMIX-E) Adult 50 mL/hr at 03/02/13 0616   And  . fat emulsion 250 mL (03/01/13 1713)  . sodium chloride Stopped (02/26/13 1809)   Home Insulin Requirements :   Patient adds 30 units regular insulin to TNA each night.  The final volume of her home TNA, including overfill, is 1780 mL, for an insulin concentration of 16.9 units/L  Home TNA Regimen:   Home TNA per [facility] had been infused daily over 12 hours as follows: 40ml/hr x 1 hr, then 158 ml/hr x 10 hr, then 79ml/hr x1 hr, then off x 10 hr.  This formula provided 85 grams protein/day, 1809 KCal/day.  Staff attempted to use TNA from home as ordered 7/17 but problems were reportedly encountered with the filter and the bag could  not be used.    Please note that current [facility] policy requires hospital inpatients to have TNA supplied by the inpatient pharmacy rather than using home supply.  Gastric tube placement on 02/26/2013  Inpatient insulin requirements:  10 units SSI documented in past 24 hours 40 units insulin/2L bag TNA, Based on  Cyclic rate patient is receiving 33.4 units of inuslin while TNA is infusing.   Estimated Nutritional Needs per RD (7/18): Kcal: 1630-1860, Protein: 80-90 grams, Fluid: 1.7 -2L daily  Current Nutrition: NPO Clinimix E 5/20 + insulin 20 units/L (40 units/2 L bag) cycled over 14 hours.  50 mL/hr x 1 hr, 131 mL/hr x 12 hr, 50 mL/hr x 1 hr, off x 10 hr Fat Emulsion 20%, 18 mL/hr x 14 hr, off x 10 hr Multivitamins and trace elements daily. This regimen provides 84 grams protein/day, 1950 KCal/day  Maintenance IVF: NS at [facility]  Assessment: 72 yo F with metastatic ovarian cancer, on home TNA for SBO, admitted with uncontrolled nausea and vomiting - now has NG tube in place.  Needs continued TNA support.    Labs  CBGs: Within goal range while TNA off,   Increased to 203 while TNA running.   Electrolytes - Potassium (2.4) remains low despite potassium 50 mEq given yesterday.  Phosphorus also trending up (4.8) today, will repeat in AM  LFTs - WNL/stable  TGs - WNL  Prealbumin - trending down -  17.6 (7/19), 12.8 (7/21); 8.1 (7/28)  I/O:  Drains = 3100 ml output last 24hr Wt:  Up from 56.2 kg on 7/17 to 59.3 kg on 7/23; 58.5kg on 7/25  Plan:   Continue current cyclic TNA regimen: Clinimix E 5/20 + insulin 20 units/L (40 units/2 L bag) cycled over 14 hours.  50 mL/hr x 1 hr, 131 mL/hr x 12 hr, 50 mL/hr x 1 hr, off x 10 hr.  Fat Emulsion 20%, 18 mL/hr x 14 hr, off x 10 hr  Orders to replace K this morning by MD (total of 40 mEq)  - will add 2 more runs of potassium  Repeat BMET and phos in AM  MVI and trace elements daily  Routine TNA labs Mondays and Thursdays   CBGs above goal of < 150 while TNA is running, However given CBG's within goal while TNA off feel comfortable with any reading < 200 to avoid hypoglycemia. Will continue current SSI insulin regimen   Dr. Darnelle Catalan: I placed a copy of the patients Home TNA regimen in her shadow chart with the electrolyte contents highlighted - These are adjusted per the pharmacist at [facility].  - The Home TNA prior to admission contained 20 mEq of potassium per day.   BorgerdingLoma Messing PharmD Pager #: [phone]  7:42 AM 03/02/2013

## 2013-03-02 NOTE — Progress Notes (Signed)
Patient ID: [name] [name], female   DOB: 10-29-1940, 72 y.o.   MRN: [id number] 4 Days Post-Op  Subjective: Doing better today, less pain and nausea, wants to go home  Objective: Vital signs in last 24 hours: Temp:  [98.5 F (36.9 C)-99.4 F (37.4 C)] 99.1 F (37.3 C) (07/29 0540) Pulse Rate:  [98-105] 105 (07/29 0540) Resp:  [16-18] 16 (07/29 0540) BP: (119-142)/(57-69) 126/57 mmHg (07/29 0540) SpO2:  [92 %-98 %] 98 % (07/29 0755) Weight:  [140 lb 9.6 oz (63.776 kg)] 140 lb 9.6 oz (63.776 kg) (07/29 0540) Last BM Date: 02/08/13  Intake/Output from previous day: 07/28 0701 - 07/29 0700 In: 2628.6 [NWG:9562.1] Out: 5250 [Urine:100; Drains:5150] Intake/Output this shift:    General appearance: alert, cooperative and no distress GI: soft, appropriate tenderness, incision looks okay with staples, output: clear gastric, no peritoneal signs  Lab Results:   Recent Labs  02/28/13 0832 03/01/13 0830  WBC 7.0 8.0  HGB 8.6* 11.1*  HCT 26.2* 33.5*  PLT 360 320   BMET  Recent Labs  03/01/13 0830 03/02/13 0445  NA 143 141  K 2.6* 2.4*  CL 92* 90*  CO2 44* >45*  GLUCOSE 129* 120*  BUN 25* 27*  CREATININE 0.51 0.55  CALCIUM 9.4 9.2   PT/INR No results found for this basename: LABPROT, INR,  in the last 72 hours ABG No results found for this basename: PHART, PCO2, PO2, HCO3,  in the last 72 hours  Studies/Results: No results found.  Anti-infectives: Anti-infectives   Start     Dose/Rate Route Frequency Ordered Stop   02/26/13 1230  cefOXitin (MEFOXIN) 2 g in dextrose 5 % 50 mL IVPB     2 g 100 mL/hr over 30 Minutes Intravenous  Once 02/26/13 1223 02/26/13 1329      Assessment/Plan: POD#4-Exp Lap with upper endoscopy to verifiy anatomy, placement of Moss gastrostomy tube: ok to discharge home from surgery standpoint, needs to follow up in our clinic late this week for staple removal, keep g-tube to gravity drain and flush bid with 10-15ccs saline.     LOS: 12  days    [name], [name] 03/02/2013

## 2013-03-02 NOTE — Progress Notes (Signed)
I have interviewed and examined Ms. Weissmann today. She wants to go home and she seems stable to do so. Her wounds look fine. There is slight erythema at the lower end of the midline incision. She will return to our office in approximately one week for wound check and staple removal.  Angelia Mould. Derrell Lolling, M.D., [facility] General and Minimally invasive Surgery Breast and Colorectal Surgery Office:   [phone] Pager:   [phone]

## 2013-03-02 NOTE — Progress Notes (Signed)
All HH arrangements have been completed with [facility].   Algernon Huxley RN BSN  [phone]

## 2013-03-02 NOTE — Discharge Instructions (Signed)
Keep G-Tube to gravity drain Flush tube with 10-15cc's of saline twice daily Call our office to get your appt day and time for staple removal.

## 2013-03-02 NOTE — Progress Notes (Signed)
[facility]  Patient Status: Active (receiving services up to time of hospitalization)  [facility] is providing the following services: RN and Home Infusion Services (teaching and education will be done by nurse in the home with patient and caregiver)  [facility] is prepared for DC home today to support patients home care needs.    If patient discharges after hours, please call [phone].   [name] [name] 03/02/2013, 10:12 AM

## 2013-03-03 ENCOUNTER — Other Ambulatory Visit: Payer: Self-pay | Admitting: *Deleted

## 2013-03-03 MED ORDER — MORPHINE SULFATE (CONCENTRATE) 10 MG /0.5 ML PO SOLN: 10.0000 mg | ORAL | Status: AC | PRN

## 2013-03-05 ENCOUNTER — Telehealth: Payer: Self-pay | Admitting: *Deleted

## 2013-03-05 NOTE — Telephone Encounter (Signed)
This RN received call from pt stating concerns with G tube. [name] [name] states she is having discomfort at site G tube.  She also states " it has a lot of drainage- at least 3 liters a day, but then I am so thirsty that I drink a lot of ice water and then if you don't drain it quickly I get nauseated ".  [name] also states " I am so hungry too ".  Per discussion this RN explained reason for G tube was to assist with SBO and nausea, the more she drinks the more it drains due to the blockage.  Site is dressed that pt cannot state assessment of site. She states [facility] was out Wednesday to re-establish services.  This RN informed pt [facility] would be notified per above.   Spoke with Erline Levine per above concerns with request for RN visit for assessment as well as noted concern with pt's statement of hunger and excessive thirst while on TPN. [name] will send a prn visit by RN to home either this evening or in am and notify pharmacy as well of pt's complaints.

## 2013-03-06 NOTE — Discharge Summary (Signed)
Physician Discharge Summary  Patient ID: [name] [name] MRN: [id number] DOB/AGE: Sep 04, 1940 72 y.o.  Admit date: 02/18/2013 Discharge date: 03/06/2013  Primary Care Physician:  Laurette Schimke, MD   Discharge Diagnoses:  Persistent SBO, and aea, severe malnutritionpain, naus  Present on Admission:  . Abdominal distension . Abdominal pain, unspecified site . Constipation . COPD . Depression . DM w/o Complication Type II . DYSLIPIDEMIA . GERD . Nausea and vomiting . Omental mass . Ovarian cancer . Partial small bowel obstruction  Discharge Medications:    Medication List    STOP taking these medications       docusate sodium 100 MG capsule  Commonly known as:  COLACE     polyethylene glycol packet  Commonly known as:  MIRALAX / GLYCOLAX      TAKE these medications       albuterol (2.5 MG/3ML) 0.083% nebulizer solution  Commonly known as:  PROVENTIL  Take 2.5 mg by nebulization every 6 (six) hours as needed for wheezing.     ALPRAZolam 0.5 MG tablet  Commonly known as:  XANAX  TAKE ONE TABLET TWICE A DAY PRN ANXIETY OR IF NEEDED FOR SLEEP     atenolol 25 MG tablet  Commonly known as:  TENORMIN  Take 12.5 mg by mouth 2 (two) times daily.     budesonide-formoterol 160-4.5 MCG/ACT inhaler  Commonly known as:  SYMBICORT  Inhale 2 puffs into the lungs 2 (two) times daily.     buPROPion 150 MG 12 hr tablet  Commonly known as:  WELLBUTRIN SR  Take 1 tablet (150 mg total) by mouth 2 (two) times daily.     busPIRone 15 MG tablet  Commonly known as:  BUSPAR  Take 1 tablet (15 mg total) by mouth 2 (two) times daily.     clopidogrel 75 MG tablet  Commonly known as:  PLAVIX  Take 1 tablet (75 mg total) by mouth daily with breakfast.     COMBIVENT RESPIMAT 20-100 MCG/ACT Aers respimat  Generic drug:  Ipratropium-Albuterol  Inhale 1 puff into the lungs every 6 (six) hours as needed for wheezing or shortness of breath.     dexamethasone 4 MG tablet   Commonly known as:  DECADRON  2 tablets by mouth twice daily for 3 days after each chemo     diazepam 5 MG tablet  Commonly known as:  VALIUM  Take 5-10 mg by mouth at bedtime as needed for sleep.     fentaNYL 12 MCG/HR  Commonly known as:  DURAGESIC - dosed mcg/hr  Place 1 patch (12.5 mcg total) onto the skin every 3 (three) days.     LORazepam 2 MG/ML concentrated solution  Commonly known as:  ATIVAN  Take 0.5 mLs (1 mg total) by mouth 4 (four) times daily - after meals and at bedtime.     lovastatin 40 MG tablet  Commonly known as:  MEVACOR  Take 1 tablet (40 mg total) by mouth daily.     omeprazole 40 MG capsule  Commonly known as:  PRILOSEC  Take 1 capsule (40 mg total) by mouth daily.     ondansetron 4 MG disintegrating tablet  Commonly known as:  ZOFRAN-ODT  Take 1 tablet (4 mg total) by mouth every 8 (eight) hours as needed.     ONE TOUCH ULTRA TEST test strip  Generic drug:  glucose blood  USE AS DIRECTED     oxyCODONE-acetaminophen 5-325 MG per tablet  Commonly known as:  PERCOCET/ROXICET  Take 1 tablet by mouth every 6 (six) hours as needed for pain.     PRESCRIPTION MEDICATION  at bedtime. Home Paranatal Nutrition from [facility].     prochlorperazine 25 MG suppository  Commonly known as:  COMPAZINE  Place 1 suppository (25 mg total) rectally every 8 (eight) hours as needed.     ranitidine 150 MG tablet  Commonly known as:  ZANTAC  Take 1 tablet (150 mg total) by mouth 2 (two) times daily.     sertraline 100 MG tablet  Commonly known as:  ZOLOFT  Take 1 tablet (100 mg total) by mouth daily before breakfast.     UNABLE TO FIND  Inject 30 Units into the vein Nightly. Med Name:  INSULIN IN TPN NIGHTLY     ZOFRAN 4 MG/2ML Soln injection  Generic drug:  ondansetron  Inject 4 mg into the vein at bedtime. Pt gets through her port.         Disposition and Follow-up: Discharged to home with followup of the cancer clinic  03/09/2013  Significant Diagnostic Studies:  Dg Abd 1 View  02/25/2013   *RADIOLOGY REPORT*  Clinical Data: History of small-bowel obstruction.  Distention of abdomen.  Follow-up.  ABDOMEN - 1 VIEW  Comparison: 02/24/2013.  Findings: Tip of enteric tube is in the proximal area of the stomach and could be advanced.  There is continued dilatation of loops of small intestine in the abdomen.  The largest loop has a greatest caliber of 5 cm which is slightly larger than on previous study.  There is some colonic gas present.  No opaque calculi are seen.  Pelvic phlebolith is present.  Surgical clip is seen in the pelvis.  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  IMPRESSION: Tip of enteric tube is in the proximal area of the stomach and could be advanced.  There is continued dilatation of loops of small intestine in the abdomen.  The largest loop has a greatest caliber of 5 cm which is slightly larger than on previous study.   Original Report Authenticated By: Onalee Hua Call   Dg Abd 1 View  02/24/2013   *RADIOLOGY REPORT*  Clinical Data: Small bowel obstruction.  ABDOMEN - 1 VIEW  Comparison: 7/21, 7/19, 7/17, and 01/07/2013  Findings: Tip of the NG tube is in the fundus of the stomach and could be advanced.  The dilated small bowel in the mid abdomen and pelvis is slightly less prominent.  The colon is not distended.  There is evidence of ascites.  No acute osseous abnormality.  No visible free air on this supine radiograph.  IMPRESSION:  1.  Slight decreased distention of small bowel in the mid abdomen. 2.  NG tube tip is in the fundus of the stomach and could be advanced 10-15 cm.   Original Report Authenticated By: Francene Boyers, M.D.   Dg Abd 1 View  02/22/2013   **ADDENDUM** CREATED: 02/22/2013 18:01:07  For position of the nasogastric tube within the stomach, tube could be advanced approximately 10 cm.  **END ADDENDUM** SIGNED BY: Blair Hailey. Manson Passey, M.D.  02/22/2013   *RADIOLOGY REPORT*  Clinical Data:  Abdominal pain, small bowel obstruction  ABDOMEN - 1 VIEW  Comparison: 02/19/2013  Findings: The nasogastric catheter is withdrawn somewhat and now lies within the distal esophagus.  There remain dilated loops of small bowel centrally within the abdomen. The maximum dimension is approximate 4.6 cm.  Stomach appears mildly decompressed.  Minimal colonic gas is noted.  IMPRESSION:  Persistent small bowel obstruction.  Nasogastric catheter now lies within the distal esophagus.   Original Report Authenticated By: Alcide Clever, M.D.   Dg Abd 1 View  02/19/2013   *RADIOLOGY REPORT*  Clinical Data: Small bowel obstruction  ABDOMEN - 1 VIEW  Comparison: 02/18/2013  Findings: NG tube in the proximal stomach.  Stomach is decompressed.  Dilated loops of small bowel remain in the mid abdomen with a diameter of 4.3 cm.  Findings compatible with persistent small bowel obstruction.  Colon is decompressed.  IMPRESSION:  No significant change in S B O pattern.   Original Report Authenticated By: Judie Petit. Miles Costain, M.D.   Dg Abd 2 Views  02/18/2013   *RADIOLOGY REPORT*  Clinical Data: Ovarian cancer.  Abdominal pain with nausea and vomiting  ABDOMEN - 2 VIEW  Comparison: CT 01/07/2013  Findings: Small bowel is mildly distended and contains air fluid levels suggesting small bowel obstruction.  No free air.  Colon is decompressed.  IMPRESSION: Findings consistent with small bowel obstruction.   Original Report Authenticated By: Janeece Riggers, M.D.    Discharge Laboratory Values: Basic Metabolic Panel:  Recent Labs Lab 02/28/13 0832 03/01/13 0830 03/02/13 0445 03/02/13 1000  NA 136 143 141 139  K 3.1* 2.6* 2.4* 2.6*  CL 93* 92* 90* 89*  CO2 37* 44* >45* >45*  GLUCOSE 118* 129* 120* 154*  BUN 21 25* 27* 26*  CREATININE 0.45* 0.51 0.55 0.52  CALCIUM 8.6 9.4 9.2 8.5  MG 1.5 1.7  --   --   PHOS 3.9 4.7* 4.8*  --    GFR Estimated Creatinine Clearance: 57.3 ml/min (by C-G formula based on Cr of 0.52). Liver Function  Tests:  Recent Labs Lab 03/01/13 0830  AST 18  ALT 16  ALKPHOS 116  BILITOT 0.3  PROT 6.3  ALBUMIN 2.2*   No results found for this basename: LIPASE, AMYLASE,  in the last 168 hours No results found for this basename: AMMONIA,  in the last 168 hours Coagulation profile No results found for this basename: INR, PROTIME,  in the last 168 hours  CBC:  Recent Labs Lab 02/28/13 0832 03/01/13 0830  WBC 7.0 8.0  NEUTROABS 4.6 5.0  HGB 8.6* 11.1*  HCT 26.2* 33.5*  MCV 88.8 86.3  PLT 360 320   Cardiac Enzymes: No results found for this basename: CKTOTAL, CKMB, CKMBINDEX, TROPONINI,  in the last 168 hours BNP: No components found with this basename: POCBNP,  CBG:  Recent Labs Lab 03/01/13 1141 03/01/13 1655 03/01/13 2133 03/02/13 0450 03/02/13 1011  GLUCAP 185* 122* 203* 122* 151*   D-Dimer No results found for this basename: DDIMER,  in the last 72 hours Hgb A1c No results found for this basename: HGBA1C,  in the last 72 hours Lipid Profile No results found for this basename: CHOL, HDL, LDLCALC, TRIG, CHOLHDL, LDLDIRECT,  in the last 72 hours Thyroid function studies No results found for this basename: TSH, T4TOTAL, FREET3, T3FREE, THYROIDAB,  in the last 72 hours Anemia work up No results found for this basename: VITAMINB12, FOLATE, FERRITIN, TIBC, IRON, RETICCTPCT,  in the last 72 hours Microbiology Recent Results (from the past 240 hour(s))  SURGICAL PCR SCREEN     Status: None   Collection Time    02/26/13 11:59 AM      Result Value Range Status   MRSA, PCR NEGATIVE  NEGATIVE Final   Staphylococcus aureus NEGATIVE  NEGATIVE Final   Comment:  The Xpert SA Assay (FDA     approved for NASAL specimens     in patients over 95 years of age),     is one component of     a comprehensive surveillance     program.  Test performance has     been validated by [employer] for patients greater     than or equal to 84 year old.     It is not intended      to diagnose infection nor to     guide or monitor treatment.     Brief H and P: For complete details please refer to admission H and P, but in brief, the patient was admitted with worsening symptoms secondary to small bowel obstruction. For additional details see hospital course Physical Exam at Discharge: BP 121/73  Pulse 102  Temp(Src) 98.9 F (37.2 C) (Oral)  Resp 18  Ht 5\' 4"  (1.626 m)  Wt 140 lb 9.6 oz (63.776 kg)  BMI 24.12 kg/m2  SpO2 94% Gen: Middle-aged white woman who appears chronically ill Cardiovascular: Regular rate and rhythm Respiratory: The patient is wearing an oxygen cannula. There are no rales rhonchi or wheezes appreciated Gastrointestinal: Abdomen is slightly distended. There is no fluid wave. Bowel sounds are absent Extremities: No peripheral edema    Hospital Course:  Active Problems:   Ovarian cancer   DM w/o Complication Type II   DYSLIPIDEMIA   COPD   GERD   Depression   Abdominal distension   Abdominal pain, unspecified site   Omental mass   Constipation   Partial small bowel obstruction   Nausea and vomiting 72 y.o. 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), admitted with recurrent SBO  (1) status post left modified radical mastectomy October 1993 for a T2 N0 invasive ductal breast, estrogen and progesterone receptor positive cancer treated adjuvantly with MFL (methotrexate/fluorouracil/leucovorin) chemotherapy x6, completed April 1994, followed by 5 years of tamoxifen completed April 1999, with no evidence of recurrence  (2) now status post TAH BSO, omentectomy, and suboptimal debulking 10/29/2011 for a high-grade serous adenocarcinoma of the ovary, pT3c NX (Stage IIIC) , s/p eight cycles of carboplatin/paclitaxel completed 05/05/2012 (initial CA125 of 2050.7 normalized after cycle 5)  (3) recurrent disease documented by rising CEA 125 an abdominal CT scan 09/21/2012.  (4) COPD  with ongoing tobacco abuse  (5) diabetes mellitus  (6) genetic testing for Lynch syndrome pending.  (7) status post hospitalization in July 2013 for stroke/small acute infarcts in the left sub insular white matter and coronal radiata.  (8) port in place  (9) left upper lobe 6 mm subpleural nodule, without associated hypermetabolic activity (likely well below the resolution of PET imaging) noted August 2013  (10) CTs obtained in [place] 2014 showed evidence of significant progression of disease with widespread intraperitoneal metastases and a moderate volume of presumably malignant ascites.  (11) treated with Doxil on a Q. three-week basis, first dose given on 10/06/2012, discontinued after 5 cycles, last dose given on 12/29/2012, secondary to hand/foot syndrome and skin changes.  (12) cholecystitis, with cholecystectomy pending  (13) Hx of recurring ascites, requiring therapeutic paracentesis and Aspira drain, removed on 01/15/13  (14) Malnutrition, on TPN. Apparent infiltration of TPN noted on 12/15/12, resolved  (15) Hx of small bowel obstruction requiring hospitalization, 01/06/2013 - 01/08/2013  (16) carboplatin to be given every 21 days, first dose on 01/26/2013   Is and  she'll with no improvement in maximum support was continued, with nightly TNA, hydration, and electrolyte correction. Her pain was treated with  Parenteral narcotics.after a week with no improvement in her symptoms, interventional radiology was consulted for the possibility of a PEG tube. They did not feel this could be done radiological A., but suggested an open attempt. We then consulted Dr. Luretha Murphy, and he was able to do a laparotomy and place a PEG tube as described in his note. The patient tolerated the procedure well. She and her family were trained in the next 48 hours on drainage and general care of the PEG tube. The plan is to continue her out patient chemotherapy as before, since there continues to be a significant  drop in her CEA 125.     Diet:   only clear liquids, small steps at a time.  Activity:   as tolerated   Condition at Discharge:   Improved   Signed: Dr. Ruthann Cancer [phone]  03/06/2013, 2:55 PM

## 2013-03-06 NOTE — H&P (Signed)
ID: [name] [name]   DOB: 03-17-1941  MR#: [id number]  CSN#[id number]  PCP:   GYN: [name] Schimke, MD SU: [name] OTHER MD:   HISTORY OF PRESENT ILLNESS: [name] [name] is a 72 year-old [place] woman I followed remotely for a history of breast cancer. More recently she noted that her belly was getting "bigger". Gradually she has developed increasing low back pain. After a period of several months, as her symptoms increased, she brought this problem to her primary physician's attention and a KUB was obtained on 10/07/2011, which was unremarkable. This was followed by a CT scan 10/15/2011, which showed ascites and omental caking. Paracentesis 10/18/2011 showed ([id number]) malignant cells consistent with metastatic adenocarcinoma, with equivocal staining for estrogen receptor. The pattern of additional stains was most suggestive of a primary gynecologic tumor. CA 125 at this time was 2051.   The patient was referred to gynecologic oncology and on 10/29/2011 underwent exploratory laparotomy under Drs. Cleda Mccreedy and Antionette Char. This consisted of a total abdominal hysterectomy with bilateral salpingo-oophorectomy, omentectomy, and radical tumor debulking. Unfortunately there was significant tumor attached to the diaphragm so the patient was suboptimally debulked. Accordingly a peritoneal catheter was not placed. GYN-ONC recommended 6 cycles of carboplatin/paclitaxel given every 3 weeks, with Neulasta on day 2 for granulocyte support. [name] [name]'s subsequent history is as detailed below.   INTERVAL HISTORY:  [name] [name] was evaluated in the clinic today accompanied by her husband [name] for complaints regarding progressive problems with nausea, anorexia, constipation, and pain. Today is day 9 cycle 5 of liposomal doxorubicin.    REVIEW OF SYSTEMS:   [name] [name] tells me she is having "small hard balls" as her only bowel movements. She has a lot of gas and belching. She has a drainage catheter in  her abdomen, but this has drained less than 100 cc in the last week and she does not have problems with abdominal distention at this point she is nausea, but has not been vomiting. On the other hand she also has not been eating she has no breakfast today and no supper last night she is drinking some fluids but not much. She is minimally active, doing a little walking around the house and yard, but mostly sitting around and watching TV. Sometimes her legs feel weak, but they have not "given way". There has not been any bleeding on the Xarelto. There has not been any unusual headaches, changes in her vision, dizziness, gait imbalance, or falls. She has some peeling and a little [name] in the palms of her hands, not in her feet. She did not develop mouth sores with the chemotherapy. She denies fever. A detailed review of systems is otherwise noncontributory.   PAST MEDICAL HISTORY: Past Medical History   Diagnosis  Date   .  History of breast cancer     .  COPD (chronic obstructive pulmonary disease)     .  GERD (gastroesophageal reflux disease)     .  Diabetes mellitus         type II   .  Hx of colonic polyps     .  Osteoarthritis     .  MVP (mitral valve prolapse)     .  Dysrhythmia         pt states runs a little fast    .  Shortness of breath         with exertion    .  Depression     .  Anxiety     .  Heart murmur     .  Cancer         left breast   .  Ovarian cancer         active chemotherapy   .  Stroke     .  Ascites  10/22/2012    PAST SURGICAL HISTORY: Past Surgical History   Procedure  Laterality  Date   .  Cesarean section    1980, 1982       X 2    .  Laparoscopy           work up for infertility   .  Tympanoplasty           left X 2   .  Left breast fibroadenoma removal    1960's   .  Breast cyst aspirations       .  Left modified radial mastectomy       .  Laparotomy    10/29/2011       Procedure: EXPLORATORY LAPAROTOMY;  Surgeon: Rejeana Brock A.  Duard Brady, MD;  Location: [facility];  Service: Gynecology;  Laterality: N/A;   .  Abdominal hysterectomy    10/29/2011       Procedure: HYSTERECTOMY ABDOMINAL;  Surgeon: Rejeana Brock A. Duard Brady, MD;  Location: [facility];  Service: Gynecology;  Laterality: N/A;  Total abdominal hysterectomy   .  Salpingoophorectomy    10/29/2011       Procedure: SALPINGO OOPHERECTOMY;  Surgeon: Rejeana Brock A. Duard Brady, MD;  Location: [facility]; Service: Gynecology;  Laterality: Bilateral;    FAMILY HISTORY Family History   Problem  Relation  Age of Onset   .  Cancer  Mother         endometrial/ colon   .  Cancer  Brother         colon   .  COPD  Other     .  Hypertension  Other     .  Cancer  Maternal Uncle         prostate/bladder/lung   .  Cancer  Paternal Aunt         unknown abdominal cancer   .  Cancer  Maternal Grandmother         Salivary gland cancer   .  Cancer  Paternal Aunt         leukemia     the patient's father died at the age of 51. The patient's mother died at the age of 79; she had a history of colon cancer and endometrial cancer. The patient has no sisters. She has one brother, with a history of colon cancer. There is no history of breast cancer in the family. The patient is scheduled for genetic testing later this month.   GYNECOLOGIC HISTORY: She had menarche age 36. First pregnancy to term was at age 85. She is GX P2. She became menopausal at the time of her chemotherapy for breast cancer. This was age 34. 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 Her husband [name] runs a Psychologist, sport and exercise out of their own home. Daughter [name], 75,  currently lives at home with the patient. There are some issues relating to this daughter that the patient discussed briefly. Daughter. [name], 30, is an ED nurse at [employer]. The patient has no grandchildren. She is not a Advice worker.              ADVANCED DIRECTIVES: in place; the  patient has made it  very clear that in case of a terminal event she does not want resuscitation or life support measures   HEALTH MAINTENANCE: History   Substance Use Topics   .  Smoking status:  Current Some Day Smoker -- 0.50 packs/day for 30 years       Types:  Cigarettes   .  Smokeless tobacco:  Never Used         Comment: quit again 2009; restarted 1 ppd 2011   .  Alcohol Use:  No                Colonoscopy: 2009             PAP: s/p hysterectomy             Bone density: 2010. "good"             Mammography: 2012 NOV--unremarkable    Allergies   Allergen  Reactions   .  Promethazine         "Makes me feel like I want to jump out of my skin"      Current Outpatient Prescriptions   Medication  Sig  Dispense  Refill   .  albuterol-ipratropium (COMBIVENT) 18-103 MCG/ACT inhaler  Inhale 2 puffs into the lungs every 4 (four) hours as needed. For wheezing   14.7 Inhaler   3   .  Alum & Mag Hydroxide-Simeth (MAGIC MOUTHWASH W/LIDOCAINE) SOLN  Take 5 mLs by mouth 4 (four) times daily as needed.   250 mL   1   .  atenolol (TENORMIN) 25 MG tablet  Take 12.5 mg by mouth 2 (two) times daily.         .  budesonide-formoterol (SYMBICORT) 160-4.5 MCG/ACT inhaler  Inhale 2 puffs into the lungs 2 (two) times daily.         Marland Kitchen  buPROPion (WELLBUTRIN SR) 150 MG 12 hr tablet  Take 1 tablet (150 mg total) by mouth 2 (two) times daily.   180 tablet   3   .  busPIRone (BUSPAR) 15 MG tablet  Take 1 tablet (15 mg total) by mouth 2 (two) times daily.   180 tablet   2   .  clopidogrel (PLAVIX) 75 MG tablet  Take 1 tablet (75 mg total) by mouth daily with breakfast.   60 tablet   3   .  COMBIVENT RESPIMAT 20-100 MCG/ACT AERS respimat  INHALE 2 PUFFS BY MOUTH EVERY 4 HOURS ASNEEDED FOR WHEEZING   4 g   5   .  diazepam (VALIUM) 5 MG tablet  TAKE 1 TO 2 TABLETS BY MOUTH EVERY NIGHTAT BEDRIME AS NEEDED FOR INSOMNIA   60 tablet   2   .  lovastatin (MEVACOR) 40 MG tablet  Take 1 tablet (40 mg total) by mouth daily.   90 tablet   2    .  morphine 20 MG/5ML solution  Take 2.5 mLs (10 mg total) by mouth every 3 (three) hours as needed for pain.   100 mL   0   .  omeprazole (PRILOSEC) 40 MG capsule  Take 1 capsule (40 mg total) by mouth daily.   90 capsule   3   .  ondansetron (ZOFRAN) 8 MG tablet  Take 1 tablet (8 mg total) by mouth 2 (two) times daily. Take two times a day starting the day after chemo for 2 days. Then take two times a day as needed for nausea or vomiting.  30 tablet   1   .  ONE TOUCH ULTRA TEST test strip  USE AS DIRECTED   100 each   0   .  oxyCODONE-acetaminophen (PERCOCET/ROXICET) 5-325 MG per tablet  Take 1 tablet by mouth every 6 (six) hours as needed for pain.   100 tablet   0   .  prochlorperazine (COMPAZINE) 10 MG tablet  Take 1 tablet (10 mg total) by mouth every 6 (six) hours as needed (Nausea or vomiting).   30 tablet   1   .  sertraline (ZOLOFT) 100 MG tablet  Take 1 tablet (100 mg total) by mouth daily before breakfast.   90 tablet   3   .  [DISCONTINUED] busPIRone (BUSPAR) 15 MG tablet  Take 1 tablet (15 mg total) by mouth 2 (two) times daily.   180 tablet   1   .  [DISCONTINUED] roflumilast (DALIRESP) 500 MCG TABS tablet  Take 500 mcg by mouth daily.             OBJECTIVE: Older white woman in moderate distress  Filed Vitals:   Filed Vitals:   03/02/13 1358  BP: 121/73  Pulse: 102  Temp: 98.9 F (37.2 C)  Resp: 18    Sclerae unicteric Oropharynx clear, slightly dry   Lungs clear bilaterally to auscultation, no significant dullness to percussion Heart regular rate and rhythm, no murmur appreciated Abdomen soft, nontender, nondistended, positive bowel sounds, Aspira drain is in the lower right abdomen. MSK  no peripheral edema Neuro: nonfocal, well oriented, appropriate affect Breasts: Port is intact in the right upper chest wall, accessed    LAB RESULTS: Basic Metabolic Panel:  Recent Labs Lab 02/28/13 0832 03/01/13 0830 03/02/13 0445 03/02/13 1000  NA 136 143 141 139  K  3.1* 2.6* 2.4* 2.6*  CL 93* 92* 90* 89*  CO2 37* 44* >45* >45*  GLUCOSE 118* 129* 120* 154*  BUN 21 25* 27* 26*  CREATININE 0.45* 0.51 0.55 0.52  CALCIUM 8.6 9.4 9.2 8.5  MG 1.5 1.7  --   --   PHOS 3.9 4.7* 4.8*  --    GFR Estimated Creatinine Clearance: 57.3 ml/min (by C-G formula based on Cr of 0.52). Liver Function Tests:  Recent Labs Lab 03/01/13 0830  AST 18  ALT 16  ALKPHOS 116  BILITOT 0.3  PROT 6.3  ALBUMIN 2.2*   No results found for this basename: LIPASE, AMYLASE,  in the last 168 hours No results found for this basename: AMMONIA,  in the last 168 hours Coagulation profile No results found for this basename: INR, PROTIME,  in the last 168 hours  CBC:  Recent Labs Lab 02/28/13 0832 03/01/13 0830  WBC 7.0 8.0  NEUTROABS 4.6 5.0  HGB 8.6* 11.1*  HCT 26.2* 33.5*  MCV 88.8 86.3  PLT 360 320   Cardiac Enzymes: No results found for this basename: CKTOTAL, CKMB, CKMBINDEX, TROPONINI,  in the last 168 hours BNP: No components found with this basename: POCBNP,  CBG:  Recent Labs Lab 03/01/13 1141 03/01/13 1655 03/01/13 2133 03/02/13 0450 03/02/13 1011  GLUCAP 185* 122* 203* 122* 151*   D-Dimer No results found for this basename: DDIMER,  in the last 72 hours Hgb A1c No results found for this basename: HGBA1C,  in the last 72 hours Lipid Profile No results found for this basename: CHOL, HDL, LDLCALC, TRIG, CHOLHDL, LDLDIRECT,  in the last 72 hours Thyroid function studies No results found for this basename: TSH, T4TOTAL,  FREET3, T3FREE, THYROIDAB,  in the last 72 hours Anemia work up No results found for this basename: VITAMINB12, FOLATE, FERRITIN, TIBC, IRON, RETICCTPCT,  in the last 72 hours Microbiology Recent Results (from the past 240 hour(s))  SURGICAL PCR SCREEN     Status: None   Collection Time    02/26/13 11:59 AM      Result Value Range Status   MRSA, PCR NEGATIVE  NEGATIVE Final   Staphylococcus aureus NEGATIVE  NEGATIVE Final    Comment:            The Xpert SA Assay (FDA     approved for NASAL specimens     in patients over 20 years of age),     is one component of     a comprehensive surveillance     program.  Test performance has     been validated by [employer] for patients greater     than or equal to 93 year old.     It is not intended     to diagnose infection nor to     guide or monitor treatment.      STUDIES: Dg Chest 2 View  12/15/2012   *RADIOLOGY REPORT*  Clinical Data: COPD.  History of left mastectomy.  History of ovarian carcinoma.  CHEST - 2 VIEW  Comparison: PA and lateral chest 09/24/2012.  Findings: Since the prior study, the patient has developed small bilateral pleural effusions, larger on the left.  Lungs are clear. Heart size is normal.  The patient is status post left mastectomy and axillary dissection.  Port-A-Cath again noted.  IMPRESSION: New small bilateral pleural effusions, larger on the left.  No other change.   Original Report Authenticated By: Holley Dexter, M.D.   Dg Abd 1 View  01/06/2013   *RADIOLOGY REPORT*  Clinical Data: Constipation.  ABDOMEN - 1 VIEW  Comparison: CT abdomen and pelvis 11/02/2012 and single view of the abdomen 12/09/2012.  Findings: Drainage catheter in the pelvis is again seen.  There is gaseous distention of loops of small bowel in the pelvis up to 6.0 cm.  Gas and stool are seen in the rectum.  IMPRESSION: Gaseous distention of small bowel could be due to ileus or obstruction.   Original Report Authenticated By: Holley Dexter, M.D.   Dg Abd 1 View  12/09/2012   *RADIOLOGY REPORT*  Clinical Data: Abdominal pain.  Ovarian cancer  ABDOMEN - 1 VIEW  Comparison: 11/07/2012  Findings: Normal bowel gas pattern.  Negative for bowel obstruction.  Peritoneal catheter overlying the upper pelvis is unchanged.  Degenerative changes at L2-3 with lateral subluxation is unchanged. No acute bony lesion.  IMPRESSION: No acute abnormality.   Original Report  Authenticated By: Janeece Riggers, M.D.    Dg Abd 1 View   12/09/2012  *RADIOLOGY REPORT*  Clinical Data: Abdominal pain.  Ovarian cancer  ABDOMEN - 1 VIEW  Comparison: 11/07/2012  Findings: Normal bowel gas pattern.  Negative for bowel obstruction.  Peritoneal catheter overlying the upper pelvis is unchanged.  Degenerative changes at L2-3 with lateral subluxation is unchanged. No acute bony lesion.  IMPRESSION: No acute abnormality.   Original Report Authenticated By: Janeece Riggers, M.D.     ASSESSMENT: 72 y.o. 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 [V323G (1196T>G)] , being admitted with poorly controlled nausea, anorexia, dehydration, and pain in the setting of small bowel obstruction secondary to recurrent ovarian cancer   (  1) status post left modified radical mastectomy October 1993 for a T2 N0 invasive ductal breast, estrogen and progesterone receptor positive cancer treated adjuvantly with MFL (methotrexate/fluorouracil/leucovorin) chemotherapy x6, completed April 1994, followed by 5 years of tamoxifen completed April 1999, with no evidence of recurrence   (2) now status post TAH BSO, omentectomy, and suboptimal debulking 10/29/2011 for a high-grade serous adenocarcinoma of the ovary, pT3c NX (Stage IIIC) , s/p eight cycles of carboplatin/paclitaxel completed 05/05/2012 (initial CA125 of 2050.7 normalized after cycle 5)   (3) recurrent disease documented by rising CEA 125 an abdominal CT scan 09/21/2012.   (4) COPD with ongoing tobacco abuse   (5) diabetes mellitus   (6) genetic testing for Lynch syndrome pending.   (7)  status post hospitalization in July 2013 for stroke/small acute infarcts in the left sub insular white matter and coronal radiata.   (8) port in place   (9) left upper lobe 6 mm subpleural nodule, without associated hypermetabolic activity (likely well below the resolution of PET imaging) noted August 2013   (10)  CTs obtained in February 2014 showed evidence of significant progression of disease with widespread intraperitoneal metastases and a moderate volume of presumably malignant ascites.   (11) currently being treated with Doxil on a Q. three-week basis, first dose given on 10/06/2012, fifth cycle given 12/29/2012   (12) cholecystitis, with elective cholecystectomy pending   (13)  recurring ascites, requiring therapeutic paracentesis, status post pending placement of Aspira drain   (14) history of apparent infiltration of TPN  PLAN:   We are admitting [name] [name] so that we can continue hydration and relieve her nausea and pain. If she is indeed obstructive we will treat conservatively. If possible we will try to obtain a CT of the abdomen with contrast this admission to restage her response to treatment. So I expect the patient to be discharged in a few days, in accordance with her wishes I am writing a DO NOT RESUSCITATE order.             ID: [name] [name]   DOB: October 14, 1940  MR#: [id number]  CSN#[id number]  PCP:   GYN: [name] Schimke, MD SU: [name] OTHER MD:   HISTORY OF PRESENT ILLNESS: [name] [name] is a 72 year-old [place] woman I followed remotely for a history of breast cancer. More recently she noted that her belly was getting "bigger". Gradually she has developed increasing low back pain. After a period of several months, as her symptoms increased, she brought this problem to her primary physician's attention and a KUB was obtained on 10/07/2011, which was unremarkable. This was followed by a CT scan 10/15/2011, which showed ascites and omental caking. Paracentesis 10/18/2011 showed ([id number]) malignant cells consistent with metastatic adenocarcinoma, with equivocal staining for estrogen receptor. The pattern of additional stains was most suggestive of a primary gynecologic tumor. CA 125 at this time was 2051.   The patient was referred to gynecologic oncology and on  10/29/2011 underwent exploratory laparotomy under Drs. Cleda Mccreedy and Antionette Char. This consisted of a total abdominal hysterectomy with bilateral salpingo-oophorectomy, omentectomy, and radical tumor debulking. Unfortunately there was significant tumor attached to the diaphragm so the patient was suboptimally debulked. Accordingly a peritoneal catheter was not placed. GYN-ONC recommended 6 cycles of carboplatin/paclitaxel given every 3 weeks, with Neulasta on day 2 for granulocyte support. [name] [name]'s subsequent history is as detailed below.   INTERVAL HISTORY:  [name] [name] was evaluated in the clinic today accompanied by her husband [name] for complaints  regarding progressive problems with nausea, anorexia, constipation, and pain. Today is day 9 cycle 5 of liposomal doxorubicin.   REVIEW OF SYSTEMS:   Satara tells me she is having "small hard balls" as her only bowel movements. She has a lot of gas and belching. She has a drainage catheter in her abdomen, but this has drained less than 100 cc in the last week and she does not have problems with abdominal distention at this point she is nausea, but has not been vomiting. On the other hand she also has not been eating she has no breakfast today and no supper last night she is drinking some fluids but not much. She is minimally active, doing a little walking around the house and yard, but mostly sitting around and watching TV. Sometimes her legs feel weak, but they have not "given way". There has not been any bleeding on the Xarelto. There has not been any unusual headaches, changes in her vision, dizziness, gait imbalance, or falls. She has some peeling and a little [name] in the palms of her hands, not in her feet. She did not develop mouth sores with the chemotherapy. She denies fever. A detailed review of systems is otherwise noncontributory.   PAST MEDICAL HISTORY: Past Medical History   Diagnosis  Date   .  History of breast  cancer     .  COPD (chronic obstructive pulmonary disease)     .  GERD (gastroesophageal reflux disease)     .  Diabetes mellitus         type II   .  Hx of colonic polyps     .  Osteoarthritis     .  MVP (mitral valve prolapse)     .  Dysrhythmia         pt states runs a little fast    .  Shortness of breath         with exertion    .  Depression     .  Anxiety     .  Heart murmur     .  Cancer         left breast   .  Ovarian cancer         active chemotherapy   .  Stroke     .  Ascites  10/22/2012    PAST SURGICAL HISTORY: Past Surgical History   Procedure  Laterality  Date   .  Cesarean section    1980, 1982       X 2    .  Laparoscopy           work up for infertility   .  Tympanoplasty           left X 2   .  Left breast fibroadenoma removal    1960's   .  Breast cyst aspirations       .  Left modified radial mastectomy       .  Laparotomy    10/29/2011       Procedure: EXPLORATORY LAPAROTOMY;  Surgeon: Rejeana Brock A. Duard Brady, MD;  Location: [facility];  Service: Gynecology;  Laterality: N/A;   .  Abdominal hysterectomy    10/29/2011       Procedure: HYSTERECTOMY ABDOMINAL;  Surgeon: Rejeana Brock A. Duard Brady, MD;  Location: [facility];  Service: Gynecology;  Laterality: N/A;  Total abdominal hysterectomy   .  Salpingoophorectomy    10/29/2011  Procedure: SALPINGO OOPHERECTOMY;  Surgeon: Rejeana Brock A. Duard Brady, MD;  Location: [facility]; Service: Gynecology;  Laterality: Bilateral;    FAMILY HISTORY Family History   Problem  Relation  Age of Onset   .  Cancer  Mother         endometrial/ colon   .  Cancer  Brother         colon   .  COPD  Other     .  Hypertension  Other     .  Cancer  Maternal Uncle         prostate/bladder/lung   .  Cancer  Paternal Aunt         unknown abdominal cancer   .  Cancer  Maternal Grandmother         Salivary gland cancer   .  Cancer  Paternal Aunt         leukemia     the patient's father died at the age of 34. The patient's mother died at the age of 44;  she had a history of colon cancer and endometrial cancer. The patient has no sisters. She has one brother, with a history of colon cancer. There is no history of breast cancer in the family. The patient is scheduled for genetic testing later this month.   GYNECOLOGIC HISTORY: She had menarche age 37. First pregnancy to term was at age 75. She is GX P2. She became menopausal at the time of her chemotherapy for breast cancer. This was age 48. 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 Her husband [name] runs a Psychologist, sport and exercise out of their own home. Daughter [name], 23,  currently lives at home with the patient. There are some issues relating to this daughter that the patient discussed briefly. Daughter. [name], 30, is an ED nurse at [employer]. The patient has no grandchildren. She is not a Advice worker.              ADVANCED DIRECTIVES: in place; the patient has made it very clear that in case of a terminal event she does not want resuscitation or life support measures   HEALTH MAINTENANCE: History   Substance Use Topics   .  Smoking status:  Current Some Day Smoker -- 0.50 packs/day for 30 years       Types:  Cigarettes   .  Smokeless tobacco:  Never Used         Comment: quit again 2009; restarted 1 ppd 2011   .  Alcohol Use:  No                Colonoscopy: 2009             PAP: s/p hysterectomy             Bone density: 2010. "good"             Mammography: 2012 NOV--unremarkable    Allergies   Allergen  Reactions   .  Promethazine         "Makes me feel like I want to jump out of my skin"      Current Outpatient Prescriptions   Medication  Sig  Dispense  Refill   .  albuterol-ipratropium (COMBIVENT) 18-103 MCG/ACT inhaler  Inhale 2 puffs into the lungs every 4 (four) hours as needed. For wheezing   14.7 Inhaler   3   .  Alum & Mag Hydroxide-Simeth (MAGIC  MOUTHWASH W/LIDOCAINE) SOLN  Take 5 mLs by mouth 4  (four) times daily as needed.   250 mL   1   .  atenolol (TENORMIN) 25 MG tablet  Take 12.5 mg by mouth 2 (two) times daily.         .  budesonide-formoterol (SYMBICORT) 160-4.5 MCG/ACT inhaler  Inhale 2 puffs into the lungs 2 (two) times daily.         Marland Kitchen  buPROPion (WELLBUTRIN SR) 150 MG 12 hr tablet  Take 1 tablet (150 mg total) by mouth 2 (two) times daily.   180 tablet   3   .  busPIRone (BUSPAR) 15 MG tablet  Take 1 tablet (15 mg total) by mouth 2 (two) times daily.   180 tablet   2   .  clopidogrel (PLAVIX) 75 MG tablet  Take 1 tablet (75 mg total) by mouth daily with breakfast.   60 tablet   3   .  COMBIVENT RESPIMAT 20-100 MCG/ACT AERS respimat  INHALE 2 PUFFS BY MOUTH EVERY 4 HOURS ASNEEDED FOR WHEEZING   4 g   5   .  diazepam (VALIUM) 5 MG tablet  TAKE 1 TO 2 TABLETS BY MOUTH EVERY NIGHTAT BEDRIME AS NEEDED FOR INSOMNIA   60 tablet   2   .  lovastatin (MEVACOR) 40 MG tablet  Take 1 tablet (40 mg total) by mouth daily.   90 tablet   2   .  morphine 20 MG/5ML solution  Take 2.5 mLs (10 mg total) by mouth every 3 (three) hours as needed for pain.   100 mL   0   .  omeprazole (PRILOSEC) 40 MG capsule  Take 1 capsule (40 mg total) by mouth daily.   90 capsule   3   .  ondansetron (ZOFRAN) 8 MG tablet  Take 1 tablet (8 mg total) by mouth 2 (two) times daily. Take two times a day starting the day after chemo for 2 days. Then take two times a day as needed for nausea or vomiting.   30 tablet   1   .  ONE TOUCH ULTRA TEST test strip  USE AS DIRECTED   100 each   0   .  oxyCODONE-acetaminophen (PERCOCET/ROXICET) 5-325 MG per tablet  Take 1 tablet by mouth every 6 (six) hours as needed for pain.   100 tablet   0   .  prochlorperazine (COMPAZINE) 10 MG tablet  Take 1 tablet (10 mg total) by mouth every 6 (six) hours as needed (Nausea or vomiting).   30 tablet   1   .  sertraline (ZOLOFT) 100 MG tablet  Take 1 tablet (100 mg total) by mouth daily before breakfast.   90 tablet   3   .  [DISCONTINUED]  busPIRone (BUSPAR) 15 MG tablet  Take 1 tablet (15 mg total) by mouth 2 (two) times daily.   180 tablet   1   .  [DISCONTINUED] roflumilast (DALIRESP) 500 MCG TABS tablet  Take 500 mcg by mouth daily.             OBJECTIVE: Older white woman in moderate distress  Filed Vitals:   There were no vitals filed for this visit.  Sclerae unicteric Oropharynx clear, slightly dry   Lungs clear bilaterally to auscultation, no significant dullness to percussion Heart regular rate and rhythm, no murmur appreciated Abdomen soft, nontender, nondistended, positive bowel sounds, Aspira drain is in the lower right abdomen. MSK  no peripheral edema Neuro: nonfocal, well oriented, appropriate affect Breasts: Port is intact in the right upper chest wall, accessed    LAB RESULTS: Basic Metabolic Panel: No results found for this basename: NA, K, CL, CO2, GLUCOSE, BUN, CREATININE, CALCIUM, MG, PHOS,  in the last 168 hours GFR The CrCl is unknown because both a height and weight (above a minimum accepted value) are required for this calculation. Liver Function Tests: No results found for this basename: AST, ALT, ALKPHOS, BILITOT, PROT, ALBUMIN,  in the last 168 hours No results found for this basename: LIPASE, AMYLASE,  in the last 168 hours No results found for this basename: AMMONIA,  in the last 168 hours Coagulation profile No results found for this basename: INR, PROTIME,  in the last 168 hours  CBC: No results found for this basename: WBC, NEUTROABS, HGB, HCT, MCV, PLT,  in the last 168 hours Cardiac Enzymes: No results found for this basename: CKTOTAL, CKMB, CKMBINDEX, TROPONINI,  in the last 168 hours BNP: No components found with this basename: POCBNP,  CBG: No results found for this basename: GLUCAP,  in the last 168 hours D-Dimer No results found for this basename: DDIMER,  in the last 72 hours Hgb A1c No results found for this basename: HGBA1C,  in the last 72 hours Lipid Profile No  results found for this basename: CHOL, HDL, LDLCALC, TRIG, CHOLHDL, LDLDIRECT,  in the last 72 hours Thyroid function studies No results found for this basename: TSH, T4TOTAL, FREET3, T3FREE, THYROIDAB,  in the last 72 hours Anemia work up No results found for this basename: VITAMINB12, FOLATE, FERRITIN, TIBC, IRON, RETICCTPCT,  in the last 72 hours Microbiology No results found for this or any previous visit (from the past 240 hour(s)).    STUDIES: Dg Chest 2 View  12/15/2012   *RADIOLOGY REPORT*  Clinical Data: COPD.  History of left mastectomy.  History of ovarian carcinoma.  CHEST - 2 VIEW  Comparison: PA and lateral chest 09/24/2012.  Findings: Since the prior study, the patient has developed small bilateral pleural effusions, larger on the left.  Lungs are clear. Heart size is normal.  The patient is status post left mastectomy and axillary dissection.  Port-A-Cath again noted.  IMPRESSION: New small bilateral pleural effusions, larger on the left.  No other change.   Original Report Authenticated By: Holley Dexter, M.D.   Dg Abd 1 View  01/06/2013   *RADIOLOGY REPORT*  Clinical Data: Constipation.  ABDOMEN - 1 VIEW  Comparison: CT abdomen and pelvis 11/02/2012 and single view of the abdomen 12/09/2012.  Findings: Drainage catheter in the pelvis is again seen.  There is gaseous distention of loops of small bowel in the pelvis up to 6.0 cm.  Gas and stool are seen in the rectum.  IMPRESSION: Gaseous distention of small bowel could be due to ileus or obstruction.   Original Report Authenticated By: Holley Dexter, M.D.   Dg Abd 1 View  12/09/2012   *RADIOLOGY REPORT*  Clinical Data: Abdominal pain.  Ovarian cancer  ABDOMEN - 1 VIEW  Comparison: 11/07/2012  Findings: Normal bowel gas pattern.  Negative for bowel obstruction.  Peritoneal catheter overlying the upper pelvis is unchanged.  Degenerative changes at L2-3 with lateral subluxation is unchanged. No acute bony lesion.  IMPRESSION: No  acute abnormality.   Original Report Authenticated By: Janeece Riggers, M.D.    Dg Abd 1 View   12/09/2012  *RADIOLOGY REPORT*  Clinical Data: Abdominal pain.  Ovarian cancer  ABDOMEN - 1 VIEW  Comparison: 11/07/2012  Findings: Normal bowel gas pattern.  Negative for bowel obstruction.  Peritoneal catheter overlying the upper pelvis is unchanged.  Degenerative changes at L2-3 with lateral subluxation is unchanged. No acute bony lesion.  IMPRESSION: No acute abnormality.   Original Report Authenticated By: Janeece Riggers, M.D.     ASSESSMENT: 72 y.o. 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 [V323G (1196T>G)] , being admitted with poorly controlled nausea, anorexia, dehydration, and pain in the setting of small bowel obstruction secondary to recurrent ovarian cancer   (1) status post left modified radical mastectomy October 1993 for a T2 N0 invasive ductal breast, estrogen and progesterone receptor positive cancer treated adjuvantly with MFL (methotrexate/fluorouracil/leucovorin) chemotherapy x6, completed April 1994, followed by 5 years of tamoxifen completed April 1999, with no evidence of recurrence   (2) now status post TAH BSO, omentectomy, and suboptimal debulking 10/29/2011 for a high-grade serous adenocarcinoma of the ovary, pT3c NX (Stage IIIC) , s/p eight cycles of carboplatin/paclitaxel completed 05/05/2012 (initial CA125 of 2050.7 normalized after cycle 5)   (3) recurrent disease documented by rising CEA 125 an abdominal CT scan 09/21/2012.   (4) COPD with ongoing tobacco abuse   (5) diabetes mellitus   (6) genetic testing for Lynch syndrome pending.   (7)  status post hospitalization in July 2013 for stroke/small acute infarcts in the left sub insular white matter and coronal radiata.   (8) port in place   (9) left upper lobe 6 mm subpleural nodule, without associated hypermetabolic activity (likely well below the resolution of PET  imaging) noted August 2013   (10) CTs obtained in February 2014 showed evidence of significant progression of disease with widespread intraperitoneal metastases and a moderate volume of presumably malignant ascites.   (11) currently being treated with Doxil on a Q. three-week basis, first dose given on 10/06/2012, fifth cycle given 12/29/2012   (12) cholecystitis, with elective cholecystectomy pending   (13)  recurring ascites, requiring therapeutic paracentesis, status post pending placement of Aspira drain   (14) history of apparent infiltration of TPN  PLAN:   We are admitting Jonny Ruiz so that we can continue hydration and relieve her nausea and pain. If she is indeed obstructive we will treat conservatively. If possible we will try to obtain a CT of the abdomen with contrast this admission to restage her response to treatment. So I expect the patient to be discharged in a few days, in accordance with her wishes I am writing a DO NOT RESUSCITATE order.

## 2013-03-08 ENCOUNTER — Encounter: Payer: Self-pay | Admitting: Specialist

## 2013-03-08 NOTE — Progress Notes (Signed)
I received a phone call from Mr. Herald inquiring about support for their two adult daughters.  I suggested that someone in the [facility] could see them or that they contact [facility]osp[facility] about the counseling provided there.  Mr.Salay went on to talk about his wife's recent hospitalization and resulting quality of life.  I relayed the conversation to Dr. Darnelle Catalan.

## 2013-03-09 ENCOUNTER — Encounter: Payer: Self-pay | Admitting: Physician Assistant

## 2013-03-09 ENCOUNTER — Ambulatory Visit (HOSPITAL_BASED_OUTPATIENT_CLINIC_OR_DEPARTMENT_OTHER): Payer: Medicare Other | Admitting: Physician Assistant

## 2013-03-09 ENCOUNTER — Other Ambulatory Visit: Payer: Self-pay | Admitting: *Deleted

## 2013-03-09 ENCOUNTER — Other Ambulatory Visit (HOSPITAL_BASED_OUTPATIENT_CLINIC_OR_DEPARTMENT_OTHER): Payer: Medicare Other | Admitting: Lab

## 2013-03-09 ENCOUNTER — Telehealth: Payer: Self-pay | Admitting: *Deleted

## 2013-03-09 ENCOUNTER — Ambulatory Visit: Payer: Medicare Other

## 2013-03-09 VITALS — BP 118/72 | HR 116 | Temp 98.0°F | Resp 20 | Ht 64.0 in | Wt 128.6 lb

## 2013-03-09 DIAGNOSIS — R112 Nausea with vomiting, unspecified: Secondary | ICD-10-CM

## 2013-03-09 DIAGNOSIS — C569 Malignant neoplasm of unspecified ovary: Secondary | ICD-10-CM

## 2013-03-09 DIAGNOSIS — F329 Major depressive disorder, single episode, unspecified: Secondary | ICD-10-CM

## 2013-03-09 DIAGNOSIS — D702 Other drug-induced agranulocytosis: Secondary | ICD-10-CM

## 2013-03-09 DIAGNOSIS — R109 Unspecified abdominal pain: Secondary | ICD-10-CM

## 2013-03-09 DIAGNOSIS — K56600 Partial intestinal obstruction, unspecified as to cause: Secondary | ICD-10-CM

## 2013-03-09 DIAGNOSIS — Z853 Personal history of malignant neoplasm of breast: Secondary | ICD-10-CM

## 2013-03-09 DIAGNOSIS — E46 Unspecified protein-calorie malnutrition: Secondary | ICD-10-CM

## 2013-03-09 DIAGNOSIS — F32A Depression, unspecified: Secondary | ICD-10-CM

## 2013-03-09 DIAGNOSIS — F3289 Other specified depressive episodes: Secondary | ICD-10-CM

## 2013-03-09 DIAGNOSIS — R180 Malignant ascites: Secondary | ICD-10-CM

## 2013-03-09 DIAGNOSIS — C786 Secondary malignant neoplasm of retroperitoneum and peritoneum: Secondary | ICD-10-CM

## 2013-03-09 LAB — CBC WITH DIFFERENTIAL/PLATELET
BASO%: 0.2 % (ref 0.0–2.0)
Eosinophils Absolute: 0 10*3/uL (ref 0.0–0.5)
LYMPH%: 17.7 % (ref 14.0–49.7)
MCHC: 31.9 g/dL (ref 31.5–36.0)
MONO#: 0.6 10*3/uL (ref 0.1–0.9)
NEUT#: 7.1 10*3/uL — ABNORMAL HIGH (ref 1.5–6.5)
Platelets: 196 10*3/uL (ref 145–400)
RBC: 4.15 10*6/uL (ref 3.70–5.45)
RDW: 17.1 % — ABNORMAL HIGH (ref 11.2–14.5)
WBC: 9.5 10*3/uL (ref 3.9–10.3)
lymph#: 1.7 10*3/uL (ref 0.9–3.3)
nRBC: 0 % (ref 0–0)

## 2013-03-09 MED ORDER — SERTRALINE HCL 20 MG/ML PO CONC: 140.0000 mg | Freq: Every day | ORAL | Status: AC

## 2013-03-09 MED ORDER — FENTANYL 25 MCG/HR TD PT72: 1.0000 | MEDICATED_PATCH | TRANSDERMAL | Status: AC

## 2013-03-09 MED ORDER — MIRTAZAPINE 15 MG PO TBDP: 15.0000 mg | ORAL_TABLET | Freq: Every day | ORAL | Status: DC

## 2013-03-09 MED ORDER — PROCHLORPERAZINE 25 MG RE SUPP: 25.0000 mg | Freq: Three times a day (TID) | RECTAL | Status: AC | PRN

## 2013-03-09 MED ORDER — BUPROPION HCL 75 MG PO TABS: 75.0000 mg | ORAL_TABLET | Freq: Four times a day (QID) | ORAL | Status: DC

## 2013-03-09 NOTE — Telephone Encounter (Signed)
MD notified of panic k+ level of 2.4 per labs drawn by [facility].  This RN contacted [facility] pharmacy and spoke with Miranda per MD recommendations for Kcl by IV today then increase in TNA. Per Tamera Punt she states what is possible to provide in the home is 1L NS with of K+ to run over 4 hours. This can be done daily until Friday 8/8 when new TNA is delivered to home with changes in amount of K+.  Above will be implemented.  This RN also spoke with Roney Marion RN to verify if additional orders needed per nursing care- Roney Marion stated " pharmacy will notifiy [place] of what needs to be done ">

## 2013-03-09 NOTE — Telephone Encounter (Signed)
Pt's dtr called to this RN to state per further discussion with pharmacist additional recommendations were given that " to me sound more appropriate for my mom versus crushing the wellbutrin".  [name] [name] states recommendation of replacement for wellbutrin of ensam or remeron sol tab.  Above discussed with AB/PA who recommended proceeding with remeron and discontinue wellbutrin.  This RN called and discussed with [name] [name] and then spoke directly to pharmacist at [facility].

## 2013-03-09 NOTE — Progress Notes (Addendum)
ID: [name] [name]   DOB: 1941/02/22  MR#: [id number]  CSN#:[id number]  PCP:  GYN: [name] Schimke, MD SU: [name] OTHER MD:  HISTORY OF PRESENT ILLNESS: [name] [name] is a 72 year-old [name] I followed remotely for a history of breast cancer. More recently she noted that her belly was getting "bigger". Gradually she has developed increasing low back pain. After a period of several months, as her symptoms increased, she brought this problem to her primary physician's attention and a KUB was obtained on 10/07/2011, which was unremarkable. This was followed by a CT scan 10/15/2011, which showed ascites and omental caking. Paracentesis 10/18/2011 showed ([id number]) malignant cells consistent with metastatic adenocarcinoma, with equivocal staining for estrogen receptor. The pattern of additional stains was most suggestive of a primary gynecologic tumor. CA 125 at this time was 2051.  The patient was referred to gynecologic oncology and on 10/29/2011 underwent exploratory laparotomy under Drs. [name] [name] and [name] [name]. This consisted of a total abdominal hysterectomy with bilateral salpingo-oophorectomy, omentectomy, and radical tumor debulking. Unfortunately there was significant tumor attached to the diaphragm so the patient was suboptimally debulked. Accordingly a peritoneal catheter was not placed. GYN-ONC recommended 6 cycles of carboplatin/paclitaxel given every 3 weeks, with Neulasta on day 2 for granulocyte support. [name] [name]'s subsequent history is as detailed below.  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 She is due for day 1 cycle 3 of every three-week carboplatin today.  Interval history is remarkable for recent hospitalization for small bowel obstruction between July 17 and August 2. She had surgery under the care of Dr. Daphine [name] on 02/26/2013 for the placement of an NG tube.It is felt  that the small bowel obstruction will not clear, and is likely secondary primarily to scar tissue.   [name] [name]'s functional status has decreased significantly since I saw her last 3 weeks ago. She is weak. She appears dehydrated and tells me she is "thirsty all the time". They're unable to clamp her tube for more than 10 or 15 minutes maximum before she becomes nauseous and vomits.  She continues to receive TNA nightly. She's also been scheduled to receive IV fluids with IV potassium at home every day this week, pending adjustment to the TNA on 03/12/2013. She continues to have chronic abdominal pain. She is unable to swallow pills.  Perhaps one of our biggest concerns today, however, is the fact that all of [name] [name]'s antidepressants were discontinued over the weekend. She stopped all these medications suddenly, including Zoloft, Wellbutrin, and BuSpar, all of which she has been on for quite some time. She has become more depressed and more anxious over the last several days, and has "no motivation".  REVIEW OF SYSTEMS:  [name] [name] denies any fevers or chills. She's had no skin changes and denies any signs of abnormal bleeding. As noted above, she continues to have problems with nausea and emesis. Of course she is currently unable to eat She's had no abnormal headaches or dizziness.  A detailed review of systems is otherwise stable.   PAST MEDICAL HISTORY: Past Medical History  Diagnosis Date  . History of breast cancer   . COPD (chronic obstructive pulmonary disease)   . GERD (gastroesophageal reflux disease)   . Diabetes mellitus     type II  . Hx of colonic polyps   . Osteoarthritis   . MVP (mitral valve prolapse)   . Dysrhythmia     pt states runs  a little fast   . Shortness of breath     with exertion   . Depression   . Anxiety   . Heart murmur   . Stroke   . Ascites 10/22/2012  . metastases dx'd 10/2011  . Ovarian cancer dx'd2/2013    active chemotherapy  . Breast CA 1993  .  Partial small bowel obstruction 01/2013    PAST SURGICAL HISTORY: Past Surgical History  Procedure Laterality Date  . Cesarean section  1980, 1982    X 2   . Laparoscopy      work up for infertility  . Tympanoplasty      left X 2  . Left breast fibroadenoma removal  1960's  . Breast cyst aspirations    . Left modified radial mastectomy      >20 years ago  . Laparotomy  10/29/2011    Procedure: EXPLORATORY LAPAROTOMY;  Surgeon: Rejeana Brock A. Duard Brady, MD;  Location: [facility];  Service: Gynecology;  Laterality: N/A;  . Abdominal hysterectomy  10/29/2011    Procedure: HYSTERECTOMY ABDOMINAL;  Surgeon: Rejeana Brock A. Duard Brady, MD;  Location: [facility];  Service: Gynecology;  Laterality: N/A;  Total abdominal hysterectomy  . Salpingoophorectomy  10/29/2011    Procedure: SALPINGO OOPHERECTOMY;  Surgeon: Rejeana Brock A. Duard Brady, MD;  Location: [facility];  Service: Gynecology;  Laterality: Bilateral;  . Laparotomy N/A 02/26/2013    Procedure: EXPLORATORY LAPAROTOMY WITH GASTROSTOMY TUBE PLACEMENT ;  Surgeon: [name] Merino, MD;  Location: [facility];  Service: General;  Laterality: N/A;  . Upper gi endoscopy  02/26/2013    Procedure: UPPER GI ENDOSCOPY;  Surgeon: [name] Merino, MD;  Location: [facility];  Service: General;;    FAMILY HISTORY Family History  Problem Relation Age of Onset  . Cancer Mother     endometrial/ colon  . Cancer Brother     colon  . COPD Other   . Hypertension Other   . Cancer Maternal Uncle     prostate/bladder/lung  . Cancer Paternal Aunt     unknown abdominal cancer  . Cancer Maternal Grandmother     Salivary gland cancer  . Cancer Paternal Aunt     leukemia   the patient's father died at the age of 76. The patient's mother died at the age of 57; she had a history of colon cancer and endometrial cancer. The patient has no sisters. She has one brother, with a history of colon cancer. There is no history of breast cancer in the family. The patient is scheduled for genetic testing later this  month.  GYNECOLOGIC HISTORY: She had menarche age 69. First pregnancy to term was at age 15. She is GX P2. She became menopausal at the time of her chemotherapy for breast cancer. This was age 46. 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 Her husband [name] [name] runs a Psychologist, sport and exercise out of their own home. Daughter [name], 79,  currently lives at home with the patient. There are some issues relating to this daughter that the patient discussed briefly. [place]. [name] [name], 30, is an ED nurse at [employer]. The patient has no grandchildren. She is not a Advice worker.   ADVANCED DIRECTIVES: in place  HEALTH MAINTENANCE: History  Substance Use Topics  . Smoking status: Current Some Day Smoker -- 0.50 packs/day for 30 years    Types: Cigarettes  . Smokeless tobacco: Never Used     Comment: quit again 2009; restarted 1 ppd  2011  . Alcohol Use: No     Colonoscopy: 2009  PAP: s/p hysterectomy  Bone density: 2010. "good"  Mammography: 2012 NOV--unremarkable  Allergies  Allergen Reactions  . Promethazine     "Makes me feel like I want to jump out of my skin"    Prior to Admission medications   Medication Sig Start Date End Date Taking? Authorizing Provider  albuterol (PROVENTIL) (2.5 MG/3ML) 0.083% nebulizer solution Take 2.5 mg by nebulization every 6 (six) hours as needed for wheezing.   Yes Historical Provider, MD  ALPRAZolam Prudy Feeler) 0.5 MG tablet Take 1 tablet (0.5 mg total) by mouth 2 (two) times daily as needed for sleep. 01/19/13  Yes Geovanni Rahming Allegra Grana, PA-C  atenolol (TENORMIN) 25 MG tablet Take 12.5 mg by mouth 2 (two) times daily.   Yes Historical Provider, MD  budesonide-formoterol (SYMBICORT) 160-4.5 MCG/ACT inhaler Inhale 2 puffs into the lungs 2 (two) times daily.   Yes Historical Provider, MD  buPROPion (WELLBUTRIN SR) 150 MG 12 hr tablet Take 1 tablet (150 mg total) by mouth 2 (two) times daily. 07/17/12 07/17/13  Yes Georgina Quint Plotnikov, MD  busPIRone (BUSPAR) 15 MG tablet Take 1 tablet (15 mg total) by mouth 2 (two) times daily. 07/01/12 07/01/13 Yes Georgina Quint Plotnikov, MD  clopidogrel (PLAVIX) 75 MG tablet Take 1 tablet (75 mg total) by mouth daily with breakfast. 10/02/12 10/02/13 Yes Georgina Quint Plotnikov, MD  diazepam (VALIUM) 5 MG tablet Take 5-10 mg by mouth at bedtime as needed for sleep.   Yes Historical Provider, MD  docusate sodium (COLACE) 100 MG capsule Take 100 mg by mouth 2 (two) times daily.   Yes Historical Provider, MD  Ipratropium-Albuterol (COMBIVENT RESPIMAT) 20-100 MCG/ACT AERS respimat Inhale 1 puff into the lungs every 6 (six) hours as needed for wheezing or shortness of breath.   Yes Historical Provider, MD  lovastatin (MEVACOR) 40 MG tablet Take 1 tablet (40 mg total) by mouth daily. 06/15/12  Yes Georgina Quint Plotnikov, MD  omeprazole (PRILOSEC) 40 MG capsule Take 1 capsule (40 mg total) by mouth daily. 07/08/12 07/08/13 Yes Georgina Quint Plotnikov, MD  ondansetron (ZOFRAN) 4 MG/2ML SOLN injection Inject 4 mg into the vein at bedtime. Pt gets through her port.   Yes Historical Provider, MD  ONE TOUCH ULTRA TEST test strip USE AS DIRECTED 11/18/12  Yes Tresa Garter, MD  oxyCODONE-acetaminophen (PERCOCET/ROXICET) 5-325 MG per tablet Take 1 tablet by mouth every 6 (six) hours as needed for pain. 02/02/13  Yes Lowella Dell, MD  polyethylene glycol (MIRALAX / GLYCOLAX) packet Take 17 g by mouth daily.   Yes Historical Provider, MD  PRESCRIPTION MEDICATION at bedtime. Home Paranatal Nutrition from [facility].   Yes Historical Provider, MD  ranitidine (ZANTAC) 150 MG tablet Take 1 tablet (150 mg total) by mouth 2 (two) times daily. 12/23/12 12/23/13 Yes Georgina Quint Plotnikov, MD  sertraline (ZOLOFT) 100 MG tablet Take 1 tablet (100 mg total) by mouth daily before breakfast. 09/22/12  Yes Georgina Quint Plotnikov, MD  UNABLE TO FIND Inject 30 Units into the vein Nightly. Med Name:  INSULIN IN TPN  NIGHTLY   Yes Historical Provider, MD    OBJECTIVE: [name] [name] who appears weak and uncomfortable, presenting in a wheelchair today where she is examined. Filed Vitals:   03/09/13 1118  BP: 118/72  Pulse: 116  Temp: 98 F (36.7 C)  Resp: 20     Body mass index is 22.06 kg/(m^2).  ECOG FS: 2 Filed Weights   03/09/13 1118  Weight: 128 lb 9.6 oz (58.333 kg)   Sclerae unicteric Oropharynx clear, no ulcerations or evidence of thrush. Buccal mucosa is dry. Lungs: diminished breath sounds bilaterally in the bases; No wheezes or rhonchi Heart regular rate and rhythm Abdomen Dry, clean dressing is in place superior to the umbilicus, status post recent insertion of NG tube Nonpitting pedal edema bilaterally, equal bilaterally. Neuro: nonfocal, well oriented, depressed affect Breasts: Deferred.    LAB RESULTS: Lab Results  Component Value Date   WBC 9.5 03/09/2013   NEUTROABS 7.1* 03/09/2013   HGB 11.9 03/09/2013   HCT 37.3 03/09/2013   MCV 89.9 03/09/2013   PLT 196 03/09/2013      Chemistry      Component Value Date/Time   NA 139 03/02/2013 1000   NA 136 02/16/2013 1020   K 2.6* 03/02/2013 1000   K 4.4 02/16/2013 1020   CL 89* 03/02/2013 1000   CL 97* 01/26/2013 1334   CO2 >45* 03/02/2013 1000   CO2 21* 02/16/2013 1020   BUN 26* 03/02/2013 1000   BUN 27.3* 02/16/2013 1020   CREATININE 0.52 03/02/2013 1000   CREATININE 0.7 02/16/2013 1020      Component Value Date/Time   CALCIUM 8.5 03/02/2013 1000   CALCIUM 9.0 02/16/2013 1020   ALKPHOS 116 03/01/2013 0830   ALKPHOS 138 02/16/2013 1020   AST 18 03/01/2013 0830   AST 29 02/16/2013 1020   ALT 16 03/01/2013 0830   ALT 27 02/16/2013 1020   BILITOT 0.3 03/01/2013 0830   BILITOT <0.20 Repeated and Verified 02/16/2013 1020     CA 125  284.7  02/23/2013    370.4   02/16/2013    478.4              01/26/2013    462.6  01/12/2013    475.8  12/15/2012    515.9  12/08/2012    473.0  11/17/2012    599.7  10/27/2012    1066.3  10/06/2012    STUDIES:  Dg Abd 1 View  02/25/2013   *RADIOLOGY REPORT*  Clinical Data: History of small-bowel obstruction.  Distention of abdomen.  Follow-up.  ABDOMEN - 1 VIEW  Comparison: 02/24/2013.  Findings: Tip of enteric tube is in the proximal area of the stomach and could be advanced.  There is continued dilatation of loops of small intestine in the abdomen.  The largest loop has a greatest caliber of 5 cm which is slightly larger than on previous study.  There is some colonic gas present.  No opaque calculi are seen.  Pelvic phlebolith is present.  Surgical clip is seen in the pelvis.  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  IMPRESSION: Tip of enteric tube is in the proximal area of the stomach and could be advanced.  There is continued dilatation of loops of small intestine in the abdomen.  The largest loop has a greatest caliber of 5 cm which is slightly larger than on previous study.   Original Report Authenticated By: Onalee Hua Call   Dg Abd 1 View  02/24/2013   *RADIOLOGY REPORT*  Clinical Data: Small bowel obstruction.  ABDOMEN - 1 VIEW  Comparison: 7/21, 7/19, 7/17, and 01/07/2013  Findings: Tip of the NG tube is in the fundus of the stomach and could be advanced.  The dilated small bowel in the mid abdomen and pelvis is slightly less prominent.  The colon is not distended.  There is  evidence of ascites.  No acute osseous abnormality.  No visible free air on this supine radiograph.  IMPRESSION:  1.  Slight decreased distention of small bowel in the mid abdomen. 2.  NG tube tip is in the fundus of the stomach and could be advanced 10-15 cm.   Original Report Authenticated By: Francene Boyers, M.D.   Dg Abd 1 View  02/22/2013   **ADDENDUM** CREATED: 02/22/2013 18:01:07  For position of the nasogastric tube within the stomach, tube could be advanced  approximately 10 cm.  **END ADDENDUM** SIGNED BY: Blair Hailey. Manson Passey, M.D.  02/22/2013   *RADIOLOGY REPORT*  Clinical Data: Abdominal pain, small bowel obstruction  ABDOMEN - 1 VIEW  Comparison: 02/19/2013  Findings: The nasogastric catheter is withdrawn somewhat and now lies within the distal esophagus.  There remain dilated loops of small bowel centrally within the abdomen. The maximum dimension is approximate 4.6 cm.  Stomach appears mildly decompressed.  Minimal colonic gas is noted.  IMPRESSION: Persistent small bowel obstruction.  Nasogastric catheter now lies within the distal esophagus.   Original Report Authenticated By: Alcide Clever, M.D.   Dg Abd 1 View  02/19/2013   *RADIOLOGY REPORT*  Clinical Data: Small bowel obstruction  ABDOMEN - 1 VIEW  Comparison: 02/18/2013  Findings: NG tube in the proximal stomach.  Stomach is decompressed.  Dilated loops of small bowel remain in the mid abdomen with a diameter of 4.3 cm.  Findings compatible with persistent small bowel obstruction.  Colon is decompressed.  IMPRESSION:  No significant change in S B O pattern.   Original Report Authenticated By: Judie Petit. Miles Costain, M.D.   Dg Abd 2 Views  02/18/2013   *RADIOLOGY REPORT*  Clinical Data: Ovarian cancer.  Abdominal pain with nausea and vomiting  ABDOMEN - 2 VIEW  Comparison: CT 01/07/2013  Findings: Small bowel is mildly distended and contains air fluid levels suggesting small bowel obstruction.  No free air.  Colon is decompressed.  IMPRESSION: Findings consistent with small bowel obstruction.   Original Report Authenticated By: Janeece Riggers, M.D.     ASSESSMENT: 72 y.o.  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)    (1) status post left modified radical mastectomy October 1993 for a T2 N0 invasive ductal breast, estrogen and progesterone receptor positive cancer treated adjuvantly with MFL (methotrexate/fluorouracil/leucovorin) chemotherapy x6, completed  April 1994, followed by 5 years of tamoxifen completed April 1999, with no evidence of recurrence  (2) now status post TAH BSO, omentectomy, and suboptimal debulking 10/29/2011 for a high-grade serous adenocarcinoma of the ovary, pT3c NX (Stage IIIC) , s/p eight cycles of carboplatin/paclitaxel completed 05/05/2012 (initial CA125 of 2050.7 normalized after cycle 5)  (3) recurrent disease documented by rising CEA 125 an abdominal CT scan 09/21/2012.  (4) COPD with ongoing tobacco abuse  (5) diabetes mellitus  (6) genetic testing for Lynch syndrome pending.  (7)  status post hospitalization in July 2013 for stroke/small acute infarcts in the left sub insular white matter and coronal radiata.  (8) port in place  (9) left upper lobe 6 mm subpleural nodule, without associated hypermetabolic activity (likely well below the resolution of PET imaging) noted August 2013  (10) CTs obtained in [place] 2014 showed evidence of significant progression of disease with widespread intraperitoneal metastases and a moderate volume of presumably malignant ascites.  (11) treated with Doxil on a Q. three-week basis, first dose given on 10/06/2012, discontinued after 5 cycles, last dose given on  12/29/2012, secondary to hand/foot syndrome and skin changes.  (12) cholecystitis, with cholecystectomy pending  (13)  Hx of recurring ascites, requiring therapeutic paracentesis and Aspira drain, removed on 01/15/13  (14) Malnutrition, on TPN.  Apparent infiltration of TPN noted on 12/15/12, resolved   (15)  small bowel obstruction requiring hospitalization, 01/06/2013 - 01/08/2013, and 02/18/2013 - 03/06/2013.  NG tube placed by Dr. Luretha Murphy 02/26/2013.  (16)  carboplatin to be given every 21 days, first dose on 01/26/2013    PLAN:  Dr. Darnelle Catalan and I saw this patient together today, with more than half of our 50 minute appointment being spent reviewing the patient's many concerns, discussing treatment  options, and coordinating care.  [name] [name] simply feels too weak to receive chemotherapy today, although she "isn't ready to stop it" altogether. She is scheduled to for IV fluids with IV potassium through home health later today, and she chooses to go home for IV hydration and rest in place of treatment today. We will plan on assessing her again next week on August 13, and we'll hope to proceed with day 1 cycle 3 of carboplatin at that time. [name] [name] has a full understanding of the fact that this might help the cancer, but it is likely not going to resolve her small bowel obstruction or increase her quality of life.  While we discussed this today, she was really not in the proper mindset to make any decisions due to the fact that she has been off of all of her antidepressants "cold [place]" now for several days . I havespoken with the pharmacy for further guidance on her medications since she cannot swallow the pills. Fortunately the Zoloft comes in oral concentrate. She has been on 100 mg daily, and we are increasing this slightly to 140 mg (7 mL) daily. The Wellbutrin can be given in 75 mg immediate release tablets. These can be crushed and given to her in a small amount of water 4 times daily to equal her normal 300 mg daily dose. We will discontinue BuSpar at this time. I did encourage her daughter [name] [name] to use Ativan or Xanax as needed sublingually since this should absorb very quickly.   We are also contacting her home health agency to request dressing changes every other day to the area around her NG tube. She also needs a replacement of Foley bag. Malloree herself requests a physical therapy and occupational therapy consult, and we will place the order as well.  With regards to followup, she scheduled to see her surgeon, Dr. Daphine [name] come tomorrow and will see Dr. Nelly Rout later this week on August 7. I will see [name] [name] back here next Wednesday, August 13 for labs, physical exam, and a decision on  whether or not to proceed with carboplatin at that time. She will see Dr. Darnelle Catalan 3 weeks later on September 3 for further evaluation and discussion of her treatment plan.  [name] [name] and [name] [name] both voice understanding and agreement with our plan. They will call with any changes or problems.  [name] [name]    03/09/2013   ADDENDUM: I met with [name] [name] and her daughter [name] [name] today to go over the situation. Separately I had met with our chaplain and counselor, Lenell Antu, who told me she had met with the patient's husband and that [name] [name] had expressed to her questions regarding "wire we continuing to treat" the patient, and whether the patient's quality of life was ever going to improve.  I went over the fact that the chemotherapy, which  Lamica is tolerating well, appears to be working, and that her CA 125 is dropping nicely. At the same time there is no improvement in her small bowel obstruction and from the impression of the surgeon who placed the gastric tube, there is not going to be any improvement in the small bowel obstruction: The problem is not one of cancer but scarring. Accordingly, while chemotherapy will control the cancer, it is not likely to improve [name] [name] of life.  Today the patient was not ready to make a definitive decision whether she wants to continue treatment or not. We're going to correct her potassium and give her some fluids. She will be seen next week and likely receive carboplatin at that time. She will see me again in 3 weeks and we can further discuss long-term plans at that point. If the patient decides to forgo chemotherapy, there mightbe no significant change in any of the support services we are providing unless she also decides that she does not want TNA. and intravenous hydration.  Note that the patient's hospice defining illness would be her ovarian cancer, which would not be treated. This small bowel obstruction is a separate problem, and in my  opinion it could continue to be treated through home health, outside of the hospice umbrella, if the patient decided to continue parenteral fluids and nutrition.  I personally saw this patient and performed a substantive portion of this encounter with the listed APP documented above.   Lowella Dell, MD

## 2013-03-09 NOTE — Telephone Encounter (Signed)
Received call from Yvonne/Pt Case Manager/[facility] reporting critical labs of K+ 2.5 & CO2 43.  Reported to Dr Darnelle Catalan.

## 2013-03-10 ENCOUNTER — Encounter (INDEPENDENT_AMBULATORY_CARE_PROVIDER_SITE_OTHER): Payer: Medicare Other | Admitting: Surgery

## 2013-03-10 ENCOUNTER — Telehealth: Payer: Self-pay | Admitting: *Deleted

## 2013-03-10 NOTE — Telephone Encounter (Signed)
sw pt informed her that her appts scheduled for 8/26 and 9/2 are cancelled. Made pt aware that after her ov on 04/04/2013 tx will follow. Also gv appts for labs @ 12:30pm, ov@1pm , and tx to follow on 04/07/13. i emailed MB to add the tx's...td

## 2013-03-11 ENCOUNTER — Ambulatory Visit: Payer: Medicare Other | Attending: Gynecologic Oncology | Admitting: Gynecologic Oncology

## 2013-03-11 ENCOUNTER — Encounter (INDEPENDENT_AMBULATORY_CARE_PROVIDER_SITE_OTHER): Payer: Self-pay | Admitting: Surgery

## 2013-03-11 ENCOUNTER — Ambulatory Visit (INDEPENDENT_AMBULATORY_CARE_PROVIDER_SITE_OTHER): Payer: Medicare Other | Admitting: Surgery

## 2013-03-11 VITALS — BP 124/78 | HR 108 | Temp 98.8°F | Resp 14 | Ht 64.0 in | Wt 129.8 lb

## 2013-03-11 DIAGNOSIS — C569 Malignant neoplasm of unspecified ovary: Secondary | ICD-10-CM

## 2013-03-11 NOTE — Progress Notes (Signed)
[name] [name] 72 y.o.  Body mass index is 22.27 kg/(m^2).  Patient Active Problem List   Diagnosis Date Noted  . Nausea and vomiting 02/18/2013  . Anxiety 01/19/2013  . Partial small bowel obstruction 11/03/2012  . Ascites 10/22/2012  . Ataxia 07/17/2012  . Stroke 02/21/2012  . Speech and language disorder 02/21/2012  . Constipation 01/07/2012  . Ovarian cancer 11/15/2011  . Pelvic mass in female 10/22/2011  . Ascites, malignant 10/22/2011  . Omental mass 10/16/2011  . Pelvic mass 10/16/2011  . Abdominal pain, unspecified site 10/14/2011  . Abdominal distension 10/07/2011  . Depression 03/27/2011  . CAROTID BRUIT 08/28/2010  . BRONCHITIS, ACUTE 01/17/2010  . CHRONIC RHINITIS 06/23/2009  . OSTEOARTHRITIS 06/23/2009  . TOBACCO USE DISORDER/SMOKER-SMOKING CESSATION DISCUSSED 05/09/2008  . DYSPNEA 04/25/2008  . COUGH 04/25/2008  . DYSLIPIDEMIA 01/20/2008  . MITRAL VALVE PROLAPSE 01/20/2008  . CHOLELITHIASIS 01/20/2008  . COLONIC POLYPS, HX OF 07/22/2007  . DM w/o Complication Type II 07/20/2007  . COPD 06/24/2007  . GERD 06/24/2007  . BREAST CANCER, HX OF 06/24/2007    Allergies  Allergen Reactions  . Promethazine     "Makes me feel like I want to jump out of my skin"    Past Surgical History  Procedure Laterality Date  . Cesarean section  1980, 1982    X 2   . Laparoscopy      work up for infertility  . Tympanoplasty      left X 2  . Left breast fibroadenoma removal  1960's  . Breast cyst aspirations    . Left modified radial mastectomy      >20 years ago  . Laparotomy  10/29/2011    Procedure: EXPLORATORY LAPAROTOMY;  Surgeon: Rejeana Brock A. Duard Brady, MD;  Location: [facility];  Service: Gynecology;  Laterality: N/A;  . Abdominal hysterectomy  10/29/2011    Procedure: HYSTERECTOMY ABDOMINAL;  Surgeon: Rejeana Brock A. Duard Brady, MD;  Location: [facility];  Service: Gynecology;  Laterality: N/A;  Total abdominal hysterectomy  . Salpingoophorectomy  10/29/2011    Procedure: SALPINGO  OOPHERECTOMY;  Surgeon: Rejeana Brock A. Duard Brady, MD;  Location: [facility];  Service: Gynecology;  Laterality: Bilateral;  . Laparotomy N/A 02/26/2013    Procedure: EXPLORATORY LAPAROTOMY WITH GASTROSTOMY TUBE PLACEMENT ;  Surgeon: [name] Merino, MD;  [facility];  Service: General;  Laterality: N/A;  . Upper gi endoscopy  02/26/2013    Procedure: UPPER GI ENDOSCOPY;  Surgeon: [name] Merino, MD;  [facility]: [facility];  Service: General;;   Laurette Schimke, MD No diagnosis found.  Staples removed.  G tube site redressed.  Discussed plans for palliative care vs continued chemotherapy.  Deferred to Dr. Darnelle Catalan.  Will see as needed.  Matt B. Daphine Deutscher, MD, [facility]: [facility] [phone] beeper [phone]  03/11/2013 11:14 AM

## 2013-03-11 NOTE — Patient Instructions (Signed)
Thanks for your patience.  If you need further assistance after leaving the office, please call our office and speak with a CCS nurse.  [phone].  If you want to leave a message for Dr. Chipper Koudelka, please call his office phone at [phone]. 

## 2013-03-12 ENCOUNTER — Other Ambulatory Visit: Payer: Self-pay | Admitting: Physician Assistant

## 2013-03-12 ENCOUNTER — Ambulatory Visit (INDEPENDENT_AMBULATORY_CARE_PROVIDER_SITE_OTHER): Payer: Medicare Other | Admitting: Surgery

## 2013-03-12 ENCOUNTER — Telehealth: Payer: Self-pay | Admitting: *Deleted

## 2013-03-12 ENCOUNTER — Telehealth (INDEPENDENT_AMBULATORY_CARE_PROVIDER_SITE_OTHER): Payer: Self-pay | Admitting: *Deleted

## 2013-03-12 ENCOUNTER — Encounter (INDEPENDENT_AMBULATORY_CARE_PROVIDER_SITE_OTHER): Payer: Self-pay | Admitting: Surgery

## 2013-03-12 ENCOUNTER — Other Ambulatory Visit: Payer: Self-pay | Admitting: Oncology

## 2013-03-12 VITALS — BP 112/64 | HR 86 | Resp 18 | Ht 64.0 in | Wt 129.0 lb

## 2013-03-12 DIAGNOSIS — C569 Malignant neoplasm of unspecified ovary: Secondary | ICD-10-CM

## 2013-03-12 NOTE — Telephone Encounter (Signed)
Patient's daughter called this morning to report that patient has bubbling and what she believes to be gastric contents coming from around her gastrostomy tube.  Daughter starts out by reporting dark coffee grain looking blood draining into the bag but then as this RN questions her more she states she thinks its just old blood draining into the tube.  Paged Dr. Daphine Deutscher at this time to figure out if patient should be put on urgent office, sent to the ED, or what he wanted the plan to be.  Dr. Daphine Deutscher states that he will call to speak with the patient following his surgeries to determine the appropriate plan.

## 2013-03-12 NOTE — Telephone Encounter (Signed)
[name] [name] updated at this time and agreeable with plan.

## 2013-03-12 NOTE — Patient Instructions (Addendum)
Thanks for your patience.  If you need further assistance after leaving the office, please call our office and speak with a [facility] nurse.  [phone].  If you want to leave a message for Dr. Armenia Silveria, please call his office phone at [phone]. 

## 2013-03-12 NOTE — Progress Notes (Signed)
Returned for bleeding and leaking around tube.  Sutures to skin removed and Duoderm applied around the G tube.   She has gotten Hospice involved and I will be available to see as needed.

## 2013-03-15 ENCOUNTER — Inpatient Hospital Stay (HOSPITAL_COMMUNITY)
Admission: EM | Admit: 2013-03-15 | Discharge: 2013-04-05 | DRG: 754 | Disposition: E | Attending: Family Medicine | Admitting: Family Medicine

## 2013-03-15 ENCOUNTER — Telehealth: Payer: Self-pay | Admitting: *Deleted

## 2013-03-15 ENCOUNTER — Encounter (HOSPITAL_COMMUNITY): Payer: Self-pay | Admitting: *Deleted

## 2013-03-15 DIAGNOSIS — Z853 Personal history of malignant neoplasm of breast: Secondary | ICD-10-CM

## 2013-03-15 DIAGNOSIS — C569 Malignant neoplasm of unspecified ovary: Principal | ICD-10-CM | POA: Diagnosis present

## 2013-03-15 DIAGNOSIS — Z515 Encounter for palliative care: Secondary | ICD-10-CM

## 2013-03-15 DIAGNOSIS — E119 Type 2 diabetes mellitus without complications: Secondary | ICD-10-CM | POA: Diagnosis present

## 2013-03-15 DIAGNOSIS — K219 Gastro-esophageal reflux disease without esophagitis: Secondary | ICD-10-CM | POA: Diagnosis present

## 2013-03-15 DIAGNOSIS — Z79899 Other long term (current) drug therapy: Secondary | ICD-10-CM

## 2013-03-15 DIAGNOSIS — E43 Unspecified severe protein-calorie malnutrition: Secondary | ICD-10-CM | POA: Diagnosis present

## 2013-03-15 DIAGNOSIS — F329 Major depressive disorder, single episode, unspecified: Secondary | ICD-10-CM | POA: Diagnosis present

## 2013-03-15 DIAGNOSIS — F3289 Other specified depressive episodes: Secondary | ICD-10-CM | POA: Diagnosis present

## 2013-03-15 DIAGNOSIS — R5381 Other malaise: Secondary | ICD-10-CM | POA: Diagnosis present

## 2013-03-15 DIAGNOSIS — R0681 Apnea, not elsewhere classified: Secondary | ICD-10-CM | POA: Diagnosis not present

## 2013-03-15 DIAGNOSIS — C801 Malignant (primary) neoplasm, unspecified: Secondary | ICD-10-CM | POA: Diagnosis present

## 2013-03-15 DIAGNOSIS — R627 Adult failure to thrive: Secondary | ICD-10-CM | POA: Diagnosis present

## 2013-03-15 DIAGNOSIS — J4489 Other specified chronic obstructive pulmonary disease: Secondary | ICD-10-CM | POA: Diagnosis present

## 2013-03-15 DIAGNOSIS — J449 Chronic obstructive pulmonary disease, unspecified: Secondary | ICD-10-CM | POA: Diagnosis present

## 2013-03-15 DIAGNOSIS — R451 Restlessness and agitation: Secondary | ICD-10-CM

## 2013-03-15 DIAGNOSIS — I059 Rheumatic mitral valve disease, unspecified: Secondary | ICD-10-CM | POA: Diagnosis present

## 2013-03-15 DIAGNOSIS — Z8673 Personal history of transient ischemic attack (TIA), and cerebral infarction without residual deficits: Secondary | ICD-10-CM

## 2013-03-15 DIAGNOSIS — Z66 Do not resuscitate: Secondary | ICD-10-CM | POA: Diagnosis present

## 2013-03-15 DIAGNOSIS — IMO0002 Reserved for concepts with insufficient information to code with codable children: Secondary | ICD-10-CM

## 2013-03-15 DIAGNOSIS — F17200 Nicotine dependence, unspecified, uncomplicated: Secondary | ICD-10-CM | POA: Diagnosis present

## 2013-03-15 DIAGNOSIS — F411 Generalized anxiety disorder: Secondary | ICD-10-CM | POA: Diagnosis present

## 2013-03-15 DIAGNOSIS — Z7401 Bed confinement status: Secondary | ICD-10-CM

## 2013-03-15 DIAGNOSIS — W06XXXA Fall from bed, initial encounter: Secondary | ICD-10-CM | POA: Diagnosis present

## 2013-03-15 DIAGNOSIS — M1990 Unspecified osteoarthritis, unspecified site: Secondary | ICD-10-CM | POA: Diagnosis present

## 2013-03-15 HISTORY — DX: Encounter for palliative care: Z51.5

## 2013-03-15 MED ORDER — ONDANSETRON HCL 4 MG/2ML IJ SOLN: 4.0000 mg | Freq: Four times a day (QID) | INTRAMUSCULAR | Status: DC | PRN

## 2013-03-15 MED ORDER — BUDESONIDE-FORMOTEROL FUMARATE 160-4.5 MCG/ACT IN AERO
2.0000 | INHALATION_SPRAY | Freq: Two times a day (BID) | RESPIRATORY_TRACT | Status: DC
  Filled 2013-03-15: qty 6

## 2013-03-15 MED ORDER — MORPHINE SULFATE 2 MG/ML IJ SOLN: 1.0000 mg | INTRAMUSCULAR | Status: DC | PRN

## 2013-03-15 MED ORDER — IPRATROPIUM-ALBUTEROL 20-100 MCG/ACT IN AERS
1.0000 | INHALATION_SPRAY | Freq: Four times a day (QID) | RESPIRATORY_TRACT | Status: DC | PRN
  Filled 2013-03-15: qty 4

## 2013-03-15 MED ORDER — PROCHLORPERAZINE 25 MG RE SUPP
25.0000 mg | Freq: Three times a day (TID) | RECTAL | Status: DC | PRN
  Filled 2013-03-15: qty 1

## 2013-03-15 MED ORDER — ALBUTEROL SULFATE (5 MG/ML) 0.5% IN NEBU: 2.5000 mg | INHALATION_SOLUTION | RESPIRATORY_TRACT | Status: DC | PRN

## 2013-03-15 MED ORDER — LORAZEPAM 2 MG/ML PO CONC: 1.0000 mg | Freq: Three times a day (TID) | ORAL | Status: DC

## 2013-03-15 MED ORDER — ACETAMINOPHEN 650 MG RE SUPP: 650.0000 mg | Freq: Four times a day (QID) | RECTAL | Status: DC | PRN

## 2013-03-15 MED ORDER — SODIUM CHLORIDE 0.9 % IV SOLN
INTRAVENOUS | Status: DC
  Administered 2013-03-15: 09:00:00 via INTRAVENOUS

## 2013-03-15 MED ORDER — ONDANSETRON HCL 4 MG PO TABS: 4.0000 mg | ORAL_TABLET | Freq: Four times a day (QID) | ORAL | Status: DC | PRN

## 2013-03-15 MED ORDER — FENTANYL 25 MCG/HR TD PT72
25.0000 ug | MEDICATED_PATCH | TRANSDERMAL | Status: DC
  Administered 2013-03-15: 25 ug via TRANSDERMAL
  Filled 2013-03-15: qty 1

## 2013-03-15 MED ORDER — ACETAMINOPHEN 325 MG PO TABS: 650.0000 mg | ORAL_TABLET | Freq: Four times a day (QID) | ORAL | Status: DC | PRN

## 2013-03-15 MED ORDER — MORPHINE SULFATE (CONCENTRATE) 10 MG /0.5 ML PO SOLN: 10.0000 mg | ORAL | Status: DC | PRN

## 2013-03-15 NOTE — Progress Notes (Signed)
[facility] ([facility]  Homecare SW visit. Related admission to Hospice diagnosis of pancreatic cancer. Patient asleep with no family in the room. Patient appeared comfortable. SW will assist with [facility] transfer if family desires and patient is able to transfer; [facility] has not been contacted about bed availability at this time. Please call [facility] at [phone] with any Hospice related questions or concerns. Vonna Kotyk, LCSW Ext. [phone]

## 2013-03-15 NOTE — Progress Notes (Signed)
Nutrition Brief Note  Chart reviewed. Pt now transitioning to comfort care.  No further nutrition interventions warranted at this time.   Oran Rein, RD, LDN Clinical Inpatient Dietitian Pager:  [phone] Weekend and after hours pager:  [phone]

## 2013-03-15 NOTE — Progress Notes (Signed)
PAC reaccess due today - husband requests site not be done due to patient terminal status. RN aware

## 2013-03-15 NOTE — ED Provider Notes (Signed)
CSN: [id number]     Arrival date & time 03/20/2013  0309 History     First MD Initiated Contact with Patient 03/14/2013 [phone]     Chief Complaint  Patient presents with  . comfort care    (Consider location/radiation/quality/duration/timing/severity/associated sxs/prior Treatment) HPI History provided by patient's husband.  Pt has terminal ovarian cancer, is on comfort care at home, and hospice nurse predicted that she had 24-72 hours to live 2 days ago.  Her husband reports that she had new onset confusion and mumbling speech upon waking 2 days ago.  Became agitated last night and was kicking in her bed, causing her to accidentally roll out early this morning.  Did not hit her head, but did hit her head in a fall 3 days ago.  He has not slept in days, is exhausted and is no longer able to care for her at home.   Past Medical History  Diagnosis Date  . History of breast cancer   . COPD (chronic obstructive pulmonary disease)   . GERD (gastroesophageal reflux disease)   . Diabetes mellitus     type II  . Hx of colonic polyps   . Osteoarthritis   . MVP (mitral valve prolapse)   . Dysrhythmia     pt states runs a little fast   . Shortness of breath     with exertion   . Depression   . Anxiety   . Heart murmur   . Stroke   . Ascites 10/22/2012  . metastases dx'd 10/2011  . Ovarian cancer dx'd2/2013    active chemotherapy  . Breast CA 1993  . Partial small bowel obstruction 01/2013  . Hospice care patient    Past Surgical History  Procedure Laterality Date  . Cesarean section  1980, 1982    X 2   . Laparoscopy      work up for infertility  . Tympanoplasty      left X 2  . Left breast fibroadenoma removal  1960's  . Breast cyst aspirations    . Left modified radial mastectomy      >20 years ago  . Laparotomy  10/29/2011    Procedure: EXPLORATORY LAPAROTOMY;  Surgeon: Rejeana Brock A. Duard Brady, MD;  [facility];  Service: Gynecology;  Laterality: N/A;  . Abdominal hysterectomy   10/29/2011    Procedure: HYSTERECTOMY ABDOMINAL;  Surgeon: Rejeana Brock A. Duard Brady, MD;  [facility];  Service: Gynecology;  Laterality: N/A;  Total abdominal hysterectomy  . Salpingoophorectomy  10/29/2011    Procedure: SALPINGO OOPHERECTOMY;  Surgeon: Rejeana Brock A. Duard Brady, MD;  [facility];  Service: Gynecology;  Laterality: Bilateral;  . Laparotomy N/A 02/26/2013    Procedure: EXPLORATORY LAPAROTOMY WITH GASTROSTOMY TUBE PLACEMENT ;  Surgeon: [name] Merino, MD;  [facility];  Service: General;  Laterality: N/A;  . Upper gi endoscopy  02/26/2013    Procedure: UPPER GI ENDOSCOPY;  Surgeon: [name] Merino, MD;  [facility];  Service: General;;   Family History  Problem Relation Age of Onset  . Cancer Mother     endometrial/ colon  . Cancer Brother     colon  . COPD Other   . Hypertension Other   . Cancer Maternal Uncle     prostate/bladder/lung  . Cancer Paternal Aunt     unknown abdominal cancer  . Cancer Maternal Grandmother     Salivary gland cancer  . Cancer Paternal Aunt     leukemia   History  Substance  Use Topics  . Smoking status: Current Some Day Smoker -- 0.50 packs/day for 30 years    Types: Cigarettes  . Smokeless tobacco: Never Used     Comment: quit again 2009; restarted 1 ppd 2011  . Alcohol Use: No   OB History   Grav Para Term Preterm Abortions TAB SAB Ect Mult Living                 Review of Systems  All other systems reviewed and are negative.    Allergies  Promethazine  Home Medications  No current outpatient prescriptions on file. BP 79/42  Pulse 145  Temp(Src) 102.9 F (39.4 C) (Axillary)  Resp 18  Ht 5\' 4"  (1.626 m)  Wt 123 lb (55.792 kg)  BMI 21.1 kg/m2  SpO2 92% Physical Exam  Nursing note and vitals reviewed. Constitutional: She appears well-developed. No distress.  Cachectic.  Sleeping.   HENT:  Head: Normocephalic and atraumatic.  Cardiovascular: Regular rhythm and intact distal pulses.   Tachycardic at ~125bmp   Pulmonary/Chest: Effort normal and breath sounds normal. No respiratory distress.  Abdominal:  Abd distended.  G-tube in place.  Drainage of foul-smelling, yellow-brown fluid from stoma.    Skin: Skin is warm and dry. No rash noted.    ED Course   Procedures (including critical care time)  Labs Reviewed - No data to display No results found. 1. Ovarian cancer, unspecified laterality   2. Agitation     MDM  72yo F on comfort care at home for terminal ovarian cancer brought to ED by her husband, who can no longer care for her since development of AMS and agitation over the weekend.  Pt asleep, in no respiratory distress, tachycardic on exam.  [facility] consulted for admission for continued comfort care.    Otilio Miu, PA-C [name] 24, 2013 2110

## 2013-03-15 NOTE — ED Notes (Signed)
ASSIGNED ROOM 17@0603 

## 2013-03-15 NOTE — Progress Notes (Signed)
Patient's eyes prepped for donor.

## 2013-03-15 NOTE — Progress Notes (Signed)
Pt lying in bed with eyes closed.  Husband at side.  Per spouse he has not really slept in days and is exhausted.  Daughters reported to have been at the hospital earlier and could not take watching their mother die. He reported that they said their goodbyes.  Hospital Attending spoke with Primary Caregiver about discharge disposition.   [name] (pt's spouse) reported that the pt did not want to go to [facility] and that both he and his daughters are physically and emotionally exhausted.  Per his report going home is not an option.   Writer spoke with Primary Caregiver about [facility] in detail.  Also explored Pt's physical condition at the time she made the statements about [facility].  Discussed other options including hired caregivers in the home.  [name] is now open to the possibility of the pt discharging to [facility] if she is unable to stay inpatient for End of Life Care.  Reinforced End of Life education.    Pt is currently under hospice for Ovarian Cancer.  This is a hospice related admission.  Please contact [facility] at [phone] with any Pt movements.  Elijah Birk RN, [facility] RN, [phone]

## 2013-03-15 NOTE — ED Notes (Signed)
Per EMS report: Pt from home. Pt has terminal ovarian cancer.  Pt was given 24-72hrs to live 24 hours ago.  Pt receiving comfort care by husband but feels he cannot provide further care d/t pt's increases agitation.  Pt had a fall prior to calling EMS. Pt's husband has been giving pt's ativan. EMS VS: BP: 116/78, HR: 132, RR: 28, 90% on 4L

## 2013-03-15 NOTE — Progress Notes (Addendum)
Chaplain consult at nursing referral.   Provided emotional support with pt's husband.  Engaged in extended anticipatory grief support, working to provide context for and normalize feelings, provide grief education.  Pt's husband overwhelmed with period of waiting.  Uncertain / fearful of what life will be after pt's death.    Will continue to follow for support Thank you for consult.    Belva Crome MDiv

## 2013-03-15 NOTE — ED Notes (Signed)
Bed:WA09<BR> Expected date:<BR> Expected time:<BR> Means of arrival:<BR> Comments:<BR> ems

## 2013-03-15 NOTE — Progress Notes (Signed)
Patient is pulseless and breathless. Unable to obtain vital signs. Notification in process to the physician and family. Erskine Emery, RN verified that patient is without signs of life.

## 2013-03-15 NOTE — Progress Notes (Signed)
Received report from night shift RN and assumed care of pt with family at bedside. Pt is minimally responsive at this time. Will continue with current plan of care.

## 2013-03-15 NOTE — Telephone Encounter (Signed)
Per staff phone call and POF I have schedueld appts.  JMW  

## 2013-03-15 NOTE — H&P (Signed)
[facility] History and Physical  [name] [name] WUJ:[id number] DOB: 1941-04-19 DOA: 03/25/2013  Referring physician: ER physician. PCP: Laurette Schimke, MD  Specialists: Dr.Magrinat. Oncologist.  Chief Complaint: Pain and agitation.  History obtained from patient's husband. Patient is presently minimally responsive.  HPI: [name] [name] is a 72 y.o. female history of metastatic ovarian cancer terminally ill was brought to the ER after patient was found to be increasingly agitated with pain as found by patient's husband. Patient husband states that over the last 2 days patient's condition has further worsened with patient becoming more bedbound. Last evening patient also fell out of the bed. At this time patient's husband feels that he would not be able to manage the patient home. Patient will be admitted for further pain management. Patient is a DO NOT RESUSCITATE and is on comfort measures only. No further labs will be ordered or aggressive measures executed as per the wishes of the family.  Review of Systems: As presented in the history of presenting illness, rest negative.  Past Medical History  Diagnosis Date  . History of breast cancer   . COPD (chronic obstructive pulmonary disease)   . GERD (gastroesophageal reflux disease)   . Diabetes mellitus     type II  . Hx of colonic polyps   . Osteoarthritis   . MVP (mitral valve prolapse)   . Dysrhythmia     pt states runs a little fast   . Shortness of breath     with exertion   . Depression   . Anxiety   . Heart murmur   . Stroke   . Ascites 10/22/2012  . metastases dx'd 10/2011  . Ovarian cancer dx'd2/2013    active chemotherapy  . Breast CA 1993  . Partial small bowel obstruction 01/2013   Past Surgical History  Procedure Laterality Date  . Cesarean section  1980, 1982    X 2   . Laparoscopy      work up for infertility  . Tympanoplasty      left X 2  . Left breast fibroadenoma removal  1960's  . Breast  cyst aspirations    . Left modified radial mastectomy      >20 years ago  . Laparotomy  10/29/2011    Procedure: EXPLORATORY LAPAROTOMY;  Surgeon: Rejeana Brock A. Duard Brady, MD;  Location: [facility];  Service: Gynecology;  Laterality: N/A;  . Abdominal hysterectomy  10/29/2011    Procedure: HYSTERECTOMY ABDOMINAL;  Surgeon: Rejeana Brock A. Duard Brady, MD;  [facility];  Service: Gynecology;  Laterality: N/A;  Total abdominal hysterectomy  . Salpingoophorectomy  10/29/2011    Procedure: SALPINGO OOPHERECTOMY;  Surgeon: Rejeana Brock A. Duard Brady, MD;  [facility];  Service: Gynecology;  Laterality: Bilateral;  . Laparotomy N/A 02/26/2013    Procedure: EXPLORATORY LAPAROTOMY WITH GASTROSTOMY TUBE PLACEMENT ;  Surgeon: [name] Merino, MD;  [facility];  Service: General;  Laterality: N/A;  . Upper gi endoscopy  02/26/2013    Procedure: UPPER GI ENDOSCOPY;  Surgeon: [name] Merino, MD;  Location: [facility];  Service: General;;   Social History:  reports that she has been smoking Cigarettes.  She has a 15 pack-year smoking history. She has never used smokeless tobacco. She reports that she does not drink alcohol or use illicit drugs. Home. where does patient live-- Cannot do ADLs. Can patient participate in ADLs?  Allergies  Allergen Reactions  . Promethazine     "Makes me feel like I want to jump out  of my skin"    Family History  Problem Relation Age of Onset  . Cancer Mother     endometrial/ colon  . Cancer Brother     colon  . COPD Other   . Hypertension Other   . Cancer Maternal Uncle     prostate/bladder/lung  . Cancer Paternal Aunt     unknown abdominal cancer  . Cancer Maternal Grandmother     Salivary gland cancer  . Cancer Paternal Aunt     leukemia      Prior to Admission medications   Medication Sig Start Date End Date Taking? Authorizing Provider  albuterol (PROVENTIL) (2.5 MG/3ML) 0.083% nebulizer solution Take 2.5 mg by nebulization every 6 (six) hours as needed for wheezing.   Yes  Historical Provider, MD  budesonide-formoterol (SYMBICORT) 160-4.5 MCG/ACT inhaler Inhale 2 puffs into the lungs 2 (two) times daily.   Yes Historical Provider, MD  fentaNYL (DURAGESIC - DOSED MCG/HR) 25 MCG/HR patch Place 1 patch (25 mcg total) onto the skin every 3 (three) days. 03/09/13  Yes Amy Allegra Grana, PA-C  Ipratropium-Albuterol (COMBIVENT RESPIMAT) 20-100 MCG/ACT AERS respimat Inhale 1 puff into the lungs every 6 (six) hours as needed for wheezing or shortness of breath.   Yes Historical Provider, MD  LORazepam (ATIVAN) 2 MG/ML concentrated solution Take 0.5 mLs (1 mg total) by mouth 4 (four) times daily - after meals and at bedtime. 03/02/13  Yes Lowella Dell, MD  Morphine Sulfate (MORPHINE CONCENTRATE) 10 mg / 0.5 ml concentrated solution Take 0.5 mLs (10 mg total) by mouth every hour as needed for pain. 03/03/13  Yes Lowella Dell, MD  ondansetron [facility]) 4 MG/2ML SOLN injection Inject 4 mg into the vein at bedtime. Pt gets through her port.   Yes Historical Provider, MD  PRESCRIPTION MEDICATION at bedtime. Home Paranatal Nutrition from [facility].   Yes Historical Provider, MD  prochlorperazine (COMPAZINE) 25 MG suppository Place 1 suppository (25 mg total) rectally every 8 (eight) hours as needed. 03/09/13  Yes Amy Allegra Grana, PA-C  sertraline (ZOLOFT) 20 MG/ML concentrated solution Take 7 mLs (140 mg total) by mouth daily. 03/09/13  Yes Amy G Berry, PA-C  UNABLE TO FIND Inject 30 Units into the vein Nightly. Med Name:  INSULIN IN TPN NIGHTLY 02/16/13  Yes Amy Allegra Grana, PA-C   Physical Exam: Filed Vitals:   03/20/2013 0317 03/12/2013 0445  BP: 88/57   Pulse: 128 123  Temp: 97.7 F (36.5 C)   TempSrc: Axillary   Resp: 20   Height: 5\' 4"  (1.626 m)   Weight: 54.432 kg (120 lb)   SpO2: 92% 96%     General:  Well-developed poorly nourished.  Eyes: Anicteric no pallor.  ENT: No discharge from the ears eyes nose mouth.  Neck: No mass felt.  Cardiovascular: S1-S2  heard.  Respiratory: No rhonchi or crepitations.  Abdomen: PEG tube seen. There is mild discharge around the PEG tube.  Skin: No rash.  Musculoskeletal: No edema.  Psychiatric: Patient is minimally responsive to painful stimuli.  Neurologic: Patient is minimally responsive to painful stimuli.  Labs on Admission:  Basic Metabolic Panel: No results found for this basename: NA, K, CL, CO2, GLUCOSE, BUN, CREATININE, CALCIUM, MG, PHOS,  in the last 168 hours Liver Function Tests: No results found for this basename: AST, ALT, ALKPHOS, BILITOT, PROT, ALBUMIN,  in the last 168 hours No results found for this basename: LIPASE, AMYLASE,  in the last 168 hours No results  found for this basename: AMMONIA,  in the last 168 hours CBC:  Recent Labs Lab 03/09/13 1041  WBC 9.5  NEUTROABS 7.1*  HGB 11.9  HCT 37.3  MCV 89.9  PLT 196   Cardiac Enzymes: No results found for this basename: CKTOTAL, CKMB, CKMBINDEX, TROPONINI,  in the last 168 hours  BNP (last 3 results) No results found for this basename: PROBNP,  in the last 8760 hours CBG: No results found for this basename: GLUCAP,  in the last 168 hours  Radiological Exams on Admission: No results found.  Assessment/Plan Principal Problem:   Ovarian cancer   1. Metastatic ovarian cancer. 2. History of diabetes mellitus2. 3. History of COPD.  Plan: At this time patient has been admitted for complete comfort care. Continue with patient's home dose of Roxanol and Ativan and in addition I have also ordered when necessary IV morphine. Consult hospice in a.m. No further aggressive measures or labs.    Code Status: DO NOT RESUSCITATE.  Family Communication: Patient's husband at the bedside.  Disposition Plan: Admit to inpatient.    Jevonte Clanton N. [facility] Pager [phone].  If 7PM-7AM, please contact night-coverage [email or web address] Password [facility] 03/08/2013, 5:42 AM

## 2013-03-15 NOTE — Progress Notes (Signed)
Patient is experiencing agonal breathing with period of apnea lasting 15 seconds. The patient's spouse has been called and made aware of the patient's condition and has declined to be at the bedside. He will continue to be updated.

## 2013-03-16 ENCOUNTER — Encounter (INDEPENDENT_AMBULATORY_CARE_PROVIDER_SITE_OTHER): Payer: Medicare Other | Admitting: Surgery

## 2013-03-17 ENCOUNTER — Other Ambulatory Visit: Payer: Medicare Other | Admitting: Lab

## 2013-03-17 ENCOUNTER — Ambulatory Visit: Payer: Medicare Other | Admitting: Physician Assistant

## 2013-03-17 ENCOUNTER — Ambulatory Visit: Payer: Medicare Other

## 2013-03-18 NOTE — ED Provider Notes (Signed)
Medical screening examination/treatment/procedure(s) were performed by non-physician practitioner and as supervising physician I was immediately available for consultation/collaboration.    Vida Roller, MD 03/18/13 [phone]

## 2013-03-22 ENCOUNTER — Other Ambulatory Visit: Payer: Self-pay | Admitting: Oncology

## 2013-03-30 ENCOUNTER — Other Ambulatory Visit: Payer: Medicare Other | Admitting: Lab

## 2013-03-30 ENCOUNTER — Ambulatory Visit: Payer: Medicare Other | Admitting: Physician Assistant

## 2013-03-30 ENCOUNTER — Ambulatory Visit: Payer: Medicare Other

## 2013-04-05 NOTE — Progress Notes (Signed)
Patient's remains picked up by [facility] [employer]

## 2013-04-05 NOTE — Discharge Summary (Signed)
Death Summary  ANWITA MENCER ZOX:[id number] DOB: 10-11-1940 DOA: March 25, 2013  PCP: Laurette Schimke, MD PCP/Office notified: no  Admit date: 2013/03/25 Date of Death: [name] 26, 2013  Final Diagnoses:  Principal Problem:   Ovarian cancer    This 72 yr old with end stage ovarian ca was admitted 03-25-2013 am with progressive failure to thrive and debility 2/2 to her oncological process.  She was unable to give h/o.  Patient husband states that over the last 2 days patient's condition has further worsened with patient becoming more bedbound. Last evening patient also fell out of the bed. At this time patient's husband feels that he would not be able to manage the patient home. Patient will be admitted for further pain management. Patient is a DO NOT RESUSCITATE and is on comfort measures only. No further labs will be ordered or aggressive measures executed as per the wishes of the family.  She was seen and monitored and Palliative medicine was ordered for symptom management and Hospice availability She was found to be apneic and sob and ultimately expired on 03-26-13 at around 21:00   Time: 7  Signed:  Rhetta Mura  [facility] March 26, 2013, 7:41 AM

## 2013-04-05 DEATH — deceased

## 2013-04-06 ENCOUNTER — Ambulatory Visit: Payer: Medicare Other | Admitting: Physician Assistant

## 2013-04-07 ENCOUNTER — Ambulatory Visit: Payer: Medicare Other

## 2013-04-07 ENCOUNTER — Ambulatory Visit: Payer: Medicare Other | Admitting: Oncology

## 2013-04-07 ENCOUNTER — Other Ambulatory Visit: Payer: Medicare Other | Admitting: Lab

## 2013-04-08 ENCOUNTER — Encounter (INDEPENDENT_AMBULATORY_CARE_PROVIDER_SITE_OTHER): Payer: Medicare Other | Admitting: Surgery

## 2013-04-09 ENCOUNTER — Encounter: Payer: Self-pay | Admitting: Oncology

## 2013-05-27 NOTE — Telephone Encounter (Signed)
No entry 

## 2013-12-17 ENCOUNTER — Telehealth: Payer: Self-pay | Admitting: *Deleted

## 2013-12-17 NOTE — Telephone Encounter (Signed)
Deceased's daughter called stating that she is wanting to come speak to a Dietitian.  I informed her that we could see her and got Gantt scheduled.

## 2014-03-06 IMAGING — XA IR PERITONEAL CATH REMOVAL (PERM)
1 series · 1 of 1 positions shown · non-contrast
Comparison: none

CLINICAL DATA: Ovarian cancer, malignant ascites, pleural drain no
longer required

[Series 1: care single · 1 of 1 slices shown]
[im 1/1]
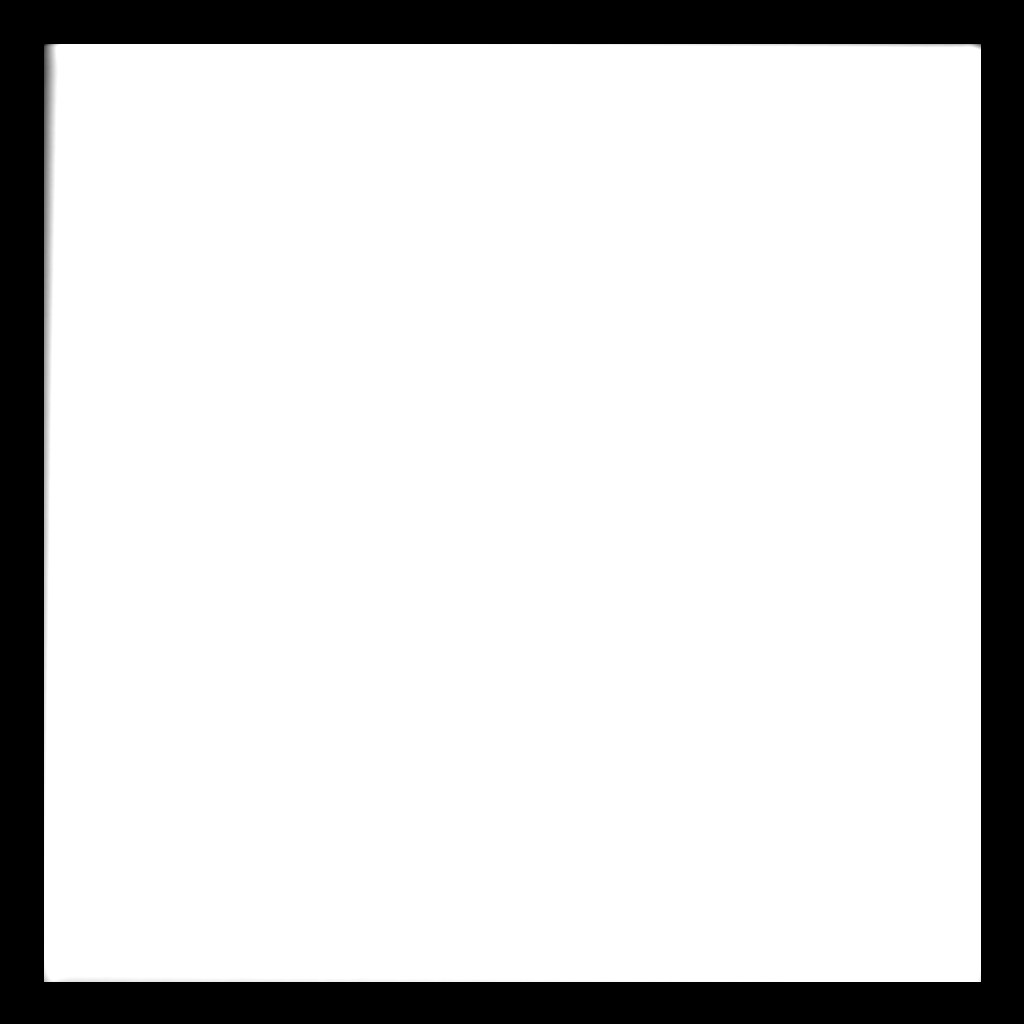

[1 of 1 positions shown; findings below may reference images not displayed]

RIGHT LOWER QUADRANT TUNNELED PLEURAL DRAIN REMOVAL

Date:  01/15/2013 [DATE]

Radiologist:  Farzin Penalver, M.D.

Medications:  1% lidocaine locally

Complications:  None.

PROCEDURE/FINDINGS:

Informed consent was obtained from the patient following
explanation of the procedure, risks, benefits and alternatives.
The patient understands, agrees and consents for the procedure.
All questions were addressed.  A time out was performed.

Maximal barrier sterile technique utilized including caps, mask,
sterile gowns, sterile gloves, large sterile drape, hand hygiene,
and Chloroprep.

1% lidocaine was injected under sterile conditions along the
subcutaneous tunnel.  The right lower quadrant tunneled pleural
drain (Aspira catheter)was removed utilizing blunt and sharp
dissection to release the retention cuff.  The catheter was removed
entirely.  Hemostasis was obtained  with compression.  No immediate
complication.  Sterile dressing applied.  The patient tolerated the
procedure well.
IMPRESSION: Successful removal of the right lower quadrant tunneled pleural
drain (Aspira catheter).

## 2014-03-29 ENCOUNTER — Other Ambulatory Visit: Payer: Self-pay | Admitting: *Deleted

## 2014-04-13 IMAGING — CR DG ABDOMEN 1V
2 series · 2 of 2 positions shown · non-contrast
Comparison: 02/19/2013

***ADDENDUM*** CREATED: 02/22/2013 [DATE]

For position of the nasogastric tube within the stomach, tube could
be advanced approximately 10 cm.
CLINICAL DATA: Abdominal pain, small bowel obstruction
ABDOMEN - 1 VIEW

[view not recorded (1 of 2)]
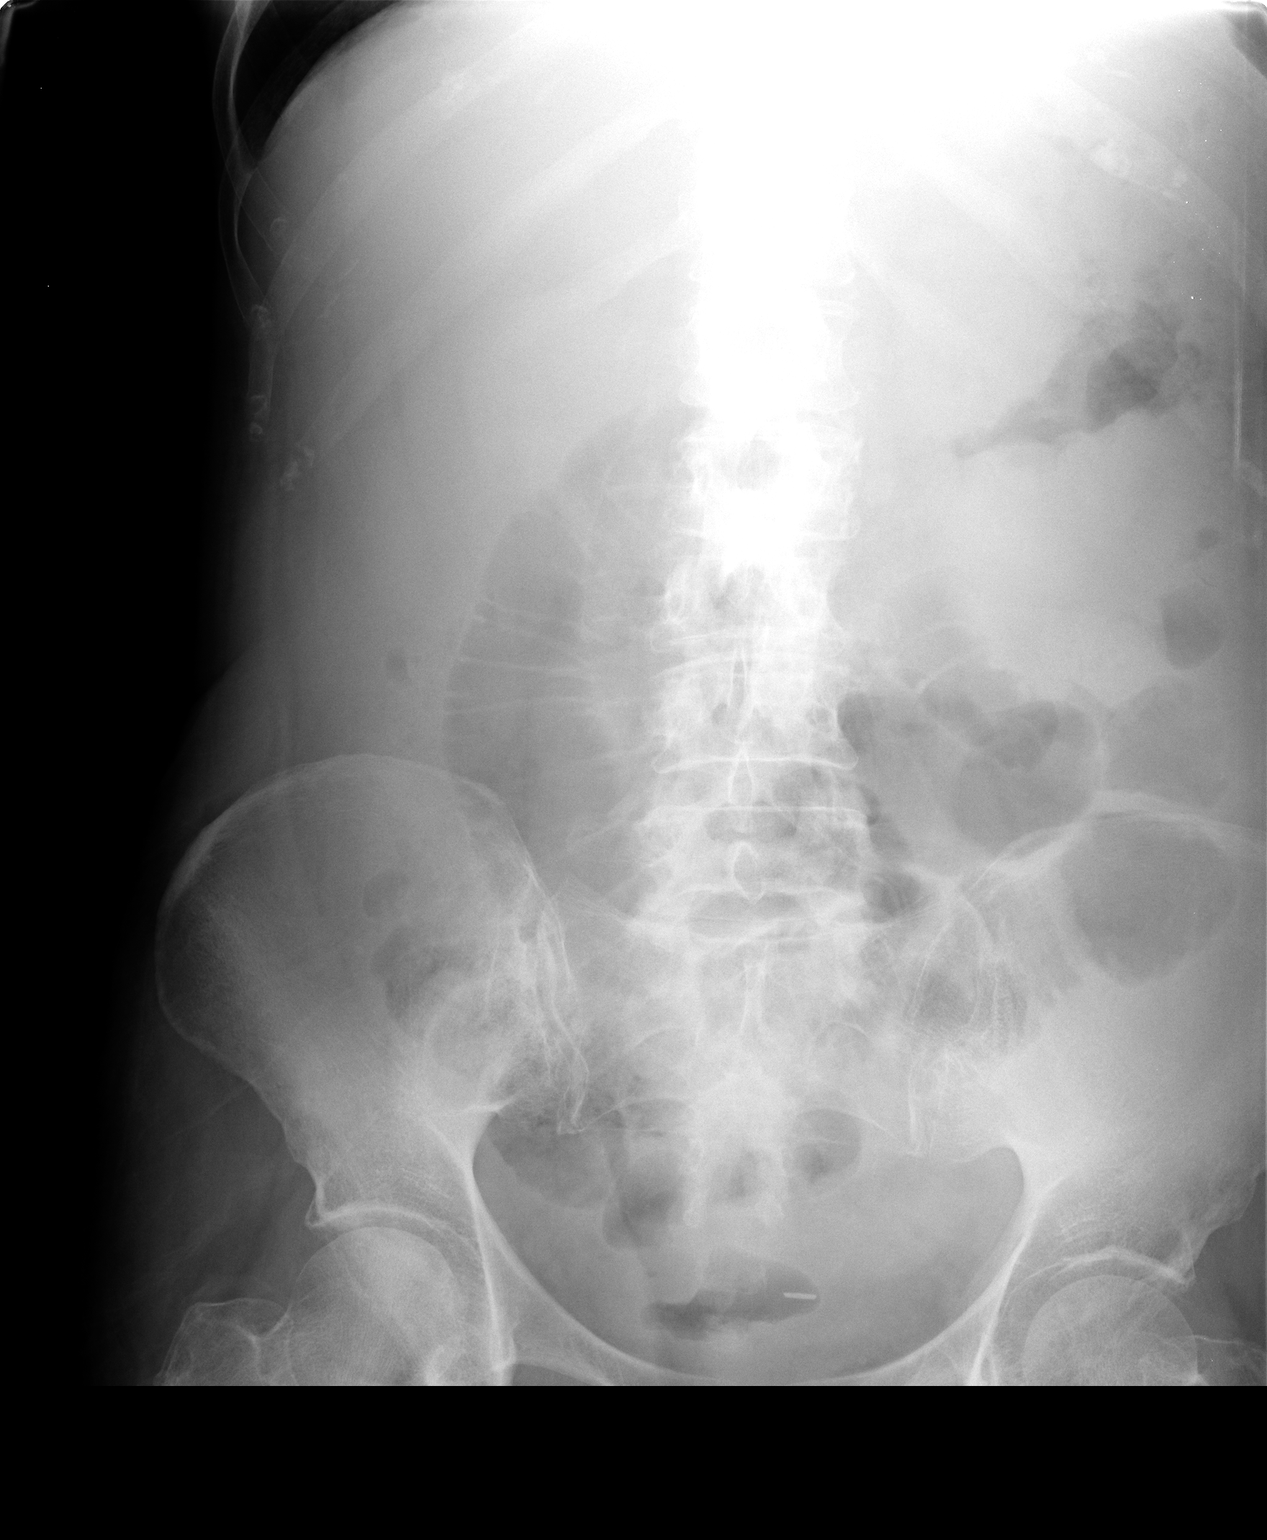

[view not recorded (2 of 2)]
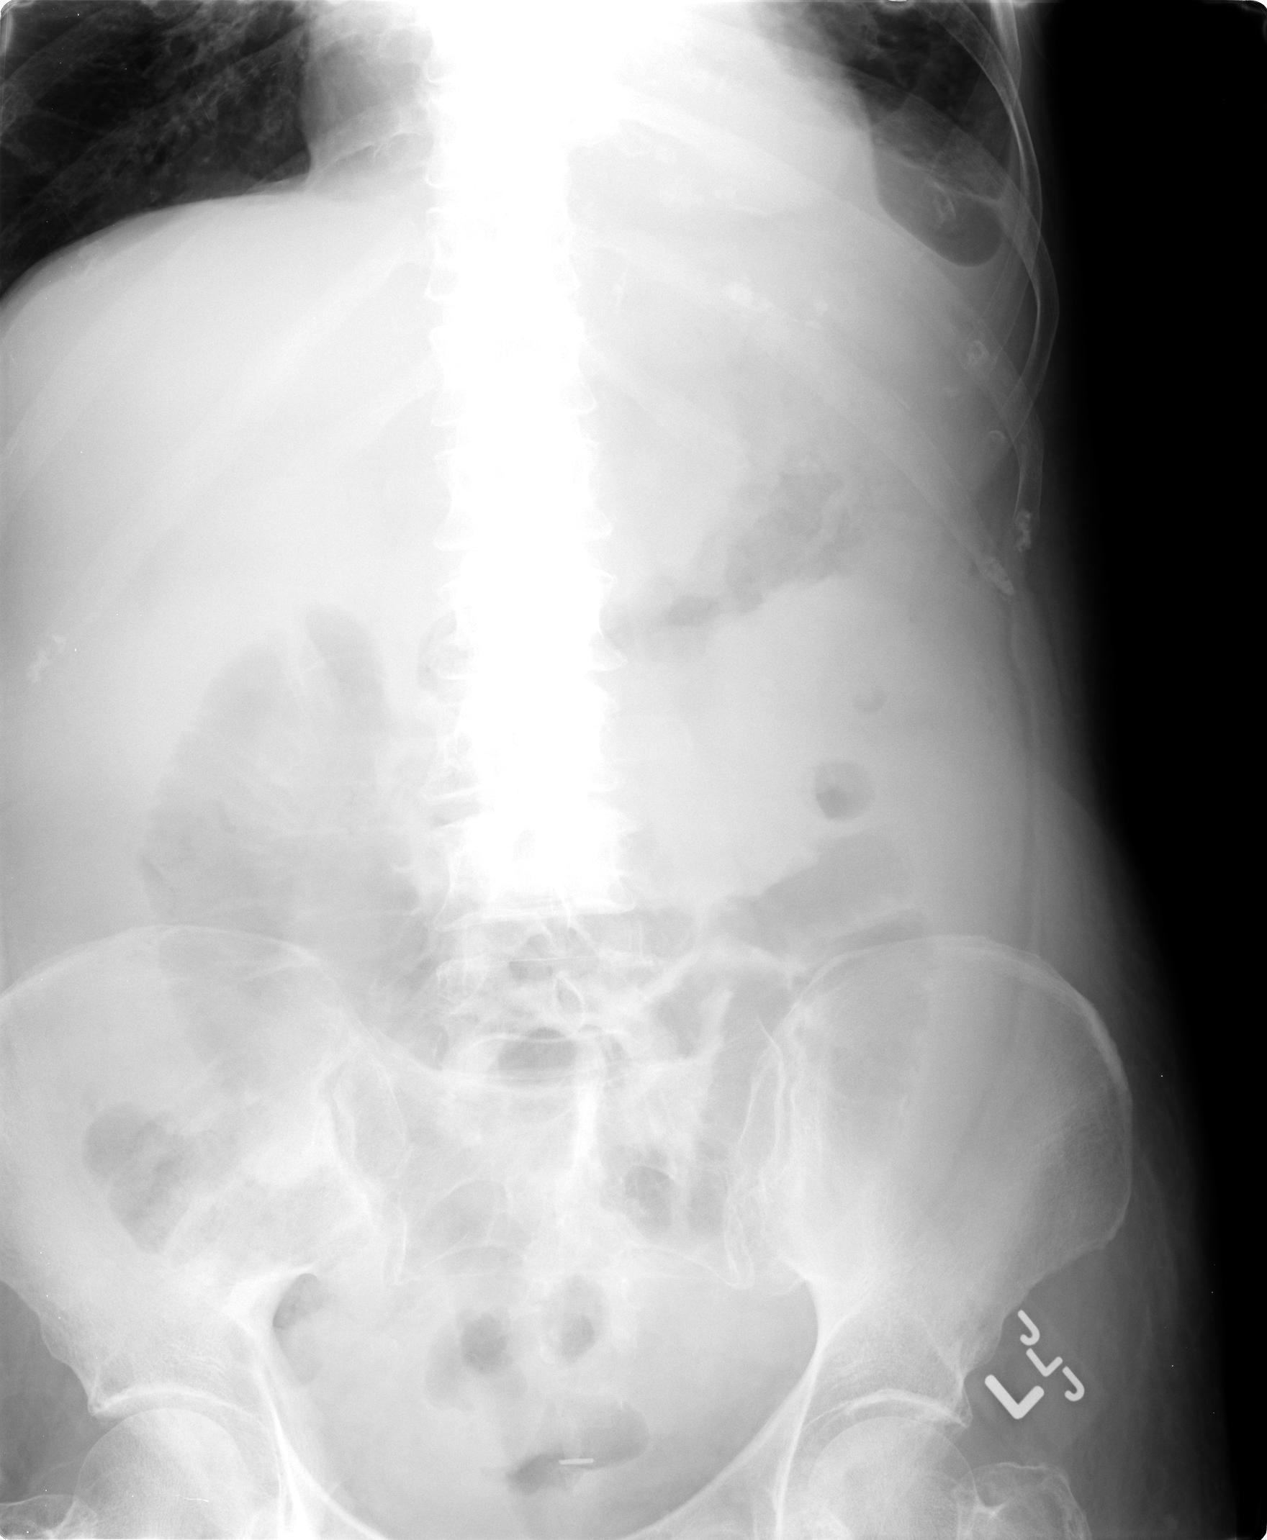

[2 of 2 positions shown; findings below may reference images not displayed]

FINDINGS: The nasogastric catheter is withdrawn somewhat and now
lies within the distal esophagus.  There remain dilated loops of
small bowel centrally within the abdomen. The maximum dimension is
approximate 4.6 cm.  Stomach appears mildly decompressed.  Minimal
colonic gas is noted.
IMPRESSION: Persistent small bowel obstruction.

Nasogastric catheter now lies within the distal esophagus.

## 2014-04-16 IMAGING — CR DG ABDOMEN 1V
1 series · 2 of 2 positions shown · non-contrast
Comparison: 02/24/2013.

CLINICAL DATA: History of small-bowel obstruction.  Distention of
abdomen.  Follow-up.

ABDOMEN - 1 VIEW

[Series 1: ap (kub) · U · 2 of 2 slices shown]
[im 1/2]
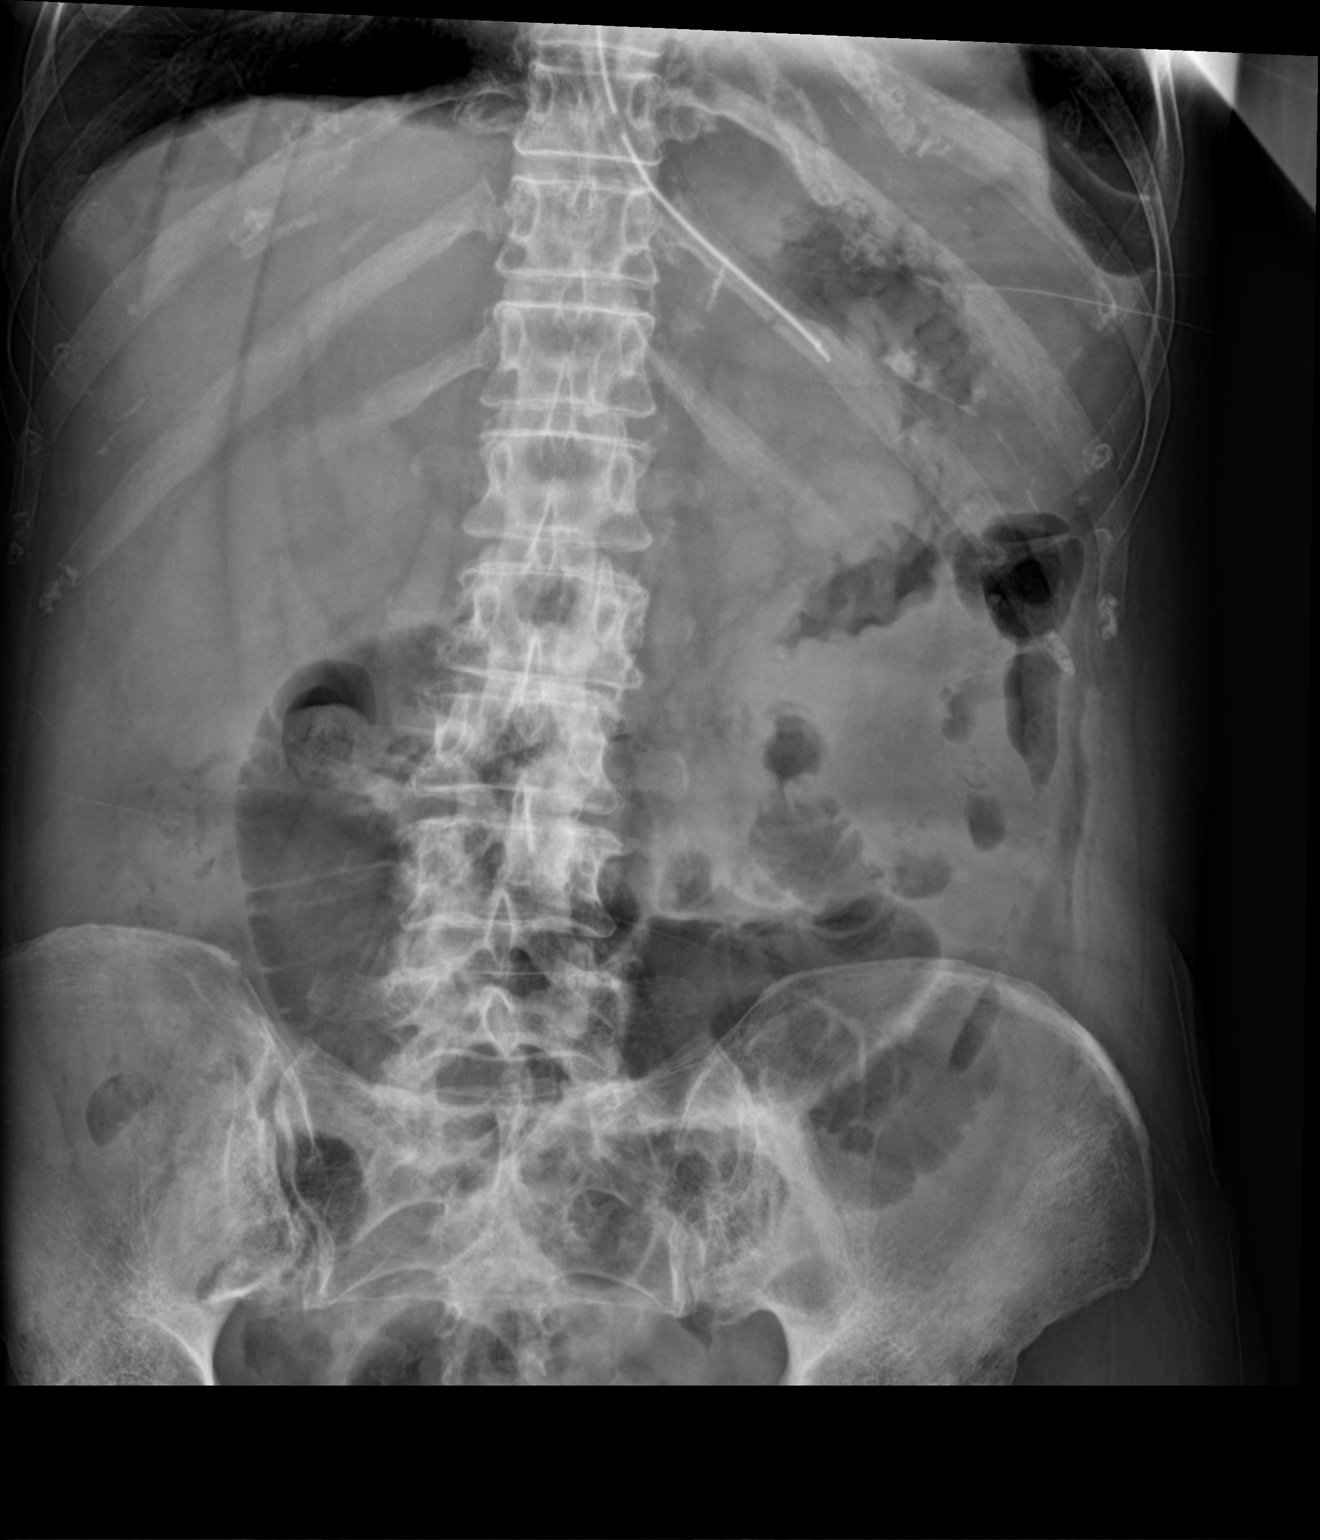
[im 2/2]
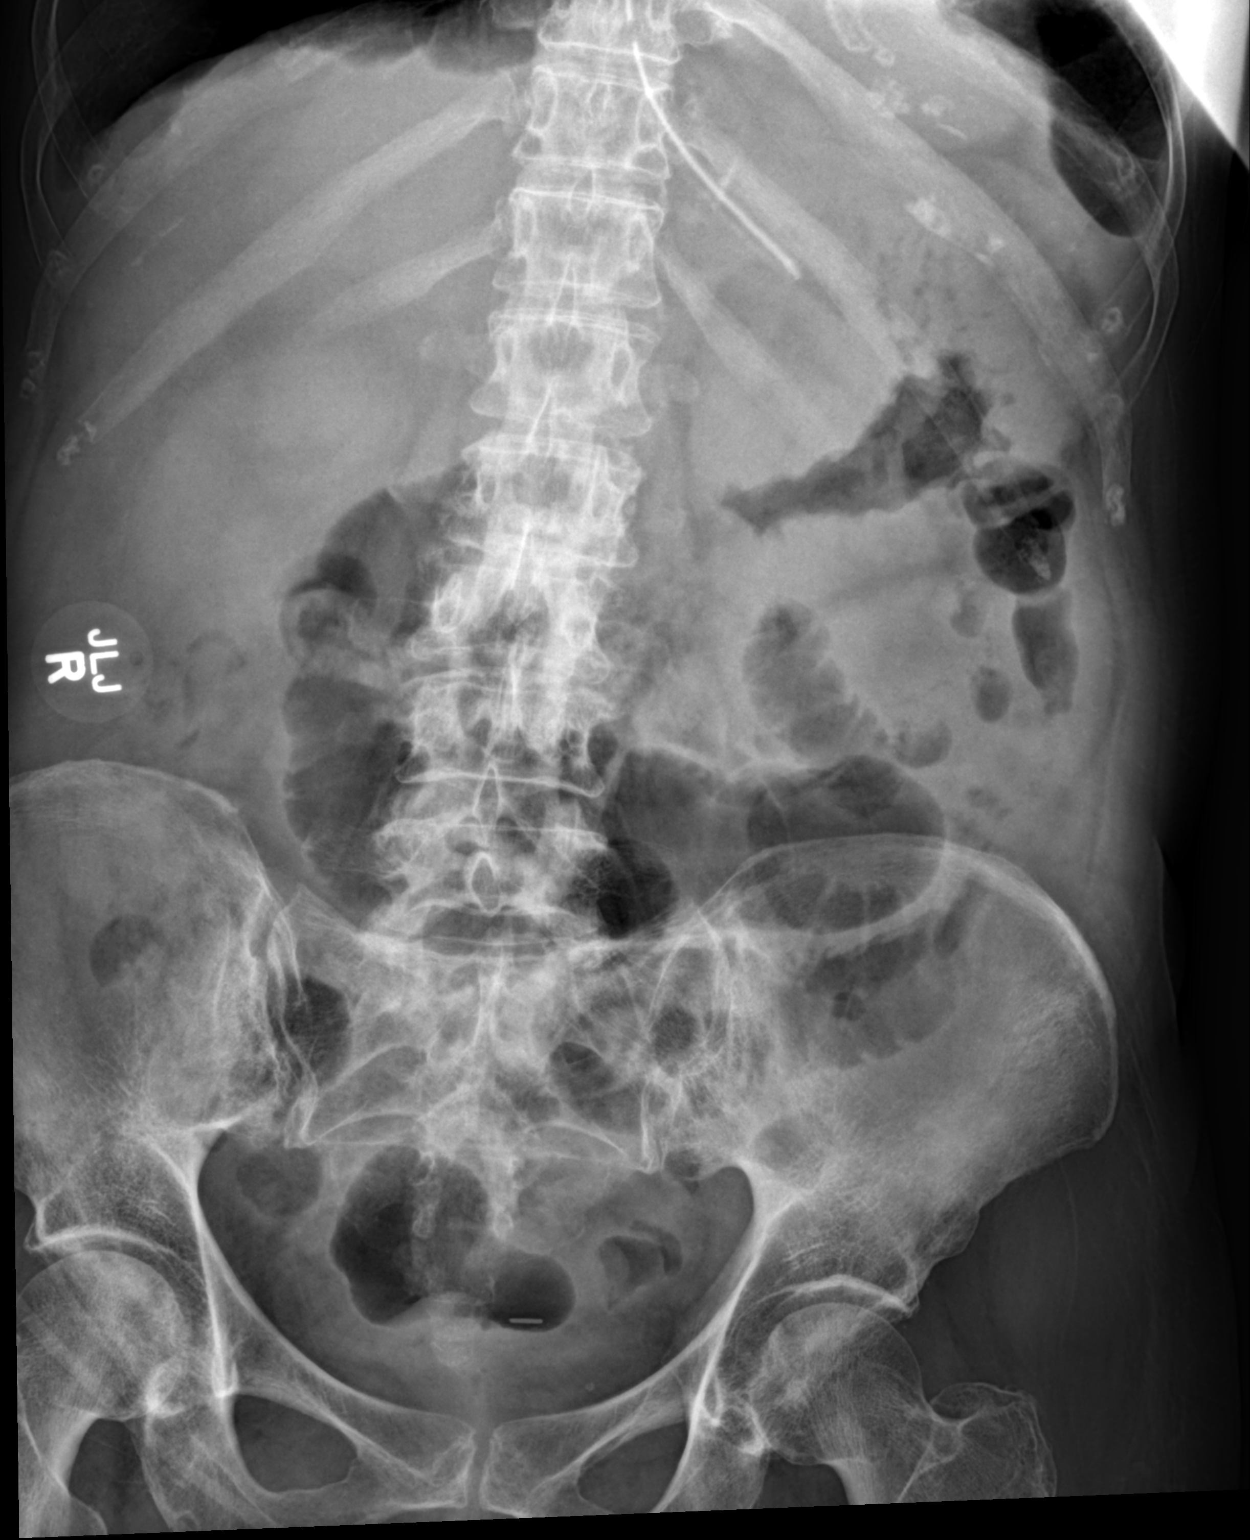

[2 of 2 positions shown; findings below may reference images not displayed]

FINDINGS: Tip of enteric tube is in the proximal area of the
stomach and could be advanced.  There is continued dilatation of
loops of small intestine in the abdomen.  The largest loop has a
greatest caliber of 5 cm which is slightly larger than on previous
study.  There is some colonic gas present.  No opaque calculi are
seen.  Pelvic phlebolith is present.  Surgical clip is seen in the
pelvis.  Changes of degenerative spondylosis are seen.
IMPRESSION: Tip of enteric tube is in the proximal area of the stomach and
could be advanced.  There is continued dilatation of loops of small
intestine in the abdomen.  The largest loop has a greatest caliber
of 5 cm which is slightly larger than on previous study.
